# Patient Record
Sex: Female | Born: 1950
Health system: Southern US, Community
[De-identification: ages and names within clinical notes are randomized; demographics above are authoritative.]

## PROBLEM LIST (undated history)

## (undated) DIAGNOSIS — F419 Anxiety disorder, unspecified: Secondary | ICD-10-CM

## (undated) DIAGNOSIS — G7000 Myasthenia gravis without (acute) exacerbation: Secondary | ICD-10-CM

## (undated) DIAGNOSIS — F329 Major depressive disorder, single episode, unspecified: Secondary | ICD-10-CM

## (undated) DIAGNOSIS — F32A Depression, unspecified: Secondary | ICD-10-CM

## (undated) DIAGNOSIS — K219 Gastro-esophageal reflux disease without esophagitis: Secondary | ICD-10-CM

## (undated) DIAGNOSIS — L259 Unspecified contact dermatitis, unspecified cause: Secondary | ICD-10-CM

## (undated) DIAGNOSIS — J449 Chronic obstructive pulmonary disease, unspecified: Secondary | ICD-10-CM

## (undated) DIAGNOSIS — I679 Cerebrovascular disease, unspecified: Secondary | ICD-10-CM

## (undated) DIAGNOSIS — G629 Polyneuropathy, unspecified: Secondary | ICD-10-CM

## (undated) DIAGNOSIS — M810 Age-related osteoporosis without current pathological fracture: Secondary | ICD-10-CM

## (undated) DIAGNOSIS — I639 Cerebral infarction, unspecified: Secondary | ICD-10-CM

## (undated) DIAGNOSIS — G20 Parkinson's disease: Secondary | ICD-10-CM

## (undated) DIAGNOSIS — G8929 Other chronic pain: Secondary | ICD-10-CM

## (undated) DIAGNOSIS — E785 Hyperlipidemia, unspecified: Secondary | ICD-10-CM

## (undated) DIAGNOSIS — H269 Unspecified cataract: Secondary | ICD-10-CM

## (undated) DIAGNOSIS — E559 Vitamin D deficiency, unspecified: Secondary | ICD-10-CM

## (undated) DIAGNOSIS — K5790 Diverticulosis of intestine, part unspecified, without perforation or abscess without bleeding: Secondary | ICD-10-CM

## (undated) HISTORY — DX: Age-related osteoporosis without current pathological fracture: M81.0

## (undated) HISTORY — DX: Other chronic pain: G89.29

## (undated) HISTORY — DX: Vitamin D deficiency, unspecified: E55.9

## (undated) HISTORY — DX: Anxiety disorder, unspecified: F41.9

## (undated) HISTORY — DX: Cerebrovascular disease, unspecified: I67.9

## (undated) HISTORY — DX: Unspecified cataract: H26.9

## (undated) HISTORY — DX: Gastro-esophageal reflux disease without esophagitis: K21.9

## (undated) HISTORY — DX: Hyperlipidemia, unspecified: E78.5

## (undated) HISTORY — DX: Major depressive disorder, single episode, unspecified: F32.9

## (undated) HISTORY — DX: Chronic obstructive pulmonary disease, unspecified: J44.9

## (undated) HISTORY — DX: Polyneuropathy, unspecified: G62.9

## (undated) HISTORY — PX: CERVICAL CONE BIOPSY: SUR198

## (undated) HISTORY — DX: Diverticulosis of intestine, part unspecified, without perforation or abscess without bleeding: K57.90

## (undated) HISTORY — DX: Myasthenia gravis without (acute) exacerbation: G70.00

## (undated) HISTORY — DX: Depression, unspecified: F32.A

---

## 1997-04-10 HISTORY — PX: OTHER SURGICAL HISTORY: SHX169

## 1998-04-10 HISTORY — PX: ABDOMINAL HYSTERECTOMY: SHX81

## 2000-01-16 ENCOUNTER — Ambulatory Visit (HOSPITAL_BASED_OUTPATIENT_CLINIC_OR_DEPARTMENT_OTHER): Admission: RE | Admit: 2000-01-16 | Discharge: 2000-01-16 | Payer: Self-pay | Admitting: Orthopedic Surgery

## 2000-11-26 ENCOUNTER — Ambulatory Visit (HOSPITAL_BASED_OUTPATIENT_CLINIC_OR_DEPARTMENT_OTHER): Admission: RE | Admit: 2000-11-26 | Discharge: 2000-11-26 | Payer: Self-pay | Admitting: Orthopedic Surgery

## 2001-04-10 HISTORY — PX: ROTATOR CUFF REPAIR: SHX139

## 2002-04-10 HISTORY — PX: OTHER SURGICAL HISTORY: SHX169

## 2004-02-07 ENCOUNTER — Other Ambulatory Visit: Payer: Self-pay

## 2004-02-08 ENCOUNTER — Other Ambulatory Visit: Payer: Self-pay

## 2004-02-08 ENCOUNTER — Inpatient Hospital Stay: Payer: Self-pay

## 2004-02-23 ENCOUNTER — Ambulatory Visit: Payer: Self-pay | Admitting: Pain Medicine

## 2004-03-07 ENCOUNTER — Ambulatory Visit: Payer: Self-pay | Admitting: Pain Medicine

## 2004-03-17 ENCOUNTER — Ambulatory Visit: Payer: Self-pay | Admitting: Pain Medicine

## 2005-09-12 ENCOUNTER — Ambulatory Visit: Payer: Self-pay

## 2005-11-17 ENCOUNTER — Ambulatory Visit: Payer: Self-pay | Admitting: Unknown Physician Specialty

## 2005-12-28 ENCOUNTER — Ambulatory Visit: Payer: Self-pay | Admitting: Unknown Physician Specialty

## 2006-08-22 ENCOUNTER — Ambulatory Visit: Payer: Self-pay | Admitting: Specialist

## 2006-09-02 ENCOUNTER — Emergency Department: Payer: Self-pay | Admitting: Emergency Medicine

## 2006-11-21 ENCOUNTER — Ambulatory Visit: Payer: Self-pay | Admitting: Specialist

## 2007-03-02 ENCOUNTER — Ambulatory Visit: Payer: Self-pay | Admitting: Family Medicine

## 2007-05-09 ENCOUNTER — Ambulatory Visit: Payer: Self-pay | Admitting: Unknown Physician Specialty

## 2008-09-30 ENCOUNTER — Ambulatory Visit: Payer: Self-pay | Admitting: Family Medicine

## 2008-10-13 ENCOUNTER — Ambulatory Visit: Payer: Self-pay | Admitting: Family Medicine

## 2009-10-05 ENCOUNTER — Ambulatory Visit: Payer: Self-pay

## 2009-10-05 LAB — HM MAMMOGRAPHY: HM MAMMO: NEGATIVE

## 2010-01-03 ENCOUNTER — Ambulatory Visit: Payer: Self-pay | Admitting: Family Medicine

## 2010-09-25 ENCOUNTER — Emergency Department: Payer: Self-pay | Admitting: Emergency Medicine

## 2010-10-21 LAB — HM COLONOSCOPY

## 2010-10-31 ENCOUNTER — Ambulatory Visit: Payer: Self-pay | Admitting: Unknown Physician Specialty

## 2010-10-31 DIAGNOSIS — K5790 Diverticulosis of intestine, part unspecified, without perforation or abscess without bleeding: Secondary | ICD-10-CM

## 2010-10-31 HISTORY — DX: Diverticulosis of intestine, part unspecified, without perforation or abscess without bleeding: K57.90

## 2011-01-19 ENCOUNTER — Ambulatory Visit: Payer: Self-pay | Admitting: Family Medicine

## 2012-01-29 ENCOUNTER — Ambulatory Visit: Payer: Self-pay | Admitting: Family Medicine

## 2012-07-01 ENCOUNTER — Ambulatory Visit: Payer: Self-pay | Admitting: Family Medicine

## 2012-07-24 ENCOUNTER — Ambulatory Visit: Payer: Self-pay | Admitting: Family Medicine

## 2012-08-06 ENCOUNTER — Encounter: Payer: Self-pay | Admitting: Family Medicine

## 2012-08-08 ENCOUNTER — Encounter: Payer: Self-pay | Admitting: Family Medicine

## 2012-09-08 ENCOUNTER — Encounter: Payer: Self-pay | Admitting: Family Medicine

## 2012-10-08 ENCOUNTER — Encounter: Payer: Self-pay | Admitting: Family Medicine

## 2012-12-27 DIAGNOSIS — J449 Chronic obstructive pulmonary disease, unspecified: Secondary | ICD-10-CM | POA: Insufficient documentation

## 2012-12-27 LAB — LIPID PANEL
Cholesterol: 134 mg/dL (ref 0–200)
HDL: 22 mg/dL — AB (ref 35–70)
LDL Cholesterol: 79 mg/dL
TRIGLYCERIDES: 166 mg/dL — AB (ref 40–160)

## 2013-01-23 ENCOUNTER — Ambulatory Visit: Payer: Self-pay | Admitting: Family Medicine

## 2013-01-23 LAB — HM DEXA SCAN

## 2013-02-08 DIAGNOSIS — G7000 Myasthenia gravis without (acute) exacerbation: Secondary | ICD-10-CM

## 2013-02-08 HISTORY — DX: Myasthenia gravis without (acute) exacerbation: G70.00

## 2013-05-13 ENCOUNTER — Observation Stay: Payer: Self-pay | Admitting: Internal Medicine

## 2013-05-13 LAB — COMPREHENSIVE METABOLIC PANEL
ALK PHOS: 91 U/L
ALT: 22 U/L (ref 12–78)
AST: 28 U/L (ref 15–37)
Albumin: 3.8 g/dL (ref 3.4–5.0)
Anion Gap: 4 — ABNORMAL LOW (ref 7–16)
BILIRUBIN TOTAL: 0.5 mg/dL (ref 0.2–1.0)
BUN: 6 mg/dL — AB (ref 7–18)
CO2: 28 mmol/L (ref 21–32)
CREATININE: 0.79 mg/dL (ref 0.60–1.30)
Calcium, Total: 8.9 mg/dL (ref 8.5–10.1)
Chloride: 106 mmol/L (ref 98–107)
EGFR (Non-African Amer.): >60
Glucose: 92 mg/dL (ref 65–99)
OSMOLALITY: 273 (ref 275–301)
Potassium: 4.5 mmol/L (ref 3.5–5.1)
Sodium: 138 mmol/L (ref 136–145)
TOTAL PROTEIN: 7.1 g/dL (ref 6.4–8.2)

## 2013-05-13 LAB — CBC WITH DIFFERENTIAL/PLATELET
BASOS PCT: 0.9 %
Basophil #: 0 10*3/uL (ref 0.0–0.1)
EOS ABS: 0.4 10*3/uL (ref 0.0–0.7)
EOS PCT: 7.8 %
HCT: 38.8 % (ref 35.0–47.0)
HGB: 13 g/dL (ref 12.0–16.0)
Lymphocyte #: 2.1 10*3/uL (ref 1.0–3.6)
Lymphocyte %: 38.6 %
MCH: 33.3 pg (ref 26.0–34.0)
MCHC: 33.5 g/dL (ref 32.0–36.0)
MCV: 99 fL (ref 80–100)
Monocyte #: 0.5 x10 3/mm (ref 0.2–0.9)
Monocyte %: 8.5 %
NEUTROS ABS: 2.4 10*3/uL (ref 1.4–6.5)
Neutrophil %: 44.2 %
PLATELETS: 238 10*3/uL (ref 150–440)
RBC: 3.92 10*6/uL (ref 3.80–5.20)
RDW: 14.6 % — ABNORMAL HIGH (ref 11.5–14.5)
WBC: 5.3 10*3/uL (ref 3.6–11.0)

## 2013-05-13 LAB — TROPONIN I: Troponin-I: <0.02 ng/mL

## 2013-05-14 LAB — URINALYSIS, COMPLETE
BILIRUBIN, UR: NEGATIVE
BLOOD: NEGATIVE
Bacteria: NONE SEEN
Glucose,UR: NEGATIVE mg/dL (ref 0–75)
KETONE: NEGATIVE
Leukocyte Esterase: NEGATIVE
Nitrite: NEGATIVE
Ph: 7 (ref 4.5–8.0)
Protein: NEGATIVE
Specific Gravity: 1.005 (ref 1.003–1.030)
Squamous Epithelial: 1
WBC UR: <1 /HPF (ref 0–5)

## 2013-05-14 LAB — LIPID PANEL
CHOLESTEROL: 138 mg/dL (ref 0–200)
HDL Cholesterol: 36 mg/dL — ABNORMAL LOW (ref 40–60)
Ldl Cholesterol, Calc: 85 mg/dL (ref 0–100)
Triglycerides: 84 mg/dL (ref 0–200)
VLDL Cholesterol, Calc: 17 mg/dL (ref 5–40)

## 2013-07-31 DIAGNOSIS — F419 Anxiety disorder, unspecified: Secondary | ICD-10-CM | POA: Insufficient documentation

## 2013-07-31 DIAGNOSIS — E559 Vitamin D deficiency, unspecified: Secondary | ICD-10-CM | POA: Insufficient documentation

## 2013-07-31 DIAGNOSIS — M545 Low back pain, unspecified: Secondary | ICD-10-CM | POA: Insufficient documentation

## 2013-11-12 ENCOUNTER — Ambulatory Visit: Payer: Self-pay | Admitting: Family Medicine

## 2014-01-19 ENCOUNTER — Ambulatory Visit: Payer: Self-pay | Admitting: Neurology

## 2014-07-21 DIAGNOSIS — R634 Abnormal weight loss: Secondary | ICD-10-CM | POA: Diagnosis not present

## 2014-07-21 DIAGNOSIS — M810 Age-related osteoporosis without current pathological fracture: Secondary | ICD-10-CM | POA: Diagnosis not present

## 2014-07-21 DIAGNOSIS — E559 Vitamin D deficiency, unspecified: Secondary | ICD-10-CM | POA: Diagnosis not present

## 2014-07-23 ENCOUNTER — Ambulatory Visit
Admit: 2014-07-23 | Disposition: A | Discharge status: Home / Self Care | Payer: Self-pay | Attending: Family Medicine | Admitting: Family Medicine

## 2014-07-23 DIAGNOSIS — M8580 Other specified disorders of bone density and structure, unspecified site: Secondary | ICD-10-CM | POA: Diagnosis not present

## 2014-07-23 DIAGNOSIS — M549 Dorsalgia, unspecified: Secondary | ICD-10-CM | POA: Diagnosis not present

## 2014-07-23 DIAGNOSIS — R296 Repeated falls: Secondary | ICD-10-CM | POA: Diagnosis not present

## 2014-07-23 DIAGNOSIS — E559 Vitamin D deficiency, unspecified: Secondary | ICD-10-CM | POA: Diagnosis not present

## 2014-07-23 DIAGNOSIS — R634 Abnormal weight loss: Secondary | ICD-10-CM | POA: Diagnosis not present

## 2014-07-23 DIAGNOSIS — M40204 Unspecified kyphosis, thoracic region: Secondary | ICD-10-CM | POA: Diagnosis not present

## 2014-07-23 LAB — BASIC METABOLIC PANEL
BUN: 7 mg/dL (ref 4–21)
CREATININE: 0.7 mg/dL (ref 0.5–1.1)
Glucose: 88 mg/dL
Potassium: 4.2 mmol/L (ref 3.4–5.3)
SODIUM: 140 mmol/L (ref 137–147)

## 2014-07-23 LAB — CBC AND DIFFERENTIAL
HEMATOCRIT: 32 % — AB (ref 36–46)
Hemoglobin: 11.1 g/dL — AB (ref 12.0–16.0)
Platelets: 219 10*3/uL (ref 150–399)
WBC: 3.1 10^3/mL

## 2014-07-23 LAB — TSH: TSH: 1.4 u[IU]/mL (ref 0.41–5.90)

## 2014-07-23 LAB — HEPATIC FUNCTION PANEL
ALT: 5 U/L — AB (ref 7–35)
AST: 10 U/L — AB (ref 13–35)

## 2014-08-01 NOTE — Discharge Summary (Signed)
PATIENT NAME:  Alexandria Rodriguez, Alexandria Rodriguez MR#:  [id number] DATE OF BIRTH:  September 23, 1950  DATE OF ADMISSION:  05/13/2013 DATE OF DISCHARGE:  05/14/2013  ADMITTING PHYSICIAN: Aaron Mose. Hower, MD  DISCHARGING PHYSICIAN: Gladstone Lighter, MD   PRIMARY CARE PHYSICIAN: [place] Caryn Section, MD  [place]: None.   DISCHARGE DIAGNOSES: 1.  Right-sided headache with fascial paresthesias, could be migraine versus cervicalgia. 2.  Osteoporosis.  3.  Depression.  4.  Degenerative disk disease with chronic pain.  5.  Peripheral neuropathy, following with Dr. Jennings Books as an outpatient.  6.  Old lacunar infarct noted on MRI brain.    DISCHARGE MEDICATIONS: 1.  Aspirin 81 mg p.o. daily.  2.  Soma 325 mg p.o. b.i.d.  3.  Prilosec 40 mg p.o. daily.  4.  Diclofenac sodium 50 mg p.o. daily.  5.  Triamcinolone 0.05% topical ointment twice a day as needed.  6.  Naproxen 500 mg p.o. b.i.d.  7.  Lyrica 75 mg p.o. b.i.d.  8.  Bupropion 100 mg extended-release tablet twice a day.  9.  Atorvastatin 20 mg p.o. daily.   DISCHARGE DIET: Regular diet.   DISCHARGE ACTIVITY: As tolerated.    FOLLOWUP INSTRUCTIONS:  Follow up with Dr. Jennings Books in 3 to 4 weeks.   LABORATORY, DIAGNOSTIC AND RADIOLOGICAL DATA PRIOR TO DISCHARGE:  1.  Urinalysis negative for any infection.  2.  WBC 5.3, hemoglobin 13.0, hematocrit 38.8, platelet count 238.  3.  Sodium 138, potassium 4.5, chloride 106, bicarb 28, BUN 6, creatinine 0.79, glucose 92 and calcium of 8.9.  4.  ALT 22, AST 28, alk phos 91 total bili 0.5 and albumin of 3.8.  5.  Troponins negative.  6.  CT of the head without contrast showing no acute intracranial abnormality, old lacunar infarct in left basal ganglia.  7.  LDL cholesterol 85, total cholesterol 138, HDL 36, triglycerides 84.  8.  MRI of the brain showing normal exam, no acute infarction or other findings noted.  9.  Ultrasound Dopplers of carotid arteries showing less than 50% stenosis in  carotid arteries. 10.  Echo Doppler showing LV ejection fraction of 50% to 55%, normal LV systolic function noted.   BRIEF HOSPITAL COURSE: Ms. Akamine is a 64 year old Caucasian female with past medical history significant for multiple musculoskeletal pains including back pain, cervicalgia, peripheral neuropathy on pain medication and muscle relaxants, presented to the hospital secondary to headache, right-sided with right-sided paresthesias of to face.  1.  Headache, could be migraine headache. The patient had a similar headache on the left side a few weeks back, now comes with a right-sided headache and only facial symptoms. She had tingling and numbness along the right side of the face but no arm tingling, numbness over the leg or weakness noted. She has had migraine history in the past but denies any photophobia or nausea at this time. Her CT head showed an old infarct so she was at risk for stroke with her risk factors being her age, hypertension and her smoking. She was admitted to rule out TIA and the MRI of the brain was normal.  Her carotid Dopplers did not show any significant stenosis. Echo was normal. The patient is being discharged on low-dose aspirin and statin in light of her old infarct seen on the CT scan. She follows with Dr. Jennings Books for her neuropathies and was advised to follow up with him in the next 3 to 4 weeks.  2.  Cervicalgia  and degenerative disk disease with musculoskeletal pain. All her home medications have been continued without any changes.  3.  Depression. She is on bupropion.   Her course has been otherwise unremarkable in the hospital.   DISCHARGE CONDITION: Stable.   DISCHARGE DISPOSITION: Home.   TIME SPENT ON DISCHARGE: 45 minutes.   ____________________________ Gladstone Lighter, MD rk:cs D: 05/14/2013 14:16:52 ET T: 05/14/2013 15:48:20 ET JOB#: [id number]  cc: Gladstone Lighter, MD, <Dictator> Hemang K. Manuella Ghazi, MD Gladstone Lighter MD ELECTRONICALLY  SIGNED 05/15/2013 14:59

## 2014-08-01 NOTE — H&P (Signed)
PATIENT NAME:  Alexandria Rodriguez, LOCASCIO MR#:  [id number] DATE OF BIRTH:  17-May-1950  DATE OF ADMISSION:  05/13/2013  PRIMARY CARE PHYSICIAN: Dr. Lelon Huh.   REFERRING PHYSICIAN: Dr. Mariea Clonts.   CHIEF COMPLAINT: Facial droop.   HISTORY OF PRESENT ILLNESS: A 64 year old Caucasian female with past medical history of depression as well as tobacco abuse, presenting with headache and facial droop. Describes acute onset around 3 p.m. of right-sided facial droop with associated right-sided weakness and expressive aphasia. She also noted having a headache at that time, right frontal in location, throbbing, nonradiating to the neck. No worsening or relieving factors. No associated vision changes. Upon arrival to the Emergency Department, her symptoms as far as facial droop have resolved as well as her aphasia; however, she is still complaining of mild right-sided weakness, though improving. Of note, she does complain of daily headaches but states this one was somewhat more severe than usual. Currently complaining only of headache.   REVIEW OF SYSTEMS:  CONSTITUTIONAL: Denies fever, chills, fatigue, EYES: Denies blurry vision, double vision, eye pain.  EARS, NOSE, THROAT: Denies tinnitus, ear pain, hearing loss.  RESPIRATORY: Denies cough, wheeze, shortness of breath.  CARDIOVASCULAR: Denies chest pain, palpitations, edema.  GENITOURINARY: Denies nausea, vomiting, diarrhea or abdominal pain.  GENITOURINARY: Denies dysuria or hematuria.  ENDOCRINE: Denies nocturia or thyroid problems.  HEMATOLOGIC AND LYMPHATIC: Denies easy bruising, bleeding.  SKIN: Denies rash or lesions.  MUSCULOSKELETAL: Positive for pain in neck which is chronic. Denies back, shoulder, knee, hip pain or arthritic symptoms.  NEUROLOGIC: Positive for right-sided weakness as described above as well as expressive aphasia, though now resolved. Positive for tremor which is chronic.  PSYCHIATRIC: Denies anxiety or depressive symptoms.    Otherwise, full review of systems performed by me is negative.   PAST MEDICAL HISTORY: Osteoporosis, depression, as well as various orthopedic measures, including back surgery with cervical fusion, surgeries of both shoulders bilaterally.   SOCIAL HISTORY: Every day smoker. Denies any alcohol usage.   FAMILY HISTORY: Positive for CVAs, coronary artery disease and colon cancer.   ALLERGIES: ERYTHROMYCIN, LIDODERM, MORPHINE, SULFA DRUGS AND ULTRAM.    HOME MEDICATIONS: Include diclofenac 50 mg p.o. daily, naproxen 500 mg p.o. b.i.d., Lyrica 75 mg p.o. b.i.d., triamcinolone topical ointment 0.05% to affected areas b.i.d., carisoprodol 350 mg p.o. b.i.d., Prilosec 40 mg p.o. daily, Wellbutrin 100 mg p.o. b.i.d.   PHYSICAL EXAMINATION:  VITAL SIGNS: Temperature 98.2, heart rate 86, respirations 20, blood pressure 165/75, saturating 96% on room air. Weight 59 kg, BMI 23.8.  GENERAL: Well-nourished, well-developed, Caucasian female, currently in no acute distress.  HEAD: Normocephalic, atraumatic.  EYES: Pupils equal, round and reactive to light. Extraocular muscles intact. No scleral icterus.  MOUTH: Moist mucosal membranes. Dentition intact. No abscess noted.  EARS, NOSE, THROAT: Throat is clear without exudates. No external lesions.  NECK: Supple. No thyromegaly. No nodules. No JVD.  PULMONARY: Clear to auscultation bilaterally with no wheezes, rubs, rhonchi. No use of accessory muscles. Good respiratory effort.  CHEST: Nontender to palpation.  CARDIOVASCULAR: S1, S2, regular rate and rhythm. No murmurs, rubs or gallops. No edema. Pedal pulses 2+ bilaterally.  GASTROINTESTINAL: Soft, nontender, nondistended. No masses. Positive bowel sounds. No hepatosplenomegaly.  MUSCULOSKELETAL: No swelling, clubbing, edema. Range of motion full in all extremities.  NEUROLOGIC: Cranial nerves II through XII intact. Sensation intact. Reflexes intact; however, strength is abnormal. Right hand grip 4 out of  5 in comparison to the left as well as proximal right upper extremity  extension 4 out of 5 when compared to the left. Right lower extremity 4 out of 5 in proximal flexion as well as dorsiflexion. Pronator drift within normal limits. The patient does have an intention tremor which is chronic, most prominent on the left side.  SKIN: No ulcerations, lesions, rash, cyanosis. Skin warm, dry. Turgor intact.  PSYCHIATRIC: Mood and affect within normal limits. The patient is awake, alert and oriented x 3. Insight and judgment intact.   LABORATORY DATA: Sodium 138, potassium 4.5, chloride 106, bicarb 28, BUN 6, creatinine 0.79, glucose 92. LFTs within normal limits. Troponin I less than 0.02. WBC 5.3, hemoglobin 13, platelets 238. CT head performed revealing no acute intracranial process. Possible old lacunar infarct in left basal ganglia. EKG performed, revealing normal sinus rhythm, heart rate 82.   ASSESSMENT AND PLAN: A 64 year old female with history of depression, tobacco abuse, presenting with headache and facial droop.  1. Transient ischemic attack: Neurology checks q.4 hours. Already received aspirin. Will provide statin therapy. Check MRI, transthoracic echocardiogram. Admit to telemetry under observation. Check carotid Dopplers as well. Allow permissive hypertension, treating only if systolic blood pressure greater than 710, diastolic greater than 626 or symptomatic.  2. Gastroesophageal reflux disease: Continue with proton pump inhibitor therapy.  3. Depression: Continue bupropion.  4. Venous thromboembolism prophylaxis with sequential compression devices.   The patient is FULL CODE.   TIME SPENT: 45 minutes.    ____________________________ Aaron Mose. Hower, MD dkh:gb D: 05/13/2013 22:49:04 ET T: 05/13/2013 23:23:01 ET JOB#: [id number]  cc: Aaron Mose. Hower, MD, <Dictator> DAVID Woodfin Ganja MD ELECTRONICALLY SIGNED 05/14/2013 3:00

## 2014-08-07 DIAGNOSIS — S22060A Wedge compression fracture of T7-T8 vertebra, initial encounter for closed fracture: Secondary | ICD-10-CM | POA: Diagnosis not present

## 2014-08-15 DIAGNOSIS — M546 Pain in thoracic spine: Secondary | ICD-10-CM | POA: Diagnosis not present

## 2014-08-21 DIAGNOSIS — M546 Pain in thoracic spine: Secondary | ICD-10-CM | POA: Diagnosis not present

## 2014-09-25 ENCOUNTER — Other Ambulatory Visit: Payer: Self-pay | Admitting: Unknown Physician Specialty

## 2014-09-25 DIAGNOSIS — R1310 Dysphagia, unspecified: Secondary | ICD-10-CM

## 2014-09-29 ENCOUNTER — Ambulatory Visit
Admission: RE | Admit: 2014-09-29 | Discharge: 2014-09-29 | Disposition: A | Discharge status: Home / Self Care | Payer: Medicare Other | Source: Ambulatory Visit | Attending: Unknown Physician Specialty | Admitting: Unknown Physician Specialty

## 2014-09-29 DIAGNOSIS — K222 Esophageal obstruction: Secondary | ICD-10-CM | POA: Insufficient documentation

## 2014-09-29 DIAGNOSIS — R1310 Dysphagia, unspecified: Secondary | ICD-10-CM

## 2014-09-29 DIAGNOSIS — K219 Gastro-esophageal reflux disease without esophagitis: Secondary | ICD-10-CM | POA: Diagnosis not present

## 2014-10-02 DIAGNOSIS — R933 Abnormal findings on diagnostic imaging of other parts of digestive tract: Secondary | ICD-10-CM | POA: Insufficient documentation

## 2014-10-02 DIAGNOSIS — R1310 Dysphagia, unspecified: Secondary | ICD-10-CM | POA: Insufficient documentation

## 2014-10-05 ENCOUNTER — Ambulatory Visit: Payer: Medicare Other | Admitting: Anesthesiology

## 2014-10-05 ENCOUNTER — Ambulatory Visit
Admission: RE | Admit: 2014-10-05 | Discharge: 2014-10-05 | Disposition: A | Discharge status: Home / Self Care | Payer: Medicare Other | Source: Ambulatory Visit | Attending: Unknown Physician Specialty | Admitting: Unknown Physician Specialty

## 2014-10-05 ENCOUNTER — Encounter
Admission: RE | Disposition: A | Discharge status: Home / Self Care | Payer: Self-pay | Source: Ambulatory Visit | Attending: Unknown Physician Specialty

## 2014-10-05 DIAGNOSIS — J449 Chronic obstructive pulmonary disease, unspecified: Secondary | ICD-10-CM | POA: Diagnosis not present

## 2014-10-05 DIAGNOSIS — F17210 Nicotine dependence, cigarettes, uncomplicated: Secondary | ICD-10-CM | POA: Insufficient documentation

## 2014-10-05 DIAGNOSIS — Z79899 Other long term (current) drug therapy: Secondary | ICD-10-CM | POA: Insufficient documentation

## 2014-10-05 DIAGNOSIS — D649 Anemia, unspecified: Secondary | ICD-10-CM | POA: Diagnosis not present

## 2014-10-05 DIAGNOSIS — R1310 Dysphagia, unspecified: Secondary | ICD-10-CM | POA: Insufficient documentation

## 2014-10-05 DIAGNOSIS — I10 Essential (primary) hypertension: Secondary | ICD-10-CM | POA: Diagnosis not present

## 2014-10-05 HISTORY — PX: ESOPHAGOGASTRODUODENOSCOPY: SHX5428

## 2014-10-05 SURGERY — EGD (ESOPHAGOGASTRODUODENOSCOPY)
Anesthesia: General

## 2014-10-05 MED ORDER — LIDOCAINE HCL (CARDIAC) 20 MG/ML IV SOLN
INTRAVENOUS | Status: DC | PRN
  Administered 2014-10-05: 100 mg via INTRAVENOUS

## 2014-10-05 MED ORDER — PROPOFOL 10 MG/ML IV BOLUS
INTRAVENOUS | Status: DC | PRN
  Administered 2014-10-05: 30 mg via INTRAVENOUS
  Administered 2014-10-05: 20 mg via INTRAVENOUS
  Administered 2014-10-05: 60 mg via INTRAVENOUS

## 2014-10-05 MED ORDER — SODIUM CHLORIDE 0.9 % IV SOLN
INTRAVENOUS | Status: DC
  Administered 2014-10-05: 1000 mL via INTRAVENOUS

## 2014-10-05 MED ORDER — FENTANYL CITRATE (PF) 100 MCG/2ML IJ SOLN
INTRAMUSCULAR | Status: DC | PRN
  Administered 2014-10-05 (×2): 50 ug via INTRAVENOUS

## 2014-10-05 MED ORDER — BUTAMBEN-TETRACAINE-BENZOCAINE 2-2-14 % EX AERO
INHALATION_SPRAY | CUTANEOUS | Status: DC | PRN
  Administered 2014-10-05: 1 via TOPICAL

## 2014-10-05 MED ORDER — MIDAZOLAM HCL 5 MG/5ML IJ SOLN
INTRAMUSCULAR | Status: DC | PRN
  Administered 2014-10-05: 1 mg via INTRAVENOUS

## 2014-10-05 NOTE — Anesthesia Preprocedure Evaluation (Signed)
Anesthesia Evaluation  Patient identified by MRN, date of birth, ID band Patient awake    Reviewed: Allergy & Precautions, NPO status , Patient's Chart, lab work & pertinent test results  History of Anesthesia Complications Negative for: history of anesthetic complications  Airway Mallampati: II       Dental  (+) Edentulous Upper, Missing   Pulmonary COPDCurrent Smoker,  + rhonchi         Cardiovascular hypertension, Rhythm:Regular     Neuro/Psych CVA negative psych ROS   GI/Hepatic negative GI ROS, Neg liver ROS,   Endo/Other    Renal/GU negative Renal ROS  negative genitourinary   Musculoskeletal negative musculoskeletal ROS (+)   Abdominal Normal abdominal exam  (+)   Peds  Hematology  (+) anemia ,   Anesthesia Other Findings   Reproductive/Obstetrics negative OB ROS                             Anesthesia Physical Anesthesia Plan  ASA: III  Anesthesia Plan: General   Post-op Pain Management:    Induction: Intravenous  Airway Management Planned: Nasal Cannula  Additional Equipment:   Intra-op Plan:   Post-operative Plan:   Informed Consent: I have reviewed the patients History and Physical, chart, labs and discussed the procedure including the risks, benefits and alternatives for the proposed anesthesia with the patient or authorized representative who has indicated his/her understanding and acceptance.     Plan Discussed with: CRNA  Anesthesia Plan Comments:         Anesthesia Quick Evaluation

## 2014-10-05 NOTE — Op Note (Signed)
[facility] Patient Name: Alexandria Rodriguez Procedure Date: 10/05/2014 5:13 PM MRN: [id number] Account #: [id number] Date of Birth: 07-16-1950 Admit Type: Outpatient Age: 64 Room: [facility] ENDO ROOM 1 Gender: Female Note Status: Finalized Procedure:         Upper GI endoscopy Indications:       Dysphagia Providers:         Manya Silvas, MD Referring MD:      Kirstie Peri. Caryn Section, MD (Referring MD) Medicines:         Propofol per Anesthesia Complications:     No immediate complications. Procedure:         Pre-Anesthesia Assessment:                    - After reviewing the risks and benefits, the patient was                     deemed in satisfactory condition to undergo the procedure.                    After obtaining informed consent, the endoscope was passed                     under direct vision. Throughout the procedure, the                     patient's blood pressure, pulse, and oxygen saturations                     were monitored continuously. The Olympus GIF-160 endoscope                     (S#. [id number]) was introduced through the mouth, and                     advanced to the second part of duodenum. The upper GI                     endoscopy was accomplished without difficulty. The patient                     tolerated the procedure well. Findings:      Very litttle if any motillity was seen in the esophagus.      The examined esophagus was normal. There was questionable narrowing of       the distal esophagus but the scope went through without difficultlly but       it is a small scope. Due to abnorml barium swallow t the end of the exam       A guidewire was placed and the scope was withdrawn. Dilation was       performed with a Savary dilator with mild resistance at 15 mm and 16 mm.      The entire examined stomach was normal.      The examined duodenum was normal. Impression:        - Normal esophagus. Dilated.                    -  Normal stomach.                    - Normal examined duodenum.                    - No specimens collected. Recommendation:    -  soft food for 3 days, eat slowly, chew well, take small                     bites Manya Silvas, MD 10/05/2014 6:26:41 PM This report has been signed electronically. Number of Addenda: 0 Note Initiated On: 10/05/2014 5:13 PM      [facility]

## 2014-10-05 NOTE — Transfer of Care (Signed)
Immediate Anesthesia Transfer of Care Note  Patient: Alexandria Rodriguez  Procedure(s) Performed: Procedure(s): ESOPHAGOGASTRODUODENOSCOPY (EGD) (N/A)  Patient Location: PACU  Anesthesia Type:General  Level of Consciousness: sedated  Airway & Oxygen Therapy: Patient Spontanous Breathing and Patient connected to nasal cannula oxygen  Post-op Assessment: Report given to RN and Post -op Vital signs reviewed and stable  Post vital signs: Reviewed and stable  Last Vitals:  Filed Vitals:   10/05/14 1826  BP: 97/55  Pulse:   Temp: 36.3 C  Resp: 10    Complications: No apparent anesthesia complications

## 2014-10-05 NOTE — H&P (Signed)
Primary Care Physician:  Lelon Huh, MD Primary Gastroenterologist:  Dr. Vira Agar  Pre-Procedure History & Physical: HPI:  Alexandria Rodriguez is a 64 y.o. female is here for an endoscopy.   No past medical history on file.  No past surgical history on file.  Prior to Admission medications   Medication Sig Start Date End Date Taking? Authorizing Provider  atorvastatin (LIPITOR) 20 MG tablet Take 20 mg by mouth daily.   Yes Historical Provider, MD  buPROPion (WELLBUTRIN SR) 150 MG 12 hr tablet Take 150 mg by mouth 2 (two) times daily.   Yes Historical Provider, MD  carbidopa-levodopa (SINEMET IR) 25-100 MG per tablet Take 1 tablet by mouth 3 (three) times daily.   Yes Historical Provider, MD  carisoprodol (SOMA) 350 MG tablet Take 350 mg by mouth 4 (four) times daily as needed for muscle spasms.   Yes Historical Provider, MD  omeprazole (PRILOSEC) 40 MG capsule Take 40 mg by mouth daily.   Yes Historical Provider, MD  pregabalin (LYRICA) 75 MG capsule Take 75 mg by mouth 2 (two) times daily.   Yes Historical Provider, MD  primidone (MYSOLINE) 250 MG tablet Take 250 mg by mouth 4 (four) times daily.   Yes Historical Provider, MD    Allergies as of 10/02/2014  . (Not on File)    No family history on file.  History   Social History  . Marital Status: Married    Spouse Name: N/A  . Number of Children: N/A  . Years of Education: N/A   Occupational History  . Not on file.   Social History Main Topics  . Smoking status: Not on file  . Smokeless tobacco: Not on file  . Alcohol Use: Not on file  . Drug Use: Not on file  . Sexual Activity: Not on file   Other Topics Concern  . Not on file   Social History Narrative  . No narrative on file    Review of Systems: See HPI, otherwise negative ROS  Physical Exam: BP 100/60 mmHg  Pulse 68  Temp(Src) 96.8 F (36 C) (Tympanic)  Resp 17  Ht 5\' 2"  (1.575 m)  Wt 45.36 kg (100 lb)  BMI 18.29 kg/m2  SpO2 100% General:   Alert,   pleasant and cooperative in NAD Head:  Normocephalic and atraumatic. Neck:  Supple; no masses or thyromegaly. Lungs:  Clear throughout to auscultation.    Heart:  Regular rate and rhythm. Abdomen:  Soft, nontender and nondistended. Normal bowel sounds, without guarding, and without rebound.   Neurologic:  Alert and  oriented x4;  grossly normal neurologically.  Impression/Plan: Alexandria Rodriguez is here for an endoscopy to be performed for dysphagia  Risks, benefits, limitations, and alternatives regarding  endoscopy have been reviewed with the patient.  Questions have been answered.  All parties agreeable.   Gaylyn Cheers, MD  10/05/2014, 6:21 PM   Primary Care Physician:  Lelon Huh, MD Primary Gastroenterologist:  Dr. Vira Agar  Pre-Procedure History & Physical: HPI:  Alexandria Rodriguez is a 64 y.o. female is here for an endoscopy.   No past medical history on file.  No past surgical history on file.  Prior to Admission medications   Medication Sig Start Date End Date Taking? Authorizing Provider  atorvastatin (LIPITOR) 20 MG tablet Take 20 mg by mouth daily.   Yes Historical Provider, MD  buPROPion (WELLBUTRIN SR) 150 MG 12 hr tablet Take 150 mg by mouth 2 (two) times daily.  Yes Historical Provider, MD  carbidopa-levodopa (SINEMET IR) 25-100 MG per tablet Take 1 tablet by mouth 3 (three) times daily.   Yes Historical Provider, MD  carisoprodol (SOMA) 350 MG tablet Take 350 mg by mouth 4 (four) times daily as needed for muscle spasms.   Yes Historical Provider, MD  omeprazole (PRILOSEC) 40 MG capsule Take 40 mg by mouth daily.   Yes Historical Provider, MD  pregabalin (LYRICA) 75 MG capsule Take 75 mg by mouth 2 (two) times daily.   Yes Historical Provider, MD  primidone (MYSOLINE) 250 MG tablet Take 250 mg by mouth 4 (four) times daily.   Yes Historical Provider, MD    Allergies as of 10/02/2014  . (Not on File)    No family history on file.  History   Social History  .  Marital Status: Married    Spouse Name: N/A  . Number of Children: N/A  . Years of Education: N/A   Occupational History  . Not on file.   Social History Main Topics  . Smoking status: Not on file  . Smokeless tobacco: Not on file  . Alcohol Use: Not on file  . Drug Use: Not on file  . Sexual Activity: Not on file   Other Topics Concern  . Not on file   Social History Narrative  . No narrative on file    Review of Systems: See HPI, otherwise negative ROS  Physical Exam: BP 100/60 mmHg  Pulse 68  Temp(Src) 96.8 F (36 C) (Tympanic)  Resp 17  Ht 5\' 2"  (1.575 m)  Wt 45.36 kg (100 lb)  BMI 18.29 kg/m2  SpO2 100% General:   Alert,  pleasant and cooperative in NAD Head:  Normocephalic and atraumatic. Neck:  Supple; no masses or thyromegaly. Lungs:  Clear throughout to auscultation.    Heart:  Regular rate and rhythm. Abdomen:  Soft, nontender and nondistended. Normal bowel sounds, without guarding, and without rebound.   Neurologic:  Alert and  oriented x4;  grossly normal neurologically.  Impression/Plan: Alexandria Rodriguez is here for an endoscopy to be performed for dysphagia  Risks, benefits, limitations, and alternatives regarding  endoscopy have been reviewed with the patient.  Questions have been answered.  All parties agreeable.   Gaylyn Cheers, MD  10/05/2014, 6:21 PM

## 2014-10-06 NOTE — Anesthesia Postprocedure Evaluation (Signed)
  Anesthesia Post-op Note  Patient: Alexandria Rodriguez  Procedure(s) Performed: Procedure(s): ESOPHAGOGASTRODUODENOSCOPY (EGD) (N/A)  Anesthesia type:General  Patient location: PACU  Post pain: Pain level controlled  Post assessment: Post-op Vital signs reviewed, Patient's Cardiovascular Status Stable, Respiratory Function Stable, Patent Airway and No signs of Nausea or vomiting  Post vital signs: Reviewed and stable  Last Vitals:  Filed Vitals:   10/05/14 1900  BP: 121/70  Pulse: 77  Temp:   Resp: 21    Level of consciousness: awake, alert  and patient cooperative  Complications: No apparent anesthesia complications

## 2014-10-07 ENCOUNTER — Encounter: Payer: Self-pay | Admitting: Unknown Physician Specialty

## 2014-10-19 NOTE — Addendum Note (Signed)
Addendum  created 10/19/14 0925 by Iver Nestle, MD   Modules edited: Anesthesia Attestations

## 2014-10-22 ENCOUNTER — Telehealth: Payer: Self-pay | Admitting: Family Medicine

## 2014-10-22 NOTE — Telephone Encounter (Signed)
Pt went to see her neurologist and he suggested that she take a b12 injection weekly.  Her insurance suggested that she should get it from her primary care doctor.  Can you give her these injections.  Her call back [phone]

## 2014-10-25 NOTE — Telephone Encounter (Signed)
We have no documentation from her neurologist regarding medical necessity  For B12 injections. She will need to get her neurologist send notes stating the medical reason for this.

## 2014-10-26 NOTE — Telephone Encounter (Signed)
Patient notified. Patient stated that notes should be faxed over.

## 2014-11-03 DIAGNOSIS — G629 Polyneuropathy, unspecified: Secondary | ICD-10-CM | POA: Insufficient documentation

## 2014-11-04 ENCOUNTER — Encounter: Payer: Self-pay | Admitting: Family Medicine

## 2014-11-04 ENCOUNTER — Other Ambulatory Visit: Payer: Self-pay | Admitting: Family Medicine

## 2014-11-04 DIAGNOSIS — E538 Deficiency of other specified B group vitamins: Secondary | ICD-10-CM | POA: Insufficient documentation

## 2014-11-04 NOTE — Telephone Encounter (Signed)
Last office visit was on 09/29/14

## 2014-11-12 ENCOUNTER — Encounter: Payer: Self-pay | Admitting: Family Medicine

## 2014-11-18 ENCOUNTER — Telehealth: Payer: Self-pay | Admitting: Family Medicine

## 2014-11-18 NOTE — Telephone Encounter (Signed)
Can start B12 injections 1 gram SQ, anytime whithin the next week, she should have weekly injections for 1 month, then monthly injections.

## 2014-11-18 NOTE — Telephone Encounter (Signed)
Please advise? Notes concerning need for b-12 injections has been scanned into chart.

## 2014-11-18 NOTE — Telephone Encounter (Signed)
Patient notified. Patient will call back to schedule appt for injections.

## 2014-11-18 NOTE — Telephone Encounter (Signed)
Pt is calling questioning about her B12 injections?  And when is she suppose to start.  Alexandria Rodriguez

## 2014-11-20 ENCOUNTER — Ambulatory Visit: Payer: Self-pay | Admitting: Family Medicine

## 2014-11-20 ENCOUNTER — Ambulatory Visit (INDEPENDENT_AMBULATORY_CARE_PROVIDER_SITE_OTHER): Payer: Medicare Other

## 2014-11-20 DIAGNOSIS — E538 Deficiency of other specified B group vitamins: Secondary | ICD-10-CM

## 2014-11-20 MED ORDER — CYANOCOBALAMIN 1000 MCG/ML IJ SOLN
1000.0000 ug | Freq: Once | INTRAMUSCULAR | Status: AC
  Administered 2014-11-20: 1000 ug via INTRAMUSCULAR

## 2014-11-20 NOTE — Progress Notes (Signed)
Patient received Vitamin B12 injection today in the office per Dr. Caryn Section.  She has been given an appointment in 1 week to follow up with her second of 4 weekly injections.  She was instructed to check with Dr. Caryn Section on Monday to make sure this is the schedule he wanted.

## 2014-11-27 ENCOUNTER — Ambulatory Visit (INDEPENDENT_AMBULATORY_CARE_PROVIDER_SITE_OTHER): Payer: Medicare Other

## 2014-11-27 DIAGNOSIS — E538 Deficiency of other specified B group vitamins: Secondary | ICD-10-CM | POA: Diagnosis not present

## 2014-11-27 MED ORDER — CYANOCOBALAMIN 1000 MCG/ML IJ SOLN
1000.0000 ug | Freq: Once | INTRAMUSCULAR | Status: AC
  Administered 2014-11-27: 1000 ug via INTRAMUSCULAR

## 2014-11-27 NOTE — Progress Notes (Signed)
Vitamin B 12 injection today at office. Tolerated well. This is her 2nd weekly B 12 injection of the month and then it will be monthly per MD's order. Will come back in a week.

## 2014-12-04 ENCOUNTER — Ambulatory Visit (INDEPENDENT_AMBULATORY_CARE_PROVIDER_SITE_OTHER): Payer: Medicare Other

## 2014-12-04 DIAGNOSIS — Z86010 Personal history of colonic polyps: Secondary | ICD-10-CM | POA: Insufficient documentation

## 2014-12-04 DIAGNOSIS — E538 Deficiency of other specified B group vitamins: Secondary | ICD-10-CM | POA: Diagnosis not present

## 2014-12-04 DIAGNOSIS — M810 Age-related osteoporosis without current pathological fracture: Secondary | ICD-10-CM | POA: Insufficient documentation

## 2014-12-04 MED ORDER — CYANOCOBALAMIN 1000 MCG/ML IJ SOLN
1000.0000 ug | Freq: Once | INTRAMUSCULAR | Status: AC
  Administered 2014-12-04: 1000 ug via INTRAMUSCULAR

## 2014-12-04 NOTE — Progress Notes (Signed)
Vitamin B 12 injection at office today. 3rd B 12 injection for Q weeks for 4 weeks. Tolerated well. Will come back in 1 week per MD's order.

## 2014-12-09 ENCOUNTER — Other Ambulatory Visit: Payer: Self-pay | Admitting: Family Medicine

## 2014-12-11 ENCOUNTER — Ambulatory Visit (INDEPENDENT_AMBULATORY_CARE_PROVIDER_SITE_OTHER): Payer: Medicare Other

## 2014-12-11 DIAGNOSIS — E538 Deficiency of other specified B group vitamins: Secondary | ICD-10-CM | POA: Diagnosis not present

## 2014-12-11 MED ORDER — CYANOCOBALAMIN 1000 MCG/ML IJ SOLN
1000.0000 ug | Freq: Once | INTRAMUSCULAR | Status: AC
  Administered 2014-12-11: 1000 ug via INTRAMUSCULAR

## 2014-12-11 NOTE — Progress Notes (Signed)
B 12 injection today at office. 4 rd injection for Q week, then Q month. Tolerated well. Will come back in 4 weeks per MD's order.

## 2014-12-15 ENCOUNTER — Encounter: Payer: Self-pay | Admitting: Family Medicine

## 2014-12-22 DIAGNOSIS — I679 Cerebrovascular disease, unspecified: Secondary | ICD-10-CM | POA: Insufficient documentation

## 2014-12-22 DIAGNOSIS — M5412 Radiculopathy, cervical region: Secondary | ICD-10-CM | POA: Insufficient documentation

## 2014-12-22 DIAGNOSIS — Z720 Tobacco use: Secondary | ICD-10-CM | POA: Insufficient documentation

## 2014-12-22 DIAGNOSIS — R251 Tremor, unspecified: Secondary | ICD-10-CM | POA: Insufficient documentation

## 2014-12-22 DIAGNOSIS — R634 Abnormal weight loss: Secondary | ICD-10-CM

## 2014-12-22 DIAGNOSIS — F32A Depression, unspecified: Secondary | ICD-10-CM | POA: Insufficient documentation

## 2014-12-22 DIAGNOSIS — K5790 Diverticulosis of intestine, part unspecified, without perforation or abscess without bleeding: Secondary | ICD-10-CM | POA: Insufficient documentation

## 2014-12-22 DIAGNOSIS — R0600 Dyspnea, unspecified: Secondary | ICD-10-CM | POA: Insufficient documentation

## 2014-12-22 DIAGNOSIS — F329 Major depressive disorder, single episode, unspecified: Secondary | ICD-10-CM | POA: Insufficient documentation

## 2014-12-22 DIAGNOSIS — N6019 Diffuse cystic mastopathy of unspecified breast: Secondary | ICD-10-CM

## 2014-12-22 DIAGNOSIS — K219 Gastro-esophageal reflux disease without esophagitis: Secondary | ICD-10-CM | POA: Insufficient documentation

## 2014-12-22 DIAGNOSIS — M26609 Unspecified temporomandibular joint disorder, unspecified side: Secondary | ICD-10-CM

## 2014-12-22 DIAGNOSIS — J449 Chronic obstructive pulmonary disease, unspecified: Secondary | ICD-10-CM

## 2014-12-22 DIAGNOSIS — F17210 Nicotine dependence, cigarettes, uncomplicated: Secondary | ICD-10-CM | POA: Insufficient documentation

## 2015-01-04 ENCOUNTER — Encounter: Payer: Medicare Other | Admitting: Family Medicine

## 2015-01-08 ENCOUNTER — Ambulatory Visit: Payer: Self-pay

## 2015-01-19 ENCOUNTER — Other Ambulatory Visit: Payer: Self-pay | Admitting: Family Medicine

## 2015-01-31 ENCOUNTER — Other Ambulatory Visit: Payer: Self-pay | Admitting: Family Medicine

## 2015-02-01 ENCOUNTER — Emergency Department: Payer: Medicare Other

## 2015-02-01 ENCOUNTER — Emergency Department
Admission: EM | Admit: 2015-02-01 | Discharge: 2015-02-01 | Disposition: A | Discharge status: Home / Self Care | Payer: Medicare Other | Attending: Emergency Medicine | Admitting: Emergency Medicine

## 2015-02-01 DIAGNOSIS — Z791 Long term (current) use of non-steroidal anti-inflammatories (NSAID): Secondary | ICD-10-CM | POA: Diagnosis not present

## 2015-02-01 DIAGNOSIS — Z79899 Other long term (current) drug therapy: Secondary | ICD-10-CM | POA: Insufficient documentation

## 2015-02-01 DIAGNOSIS — Z720 Tobacco use: Secondary | ICD-10-CM | POA: Diagnosis not present

## 2015-02-01 DIAGNOSIS — K625 Hemorrhage of anus and rectum: Secondary | ICD-10-CM | POA: Diagnosis present

## 2015-02-01 DIAGNOSIS — K529 Noninfective gastroenteritis and colitis, unspecified: Secondary | ICD-10-CM | POA: Insufficient documentation

## 2015-02-01 LAB — COMPREHENSIVE METABOLIC PANEL
ALBUMIN: 3.6 g/dL (ref 3.5–5.0)
ALK PHOS: 88 U/L (ref 38–126)
AST: 10 U/L — ABNORMAL LOW (ref 15–41)
Anion gap: 8 (ref 5–15)
BUN: 9 mg/dL (ref 6–20)
CHLORIDE: 102 mmol/L (ref 101–111)
CO2: 28 mmol/L (ref 22–32)
CREATININE: 0.95 mg/dL (ref 0.44–1.00)
Calcium: 8.5 mg/dL — ABNORMAL LOW (ref 8.9–10.3)
GFR calc Af Amer: >60 mL/min (ref >60)
GFR calc non Af Amer: >60 mL/min (ref >60)
GLUCOSE: 88 mg/dL (ref 65–99)
Potassium: 3.6 mmol/L (ref 3.5–5.1)
SODIUM: 138 mmol/L (ref 135–145)
Total Bilirubin: 0.7 mg/dL (ref 0.3–1.2)
Total Protein: 6.4 g/dL — ABNORMAL LOW (ref 6.5–8.1)

## 2015-02-01 LAB — CBC
HCT: 34.2 % — ABNORMAL LOW (ref 35.0–47.0)
HEMOGLOBIN: 11.8 g/dL — AB (ref 12.0–16.0)
MCH: 36.8 pg — AB (ref 26.0–34.0)
MCHC: 34.6 g/dL (ref 32.0–36.0)
MCV: 106.5 fL — ABNORMAL HIGH (ref 80.0–100.0)
PLATELETS: 171 10*3/uL (ref 150–440)
RBC: 3.22 MIL/uL — ABNORMAL LOW (ref 3.80–5.20)
RDW: 14 % (ref 11.5–14.5)
WBC: 5 10*3/uL (ref 3.6–11.0)

## 2015-02-01 MED ORDER — CIPROFLOXACIN HCL 500 MG PO TABS: 500.0000 mg | ORAL_TABLET | Freq: Two times a day (BID) | ORAL | Status: AC

## 2015-02-01 MED ORDER — ONDANSETRON HCL 4 MG/2ML IJ SOLN
4.0000 mg | Freq: Once | INTRAMUSCULAR | Status: AC
  Administered 2015-02-01: 4 mg via INTRAVENOUS
  Filled 2015-02-01: qty 2

## 2015-02-01 MED ORDER — METRONIDAZOLE 500 MG PO TABS
500.0000 mg | ORAL_TABLET | Freq: Once | ORAL | Status: AC
  Administered 2015-02-01: 500 mg via ORAL
  Filled 2015-02-01: qty 1

## 2015-02-01 MED ORDER — SODIUM CHLORIDE 0.9 % IV BOLUS (SEPSIS)
1000.0000 mL | Freq: Once | INTRAVENOUS | Status: AC
  Administered 2015-02-01: 1000 mL via INTRAVENOUS

## 2015-02-01 MED ORDER — IOHEXOL 240 MG/ML SOLN
25.0000 mL | INTRAMUSCULAR | Status: AC
  Administered 2015-02-01: 25 mL via ORAL

## 2015-02-01 MED ORDER — CIPROFLOXACIN HCL 500 MG PO TABS
500.0000 mg | ORAL_TABLET | Freq: Once | ORAL | Status: AC
  Administered 2015-02-01: 500 mg via ORAL
  Filled 2015-02-01: qty 1

## 2015-02-01 MED ORDER — METRONIDAZOLE 500 MG PO TABS: 500.0000 mg | ORAL_TABLET | Freq: Three times a day (TID) | ORAL | Status: AC

## 2015-02-01 MED ORDER — IOHEXOL 300 MG/ML  SOLN
75.0000 mL | Freq: Once | INTRAMUSCULAR | Status: AC | PRN
  Administered 2015-02-01: 75 mL via INTRAVENOUS

## 2015-02-01 MED ORDER — FENTANYL CITRATE (PF) 100 MCG/2ML IJ SOLN
50.0000 ug | Freq: Once | INTRAMUSCULAR | Status: AC
  Administered 2015-02-01: 50 ug via INTRAVENOUS
  Filled 2015-02-01: qty 2

## 2015-02-01 NOTE — ED Notes (Signed)
Pt ambulatory to and from restroom without incident.

## 2015-02-01 NOTE — ED Notes (Signed)
Assumed pt care. NAD noted. RR Even and nonlabored. Family at bedside. Will continue to monitor.

## 2015-02-01 NOTE — ED Notes (Signed)
Patient with rectal bleeding for 2 days. Last BM was 6:30am.  Patient reports having bloody stools every hour through out night.

## 2015-02-01 NOTE — Discharge Instructions (Signed)
You have been seen in the emergency department for abdominal pain and bloody stool. Workup is most consistent with colitis. Please take your antibiotics as prescribed for the entire course. He may take Tylenol as needed for discomfort. Please follow up if primary care physician in 2-3 days for recheck. Return to the emergency department you have any further abdominal pain, fever, or vomiting unable to keep down your antibiotics.   Colitis Colitis is inflammation of the colon. Colitis may last a short time (acute) or it may last a long time (chronic). CAUSES This condition may be caused by:  Viruses.  Bacteria.  Reactions to medicine.  Certain autoimmune diseases, such as Crohn disease or ulcerative colitis. SYMPTOMS Symptoms of this condition include:  Diarrhea.  Passing bloody or tarry stool.  Pain.  Fever.  Vomiting.  Tiredness (fatigue).  Weight loss.  Bloating.  Sudden increase in abdominal pain.  Having fewer bowel movements than usual. DIAGNOSIS This condition is diagnosed with a stool test or a blood test. You may also have other tests, including X-rays, a CT scan, or a colonoscopy. TREATMENT Treatment may include:  Resting the bowel. This involves not eating or drinking for a period of time.  Fluids that are given through an IV tube.  Medicine for pain and diarrhea.  Antibiotic medicines.  Cortisone medicines.  Surgery. HOME CARE INSTRUCTIONS Eating and Drinking  Follow instructions from your health care provider about eating or drinking restrictions.  Drink enough fluid to keep your urine clear or pale yellow.  Work with a dietitian to determine which foods cause your condition to flare up.  Avoid foods that cause flare-ups.  Eat a well-balanced diet. Medicines  Take over-the-counter and prescription medicines only as told by your health care provider.  If you were prescribed an antibiotic medicine, take it as told by your health care  provider. Do not stop taking the antibiotic even if you start to feel better. General Instructions  Keep all follow-up visits as told by your health care provider. This is important. SEEK MEDICAL CARE IF:  Your symptoms do not go away.  You develop new symptoms. SEEK IMMEDIATE MEDICAL CARE IF:  You have a fever that does not go away with treatment.  You develop chills.  You have extreme weakness, fainting, or dehydration.  You have repeated vomiting.  You develop severe pain in your abdomen.  You pass bloody or tarry stool.   This information is not intended to replace advice given to you by your health care provider. Make sure you discuss any questions you have with your health care provider.   Document Released: 05/04/2004 Document Revised: 12/16/2014 Document Reviewed: 07/20/2014 Elsevier Interactive Patient Education [employer].

## 2015-02-01 NOTE — ED Notes (Signed)
Pt transported to and from CT via stretcher.

## 2015-02-01 NOTE — ED Provider Notes (Signed)
[facility] Emergency Department Provider Note  Time seen: 6:29 PM  I have reviewed the triage vital signs and the nursing notes.   HISTORY  Chief Complaint Rectal Bleeding    HPI Alexandria Rodriguez is a 64 y.o. female with a past medical history of depression, COPD, gastric reflux, diverticulosis, CVA, myasthenia gravis, presents the emergency department left-sided abdominal pain and bloody stool. According to the patient she is having intermittent bloody stools since Saturday. States a history of diverticulitis in the past. Him for the past 2 days she has been experiencing left-sided abdominal discomfort. Denies any black stool. States moderate left-sided abdominal pain, denies any modifying factors. Denies nausea, vomiting. States her stool was loose, last bowel movement 12 hours ago.     Past Medical History  Diagnosis Date  . Depression   . COPD (chronic obstructive pulmonary disease) ([place])   . GERD (gastroesophageal reflux disease)   . Osteoporosis   . Diverticulosis 10/31/10    On Tranverse colon  . Vitamin D deficiency   . Cerebrovascular disease   . Myasthenia gravis ([place]) 02/2013    Diagnosed by doctor Manuella Ghazi  . Neuropathy [facility])     Patient Active Problem List   Diagnosis Date Noted  . TMJ (temporomandibular joint disorder) 12/22/2014  . Diverticulosis 12/22/2014  . Depression 12/22/2014  . Cerebrovascular disease 12/22/2014  . COPD (chronic obstructive pulmonary disease) ([place]) 12/22/2014  . GERD (gastroesophageal reflux disease) 12/22/2014  . Fibrocystic breast disease 12/22/2014  . Dyspnea 12/22/2014  . Tremor 12/22/2014  . Radiculopathy of cervical region 12/22/2014  . Unexplained weight loss 12/22/2014  . Tobacco use 12/22/2014  . Adenomatous polyp 12/04/2014  . OP (osteoporosis) 12/04/2014  . B12 deficiency 11/04/2014  . Acquired polyneuropathy ([place]) 11/03/2014  . Abnormal barium swallow 10/02/2014  . Can't get food down 10/02/2014   . Anxiety 07/31/2013  . Clinical depression 07/31/2013  . LBP (low back pain) 07/31/2013  . Vitamin D deficiency 07/31/2013    Past Surgical History  Procedure Laterality Date  . Esophagogastroduodenoscopy N/A 10/05/2014    Procedure: ESOPHAGOGASTRODUODENOSCOPY (EGD);  Surgeon: Manya Silvas, MD;  Location: [facility] ENDOSCOPY;  Service: Endoscopy;  Laterality: N/A;  . Abdominal hysterectomy  2000    due to excessive bleeeding  . Rotator cuff repair Right 2003    Shoulder  . Repeat cuff surgery  2004    Developed adhesive capsulitis  . Cervical discectomy and fusion  [facility]  . Cervical cone biopsy      Current Outpatient Rx  Name  Route  Sig  Dispense  Refill  . atorvastatin (LIPITOR) 20 MG tablet   Oral   Take 20 mg by mouth daily.         Marland Kitchen buPROPion (WELLBUTRIN SR) 150 MG 12 hr tablet   Oral   Take 150 mg by mouth 2 (two) times daily.         . carbidopa-levodopa (SINEMET IR) 25-100 MG per tablet   Oral   Take 1 tablet by mouth 3 (three) times daily.         . carisoprodol (SOMA) 350 MG tablet   Oral   Take 350 mg by mouth 4 (four) times daily as needed for muscle spasms.         Marland Kitchen FLUoxetine (PROZAC) 10 MG capsule      TAKE 1 CAPSULE BY MOUTH EVERY DAY   30 capsule   6   . ibuprofen (  ADVIL,MOTRIN) 400 MG tablet      TAKE ONE TABLET BY MOUTH TWICE DAILY AS NEEDED FOR BACK PAIN   30 tablet   6   . omeprazole (PRILOSEC) 40 MG capsule      TAKE ONE CAPSULE BY MOUTH EVERY DAY   30 capsule   6   . pregabalin (LYRICA) 75 MG capsule   Oral   Take 75 mg by mouth 2 (two) times daily.         . primidone (MYSOLINE) 250 MG tablet   Oral   Take 250 mg by mouth 4 (four) times daily.         Marland Kitchen triamcinolone ointment (KENALOG) 0.1 %      APPLY TO AFFECTED AREA TWICE A DAY AS NEEDED.   60 g   1     Allergies Lidoderm; Morphine; Morphine and related; Sulfa antibiotics; and Tramadol  Family History  Problem  Relation Age of Onset  . Cancer Sister     Ovarian, stable    Social History Social History  Substance Use Topics  . Smoking status: Current Every Day Smoker  . Smokeless tobacco: Never Used     Comment:  Has been smoking for 30+ years, Cigarettes per day;10.  . Alcohol Use: No    Review of Systems Constitutional: Negative for fever Cardiovascular: Negative for chest pain. Respiratory: Negative for shortness of breath. Gastrointestinal: Left-sided abdominal pain, bloody stool.. Genitourinary: Negative for dysuria. Neurological: Negative for headache 10-point ROS otherwise negative.  ____________________________________________   PHYSICAL EXAM:  VITAL SIGNS: ED Triage Vitals  Enc Vitals Group     BP 02/01/15 1550 103/63 mmHg     Pulse Rate 02/01/15 1550 78     Resp 02/01/15 1550 16     Temp 02/01/15 1550 98.2 F (36.8 C)     Temp Source 02/01/15 1550 Oral     SpO2 02/01/15 1550 100 %     Weight 02/01/15 1550 100 lb (45.36 kg)     Height 02/01/15 1550 5\' 2"  (1.575 m)     Head Cir --      Peak Flow --      Pain Score 02/01/15 1658 4     Pain Loc --      Pain Edu? --      Excl. in [place]? --    Constitutional: Alert and oriented. Well appearing and in no distress. Eyes: Normal exam ENT   Head: Normocephalic and atraumatic.   Mouth/Throat: Mucous membranes are moist. Cardiovascular: Normal rate, regular rhythm. No murmur Respiratory: Normal respiratory effort without tachypnea nor retractions. Breath sounds are clear and equal bilaterally. No wheezes/rales/rhonchi. Gastrointestinal: Soft, moderate left-sided abdominal tenderness to palpation. No rebound or guarding. No distention. Rectal exam shows light brown stool, trace guaiac positive. Musculoskeletal: Nontender with normal range of motion in all extremities.  Neurologic:  Normal speech and language. No gross focal neurologic deficits  Psychiatric: Mood and affect are normal. Speech and behavior are  normal. ____________________________________________    INITIAL IMPRESSION / ASSESSMENT AND PLAN / ED COURSE  Pertinent labs & imaging results that were available during my care of the patient were reviewed by me and considered in my medical decision making (see chart for details).  Patient presents for 3 days of intermittent bloody bowel movements, last bowel movement 12 hours ago. Also left-sided abdominal pain. Mostly in the left lower quadrant. Labs are largely within normal limits, we will obtain a CT scan of the patient's abdomen to rule  out diverticulitis. Rectal exam shows light brown stool, tracely guaiac positive.  CT scan consistent with colitis. No signs of abscess or perforation. We'll place the patient on ciprofloxacin and Flagyl, and have her follow-up with her primary care physician in 2-3 days for recheck. I discussed this plan of care with the patient who is agreeable. I also discussed return precautions for fever, abdominal pain, vomiting unable to keep down her antibiotics or fluids. ____________________________________________   FINAL CLINICAL IMPRESSION(S) / ED DIAGNOSES  Left-sided abdominal pain Rectal bleeding Colitis  Harvest Dark, MD 02/01/15 2123

## 2015-02-01 NOTE — ED Notes (Signed)
Pt to ed sent over from Dr Vira Agar for further eval of rectal bleeding.

## 2015-02-12 DIAGNOSIS — K529 Noninfective gastroenteritis and colitis, unspecified: Secondary | ICD-10-CM | POA: Diagnosis not present

## 2015-03-19 ENCOUNTER — Other Ambulatory Visit: Payer: Self-pay | Admitting: Family Medicine

## 2015-03-22 NOTE — Telephone Encounter (Signed)
Rx called in to pharmacy. 

## 2015-03-22 NOTE — Telephone Encounter (Signed)
Please call in Soma 

## 2015-05-14 DIAGNOSIS — R251 Tremor, unspecified: Secondary | ICD-10-CM | POA: Diagnosis not present

## 2015-05-14 DIAGNOSIS — E538 Deficiency of other specified B group vitamins: Secondary | ICD-10-CM | POA: Diagnosis not present

## 2015-05-14 DIAGNOSIS — E559 Vitamin D deficiency, unspecified: Secondary | ICD-10-CM | POA: Diagnosis not present

## 2015-05-19 ENCOUNTER — Encounter: Payer: Self-pay | Admitting: Family Medicine

## 2015-05-26 ENCOUNTER — Ambulatory Visit: Payer: Medicare Other | Admitting: Family Medicine

## 2015-05-31 ENCOUNTER — Ambulatory Visit: Payer: Medicare Other | Admitting: Family Medicine

## 2015-06-01 ENCOUNTER — Telehealth: Payer: Self-pay | Admitting: Family Medicine

## 2015-06-01 ENCOUNTER — Ambulatory Visit (INDEPENDENT_AMBULATORY_CARE_PROVIDER_SITE_OTHER): Payer: Medicare Other | Admitting: Family Medicine

## 2015-06-01 VITALS — BP 90/58 | HR 64 | Temp 98.1°F | Resp 16 | Ht 62.5 in | Wt 93.0 lb

## 2015-06-01 DIAGNOSIS — E786 Lipoprotein deficiency: Secondary | ICD-10-CM | POA: Insufficient documentation

## 2015-06-01 DIAGNOSIS — G8929 Other chronic pain: Secondary | ICD-10-CM | POA: Insufficient documentation

## 2015-06-01 DIAGNOSIS — G629 Polyneuropathy, unspecified: Secondary | ICD-10-CM | POA: Diagnosis not present

## 2015-06-01 DIAGNOSIS — IMO0002 Reserved for concepts with insufficient information to code with codable children: Secondary | ICD-10-CM | POA: Insufficient documentation

## 2015-06-01 DIAGNOSIS — E559 Vitamin D deficiency, unspecified: Secondary | ICD-10-CM | POA: Diagnosis not present

## 2015-06-01 DIAGNOSIS — E538 Deficiency of other specified B group vitamins: Secondary | ICD-10-CM

## 2015-06-01 DIAGNOSIS — M549 Dorsalgia, unspecified: Secondary | ICD-10-CM

## 2015-06-01 DIAGNOSIS — G7000 Myasthenia gravis without (acute) exacerbation: Secondary | ICD-10-CM | POA: Insufficient documentation

## 2015-06-01 DIAGNOSIS — L209 Atopic dermatitis, unspecified: Secondary | ICD-10-CM | POA: Insufficient documentation

## 2015-06-01 MED ORDER — CLONAZEPAM 0.5 MG PO TABS: 0.5000 mg | ORAL_TABLET | Freq: Every evening | ORAL | Status: DC | PRN

## 2015-06-01 MED ORDER — CYANOCOBALAMIN 1000 MCG/ML IJ SOLN
1000.0000 ug | Freq: Once | INTRAMUSCULAR | Status: AC
  Administered 2015-06-01: 1000 ug via INTRAMUSCULAR

## 2015-06-01 MED ORDER — CYANOCOBALAMIN 1000 MCG/ML IJ SOLN
1000.0000 ug | Freq: Once | INTRAMUSCULAR | Status: AC
  Administered 2015-10-29: 1000 ug via INTRAMUSCULAR

## 2015-06-01 NOTE — Telephone Encounter (Signed)
Please call in clonazepam  

## 2015-06-01 NOTE — Progress Notes (Signed)
   Subjective:    Patient ID: Alexandria Rodriguez, female    DOB: 01/02/1951, 65 y.o.   MRN: [id number]  HPI Fatigue: Patient complains of worsening fatigue. Symptoms began 6 months ago. Symptoms of her fatigue have been change in appetite, numbness in feet. Patient describes the following psychologic symptoms: none.  Symptoms have gradually worsened. Severity has been struggles to carry out day to day responsibilities.. Previous visits for this problem: yes, last seen 6 months ago by me. Patient reports that she just forgot to continue with her B-12 injection and is requesting to restart injection. Patient reports that Dr. Manuella Ghazi is also recommending that pt restart her B-12 injection.  Patient last saw Dr. Manuella Ghazi on 05/14/2015.  Labs ordered by Dr. Manuella Ghazi on 05/14/15 found B12=113 and Vitamin D level of 5.3   Review of Systems  Constitutional: Positive for activity change, appetite change and fatigue.  Neurological: Positive for numbness.      Blood pressure 90/58, pulse 64, temperature 98.1 F (36.7 C), temperature source Oral, resp. rate 16, height 5' 2.5" (1.588 m), weight 93 lb (42.185 kg), SpO2 98 %. Objective:   Physical Exam   General Appearance:    Alert, cooperative, no distress, thin appears very fatigued.   Eyes:    PERRL, conjunctiva/corneas clear, EOM's intact       Lungs:     Clear to auscultation bilaterally, respirations unlabored  Heart:    Regular rate and rhythm  Neurologic:   Awake, alert, oriented x 3. No apparent focal neurological           defect.          Assessment & Plan:   1. B12 deficiency Severe. Start back on weekly b12 shots x 4. Follow up o.v. 1 month. If levels normalize will go back to once monthly injections.  - cyanocobalamin ((VITAMIN B-12)) injection 1,000 mcg; Inject 1 mL (1,000 mcg total) into the muscle once.   2. Vitamin D deficiency Continue vitamin D supplemenntation as recommended by Dr. Manuella Ghazi. Check levels in a month  3. Acquired polyneuropathy  ([place]) Followed by Dr. Manuella Ghazi on Sinemet.

## 2015-06-01 NOTE — Telephone Encounter (Signed)
Rx called in to pharmacy. 

## 2015-06-03 ENCOUNTER — Other Ambulatory Visit: Payer: Self-pay | Admitting: Family Medicine

## 2015-06-09 ENCOUNTER — Ambulatory Visit (INDEPENDENT_AMBULATORY_CARE_PROVIDER_SITE_OTHER): Payer: Medicare Other

## 2015-06-09 DIAGNOSIS — E538 Deficiency of other specified B group vitamins: Secondary | ICD-10-CM | POA: Diagnosis not present

## 2015-06-09 MED ORDER — CYANOCOBALAMIN 1000 MCG/ML IJ SOLN
1000.0000 ug | Freq: Once | INTRAMUSCULAR | Status: AC
  Administered 2015-06-09: 1000 ug via INTRAMUSCULAR

## 2015-06-16 ENCOUNTER — Ambulatory Visit (INDEPENDENT_AMBULATORY_CARE_PROVIDER_SITE_OTHER): Payer: Medicare Other

## 2015-06-16 DIAGNOSIS — E538 Deficiency of other specified B group vitamins: Secondary | ICD-10-CM | POA: Diagnosis not present

## 2015-06-16 MED ORDER — CYANOCOBALAMIN 1000 MCG/ML IJ SOLN
1000.0000 ug | Freq: Once | INTRAMUSCULAR | Status: AC
  Administered 2015-06-16: 1000 ug via INTRAMUSCULAR

## 2015-06-23 ENCOUNTER — Ambulatory Visit: Payer: Medicare Other

## 2015-06-24 ENCOUNTER — Telehealth: Payer: Self-pay | Admitting: Family Medicine

## 2015-06-25 ENCOUNTER — Ambulatory Visit (INDEPENDENT_AMBULATORY_CARE_PROVIDER_SITE_OTHER): Payer: Medicare Other

## 2015-06-25 DIAGNOSIS — E538 Deficiency of other specified B group vitamins: Secondary | ICD-10-CM | POA: Diagnosis not present

## 2015-06-25 MED ORDER — CYANOCOBALAMIN 1000 MCG/ML IJ SOLN
1000.0000 ug | Freq: Once | INTRAMUSCULAR | Status: AC
  Administered 2015-06-25: 1000 ug via INTRAMUSCULAR

## 2015-06-29 ENCOUNTER — Ambulatory Visit (INDEPENDENT_AMBULATORY_CARE_PROVIDER_SITE_OTHER): Payer: Medicare Other | Admitting: Family Medicine

## 2015-06-29 ENCOUNTER — Encounter: Payer: Self-pay | Admitting: Family Medicine

## 2015-06-29 VITALS — BP 100/62 | HR 70 | Temp 98.4°F | Resp 16 | Wt 95.0 lb

## 2015-06-29 DIAGNOSIS — E538 Deficiency of other specified B group vitamins: Secondary | ICD-10-CM

## 2015-06-29 NOTE — Progress Notes (Signed)
Patient: Alexandria Rodriguez Female    DOB: [birth year]   65 y.o.   MRN: [id number] Visit Date: 06/29/2015  Today's Provider: Lelon Huh, MD   Chief Complaint  Patient presents with  . Follow-up    Vitamin B12 Deficiency and Vitamin D Deficiency   Subjective:    HPI  Follow up Vitamin B12 Deficiency:  Last office visit was 1 month ago. She had seen Dr. Manuella Ghazi and found to have B12=113 (n>=300.0) on 05/14/2015. She had stopped taking B12 prior to that blood draw. Changes made include restarting back on weekly B12 shots x 4 weeks and taking daily oral b12 supplement. . She states her appetite has greatly improve and is eating much better, but not gaining significant weight. She states her energy level has been up and down for the past 4 weeks. Some days she feels good and other days she feels tired and worn out. She was also found to have very low vitamin D levels and has not been on weekly vitamin D supplements.        Allergies  Allergen Reactions  . Effexor Xr [Venlafaxine Hcl Er] Other (See Comments)    Reaction:  GI upset   . Fosamax [Alendronate Sodium] Nausea And Vomiting  . Erythromycin Swelling and Rash    Pt states that her tongue swells.    Katrinka Blazing [Lidocaine] Swelling and Rash  . Morphine And Related Swelling, Rash and Other (See Comments)    Pt states that her tongue swells.    . Sulfa Antibiotics Swelling and Rash  . Tramadol Swelling and Rash   Previous Medications   ASPIRIN 81 MG TABLET    Take 1 tablet by mouth daily.   BUPROPION (WELLBUTRIN SR) 150 MG 12 HR TABLET    Take 1 tablet by mouth 2 (two) times daily.   CARBIDOPA-LEVODOPA (SINEMET IR) 25-100 MG PER TABLET    Take 1 tablet by mouth 2 (two) times daily.    CARISOPRODOL (SOMA) 350 MG TABLET    TAKE ONE TABLET BY MOUTH TWICE DAILY AS NEEDED   CLONAZEPAM (KLONOPIN) 0.5 MG TABLET    Take 1 tablet (0.5 mg total) by mouth at bedtime as needed for anxiety.   CYANOCOBALAMIN (VITAMIN B 12 PO)    Take 1,000  capsules by mouth daily.   IBUPROFEN (ADVIL,MOTRIN) 400 MG TABLET    TAKE 1 TABLET BY MOUTH TWICE DAILY AS NEEDED FOR BACK PAIN   OMEPRAZOLE (PRILOSEC) 40 MG CAPSULE    Take 40 mg by mouth daily.   PREGABALIN (LYRICA) 75 MG CAPSULE    Take 75 mg by mouth 2 (two) times daily.   PRIMIDONE (MYSOLINE) 250 MG TABLET    Take 250 mg by mouth 2 (two) times daily.    TRIAMCINOLONE OINTMENT (KENALOG) 0.1 %    APPLY TO AFFECTED AREA TWICE A DAY AS NEEDED.   VITAMIN D, CHOLECALCIFEROL, 1000 UNITS CAPS    Take 1 capsule by mouth daily.   VITAMIN D, ERGOCALCIFEROL, (DRISDOL) 50000 UNITS CAPS CAPSULE    TK 1 C PO ONCE A WEEK FOR 8 WEEKS    Review of Systems  Constitutional: Positive for fatigue. Negative for fever, chills and appetite change.  Respiratory: Negative for chest tightness and shortness of breath.   Cardiovascular: Negative for chest pain and palpitations.  Gastrointestinal: Negative for nausea, vomiting and abdominal pain.  Neurological: Negative for dizziness and weakness.  Psychiatric/Behavioral:       Trouble remembering  things and understanding what someone is saying    Social History  Substance Use Topics  . Smoking status: Current Every Day Smoker -- 1.00 packs/day    Types: Cigarettes  . Smokeless tobacco: Never Used     Comment:  Has been smoking for 30+ years, Cigarettes per day;10.  . Alcohol Use: No   Objective:   BP 100/62 mmHg  Pulse 70  Temp(Src) 98.4 F (36.9 C) (Oral)  Resp 16  Wt 95 lb (43.092 kg)  SpO2 98%  Physical Exam  General appearance: alert, well developed, well nourished, cooperative and in no distress. Very thin Head: Normocephalic, without obvious abnormality, atraumatic Lungs: Respirations even and unlabored Extremities: No gross deformities Skin: Skin color, texture, turgor normal. No rashes seen       Assessment & Plan:     1. B12 deficiency Improved appetite, but still very fatigue since restarting weekly B12 injections for the last four  weeks.  - Vitamin B12 - CBC       Lelon Huh, MD  [facility]

## 2015-06-30 ENCOUNTER — Telehealth: Payer: Self-pay | Admitting: Family Medicine

## 2015-06-30 LAB — CBC
HEMATOCRIT: 32 % — AB (ref 34.0–46.6)
HEMOGLOBIN: 10.8 g/dL — AB (ref 11.1–15.9)
MCH: 35.9 pg — AB (ref 26.6–33.0)
MCHC: 33.8 g/dL (ref 31.5–35.7)
MCV: 106 fL — AB (ref 79–97)
Platelets: 222 10*3/uL (ref 150–379)
RBC: 3.01 x10E6/uL — AB (ref 3.77–5.28)
RDW: 14.7 % (ref 12.3–15.4)
WBC: 3.4 10*3/uL (ref 3.4–10.8)

## 2015-06-30 LAB — VITAMIN B12: Vitamin B-12: 728 pg/mL (ref 211–946)

## 2015-06-30 NOTE — Telephone Encounter (Signed)
Pt called for results from her labs yesterday.  Her call back is [phone]  Thanks teri

## 2015-07-01 LAB — SPECIMEN STATUS REPORT

## 2015-07-01 LAB — FOLATE: Folate: <2 ng/mL — ABNORMAL LOW (ref >3.0)

## 2015-07-01 NOTE — Telephone Encounter (Signed)
Please review. Thanks!  

## 2015-07-02 NOTE — Telephone Encounter (Signed)
See result note.  

## 2015-07-09 ENCOUNTER — Ambulatory Visit (INDEPENDENT_AMBULATORY_CARE_PROVIDER_SITE_OTHER): Payer: Medicare Other

## 2015-07-09 DIAGNOSIS — E538 Deficiency of other specified B group vitamins: Secondary | ICD-10-CM | POA: Diagnosis not present

## 2015-07-09 MED ORDER — CYANOCOBALAMIN 1000 MCG/ML IJ SOLN
1000.0000 ug | Freq: Once | INTRAMUSCULAR | Status: AC
  Administered 2015-07-09: 1000 ug via INTRAMUSCULAR

## 2015-07-14 ENCOUNTER — Other Ambulatory Visit: Payer: Self-pay | Admitting: Family Medicine

## 2015-07-14 NOTE — Telephone Encounter (Signed)
Please call in [place]

## 2015-07-14 NOTE — Telephone Encounter (Signed)
Rx called in to pharmacy. 

## 2015-07-19 ENCOUNTER — Other Ambulatory Visit: Payer: Self-pay | Admitting: Family Medicine

## 2015-07-19 MED ORDER — VITAMIN D (ERGOCALCIFEROL) 1.25 MG (50000 UNIT) PO CAPS: 50000.0000 [IU] | ORAL_CAPSULE | ORAL | Status: DC

## 2015-07-19 NOTE — Telephone Encounter (Signed)
Pt contacted office for refill request on the following medications:  Vitamin D, Ergocalciferol, (DRISDOL) 50000 units CAPS capsule.  [facility].  [phone]

## 2015-07-20 ENCOUNTER — Other Ambulatory Visit: Payer: Self-pay | Admitting: Family Medicine

## 2015-07-20 MED ORDER — BUPROPION HCL ER (SR) 150 MG PO TB12: 150.0000 mg | ORAL_TABLET | Freq: Two times a day (BID) | ORAL | Status: DC

## 2015-07-20 NOTE — Telephone Encounter (Signed)
Pt contacted office for refill request on the following medications:  buPROPion (WELLBUTRIN SR) 150 MG 12 hr tablet.  [facility].  [phone]

## 2015-07-23 ENCOUNTER — Ambulatory Visit (INDEPENDENT_AMBULATORY_CARE_PROVIDER_SITE_OTHER): Payer: Medicare Other

## 2015-07-23 DIAGNOSIS — E538 Deficiency of other specified B group vitamins: Secondary | ICD-10-CM

## 2015-07-23 MED ORDER — CYANOCOBALAMIN 1000 MCG/ML IJ SOLN
1000.0000 ug | Freq: Once | INTRAMUSCULAR | Status: AC
  Administered 2015-07-23: 1000 ug via INTRAMUSCULAR

## 2015-07-27 ENCOUNTER — Other Ambulatory Visit: Payer: Self-pay | Admitting: Family Medicine

## 2015-07-27 NOTE — Telephone Encounter (Signed)
Please call in clonazepam  

## 2015-07-27 NOTE — Telephone Encounter (Signed)
Rx called in to pharmacy. 

## 2015-07-29 ENCOUNTER — Other Ambulatory Visit: Payer: Self-pay | Admitting: Family Medicine

## 2015-08-06 ENCOUNTER — Ambulatory Visit (INDEPENDENT_AMBULATORY_CARE_PROVIDER_SITE_OTHER): Payer: Medicare Other

## 2015-08-06 DIAGNOSIS — E538 Deficiency of other specified B group vitamins: Secondary | ICD-10-CM | POA: Diagnosis not present

## 2015-08-06 MED ORDER — CYANOCOBALAMIN 1000 MCG/ML IJ SOLN
1000.0000 ug | Freq: Once | INTRAMUSCULAR | Status: AC
  Administered 2015-08-06: 1000 ug via INTRAMUSCULAR

## 2015-08-20 ENCOUNTER — Ambulatory Visit (INDEPENDENT_AMBULATORY_CARE_PROVIDER_SITE_OTHER): Payer: Medicare Other

## 2015-08-20 DIAGNOSIS — E538 Deficiency of other specified B group vitamins: Secondary | ICD-10-CM | POA: Diagnosis not present

## 2015-08-20 MED ORDER — CYANOCOBALAMIN 1000 MCG/ML IJ SOLN
1000.0000 ug | Freq: Once | INTRAMUSCULAR | Status: AC
  Administered 2015-08-20: 1000 ug via INTRAMUSCULAR

## 2015-08-23 ENCOUNTER — Other Ambulatory Visit: Payer: Self-pay | Admitting: Family Medicine

## 2015-08-23 NOTE — Telephone Encounter (Signed)
Please call in SOMA

## 2015-08-23 NOTE — Telephone Encounter (Signed)
Called in [place] into the pharmacy.

## 2015-08-27 ENCOUNTER — Other Ambulatory Visit: Payer: Self-pay | Admitting: Family Medicine

## 2015-09-03 ENCOUNTER — Ambulatory Visit (INDEPENDENT_AMBULATORY_CARE_PROVIDER_SITE_OTHER): Payer: Medicare Other

## 2015-09-03 DIAGNOSIS — E538 Deficiency of other specified B group vitamins: Secondary | ICD-10-CM | POA: Diagnosis not present

## 2015-09-03 MED ORDER — CYANOCOBALAMIN 1000 MCG/ML IJ SOLN
1000.0000 ug | Freq: Once | INTRAMUSCULAR | Status: AC
  Administered 2015-09-03: 1000 ug via INTRAMUSCULAR

## 2015-09-10 ENCOUNTER — Telehealth: Payer: Self-pay | Admitting: Family Medicine

## 2015-09-10 DIAGNOSIS — Z86010 Personal history of colonic polyps: Secondary | ICD-10-CM

## 2015-09-10 NOTE — Telephone Encounter (Signed)
Pt called to get a referral to Dr. Keith Rake. Pt stated she received a letter from their office advising pt it is time for her colonoscopy. Please advise. Thanks TNP

## 2015-09-10 NOTE — Telephone Encounter (Signed)
Please refer for colonoscopy follow up polyps

## 2015-09-17 ENCOUNTER — Ambulatory Visit (INDEPENDENT_AMBULATORY_CARE_PROVIDER_SITE_OTHER): Payer: Medicare Other

## 2015-09-17 DIAGNOSIS — E538 Deficiency of other specified B group vitamins: Secondary | ICD-10-CM

## 2015-09-17 MED ORDER — CYANOCOBALAMIN 1000 MCG/ML IJ SOLN
1000.0000 ug | Freq: Once | INTRAMUSCULAR | Status: AC
  Administered 2015-09-17: 1000 ug via INTRAMUSCULAR

## 2015-10-15 ENCOUNTER — Ambulatory Visit (INDEPENDENT_AMBULATORY_CARE_PROVIDER_SITE_OTHER): Payer: Medicare Other

## 2015-10-15 DIAGNOSIS — E538 Deficiency of other specified B group vitamins: Secondary | ICD-10-CM

## 2015-10-15 MED ORDER — CYANOCOBALAMIN 1000 MCG/ML IJ SOLN
1000.0000 ug | Freq: Once | INTRAMUSCULAR | Status: AC
  Administered 2015-10-15: 1000 ug via INTRAMUSCULAR

## 2015-10-25 ENCOUNTER — Other Ambulatory Visit: Payer: Self-pay | Admitting: Family Medicine

## 2015-10-29 ENCOUNTER — Ambulatory Visit (INDEPENDENT_AMBULATORY_CARE_PROVIDER_SITE_OTHER): Payer: Medicare Other | Admitting: Emergency Medicine

## 2015-10-29 DIAGNOSIS — E538 Deficiency of other specified B group vitamins: Secondary | ICD-10-CM | POA: Diagnosis not present

## 2015-11-03 DIAGNOSIS — G43109 Migraine with aura, not intractable, without status migrainosus: Secondary | ICD-10-CM | POA: Diagnosis not present

## 2015-11-03 DIAGNOSIS — G20 Parkinson's disease: Secondary | ICD-10-CM | POA: Diagnosis not present

## 2015-11-03 DIAGNOSIS — R251 Tremor, unspecified: Secondary | ICD-10-CM | POA: Diagnosis not present

## 2015-11-19 ENCOUNTER — Ambulatory Visit: Payer: Self-pay | Admitting: Family Medicine

## 2015-11-24 ENCOUNTER — Other Ambulatory Visit: Payer: Self-pay | Admitting: Family Medicine

## 2015-11-25 ENCOUNTER — Ambulatory Visit: Payer: Medicare Other | Admitting: Family Medicine

## 2015-11-25 ENCOUNTER — Other Ambulatory Visit: Payer: Self-pay | Admitting: Family Medicine

## 2015-11-25 NOTE — Telephone Encounter (Signed)
Rx called in to pharmacy. 

## 2015-12-02 ENCOUNTER — Ambulatory Visit (INDEPENDENT_AMBULATORY_CARE_PROVIDER_SITE_OTHER): Payer: Medicare Other

## 2015-12-02 DIAGNOSIS — E538 Deficiency of other specified B group vitamins: Secondary | ICD-10-CM

## 2015-12-02 MED ORDER — CYANOCOBALAMIN 1000 MCG/ML IJ SOLN
1000.0000 ug | Freq: Once | INTRAMUSCULAR | Status: AC
  Administered 2015-12-02: 1000 ug via INTRAMUSCULAR

## 2015-12-03 ENCOUNTER — Encounter: Payer: Self-pay | Admitting: *Deleted

## 2015-12-06 ENCOUNTER — Encounter
Admission: RE | Disposition: A | Discharge status: Home / Self Care | Payer: Self-pay | Source: Ambulatory Visit | Attending: Unknown Physician Specialty

## 2015-12-06 ENCOUNTER — Ambulatory Visit: Payer: Medicare Other | Admitting: Anesthesiology

## 2015-12-06 ENCOUNTER — Encounter: Payer: Self-pay | Admitting: Anesthesiology

## 2015-12-06 ENCOUNTER — Ambulatory Visit
Admission: RE | Admit: 2015-12-06 | Discharge: 2015-12-06 | Disposition: A | Discharge status: Home / Self Care | Payer: Medicare Other | Source: Ambulatory Visit | Attending: Unknown Physician Specialty | Admitting: Unknown Physician Specialty

## 2015-12-06 DIAGNOSIS — J449 Chronic obstructive pulmonary disease, unspecified: Secondary | ICD-10-CM | POA: Diagnosis not present

## 2015-12-06 DIAGNOSIS — G629 Polyneuropathy, unspecified: Secondary | ICD-10-CM | POA: Insufficient documentation

## 2015-12-06 DIAGNOSIS — K5790 Diverticulosis of intestine, part unspecified, without perforation or abscess without bleeding: Secondary | ICD-10-CM | POA: Diagnosis not present

## 2015-12-06 DIAGNOSIS — K219 Gastro-esophageal reflux disease without esophagitis: Secondary | ICD-10-CM | POA: Insufficient documentation

## 2015-12-06 DIAGNOSIS — Z86010 Personal history of colonic polyps: Secondary | ICD-10-CM | POA: Diagnosis not present

## 2015-12-06 DIAGNOSIS — Z800 Family history of malignant neoplasm of digestive organs: Secondary | ICD-10-CM | POA: Diagnosis not present

## 2015-12-06 DIAGNOSIS — K5730 Diverticulosis of large intestine without perforation or abscess without bleeding: Secondary | ICD-10-CM | POA: Diagnosis not present

## 2015-12-06 DIAGNOSIS — Z1211 Encounter for screening for malignant neoplasm of colon: Secondary | ICD-10-CM | POA: Diagnosis not present

## 2015-12-06 DIAGNOSIS — M810 Age-related osteoporosis without current pathological fracture: Secondary | ICD-10-CM | POA: Diagnosis not present

## 2015-12-06 DIAGNOSIS — Z8371 Family history of colonic polyps: Secondary | ICD-10-CM | POA: Diagnosis not present

## 2015-12-06 DIAGNOSIS — K648 Other hemorrhoids: Secondary | ICD-10-CM | POA: Diagnosis not present

## 2015-12-06 DIAGNOSIS — K640 First degree hemorrhoids: Secondary | ICD-10-CM | POA: Diagnosis not present

## 2015-12-06 DIAGNOSIS — K649 Unspecified hemorrhoids: Secondary | ICD-10-CM | POA: Diagnosis not present

## 2015-12-06 HISTORY — PX: COLONOSCOPY WITH PROPOFOL: SHX5780

## 2015-12-06 SURGERY — COLONOSCOPY WITH PROPOFOL
Anesthesia: General

## 2015-12-06 MED ORDER — FENTANYL CITRATE (PF) 100 MCG/2ML IJ SOLN
INTRAMUSCULAR | Status: DC | PRN
  Administered 2015-12-06: 50 ug via INTRAVENOUS

## 2015-12-06 MED ORDER — PROPOFOL 500 MG/50ML IV EMUL
INTRAVENOUS | Status: DC | PRN
Start: 2015-12-06 — End: 2015-12-06
  Administered 2015-12-06: 70 ug/kg/min via INTRAVENOUS

## 2015-12-06 MED ORDER — PROPOFOL 10 MG/ML IV BOLUS
INTRAVENOUS | Status: DC | PRN
  Administered 2015-12-06 (×3): 30 mg via INTRAVENOUS

## 2015-12-06 MED ORDER — SODIUM CHLORIDE 0.9 % IV SOLN: INTRAVENOUS | Status: DC

## 2015-12-06 MED ORDER — SODIUM CHLORIDE 0.9 % IV SOLN
INTRAVENOUS | Status: DC
Start: 2015-12-06 — End: 2015-12-06
  Administered 2015-12-06: 14:00:00 via INTRAVENOUS
  Administered 2015-12-06: 1000 mL via INTRAVENOUS
  Administered 2015-12-06: 13:00:00 via INTRAVENOUS

## 2015-12-06 NOTE — Anesthesia Preprocedure Evaluation (Signed)
Anesthesia Evaluation  Patient identified by MRN, date of birth, ID band Patient awake    Reviewed: Allergy & Precautions, H&P , NPO status , Patient's Chart, lab work & pertinent test results, reviewed documented beta blocker date and time   History of Anesthesia Complications Negative for: history of anesthetic complications  Airway Mallampati: II  TM Distance: >3 FB Neck ROM: full    Dental no notable dental hx. (+) Partial Upper, Missing   Pulmonary neg shortness of breath, neg sleep apnea, COPD, neg recent URI, Current Smoker,    Pulmonary exam normal breath sounds clear to auscultation       Cardiovascular Exercise Tolerance: Good negative cardio ROS Normal cardiovascular exam Rhythm:regular Rate:Normal     Neuro/Psych neg Seizures PSYCHIATRIC DISORDERS (Depression) TIA Neuromuscular disease (Myasthenia Gravis and Parkinson's)    GI/Hepatic Neg liver ROS, GERD  ,(+)   Esophageal Varices    ,   Endo/Other  negative endocrine ROS  Renal/GU negative Renal ROS  negative genitourinary   Musculoskeletal   Abdominal   Peds  Hematology negative hematology ROS (+)   Anesthesia Other Findings Past Medical History: No date: Cerebrovascular disease No date: COPD (chronic obstructive pulmonary disease) (* No date: Depression 10/31/10: Diverticulosis     Comment: On Tranverse colon No date: GERD (gastroesophageal reflux disease) 02/2013: Myasthenia gravis ([place])     Comment: Diagnosed by doctor Manuella Ghazi No date: Neuropathy ([place]) No date: Osteoporosis No date: Vitamin D deficiency   Reproductive/Obstetrics negative OB ROS                             Anesthesia Physical Anesthesia Plan  ASA: II  Anesthesia Plan: General   Post-op Pain Management:    Induction:   Airway Management Planned:   Additional Equipment:   Intra-op Plan:   Post-operative Plan:   Informed Consent: I have  reviewed the patients History and Physical, chart, labs and discussed the procedure including the risks, benefits and alternatives for the proposed anesthesia with the patient or authorized representative who has indicated his/her understanding and acceptance.   Dental Advisory Given  Plan Discussed with: Anesthesiologist, CRNA and Surgeon  Anesthesia Plan Comments:         Anesthesia Quick Evaluation

## 2015-12-06 NOTE — Op Note (Signed)
[facility] Patient Name: Alexandria Rodriguez Procedure Date: 12/06/2015 1:02 PM MRN: [id number] Account #: [id number] Date of Birth: 1950-07-30 Admit Type: Outpatient Age: 65 Room: [facility] ENDO ROOM 1 Gender: Female Note Status: Finalized Procedure:            Colonoscopy Indications:          Screening for colorectal malignant neoplasm Providers:            Manya Silvas, MD Referring MD:         Kirstie Peri. Caryn Section, MD (Referring MD) Medicines:            Propofol per Anesthesia Complications:        No immediate complications. Procedure:            Pre-Anesthesia Assessment:                       - After reviewing the risks and benefits, the patient                        was deemed in satisfactory condition to undergo the                        procedure.                       After obtaining informed consent, the colonoscope was                        passed under direct vision. Throughout the procedure,                        the patient's blood pressure, pulse, and oxygen                        saturations were monitored continuously. The                        Colonoscope was introduced through the anus and                        advanced to the the cecum, identified by appendiceal                        orifice and ileocecal valve. The colonoscopy was                        technically difficult and complex due to restricted                        mobility of the colon and a tortuous colon. The patient                        tolerated the procedure well. The quality of the bowel                        preparation was excellent. Findings:      A few small-mouthed diverticula were found in the sigmoid colon.      Internal hemorrhoids were found during endoscopy. The hemorrhoids were       small and Grade I (internal hemorrhoids that do not prolapse).  The exam was otherwise without abnormality. Impression:           - Diverticulosis in the  sigmoid colon.                       - Internal hemorrhoids.                       - The examination was otherwise normal.                       - No specimens collected. Recommendation:       - Repeat colonoscopy in 5 years for surveillance. Manya Silvas, MD 12/06/2015 1:35:21 PM This report has been signed electronically. Number of Addenda: 0 Note Initiated On: 12/06/2015 1:02 PM Scope Withdrawal Time: 0 hours 6 minutes 27 seconds  Total Procedure Duration: 0 hours 20 minutes 13 seconds       [facility]

## 2015-12-06 NOTE — Anesthesia Postprocedure Evaluation (Signed)
Anesthesia Post Note  Patient: Alexandria Rodriguez  Procedure(s) Performed: Procedure(s) (LRB): COLONOSCOPY WITH PROPOFOL (N/A)  Patient location during evaluation: PACU Anesthesia Type: General Level of consciousness: awake and alert Pain management: pain level controlled Vital Signs Assessment: post-procedure vital signs reviewed and stable Respiratory status: spontaneous breathing, nonlabored ventilation, respiratory function stable and patient connected to nasal cannula oxygen Cardiovascular status: blood pressure returned to baseline and stable Postop Assessment: no signs of nausea or vomiting Anesthetic complications: no    Last Vitals:  Vitals:   12/06/15 1357 12/06/15 1407  BP: 135/68 123/67  Pulse: 70 69  Resp: 13 10  Temp:      Last Pain:  Vitals:   12/06/15 1337  TempSrc: Tympanic  PainSc: Byram Apolo [place]

## 2015-12-06 NOTE — Transfer of Care (Signed)
Immediate Anesthesia Transfer of Care Note  Patient: Alexandria Rodriguez  Procedure(s) Performed: Procedure(s): COLONOSCOPY WITH PROPOFOL (N/A)  Patient Location: PACU  Anesthesia Type:General  Level of Consciousness: awake, alert  and oriented  Airway & Oxygen Therapy: Patient Spontanous Breathing and Patient connected to nasal cannula oxygen  Post-op Assessment: VSS Post vital signs: Reviewed and stable  Last Vitals:  Vitals:   12/06/15 1212 12/06/15 1337  BP: (!) 110/58   Pulse: 69 75  Resp: 16 13  Temp: 36.8 C 36.2 C    Last Pain:  Vitals:   12/06/15 1337  TempSrc: Tympanic  PainSc:          Complications: No apparent anesthesia complications

## 2015-12-06 NOTE — H&P (Signed)
Primary Care Physician:  Lelon Huh, MD Primary Gastroenterologist:  Dr. Vira Agar  Pre-Procedure History & Physical: HPI:  Alexandria Rodriguez is a 65 y.o. female is here for an colonoscopy.   Past Medical History:  Diagnosis Date  . Cerebrovascular disease   . COPD (chronic obstructive pulmonary disease) ([place])   . Depression   . Diverticulosis 10/31/10   On Tranverse colon  . GERD (gastroesophageal reflux disease)   . Myasthenia gravis ([place]) 02/2013   Diagnosed by doctor Manuella Ghazi  . Neuropathy ([place])   . Osteoporosis   . Vitamin D deficiency     Past Surgical History:  Procedure Laterality Date  . ABDOMINAL HYSTERECTOMY  2000   due to excessive bleeeding  . CERVICAL CONE BIOPSY    . Cervical discectomy and fusion  [facility]  . ESOPHAGOGASTRODUODENOSCOPY N/A 10/05/2014   Procedure: ESOPHAGOGASTRODUODENOSCOPY (EGD);  Surgeon: Manya Silvas, MD;  Location: [facility] ENDOSCOPY;  Service: Endoscopy;  Laterality: N/A;  . Repeat cuff surgery  2004   Developed adhesive capsulitis  . ROTATOR CUFF REPAIR Right 2003   Shoulder    Prior to Admission medications   Medication Sig Start Date End Date Taking? Authorizing Provider  buPROPion (WELLBUTRIN SR) 150 MG 12 hr tablet Take 1 tablet (150 mg total) by mouth 2 (two) times daily. 07/20/15  Yes Birdie Sons, MD  carbidopa-levodopa (SINEMET IR) 25-100 MG per tablet Take 1 tablet by mouth 2 (two) times daily.    Yes Historical Provider, MD  carisoprodol (SOMA) 350 MG tablet TAKE 1 TABLET BY MOUTH TWICE DAILY AS NEEDED. 08/23/15  Yes Birdie Sons, MD  clonazePAM (KLONOPIN) 0.5 MG tablet TAKE 1 TABLET BY MOUTH AT BEDTIME AS NEEDED FOR ANXIETY. 11/25/15  Yes Birdie Sons, MD  Cyanocobalamin (VITAMIN B 12 PO) Take 1,000 capsules by mouth daily.   Yes Historical Provider, MD  ibuprofen (ADVIL,MOTRIN) 400 MG tablet TAKE 1 TABLET BY MOUTH TWICE DAILY AS NEEDED FOR BACK PAIN 11/24/15  Yes Birdie Sons, MD  LYRICA 75  MG capsule TAKE ONE CAPSULE BY MOUTH TWICE DAILY 07/14/15  Yes Birdie Sons, MD  omeprazole (PRILOSEC) 40 MG capsule TAKE ONE CAPSULE BY MOUTH EVERY DAY 08/27/15  Yes Birdie Sons, MD  primidone (MYSOLINE) 250 MG tablet Take 250 mg by mouth 2 (two) times daily.    Yes Historical Provider, MD  triamcinolone ointment (KENALOG) 0.1 % APPLY TO AFFECTED AREA TWICE A DAY AS NEEDED. 03/26/15  Yes Historical Provider, MD  Vitamin D, Cholecalciferol, 1000 units CAPS Take 1 capsule by mouth daily.   Yes Historical Provider, MD  Vitamin D, Ergocalciferol, (DRISDOL) 50000 units CAPS capsule Take 1 capsule (50,000 Units total) by mouth every 7 (seven) days. 07/19/15  Yes Birdie Sons, MD  aspirin 81 MG tablet Take 1 tablet by mouth daily.    Historical Provider, MD    Allergies as of 10/25/2015 - Review Complete 06/29/2015  Allergen Reaction Noted  . Effexor xr [venlafaxine hcl er] Other (See Comments) 02/01/2015  . Fosamax [alendronate sodium] Nausea And Vomiting 02/01/2015  . Erythromycin Swelling and Rash 02/01/2015  . Lidoderm [lidocaine] Swelling and Rash 10/05/2014  . Morphine and related Swelling, Rash, and Other (See Comments) 10/05/2014  . Sulfa antibiotics Swelling and Rash 12/04/2014  . Tramadol Swelling and Rash 12/04/2014    Family History  Problem Relation Age of Onset  . Cancer Sister     Ovarian, stable  Social History   Social History  . Marital status: Married    Spouse name: N/A  . Number of children: N/A  . Years of education: N/A   Occupational History  . Not on file.   Social History Main Topics  . Smoking status: Current Every Day Smoker    Packs/day: 1.00    Types: Cigarettes  . Smokeless tobacco: Never Used     Comment:  Has been smoking for 30+ years, Cigarettes per day;10.  . Alcohol use No  . Drug use: No  . Sexual activity: Not on file   Other Topics Concern  . Not on file   Social History Narrative  . No narrative on file    Review of  Systems: See HPI, otherwise negative ROS  Physical Exam: BP (!) 110/58   Pulse 69   Temp 98.2 F (36.8 C) (Tympanic)   Resp 16   Ht 5' 2.5" (1.588 m)   Wt 45.3 kg (99 lb 12.8 oz)   SpO2 99%   BMI 17.96 kg/m  General:   Alert,  pleasant and cooperative in NAD Head:  Normocephalic and atraumatic. Neck:  Supple; no masses or thyromegaly. Lungs:  Clear throughout to auscultation.    Heart:  Regular rate and rhythm. Abdomen:  Soft, nontender and nondistended. Normal bowel sounds, without guarding, and without rebound.   Neurologic:  Alert and  oriented x4;  grossly normal neurologically.  Impression/Plan: Alexandria Rodriguez is here for an colonoscopy to be performed for [facility] colon polyps  Risks, benefits, limitations, and alternatives regarding  colonoscopy have been reviewed with the patient.  Questions have been answered.  All parties agreeable.   Gaylyn Cheers, MD  12/06/2015, 12:43 PM

## 2015-12-27 ENCOUNTER — Other Ambulatory Visit: Payer: Self-pay | Admitting: Family Medicine

## 2015-12-27 NOTE — Telephone Encounter (Signed)
Please call in Soma 

## 2015-12-28 ENCOUNTER — Encounter: Payer: Self-pay | Admitting: Family Medicine

## 2015-12-28 ENCOUNTER — Ambulatory Visit (INDEPENDENT_AMBULATORY_CARE_PROVIDER_SITE_OTHER): Payer: Medicare Other | Admitting: Family Medicine

## 2015-12-28 ENCOUNTER — Ambulatory Visit
Admission: RE | Admit: 2015-12-28 | Discharge: 2015-12-28 | Disposition: A | Discharge status: Home / Self Care | Payer: Medicare Other | Source: Ambulatory Visit | Attending: Family Medicine | Admitting: Family Medicine

## 2015-12-28 VITALS — BP 104/58 | HR 72 | Temp 99.0°F | Resp 20 | Wt 100.0 lb

## 2015-12-28 DIAGNOSIS — M7989 Other specified soft tissue disorders: Secondary | ICD-10-CM | POA: Diagnosis not present

## 2015-12-28 DIAGNOSIS — E538 Deficiency of other specified B group vitamins: Secondary | ICD-10-CM

## 2015-12-28 DIAGNOSIS — M79641 Pain in right hand: Secondary | ICD-10-CM | POA: Diagnosis not present

## 2015-12-28 DIAGNOSIS — S66911A Strain of unspecified muscle, fascia and tendon at wrist and hand level, right hand, initial encounter: Secondary | ICD-10-CM | POA: Diagnosis not present

## 2015-12-28 DIAGNOSIS — Z23 Encounter for immunization: Secondary | ICD-10-CM | POA: Diagnosis not present

## 2015-12-28 DIAGNOSIS — X58XXXA Exposure to other specified factors, initial encounter: Secondary | ICD-10-CM | POA: Insufficient documentation

## 2015-12-28 MED ORDER — CYANOCOBALAMIN 1000 MCG/ML IJ SOLN
1000.0000 ug | Freq: Once | INTRAMUSCULAR | Status: AC
  Administered 2015-12-28: 1000 ug via INTRAMUSCULAR

## 2015-12-28 MED ORDER — MELOXICAM 15 MG PO TABS: 15.0000 mg | ORAL_TABLET | Freq: Every day | ORAL | 1 refills | Status: DC

## 2015-12-28 NOTE — Progress Notes (Signed)
Patient: Alexandria Rodriguez Female    DOB: [birth year]   65 y.o.   MRN: [id number] Visit Date: 12/28/2015  Today's Provider: Lelon Huh, MD   Chief Complaint  Patient presents with  . Fall    also right hand pain   Subjective:    HPI Patient reports that she had a fall about 1 month ago and injured her right hand. She reports that she is still having pain from the fall. Patient reports that she has not been evaluated since the fall. She reports that she has joint pain and swelling throughout her right hand. Patient denies any other falls since the one she had 1 month ago.     Allergies  Allergen Reactions  . Effexor Xr [Venlafaxine Hcl Er] Other (See Comments)    Reaction:  GI upset   . Fosamax [Alendronate Sodium] Nausea And Vomiting  . Erythromycin Swelling and Rash    Pt states that her tongue swells.    Katrinka Blazing [Lidocaine] Swelling and Rash  . Morphine And Related Swelling, Rash and Other (See Comments)    Pt states that her tongue swells.    . Sulfa Antibiotics Swelling and Rash  . Tramadol Swelling and Rash     Current Outpatient Prescriptions:  .  aspirin 81 MG tablet, Take 1 tablet by mouth daily., Disp: , Rfl:  .  buPROPion (WELLBUTRIN SR) 150 MG 12 hr tablet, Take 1 tablet (150 mg total) by mouth 2 (two) times daily., Disp: 60 tablet, Rfl: 11 .  carbidopa-levodopa (SINEMET IR) 25-100 MG per tablet, Take 1 tablet by mouth 2 (two) times daily. , Disp: , Rfl:  .  clonazePAM (KLONOPIN) 0.5 MG tablet, TAKE 1 TABLET BY MOUTH AT BEDTIME AS NEEDED FOR ANXIETY., Disp: 30 tablet, Rfl: 5 .  Cyanocobalamin (VITAMIN B 12 PO), Take 1,000 capsules by mouth daily., Disp: , Rfl:  .  ibuprofen (ADVIL,MOTRIN) 400 MG tablet, TAKE 1 TABLET BY MOUTH TWICE DAILY AS NEEDED FOR BACK PAIN, Disp: 30 tablet, Rfl: 5 .  LYRICA 75 MG capsule, TAKE ONE CAPSULE BY MOUTH TWICE DAILY, Disp: 60 capsule, Rfl: 5 .  omeprazole (PRILOSEC) 40 MG capsule, TAKE ONE CAPSULE BY MOUTH EVERY DAY, Disp: 30  capsule, Rfl: 5 .  primidone (MYSOLINE) 250 MG tablet, Take 250 mg by mouth 2 (two) times daily. , Disp: , Rfl:  .  triamcinolone ointment (KENALOG) 0.1 %, APPLY TO AFFECTED AREA TWICE A DAY AS NEEDED., Disp: , Rfl: 0 .  Vitamin D, Cholecalciferol, 1000 units CAPS, Take 1 capsule by mouth daily., Disp: , Rfl:  .  Vitamin D, Ergocalciferol, (DRISDOL) 50000 units CAPS capsule, Take 1 capsule (50,000 Units total) by mouth every 7 (seven) days., Disp: 12 capsule, Rfl: 4 .  carisoprodol (SOMA) 350 MG tablet, TAKE 1 TABLET BY MOUTH TWICE DAILY AS NEEDED, Disp: 60 tablet, Rfl: 5  Review of Systems  Respiratory: Negative.   Cardiovascular: Negative.   Musculoskeletal: Positive for arthralgias, gait problem, joint swelling and myalgias.       Patient walks with a cane.  Skin: Negative.   Neurological: Positive for weakness.    Social History  Substance Use Topics  . Smoking status: Current Every Day Smoker    Packs/day: 1.00    Types: Cigarettes  . Smokeless tobacco: Never Used     Comment:  Has been smoking for 30+ years, Cigarettes per day;10.  . Alcohol use No   Objective:   BP Marland Kitchen)  104/58 (BP Location: Left Arm, Patient Position: Sitting, Cuff Size: Small)   Pulse 72   Temp 99 F (37.2 C)   Resp 20   Wt 100 lb (45.4 kg)   BMI 18.00 kg/m   Physical Exam  General appearance: alert, well developed, well nourished, cooperative and in no distress Head: Normocephalic, without obvious abnormality, atraumatic Respiratory: Respirations even and unlabored, normal respiratory rate Extremities: Mild swelling and tenderness across dorsal aspect of wrist and hand. No erythema. Pain with fist making. Unable to fully flex digits.      Assessment & Plan:     1. B12 deficiency Due for b12 injection. She had been getting b12 injections every 2 week and inquires whether Q month injections are sufficient. She will stop by lab 2 weeks from today to check B12 - Vitamin B12 - cyanocobalamin  ((VITAMIN B-12)) injection 1,000 mcg; Inject 1 mL (1,000 mcg total) into the muscle once.  2. Strain of right hand, initial encounter Recommend apply ice 2-3 times a day. Discussed OT but she states she can't afford co-pay. She would like Xrays done which were ordered today. Change from ibuprofen to meloxicam temporarily.  - DG Hand Complete Right; Future  3. . Need for influenza vaccination  - Flu vaccine HIGH DOSE PF       Lelon Huh, MD  [facility]

## 2015-12-28 NOTE — Patient Instructions (Signed)
Go to the lab exactly 2 weeks from today ( 01/11/2016) to have Vitamin B12 levels checked.

## 2015-12-29 ENCOUNTER — Ambulatory Visit: Payer: Medicare Other

## 2016-01-11 DIAGNOSIS — E538 Deficiency of other specified B group vitamins: Secondary | ICD-10-CM | POA: Diagnosis not present

## 2016-01-12 LAB — VITAMIN B12: VITAMIN B 12: 626 pg/mL (ref 211–946)

## 2016-01-20 ENCOUNTER — Encounter: Payer: Self-pay | Admitting: Unknown Physician Specialty

## 2016-01-25 ENCOUNTER — Telehealth: Payer: Self-pay | Admitting: Family Medicine

## 2016-01-25 NOTE — Telephone Encounter (Signed)
Done

## 2016-01-25 NOTE — Telephone Encounter (Signed)
Pt called saying she is changing pharmacies due to insurance.  [facility] needs a list of her medication allergies.  They want it faxed to [phone]  Pt's call back is [phone]  Thank sTeri

## 2016-01-26 ENCOUNTER — Other Ambulatory Visit: Payer: Self-pay | Admitting: *Deleted

## 2016-01-26 MED ORDER — IBUPROFEN 400 MG PO TABS: ORAL_TABLET | ORAL | 1 refills | Status: DC

## 2016-01-26 MED ORDER — BUPROPION HCL ER (SR) 150 MG PO TB12: 150.0000 mg | ORAL_TABLET | Freq: Two times a day (BID) | ORAL | 3 refills | Status: DC

## 2016-01-26 MED ORDER — OMEPRAZOLE 40 MG PO CPDR: 40.0000 mg | DELAYED_RELEASE_CAPSULE | Freq: Every day | ORAL | 4 refills | Status: DC

## 2016-01-26 MED ORDER — CLONAZEPAM 0.5 MG PO TABS: ORAL_TABLET | ORAL | 1 refills | Status: DC

## 2016-01-26 MED ORDER — CARISOPRODOL 350 MG PO TABS: 350.0000 mg | ORAL_TABLET | Freq: Two times a day (BID) | ORAL | 1 refills | Status: DC | PRN

## 2016-01-26 NOTE — Telephone Encounter (Signed)
Requesting 90 day supply sent to [facility].

## 2016-02-02 ENCOUNTER — Ambulatory Visit (INDEPENDENT_AMBULATORY_CARE_PROVIDER_SITE_OTHER): Payer: Medicare Other

## 2016-02-02 DIAGNOSIS — E538 Deficiency of other specified B group vitamins: Secondary | ICD-10-CM

## 2016-02-02 MED ORDER — CYANOCOBALAMIN 1000 MCG/ML IJ SOLN
1000.0000 ug | Freq: Once | INTRAMUSCULAR | Status: AC
  Administered 2016-02-02: 1000 ug via INTRAMUSCULAR

## 2016-02-11 ENCOUNTER — Telehealth: Payer: Self-pay | Admitting: Family Medicine

## 2016-02-11 DIAGNOSIS — M79641 Pain in right hand: Secondary | ICD-10-CM

## 2016-02-11 NOTE — Telephone Encounter (Signed)
Please review. KW 

## 2016-02-11 NOTE — Telephone Encounter (Signed)
Please refer orthopedics for hand pain. She prefers to see MD at [facility] clinic.

## 2016-02-11 NOTE — Telephone Encounter (Signed)
Pt was in a couple months ago for a sprain in her right hand.  She seen Dr. Caryn Section and had xrays, no fracture. She said it is still swollen and has a lot of pain.Marland Kitchen   She would like to see an orthopedic doctor at [facility].  Pt wants to see the doctor and not a PA.  She has [employer].  [place] told her that her primary will have to schedulle her appt.      Pt's call back is [phone]   Thanks Con Memos

## 2016-02-21 DIAGNOSIS — M25641 Stiffness of right hand, not elsewhere classified: Secondary | ICD-10-CM | POA: Diagnosis not present

## 2016-03-01 ENCOUNTER — Ambulatory Visit (INDEPENDENT_AMBULATORY_CARE_PROVIDER_SITE_OTHER): Payer: Medicare Other

## 2016-03-01 DIAGNOSIS — E538 Deficiency of other specified B group vitamins: Secondary | ICD-10-CM | POA: Diagnosis not present

## 2016-03-01 MED ORDER — CYANOCOBALAMIN 1000 MCG/ML IJ SOLN
1000.0000 ug | Freq: Once | INTRAMUSCULAR | Status: AC
  Administered 2016-03-01: 1000 ug via INTRAMUSCULAR

## 2016-03-15 ENCOUNTER — Other Ambulatory Visit: Payer: Self-pay | Admitting: Family Medicine

## 2016-03-29 ENCOUNTER — Ambulatory Visit: Payer: Self-pay

## 2016-04-10 ENCOUNTER — Encounter: Payer: Self-pay | Admitting: Emergency Medicine

## 2016-04-10 ENCOUNTER — Emergency Department: Payer: Medicare Other

## 2016-04-10 ENCOUNTER — Observation Stay
Admission: EM | Admit: 2016-04-10 | Discharge: 2016-04-12 | Disposition: A | Discharge status: Skilled Nursing Facility | Payer: Medicare Other | Attending: Internal Medicine | Admitting: Internal Medicine

## 2016-04-10 DIAGNOSIS — R531 Weakness: Secondary | ICD-10-CM | POA: Diagnosis not present

## 2016-04-10 DIAGNOSIS — S42294A Other nondisplaced fracture of upper end of right humerus, initial encounter for closed fracture: Secondary | ICD-10-CM

## 2016-04-10 DIAGNOSIS — F419 Anxiety disorder, unspecified: Secondary | ICD-10-CM | POA: Insufficient documentation

## 2016-04-10 DIAGNOSIS — W010XXA Fall on same level from slipping, tripping and stumbling without subsequent striking against object, initial encounter: Secondary | ICD-10-CM | POA: Diagnosis not present

## 2016-04-10 DIAGNOSIS — J449 Chronic obstructive pulmonary disease, unspecified: Secondary | ICD-10-CM | POA: Insufficient documentation

## 2016-04-10 DIAGNOSIS — M549 Dorsalgia, unspecified: Secondary | ICD-10-CM | POA: Insufficient documentation

## 2016-04-10 DIAGNOSIS — Z79899 Other long term (current) drug therapy: Secondary | ICD-10-CM | POA: Insufficient documentation

## 2016-04-10 DIAGNOSIS — G250 Essential tremor: Secondary | ICD-10-CM | POA: Insufficient documentation

## 2016-04-10 DIAGNOSIS — M25551 Pain in right hip: Secondary | ICD-10-CM | POA: Insufficient documentation

## 2016-04-10 DIAGNOSIS — M1990 Unspecified osteoarthritis, unspecified site: Secondary | ICD-10-CM | POA: Diagnosis not present

## 2016-04-10 DIAGNOSIS — I679 Cerebrovascular disease, unspecified: Secondary | ICD-10-CM | POA: Insufficient documentation

## 2016-04-10 DIAGNOSIS — G20 Parkinson's disease: Secondary | ICD-10-CM | POA: Diagnosis not present

## 2016-04-10 DIAGNOSIS — S42309A Unspecified fracture of shaft of humerus, unspecified arm, initial encounter for closed fracture: Secondary | ICD-10-CM | POA: Diagnosis present

## 2016-04-10 DIAGNOSIS — G629 Polyneuropathy, unspecified: Secondary | ICD-10-CM | POA: Diagnosis not present

## 2016-04-10 DIAGNOSIS — F17210 Nicotine dependence, cigarettes, uncomplicated: Secondary | ICD-10-CM | POA: Insufficient documentation

## 2016-04-10 DIAGNOSIS — M79621 Pain in right upper arm: Secondary | ICD-10-CM

## 2016-04-10 DIAGNOSIS — Z8673 Personal history of transient ischemic attack (TIA), and cerebral infarction without residual deficits: Secondary | ICD-10-CM | POA: Insufficient documentation

## 2016-04-10 DIAGNOSIS — R262 Difficulty in walking, not elsewhere classified: Secondary | ICD-10-CM

## 2016-04-10 DIAGNOSIS — K219 Gastro-esophageal reflux disease without esophagitis: Secondary | ICD-10-CM | POA: Diagnosis not present

## 2016-04-10 DIAGNOSIS — F329 Major depressive disorder, single episode, unspecified: Secondary | ICD-10-CM | POA: Diagnosis not present

## 2016-04-10 DIAGNOSIS — Z885 Allergy status to narcotic agent status: Secondary | ICD-10-CM | POA: Insufficient documentation

## 2016-04-10 DIAGNOSIS — Z791 Long term (current) use of non-steroidal anti-inflammatories (NSAID): Secondary | ICD-10-CM | POA: Diagnosis not present

## 2016-04-10 DIAGNOSIS — G8929 Other chronic pain: Secondary | ICD-10-CM | POA: Insufficient documentation

## 2016-04-10 DIAGNOSIS — M25511 Pain in right shoulder: Secondary | ICD-10-CM | POA: Diagnosis not present

## 2016-04-10 DIAGNOSIS — M6281 Muscle weakness (generalized): Secondary | ICD-10-CM

## 2016-04-10 DIAGNOSIS — Z7982 Long term (current) use of aspirin: Secondary | ICD-10-CM | POA: Insufficient documentation

## 2016-04-10 DIAGNOSIS — S79911A Unspecified injury of right hip, initial encounter: Secondary | ICD-10-CM | POA: Diagnosis not present

## 2016-04-10 DIAGNOSIS — W19XXXA Unspecified fall, initial encounter: Secondary | ICD-10-CM | POA: Diagnosis not present

## 2016-04-10 DIAGNOSIS — S42201A Unspecified fracture of upper end of right humerus, initial encounter for closed fracture: Principal | ICD-10-CM | POA: Insufficient documentation

## 2016-04-10 HISTORY — DX: Unspecified fracture of shaft of humerus, unspecified arm, initial encounter for closed fracture: S42.309A

## 2016-04-10 HISTORY — DX: Parkinson's disease: G20

## 2016-04-10 HISTORY — DX: Unspecified contact dermatitis, unspecified cause: L25.9

## 2016-04-10 HISTORY — DX: Cerebral infarction, unspecified: I63.9

## 2016-04-10 LAB — CBC
HEMATOCRIT: 30.5 % — AB (ref 35.0–47.0)
Hemoglobin: 10.5 g/dL — ABNORMAL LOW (ref 12.0–16.0)
MCH: 36.9 pg — ABNORMAL HIGH (ref 26.0–34.0)
MCHC: 34.5 g/dL (ref 32.0–36.0)
MCV: 106.8 fL — ABNORMAL HIGH (ref 80.0–100.0)
PLATELETS: 178 10*3/uL (ref 150–440)
RBC: 2.86 MIL/uL — AB (ref 3.80–5.20)
RDW: 15.4 % — AB (ref 11.5–14.5)
WBC: 6.7 10*3/uL (ref 3.6–11.0)

## 2016-04-10 LAB — BASIC METABOLIC PANEL
ANION GAP: 5 (ref 5–15)
BUN: 13 mg/dL (ref 6–20)
CALCIUM: 8.2 mg/dL — AB (ref 8.9–10.3)
CO2: 29 mmol/L (ref 22–32)
Chloride: 105 mmol/L (ref 101–111)
Creatinine, Ser: 0.77 mg/dL (ref 0.44–1.00)
GLUCOSE: 103 mg/dL — AB (ref 65–99)
POTASSIUM: 4.1 mmol/L (ref 3.5–5.1)
Sodium: 139 mmol/L (ref 135–145)

## 2016-04-10 LAB — MRSA PCR SCREENING: MRSA BY PCR: NEGATIVE

## 2016-04-10 MED ORDER — ACETAMINOPHEN 650 MG RE SUPP: 650.0000 mg | Freq: Four times a day (QID) | RECTAL | Status: DC | PRN

## 2016-04-10 MED ORDER — PRIMIDONE 250 MG PO TABS
250.0000 mg | ORAL_TABLET | Freq: Two times a day (BID) | ORAL | Status: DC
  Administered 2016-04-10 – 2016-04-12 (×5): 250 mg via ORAL
  Filled 2016-04-10 (×5): qty 1

## 2016-04-10 MED ORDER — HYDROCODONE-ACETAMINOPHEN 5-325 MG PO TABS
1.0000 | ORAL_TABLET | Freq: Once | ORAL | Status: AC
  Administered 2016-04-10: 1 via ORAL
  Filled 2016-04-10: qty 1

## 2016-04-10 MED ORDER — ONDANSETRON HCL 4 MG PO TABS: 4.0000 mg | ORAL_TABLET | Freq: Four times a day (QID) | ORAL | Status: DC | PRN

## 2016-04-10 MED ORDER — ONDANSETRON HCL 4 MG/2ML IJ SOLN: 4.0000 mg | Freq: Four times a day (QID) | INTRAMUSCULAR | Status: DC | PRN

## 2016-04-10 MED ORDER — FENTANYL CITRATE (PF) 100 MCG/2ML IJ SOLN
50.0000 ug | Freq: Once | INTRAMUSCULAR | Status: AC
  Administered 2016-04-10: 50 ug via INTRAVENOUS
  Filled 2016-04-10: qty 2

## 2016-04-10 MED ORDER — ACETAMINOPHEN 325 MG PO TABS: 650.0000 mg | ORAL_TABLET | Freq: Four times a day (QID) | ORAL | Status: DC | PRN

## 2016-04-10 MED ORDER — ENOXAPARIN SODIUM 40 MG/0.4ML ~~LOC~~ SOLN
40.0000 mg | SUBCUTANEOUS | Status: DC
  Administered 2016-04-10 – 2016-04-11 (×2): 40 mg via SUBCUTANEOUS
  Filled 2016-04-10 (×2): qty 0.4

## 2016-04-10 MED ORDER — GABAPENTIN 300 MG PO CAPS
300.0000 mg | ORAL_CAPSULE | Freq: Two times a day (BID) | ORAL | Status: DC
  Administered 2016-04-10 – 2016-04-12 (×5): 300 mg via ORAL
  Filled 2016-04-10 (×5): qty 1

## 2016-04-10 MED ORDER — HYDROMORPHONE HCL 1 MG/ML IJ SOLN
1.0000 mg | INTRAMUSCULAR | Status: DC | PRN
  Administered 2016-04-10 (×3): 1 mg via INTRAVENOUS
  Administered 2016-04-11: 2 mg via INTRAVENOUS
  Administered 2016-04-11 (×3): 1 mg via INTRAVENOUS
  Administered 2016-04-12: 2 mg via INTRAVENOUS
  Filled 2016-04-10 (×3): qty 1
  Filled 2016-04-10 (×2): qty 2
  Filled 2016-04-10 (×3): qty 1

## 2016-04-10 MED ORDER — PANTOPRAZOLE SODIUM 40 MG PO TBEC
40.0000 mg | DELAYED_RELEASE_TABLET | Freq: Every day | ORAL | Status: DC
  Administered 2016-04-10 – 2016-04-12 (×3): 40 mg via ORAL
  Filled 2016-04-10 (×3): qty 1

## 2016-04-10 MED ORDER — HYDROCODONE-ACETAMINOPHEN 5-325 MG PO TABS
1.0000 | ORAL_TABLET | ORAL | Status: DC | PRN
  Administered 2016-04-10 (×2): 2 via ORAL
  Administered 2016-04-12 (×2): 1 via ORAL
  Filled 2016-04-10 (×2): qty 2
  Filled 2016-04-10 (×2): qty 1

## 2016-04-10 MED ORDER — CARBIDOPA-LEVODOPA 25-100 MG PO TABS
1.0000 | ORAL_TABLET | Freq: Two times a day (BID) | ORAL | Status: DC
  Administered 2016-04-10 – 2016-04-12 (×5): 1 via ORAL
  Filled 2016-04-10 (×5): qty 1

## 2016-04-10 MED ORDER — FENTANYL CITRATE (PF) 100 MCG/2ML IJ SOLN
INTRAMUSCULAR | Status: AC
  Filled 2016-04-10: qty 2

## 2016-04-10 MED ORDER — KETOROLAC TROMETHAMINE 15 MG/ML IJ SOLN
15.0000 mg | Freq: Four times a day (QID) | INTRAMUSCULAR | Status: DC | PRN
  Administered 2016-04-10: 15 mg via INTRAVENOUS
  Filled 2016-04-10: qty 1

## 2016-04-10 MED ORDER — CLONAZEPAM 0.5 MG PO TABS
0.5000 mg | ORAL_TABLET | Freq: Every day | ORAL | Status: DC
  Administered 2016-04-10: 0.5 mg via ORAL
  Filled 2016-04-10: qty 1

## 2016-04-10 MED ORDER — CARISOPRODOL 350 MG PO TABS
350.0000 mg | ORAL_TABLET | Freq: Two times a day (BID) | ORAL | Status: DC
  Administered 2016-04-10 – 2016-04-12 (×4): 350 mg via ORAL
  Filled 2016-04-10 (×4): qty 1

## 2016-04-10 MED ORDER — FENTANYL CITRATE (PF) 100 MCG/2ML IJ SOLN
75.0000 ug | Freq: Once | INTRAMUSCULAR | Status: AC
  Administered 2016-04-10: 75 ug via INTRAVENOUS

## 2016-04-10 MED ORDER — BUPROPION HCL ER (SR) 150 MG PO TB12
150.0000 mg | ORAL_TABLET | Freq: Two times a day (BID) | ORAL | Status: DC
  Administered 2016-04-10 – 2016-04-12 (×5): 150 mg via ORAL
  Filled 2016-04-10 (×6): qty 1

## 2016-04-10 NOTE — ED Triage Notes (Signed)
EMS pt to rm 23 from home. Pt reports she went outside with her walker to smoke and the walker fell to the side. Pt reports she fell on her right side. Now having pain in her right shoulder and right hip. Pt denies other injury. Pt has hx of parkinson's disease

## 2016-04-10 NOTE — ED Provider Notes (Signed)
[facility] Emergency Department Provider Note   ____________________________________________   First MD Initiated Contact with Patient 04/10/16 [phone]     (approximate)  I have reviewed the triage vital signs and the nursing notes.   HISTORY  Chief Complaint Fall    HPI Alexandria Rodriguez is a 66 y.o. female who comes into the hospital today with a fall. The patient went outside to smoke and her walker slipped from her grasp and she fell onto her right side. The patient fell on the concrete. She is having pain to her right shoulder and her right hip. The patient rates her pain a 10 out of 10 in intensity currently. The patient is unsteady at baseline due to a history of Parkinson's disease. The patient denies hitting her head when she fell. She reports that she also fell this morning and hit her right shoulder but it was inside on the carpet. The patient denies any loss of consciousness, chest pain, shortness of breath, abdominal pain. The patient is here today for evaluation.   Past Medical History:  Diagnosis Date  . Cerebrovascular disease   . COPD (chronic obstructive pulmonary disease) ([place])   . Depression   . Diverticulosis 10/31/10   On Tranverse colon  . GERD (gastroesophageal reflux disease)   . Myasthenia gravis ([place]) 02/2013   Diagnosed by doctor Manuella Ghazi  . Neuropathy ([place])   . Osteoporosis   . Vitamin D deficiency     Patient Active Problem List   Diagnosis Date Noted  . AD (atopic dermatitis) 06/01/2015  . Back pain, chronic 06/01/2015  . Compression fracture 06/01/2015  . 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 TMJ (temporomandibular joint disorder) 12/22/2014  . Diverticulosis 12/22/2014  . Depression 12/22/2014  . Cerebrovascular disease 12/22/2014  . GERD (gastroesophageal reflux disease) 12/22/2014  . Fibrocystic breast disease 12/22/2014  . Dyspnea 12/22/2014  . Tremor 12/22/2014  .  Radiculopathy of cervical region 12/22/2014  . Tobacco use 12/22/2014  . Abnormal loss of weight 12/22/2014  . Bloodgood disease 12/22/2014  . Diverticulitis 12/22/2014  . History of adenomatous polyp of colon 12/04/2014  . OP (osteoporosis) 12/04/2014  . B12 deficiency 11/04/2014  . Acquired polyneuropathy ([place]) 11/03/2014  . Abnormal barium swallow 10/02/2014  . Dysphagia 10/02/2014  . Anxiety 07/31/2013  . LBP (low back pain) 07/31/2013  . Vitamin D deficiency 07/31/2013  . COPD (chronic obstructive pulmonary disease) ([place]) 12/27/2012    Past Surgical History:  Procedure Laterality Date  . ABDOMINAL HYSTERECTOMY  2000   due to excessive bleeeding  . CERVICAL CONE BIOPSY    . Cervical discectomy and fusion  [facility]  . COLONOSCOPY WITH PROPOFOL N/A 12/06/2015   Procedure: COLONOSCOPY WITH PROPOFOL;  Surgeon: Manya Silvas, MD;  Location: [facility] ENDOSCOPY;  Service: Endoscopy;  Laterality: N/A;  . ESOPHAGOGASTRODUODENOSCOPY N/A 10/05/2014   Procedure: ESOPHAGOGASTRODUODENOSCOPY (EGD);  Surgeon: Manya Silvas, MD;  Location: [facility] ENDOSCOPY;  Service: Endoscopy;  Laterality: N/A;  . Repeat cuff surgery  2004   Developed adhesive capsulitis  . ROTATOR CUFF REPAIR Right 2003   Shoulder    Prior to Admission medications   Medication Sig Start Date End Date Taking? Authorizing Provider  aspirin 81 MG tablet Take 1 tablet by mouth daily.    Historical Provider, MD  buPROPion (WELLBUTRIN SR) 150 MG 12 hr tablet Take 1 tablet (150 mg total) by mouth 2 (two) times daily. 01/26/16   Elenore Rota  Courtney Heys, MD  carbidopa-levodopa (SINEMET IR) 25-100 MG per tablet Take 1 tablet by mouth 2 (two) times daily.     Historical Provider, MD  carisoprodol (SOMA) 350 MG tablet Take 1 tablet (350 mg total) by mouth 2 (two) times daily as needed. 01/26/16   Birdie Sons, MD  clonazePAM (KLONOPIN) 0.5 MG tablet TAKE 1 TABLET BY MOUTH AT BEDTIME AS NEEDED FOR ANXIETY.  01/26/16   Birdie Sons, MD  Cyanocobalamin (VITAMIN B 12 PO) Take 1,000 capsules by mouth daily.    Historical Provider, MD  ibuprofen (ADVIL,MOTRIN) 400 MG tablet TAKE 1 TABLET BY MOUTH  TWICE DAILY AS NEEDED FOR  BACK PAIN 03/15/16   Birdie Sons, MD  LYRICA 75 MG capsule TAKE ONE CAPSULE BY MOUTH TWICE DAILY 07/14/15   Birdie Sons, MD  meloxicam (MOBIC) 15 MG tablet Take 1 tablet (15 mg total) by mouth daily. Take with food 12/28/15   Birdie Sons, MD  omeprazole (PRILOSEC) 40 MG capsule Take 1 capsule (40 mg total) by mouth daily. 01/26/16   Birdie Sons, MD  primidone (MYSOLINE) 250 MG tablet Take 250 mg by mouth 2 (two) times daily.     Historical Provider, MD  triamcinolone ointment (KENALOG) 0.1 % APPLY TO AFFECTED AREA TWICE A DAY AS NEEDED. 03/26/15   Historical Provider, MD  Vitamin D, Cholecalciferol, 1000 units CAPS Take 1 capsule by mouth daily.    Historical Provider, MD  Vitamin D, Ergocalciferol, (DRISDOL) 50000 units CAPS capsule Take 1 capsule (50,000 Units total) by mouth every 7 (seven) days. 07/19/15   Birdie Sons, MD    Allergies Effexor xr [venlafaxine hcl er]; Fosamax [alendronate sodium]; Percocet [oxycodone-acetaminophen]; Erythromycin; Lidoderm [lidocaine]; Morphine and related; Sulfa antibiotics; and Tramadol  Family History  Problem Relation Age of Onset  . Cancer Sister     Ovarian, stable    Social History Social History  Substance Use Topics  . Smoking status: Current Every Day Smoker    Packs/day: 1.00    Types: Cigarettes  . Smokeless tobacco: Never Used     Comment:  Has been smoking for 30+ years, Cigarettes per day;10.  . Alcohol use No    Review of Systems Constitutional: No fever/chills Eyes: No visual changes. ENT: No sore throat. Cardiovascular: Denies chest pain. Respiratory: Denies shortness of breath. Gastrointestinal: No abdominal pain.  No nausea, no vomiting.  No diarrhea.  No constipation. Genitourinary: Negative  for dysuria. Musculoskeletal: Right shoulder pain, right hip pain Skin: Negative for rash. Neurological: Negative for headaches, focal weakness or numbness.  10-point ROS otherwise negative.  ____________________________________________   PHYSICAL EXAM:  VITAL SIGNS: ED Triage Vitals  Enc Vitals Group     BP 04/10/16 0414 (!) 151/76     Pulse Rate 04/10/16 0414 64     Resp 04/10/16 0414 (!) 22     Temp 04/10/16 0414 97.3 F (36.3 C)     Temp Source 04/10/16 0414 Oral     SpO2 04/10/16 0414 100 %     Weight 04/10/16 0414 103 lb (46.7 kg)     Height 04/10/16 0414 5\' 2"  (1.575 m)     Head Circumference --      Peak Flow --      Pain Score 04/10/16 0415 10     Pain Loc --      Pain Edu? --      Excl. in [place]? --    Constitutional: Alert and oriented. Well appearing  and in Moderate distress. Eyes: Conjunctivae are normal. PERRL. EOMI. Head: Atraumatic. Nose: No congestion/rhinnorhea. Mouth/Throat: Mucous membranes are moist.  Oropharynx non-erythematous. Neck: No cervical spine tenderness to palpation Cardiovascular: Normal rate, regular rhythm. Grossly normal heart sounds.  Good peripheral circulation. Respiratory: Normal respiratory effort.  No retractions. Lungs CTAB. Gastrointestinal: Soft and nontender. No distention. Positive bowel sounds Musculoskeletal: Right shoulder pain to palpation, right hip pain to palpation   Neurologic:  Normal speech and language.  Skin:  Skin is warm, dry and intact.  Psychiatric: Mood and affect are normal. Speech and behavior are normal.  ____________________________________________   LABS (all labs ordered are listed, but only abnormal results are displayed)  Labs Reviewed  CBC - Abnormal; Notable for the following:       Result Value   RBC 2.86 (*)    Hemoglobin 10.5 (*)    HCT 30.5 (*)    MCV 106.8 (*)    MCH 36.9 (*)    RDW 15.4 (*)    All other components within normal limits  BASIC METABOLIC PANEL - Abnormal; Notable for  the following:    Glucose, Bld 103 (*)    Calcium 8.2 (*)    All other components within normal limits   ____________________________________________  EKG  none ____________________________________________  RADIOLOGY  Right hip x-ray Right shoulder x-ray Right humerus x-ray ____________________________________________   PROCEDURES  Procedure(s) performed: None  Procedures  Critical Care performed: No  ____________________________________________   INITIAL IMPRESSION / ASSESSMENT AND PLAN / ED COURSE  Pertinent labs & imaging results that were available during my care of the patient were reviewed by me and considered in my medical decision making (see chart for details).  This is a 66 year old female with a history of Parkinson's disease who comes into the hospital today with some right shoulder pain. The patient did fall outside. It was a mechanical fall as she lost her walker and she went down. I did send the patient for an x-ray of her shoulder which showed a transverse fracture of the proximal humerus. I will give the patient a dose of fentanyl for her pain and check some blood work. The patient will be reassessed. Should she need 3 doses of pain medicine I will admit her to the hospitalist service.  Clinical Course as of Apr 11 803  Mon Apr 10, 2016  [id number] Proximal right humeral fracture. DG Humerus Right [AW]  [id number] Mildly impacted transverse fracture of the proximal humerus. DG Shoulder Right [AW]  0538 Negative. DG Hip Unilat  With Pelvis 2-3 Views Right [AW]    Clinical Course User Index [AW] Loney Hering, MD   After the one dose of fentanyl the patient was still hurting so I did give her a second dose of fentanyl and I will also give her dose of Norco. I will place the patient in a shoulder immobilizer and I will reassess the patient.  The patient is having continued uncontrolled pain. I will give her a dose of fentanyl 75 g and I will admit the patient  to the hospitalist service for pain control.  ____________________________________________   FINAL CLINICAL IMPRESSION(S) / ED DIAGNOSES  Final diagnoses:  Other closed nondisplaced fracture of proximal end of right humerus, initial encounter      NEW MEDICATIONS STARTED DURING THIS VISIT:  New Prescriptions   No medications on file     Note:  This document was prepared using Dragon voice recognition software and may include unintentional dictation errors.  Loney Hering, MD 04/10/16 [phone]

## 2016-04-10 NOTE — Progress Notes (Signed)
On admission patient alert and oriented X4. Patient's husband at bedside. Patient complaining of pain in right arm 9 out of 10 especially when moving. Patient has some scabbing from contact dermitis on posterior lower legs. Patient says that she scratches it. Patient is current smoker, denied need for nicotine patch. Patient has tremors because of Parkinson's disease and says that she is unable to make a fist with her right hand because of her Parkinson's disease. Patient is NPO and waiting of Ortho consult.   Deri Fuelling, RN

## 2016-04-10 NOTE — Progress Notes (Signed)
PT Cancellation Note  Patient Details Name: Alexandria Rodriguez MRN: [id number] DOB: [birth year]   Cancelled Treatment:    Reason Eval/Treat Not Completed: Medical issues which prohibited therapy. Patient admitted with transverse proximal humeral fx. Patient has pending orthopedic consult. PT will hold on evaluation until after orthopedic consult is completed for [facility] clarification and POC updates.   Kerman Passey, PT, DPT    04/10/2016, 11:45 AM

## 2016-04-10 NOTE — H&P (Signed)
[facility] NAME: Alexandria Rodriguez    MR#:  [id number]  DATE OF BIRTH:  10-09-1950  DATE OF ADMISSION:  04/10/2016  PRIMARY CARE PHYSICIAN: Lelon Huh, MD   REQUESTING/REFERRING PHYSICIAN: Dr. Charlesetta Ivory  CHIEF COMPLAINT:   Chief Complaint  Patient presents with  . Fall    HISTORY OF PRESENT ILLNESS:  Alexandria Rodriguez  is a 66 y.o. female with a known history of Neuropathy, Parkinson's disease, arthritis, vitamin D deficiency, COPD presents to the hospital secondary to fall and right humerus fracture. Patient says that she has been unsteady due to her Parkinson's disease. She has been using a walker at home. She has had several falls related to the same. Today she was in her normal state of health and was going outside to smoke and suddenly she fell onto her right side and her walker rolled over her. Denies any syncopal episode. No chest pain prior to the fall or other symptoms. She has been complaining of right arm pain since the fall and x-ray showed proximal right humeral fracture. She is currently in a sling. Admitted for pain control and orthopedic consultation. Denies any fevers or chills. No nausea or vomiting. Has chronic neuropathy.  PAST MEDICAL HISTORY:   Past Medical History:  Diagnosis Date  . Cerebrovascular disease   . Contact dermatitis   . COPD (chronic obstructive pulmonary disease) ([place])   . Depression   . Diverticulosis 10/31/10   On Tranverse colon  . GERD (gastroesophageal reflux disease)   . Myasthenia gravis ([place]) 02/2013   Diagnosed by doctor Manuella Ghazi  . Neuropathy ([place])   . Osteoporosis   . Parkinson's disease (tremor, stiffness, slow motion, unstable posture) ([place])   . Stroke ([place])   . Vitamin D deficiency     PAST SURGICAL HISTORY:   Past Surgical History:  Procedure Laterality Date  . ABDOMINAL HYSTERECTOMY  2000   due to excessive bleeeding  . CERVICAL CONE BIOPSY    . Cervical discectomy and  fusion  [facility]  . COLONOSCOPY WITH PROPOFOL N/A 12/06/2015   Procedure: COLONOSCOPY WITH PROPOFOL;  Surgeon: Manya Silvas, MD;  Location: [facility] ENDOSCOPY;  Service: Endoscopy;  Laterality: N/A;  . ESOPHAGOGASTRODUODENOSCOPY N/A 10/05/2014   Procedure: ESOPHAGOGASTRODUODENOSCOPY (EGD);  Surgeon: Manya Silvas, MD;  Location: [facility] ENDOSCOPY;  Service: Endoscopy;  Laterality: N/A;  . Repeat cuff surgery  2004   Developed adhesive capsulitis  . ROTATOR CUFF REPAIR Right 2003   Shoulder    SOCIAL HISTORY:   Social History  Substance Use Topics  . Smoking status: Current Every Day Smoker    Packs/day: 1.00    Types: Cigarettes  . Smokeless tobacco: Never Used     Comment:  Has been smoking for 30+ years, Cigarettes per day;10.  . Alcohol use No    FAMILY HISTORY:   Family History  Problem Relation Age of Onset  . Colon cancer Mother   . Stroke Father   . Cancer Sister     Ovarian, stable    DRUG ALLERGIES:   Allergies  Allergen Reactions  . Effexor Xr [Venlafaxine Hcl Er] Other (See Comments)    Reaction:  GI upset   . Fosamax [Alendronate Sodium] Nausea And Vomiting  . Percocet [Oxycodone-Acetaminophen] Swelling  . Erythromycin Swelling and Rash    Pt states that her tongue swells.    Katrinka Blazing [Lidocaine] Swelling and Rash  . Morphine And  Related Swelling, Rash and Other (See Comments)    Pt states that her tongue swells.    . Sulfa Antibiotics Swelling and Rash  . Tramadol Swelling and Rash    REVIEW OF SYSTEMS:   Review of Systems  Constitutional: Negative for chills, fever and weight loss.  HENT: Negative for ear discharge, ear pain, hearing loss and nosebleeds.   Eyes: Negative for blurred vision and double vision.  Respiratory: Negative for cough, hemoptysis, shortness of breath and wheezing.   Cardiovascular: Negative for chest pain, palpitations, orthopnea and leg swelling.  Gastrointestinal: Negative for abdominal  pain, constipation, diarrhea, melena, nausea and vomiting.  Genitourinary: Negative for dysuria, frequency, hematuria and urgency.  Musculoskeletal: Positive for back pain, falls and myalgias. Negative for neck pain.  Skin: Negative for rash.  Neurological: Positive for dizziness and tremors. Negative for tingling, sensory change, speech change, focal weakness and headaches.  Endo/Heme/Allergies: Does not bruise/bleed easily.  Psychiatric/Behavioral: Negative for depression.    MEDICATIONS AT HOME:   Prior to Admission medications   Medication Sig Start Date End Date Taking? Authorizing Provider  buPROPion (WELLBUTRIN SR) 150 MG 12 hr tablet Take 1 tablet (150 mg total) by mouth 2 (two) times daily. 01/26/16  Yes Birdie Sons, MD  carbidopa-levodopa (SINEMET IR) 25-100 MG per tablet Take 1 tablet by mouth 2 (two) times daily.    Yes Historical Provider, MD  carisoprodol (SOMA) 350 MG tablet Take 1 tablet (350 mg total) by mouth 2 (two) times daily as needed. 01/26/16  Yes Birdie Sons, MD  clonazePAM (KLONOPIN) 0.5 MG tablet TAKE 1 TABLET BY MOUTH AT BEDTIME AS NEEDED FOR ANXIETY. 01/26/16  Yes Birdie Sons, MD  ibuprofen (ADVIL,MOTRIN) 400 MG tablet TAKE 1 TABLET BY MOUTH  TWICE DAILY AS NEEDED FOR  BACK PAIN 03/15/16  Yes Birdie Sons, MD  omeprazole (PRILOSEC) 40 MG capsule Take 1 capsule (40 mg total) by mouth daily. 01/26/16  Yes Birdie Sons, MD  primidone (MYSOLINE) 250 MG tablet Take 250 mg by mouth 2 (two) times daily.    Yes Historical Provider, MD  triamcinolone ointment (KENALOG) 0.1 % APPLY TO AFFECTED AREA TWICE A DAY AS NEEDED. 03/26/15  Yes Historical Provider, MD  LYRICA 75 MG capsule TAKE ONE CAPSULE BY MOUTH TWICE DAILY Patient not taking: Reported on 04/10/2016 07/14/15   Birdie Sons, MD  Vitamin D, Ergocalciferol, (DRISDOL) 50000 units CAPS capsule Take 1 capsule (50,000 Units total) by mouth every 7 (seven) days. Patient not taking: Reported on 04/10/2016  07/19/15   Birdie Sons, MD      VITAL SIGNS:  Blood pressure (!) 121/46, pulse 76, temperature 97.3 F (36.3 C), temperature source Oral, resp. rate 18, height 5\' 2"  (1.575 m), weight 46.7 kg (103 lb), SpO2 100 %.  PHYSICAL EXAMINATION:   Physical Exam  GENERAL:  66 y.o.-year-old thin built patient lying in the bed with no acute distress.  EYES: Pupils equal, round, reactive to light and accommodation. No scleral icterus. Extraocular muscles intact.  HEENT: Head atraumatic, normocephalic. Oropharynx and nasopharynx clear.  NECK:  Supple, no jugular venous distention. No thyroid enlargement, no tenderness.  LUNGS: Normal breath sounds bilaterally, no wheezing, rales,rhonchi or crepitation. No use of accessory muscles of respiration.  CARDIOVASCULAR: S1, S2 normal. No murmurs, rubs, or gallops.  ABDOMEN: Soft, nontender, nondistended. Bowel sounds present. No organomegaly or mass.  EXTREMITIES: right arm in a sling, tremors of hands and some head nod noted. No pedal edema,  cyanosis, or clubbing.  NEUROLOGIC: Cranial nerves II through XII are intact. Muscle strength 5/5 in all extremities except right arm due to pain. Sensation intact. Gait not checked.  PSYCHIATRIC: The patient is alert and oriented x 3.  SKIN: No obvious rash, lesion, or ulcer.   LABORATORY PANEL:   CBC  Recent Labs Lab 04/10/16 0523  WBC 6.7  HGB 10.5*  HCT 30.5*  PLT 178   ------------------------------------------------------------------------------------------------------------------  Chemistries   Recent Labs Lab 04/10/16 0523  NA 139  K 4.1  CL 105  CO2 29  GLUCOSE 103*  BUN 13  CREATININE 0.77  CALCIUM 8.2*   ------------------------------------------------------------------------------------------------------------------  Cardiac Enzymes No results for input(s): TROPONINI in the last 168  hours. ------------------------------------------------------------------------------------------------------------------  RADIOLOGY:  Dg Shoulder Right  Result Date: 04/10/2016 CLINICAL DATA:  Ground level fall tonight EXAM: RIGHT SHOULDER - 2+ VIEW COMPARISON:  None. FINDINGS: There is an acute fracture of the proximal right humerus with mild impaction and slight angulation. No dislocation. No radiographic findings to suggest a pathologic basis for the fracture. IMPRESSION: Mildly impacted transverse fracture of the proximal humerus. Electronically Signed   By: Andreas Newport M.D.   On: 04/10/2016 04:56   Dg Humerus Right  Result Date: 04/10/2016 CLINICAL DATA:  Ground level fall tonight EXAM: RIGHT HUMERUS - 2+ VIEW COMPARISON:  None. FINDINGS: There is a transverse fracture across the surgical neck of humerus with mild impaction. No other acute bony abnormality is evident. Remainder of the humerus is intact. IMPRESSION: Proximal right humeral fracture. Electronically Signed   By: Andreas Newport M.D.   On: 04/10/2016 04:56   Dg Hip Unilat  With Pelvis 2-3 Views Right  Result Date: 04/10/2016 CLINICAL DATA:  Ground level fall tonight. EXAM: DG HIP (WITH OR WITHOUT PELVIS) 2-3V RIGHT COMPARISON:  None. FINDINGS: There is no evidence of hip fracture or dislocation. There is no evidence of arthropathy or other focal bone abnormality. IMPRESSION: Negative. Electronically Signed   By: Andreas Newport M.D.   On: 04/10/2016 04:55    EKG:   Orders placed or performed in visit on 05/13/13  . EKG 12-Lead    IMPRESSION AND PLAN:   Alexandria Rodriguez  is a 66 y.o. female with a known history of Neuropathy, Parkinson's disease, arthritis, vitamin D deficiency, COPD presents to the hospital secondary to fall and right humerus fracture.  #1 fall and right humeral fracture-currently in a sling. Admitted under observation for pain control. -Orthopedic consultation, pain medications. -Physical therapy  consult after orthopedic evaluation. -Uses a walker at baseline.  #2 Parkinson's disease-follows with neurology as outpatient. Continue Sinemet. Also on primidone for essential tremors  #3 depression and anxiety-continue outpatient medications. Patient on Wellbutrin, Klonopin  #4 DVT prophylaxis-on Lovenox    All the records are reviewed and case discussed with ED provider. Management plans discussed with the patient, family and they are in agreement.  CODE STATUS: Full Code  TOTAL TIME TAKING CARE OF THIS PATIENT: 50 minutes.    Gladstone Lighter M.D on 04/10/2016 at 2:46 PM  Between 7am to 6pm - Pager - [phone]  After 6pm go to [email or web address] - password [email or web address] [facility]  Office  [phone]  CC: Primary care physician; Lelon Huh, MD

## 2016-04-10 NOTE — Consult Note (Signed)
ORTHOPAEDIC CONSULTATION  PATIENT NAME: Alexandria Rodriguez DOB: 1950/10/11  MRN: [id number]  REQUESTING PHYSICIAN: Gladstone Lighter, MD  Chief Complaint: Right shoulder pain  HPI: Alexandria Rodriguez is a 66 y.o. female seen in consultation at the request of Gladstone Lighter, MD for a right proximal humerus fracture.  She was outside smoking at 0330 this morning when she tripped and fell.  She has Parkinson's disease and has been falling a lot recently.  She recently started using a walker because of the recurrent falls.   Past Medical History:  Diagnosis Date  . Cerebrovascular disease   . Contact dermatitis   . COPD (chronic obstructive pulmonary disease) ([place])   . Depression   . Diverticulosis 10/31/10   On Tranverse colon  . GERD (gastroesophageal reflux disease)   . Myasthenia gravis ([place]) 02/2013   Diagnosed by doctor Manuella Ghazi  . Neuropathy ([place])   . Osteoporosis   . Parkinson's disease (tremor, stiffness, slow motion, unstable posture) ([place])   . Stroke ([place])   . Vitamin D deficiency    Past Surgical History:  Procedure Laterality Date  . ABDOMINAL HYSTERECTOMY  2000   due to excessive bleeeding  . CERVICAL CONE BIOPSY    . Cervical discectomy and fusion  [facility]  . COLONOSCOPY WITH PROPOFOL N/A 12/06/2015   Procedure: COLONOSCOPY WITH PROPOFOL;  Surgeon: Manya Silvas, MD;  Location[facility] ENDOSCOPY;  Service: Endoscopy;  Laterality: N/A;  . ESOPHAGOGASTRODUODENOSCOPY N/A 10/05/2014   Procedure: ESOPHAGOGASTRODUODENOSCOPY (EGD);  Surgeon: Manya Silvas, MD;  Location: [facility] ENDOSCOPY;  Service: Endoscopy;  Laterality: N/A;  . Repeat cuff surgery  2004   Developed adhesive capsulitis  . ROTATOR CUFF REPAIR Right 2003   Shoulder   Social History   Social History  . Marital status: Married    Spouse name: N/A  . Number of children: N/A  . Years of education: N/A   Social History Main Topics  . Smoking status: Current Every Day Smoker   Packs/day: 1.00    Types: Cigarettes  . Smokeless tobacco: Never Used     Comment:  Has been smoking for 30+ years, Cigarettes per day;10.  . Alcohol use No  . Drug use: No  . Sexual activity: Not Asked   Other Topics Concern  . None   Social History Narrative   Lives at home with family. Ambulates with a walker due to unsteadiness and falls.   Family History  Problem Relation Age of Onset  . Colon cancer Mother   . Stroke Father   . Cancer Sister     Ovarian, stable   Allergies  Allergen Reactions  . Effexor Xr [Venlafaxine Hcl Er] Other (See Comments)    Reaction:  GI upset   . Fosamax [Alendronate Sodium] Nausea And Vomiting  . Percocet [Oxycodone-Acetaminophen] Swelling  . Erythromycin Swelling and Rash    Pt states that her tongue swells.    Katrinka Blazing [Lidocaine] Swelling and Rash  . Morphine And Related Swelling, Rash and Other (See Comments)    Pt states that her tongue swells.    . Sulfa Antibiotics Swelling and Rash  . Tramadol Swelling and Rash   Prior to Admission medications   Medication Sig Start Date End Date Taking? Authorizing Provider  buPROPion (WELLBUTRIN SR) 150 MG 12 hr tablet Take 1 tablet (150 mg total) by mouth 2 (two) times daily. 01/26/16  Yes Birdie Sons, MD  carbidopa-levodopa (SINEMET IR) 25-100 MG per tablet Take  1 tablet by mouth 2 (two) times daily.    Yes Historical Provider, MD  carisoprodol (SOMA) 350 MG tablet Take 1 tablet (350 mg total) by mouth 2 (two) times daily as needed. 01/26/16  Yes Birdie Sons, MD  clonazePAM (KLONOPIN) 0.5 MG tablet TAKE 1 TABLET BY MOUTH AT BEDTIME AS NEEDED FOR ANXIETY. 01/26/16  Yes Birdie Sons, MD  gabapentin (NEURONTIN) 300 MG capsule Take 300 mg by mouth 2 (two) times daily.   Yes Historical Provider, MD  ibuprofen (ADVIL,MOTRIN) 400 MG tablet TAKE 1 TABLET BY MOUTH  TWICE DAILY AS NEEDED FOR  BACK PAIN 03/15/16  Yes Birdie Sons, MD  omeprazole (PRILOSEC) 40 MG capsule Take 1 capsule (40  mg total) by mouth daily. 01/26/16  Yes Birdie Sons, MD  primidone (MYSOLINE) 250 MG tablet Take 250 mg by mouth 2 (two) times daily.    Yes Historical Provider, MD  triamcinolone ointment (KENALOG) 0.1 % APPLY TO AFFECTED AREA TWICE A DAY AS NEEDED. 03/26/15  Yes Historical Provider, MD  LYRICA 75 MG capsule TAKE ONE CAPSULE BY MOUTH TWICE DAILY Patient not taking: Reported on 04/10/2016 07/14/15   Birdie Sons, MD  Vitamin D, Ergocalciferol, (DRISDOL) 50000 units CAPS capsule Take 1 capsule (50,000 Units total) by mouth every 7 (seven) days. Patient not taking: Reported on 04/10/2016 07/19/15   Birdie Sons, MD   Dg Shoulder Right  Result Date: 04/10/2016 CLINICAL DATA:  Ground level fall tonight EXAM: RIGHT SHOULDER - 2+ VIEW COMPARISON:  None. FINDINGS: There is an acute fracture of the proximal right humerus with mild impaction and slight angulation. No dislocation. No radiographic findings to suggest a pathologic basis for the fracture. IMPRESSION: Mildly impacted transverse fracture of the proximal humerus. Electronically Signed   By: Andreas Newport M.D.   On: 04/10/2016 04:56   Dg Humerus Right  Result Date: 04/10/2016 CLINICAL DATA:  Ground level fall tonight EXAM: RIGHT HUMERUS - 2+ VIEW COMPARISON:  None. FINDINGS: There is a transverse fracture across the surgical neck of humerus with mild impaction. No other acute bony abnormality is evident. Remainder of the humerus is intact. IMPRESSION: Proximal right humeral fracture. Electronically Signed   By: Andreas Newport M.D.   On: 04/10/2016 04:56   Dg Hip Unilat  With Pelvis 2-3 Views Right  Result Date: 04/10/2016 CLINICAL DATA:  Ground level fall tonight. EXAM: DG HIP (WITH OR WITHOUT PELVIS) 2-3V RIGHT COMPARISON:  None. FINDINGS: There is no evidence of hip fracture or dislocation. There is no evidence of arthropathy or other focal bone abnormality. IMPRESSION: Negative. Electronically Signed   By: Andreas Newport M.D.   On:  04/10/2016 04:55    Positive ROS: All other systems have been reviewed and were otherwise negative with the exception of those mentioned in the HPI and as above.  Physical Exam: General: Alert and alert in no acute distress. Skin: No lesions in the area of chief complaint Neurologic: Awake, alert, and oriented. Sensory function is grossly intact. Motor strength is felt to be 5 over 5 bilaterally. No clonus or tremor. Good motor coordination. MUSCULOSKELETAL: No deformity to R arm.  Palpable radial pulse.  Intact EPL, FPL, IO function.  Intact sensation in R hand.  Intact sensation over deltoid.  Assessment: 66 year old female with COPD and Parkinson's disease with right 2 part proximal humerus fracture  Plan: - NWB R arm - She may keep the sling or come out of the sling, depending on  what she finds comfortable - Follow up in 2 weeks for radiographs.  I mentioned to her that I was on call for Dr. Donney Rankins, however she has previously seen Dr. Rudene Christians.  Zikeria Keough K. Page Spiro, MD

## 2016-04-10 NOTE — ED Notes (Signed)
Pt reports pain increasing again, Dr Dahlia Client informed.

## 2016-04-10 NOTE — ED Notes (Signed)
Removed necklace, bracelet and ear rings from patient. Placed in specimen cup and given to husband.

## 2016-04-11 DIAGNOSIS — S42309A Unspecified fracture of shaft of humerus, unspecified arm, initial encounter for closed fracture: Secondary | ICD-10-CM | POA: Diagnosis not present

## 2016-04-11 DIAGNOSIS — S42294A Other nondisplaced fracture of upper end of right humerus, initial encounter for closed fracture: Secondary | ICD-10-CM | POA: Diagnosis not present

## 2016-04-11 DIAGNOSIS — M25551 Pain in right hip: Secondary | ICD-10-CM | POA: Diagnosis not present

## 2016-04-11 DIAGNOSIS — G20 Parkinson's disease: Secondary | ICD-10-CM | POA: Diagnosis not present

## 2016-04-11 DIAGNOSIS — M1990 Unspecified osteoarthritis, unspecified site: Secondary | ICD-10-CM | POA: Diagnosis not present

## 2016-04-11 DIAGNOSIS — S7001XA Contusion of right hip, initial encounter: Secondary | ICD-10-CM | POA: Diagnosis not present

## 2016-04-11 LAB — BASIC METABOLIC PANEL
ANION GAP: 5 (ref 5–15)
BUN: 14 mg/dL (ref 6–20)
CO2: 26 mmol/L (ref 22–32)
Calcium: 8 mg/dL — ABNORMAL LOW (ref 8.9–10.3)
Chloride: 104 mmol/L (ref 101–111)
Creatinine, Ser: 0.94 mg/dL (ref 0.44–1.00)
GFR calc Af Amer: >60 mL/min (ref >60)
GLUCOSE: 107 mg/dL — AB (ref 65–99)
POTASSIUM: 4.3 mmol/L (ref 3.5–5.1)
SODIUM: 135 mmol/L (ref 135–145)

## 2016-04-11 LAB — CBC
HEMATOCRIT: 25.7 % — AB (ref 35.0–47.0)
HEMOGLOBIN: 9 g/dL — AB (ref 12.0–16.0)
MCH: 37.5 pg — ABNORMAL HIGH (ref 26.0–34.0)
MCHC: 35 g/dL (ref 32.0–36.0)
MCV: 107.2 fL — ABNORMAL HIGH (ref 80.0–100.0)
Platelets: 152 10*3/uL (ref 150–440)
RBC: 2.4 MIL/uL — AB (ref 3.80–5.20)
RDW: 15 % — ABNORMAL HIGH (ref 11.5–14.5)
WBC: 3.8 10*3/uL (ref 3.6–11.0)

## 2016-04-11 MED ORDER — CLONAZEPAM 0.5 MG PO TABS: 0.5000 mg | ORAL_TABLET | Freq: Every evening | ORAL | Status: DC | PRN

## 2016-04-11 NOTE — Care Management (Signed)
CM assessment for discharge planning. Met with patient at bedside. She lives at home with her spouse. Uses a walker. Increase in falls recently due to weakness and hx of Parkinson's disease. Fell at home while going out to smoke. PT pending. PCP is Dr. Caryn Section. Following.

## 2016-04-11 NOTE — NC FL2 (Signed)
[place] LEVEL OF CARE SCREENING TOOL     IDENTIFICATION  Patient Name: Alexandria Rodriguez Birthdate: 26-Aug-1950 Sex: female Admission Date (Current Location): 04/10/2016  [facility] and [place] Number:  Engineering geologist and Address:  [facility], [address] [facility], [place] [address]      Provider Number: [id number]  Attending Physician Name and Address:  Nicholes Mango, MD  Relative Name and Phone Number:       Current Level of Care: Hospital Recommended Level of Care: [place] Prior Approval Number:    Date Approved/Denied:   PASRR Number:    Discharge Plan: SNF    Current Diagnoses: Patient Active Problem List   Diagnosis Date Noted  . Humerus fracture 04/10/2016  . AD (atopic dermatitis) 06/01/2015  . Back pain, chronic 06/01/2015  . Compression fracture 06/01/2015  . 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 TMJ (temporomandibular joint disorder) 12/22/2014  . Diverticulosis 12/22/2014  . Depression 12/22/2014  . Cerebrovascular disease 12/22/2014  . GERD (gastroesophageal reflux disease) 12/22/2014  . Fibrocystic breast disease 12/22/2014  . Dyspnea 12/22/2014  . Tremor 12/22/2014  . Radiculopathy of cervical region 12/22/2014  . Tobacco use 12/22/2014  . Abnormal loss of weight 12/22/2014  . Bloodgood disease 12/22/2014  . Diverticulitis 12/22/2014  . History of adenomatous polyp of colon 12/04/2014  . OP (osteoporosis) 12/04/2014  . B12 deficiency 11/04/2014  . Acquired polyneuropathy ([place]) 11/03/2014  . Abnormal barium swallow 10/02/2014  . Dysphagia 10/02/2014  . Anxiety 07/31/2013  . LBP (low back pain) 07/31/2013  . Vitamin D deficiency 07/31/2013  . COPD (chronic obstructive pulmonary disease) ([place]) 12/27/2012    Orientation RESPIRATION BLADDER Height & Weight     Self, Time, Situation, Place  Normal Continent Weight: 103 lb (46.7 kg) Height:  5'  2" (157.5 cm)  BEHAVIORAL SYMPTOMS/MOOD NEUROLOGICAL BOWEL NUTRITION STATUS   (none)  (none) Continent Diet (Regular Diet )  AMBULATORY STATUS COMMUNICATION OF NEEDS Skin   Extensive Assist Verbally Normal                       Personal Care Assistance Level of Assistance  Bathing, Feeding, Dressing Bathing Assistance: Limited assistance Feeding assistance: Independent Dressing Assistance: Limited assistance     Functional Limitations Info  Sight, Hearing, Speech Sight Info: Adequate Hearing Info: Adequate Speech Info: Adequate    SPECIAL CARE FACTORS FREQUENCY  PT (By licensed PT), OT (By licensed OT)     PT Frequency:  (5) OT Frequency:  (5)            Contractures      Additional Factors Info  Code Status, Allergies Code Status Info:  (Full Code. ) Allergies Info:  (Effexor Xr Venlafaxine Hcl Er, Fosamax Alendronate Sodium, Percocet Oxycodone-acetaminophen, Erythromycin, Lidoderm Lidocaine, Morphine And Related, Sulfa Antibiotics, Tramadol)           Current Medications (04/11/2016):  This is the current hospital active medication list Current Facility-Administered Medications  Medication Dose Route Frequency Provider Last Rate Last Dose  . acetaminophen (TYLENOL) tablet 650 mg  650 mg Oral Q6H PRN Gladstone Lighter, MD       Or  . acetaminophen (TYLENOL) suppository 650 mg  650 mg Rectal Q6H PRN Gladstone Lighter, MD      . buPROPion [facility] SR) 12 hr tablet 150 mg  150 mg Oral BID Gladstone Lighter, MD   150 mg at 04/11/16 0918  .  carbidopa-levodopa (SINEMET IR) 25-100 MG per tablet immediate release 1 tablet  1 tablet Oral BID Gladstone Lighter, MD   1 tablet at 04/11/16 0918  . carisoprodol (SOMA) tablet 350 mg  350 mg Oral BID Gladstone Lighter, MD   350 mg at 04/11/16 0918  . clonazePAM (KLONOPIN) tablet 0.5 mg  0.5 mg Oral QHS Gladstone Lighter, MD   0.5 mg at 04/10/16 2106  . enoxaparin (LOVENOX) injection 40 mg  40 mg Subcutaneous Q24H Gladstone Lighter, MD   40 mg at 04/10/16 1800  . gabapentin (NEURONTIN) capsule 300 mg  300 mg Oral BID Gladstone Lighter, MD   300 mg at 04/11/16 0918  . HYDROcodone-acetaminophen (NORCO/VICODIN) 5-325 MG per tablet 1-2 tablet  1-2 tablet Oral Q4H PRN Gladstone Lighter, MD   2 tablet at 04/10/16 2215  . HYDROmorphone (DILAUDID) injection 1-2 mg  1-2 mg Intravenous Q4H PRN Gladstone Lighter, MD   2 mg at 04/11/16 0918  . ondansetron (ZOFRAN) tablet 4 mg  4 mg Oral Q6H PRN Gladstone Lighter, MD       Or  . ondansetron (ZOFRAN) injection 4 mg  4 mg Intravenous Q6H PRN Gladstone Lighter, MD      . pantoprazole (PROTONIX) EC tablet 40 mg  40 mg Oral Daily Gladstone Lighter, MD   40 mg at 04/11/16 0918  . primidone (MYSOLINE) tablet 250 mg  250 mg Oral BID Gladstone Lighter, MD   250 mg at 04/11/16 [id number]     Discharge Medications: Please see discharge summary for a list of discharge medications.  Relevant Imaging Results:  Relevant Lab Results:   Additional Information  (SSN: [id number])  Sample, Veronia Beets, LCSW

## 2016-04-11 NOTE — Care Management Obs Status (Signed)
[place] NOTIFICATION   Patient Details  Name: Alexandria Rodriguez MRN: [id number] Date of Birth: 1950-10-18   Medicare Observation Status Notification Given:  Yes    Jolly Mango, RN 04/11/2016, 11:35 AM

## 2016-04-11 NOTE — Progress Notes (Signed)
[facility] NAME: Alexandria Rodriguez    MR#:  [id number]  DATE OF BIRTH:  30-Nov-1950  SUBJECTIVE:  CHIEF COMPLAINT:  Patient is complaining of right pain during my examination and in tears  REVIEW OF SYSTEMS:  CONSTITUTIONAL: No fever, fatigue or weakness.  EYES: No blurred or double vision.  EARS, NOSE, AND THROAT: No tinnitus or ear pain.  RESPIRATORY: No cough, shortness of breath, wheezing or hemoptysis.  CARDIOVASCULAR: No chest pain, orthopnea, edema.  GASTROINTESTINAL: No nausea, vomiting, diarrhea or abdominal pain.  GENITOURINARY: No dysuria, hematuria.  ENDOCRINE: No polyuria, nocturia,  HEMATOLOGY: No anemia, easy bruising or bleeding SKIN: No rash or lesion. MUSCULOSKELETAL: Reporting right shoulder pain  NEUROLOGIC: No tingling, numbness, weakness.  PSYCHIATRY: No anxiety or depression.   DRUG ALLERGIES:   Allergies  Allergen Reactions  . Effexor Xr [Venlafaxine Hcl Er] Other (See Comments)    Reaction:  GI upset   . Fosamax [Alendronate Sodium] Nausea And Vomiting  . Percocet [Oxycodone-Acetaminophen] Swelling  . Erythromycin Swelling and Rash    Pt states that her tongue swells.    Katrinka Blazing [Lidocaine] Swelling and Rash  . Morphine And Related Swelling, Rash and Other (See Comments)    Pt states that her tongue swells.    . Sulfa Antibiotics Swelling and Rash  . Tramadol Swelling and Rash    VITALS:  Blood pressure (!) 113/59, pulse 86, temperature 98.7 F (37.1 C), temperature source Oral, resp. rate 18, height 5\' 2"  (1.575 m), weight 46.7 kg (103 lb), SpO2 99 %.  PHYSICAL EXAMINATION:  GENERAL:  66 y.o.-year-old patient lying in the bed with no acute distress.  EYES: Pupils equal, round, reactive to light and accommodation. No scleral icterus. Extraocular muscles intact.  HEENT: Head atraumatic, normocephalic. Oropharynx and nasopharynx clear.  NECK:  Supple, no jugular venous distention. No  thyroid enlargement, no tenderness.  LUNGS: Normal breath sounds bilaterally, no wheezing, rales,rhonchi or crepitation. No use of accessory muscles of respiration.  CARDIOVASCULAR: S1, S2 normal. No murmurs, rubs, or gallops.  ABDOMEN: Soft, nontender, nondistended. Bowel sounds present. No organomegaly or mass.  EXTREMITIES: Right upper extremity in sling, tender and adducted No pedal edema, cyanosis, or clubbing.  NEUROLOGIC: Cranial nerves II through XII are intact. Muscle strength 5/5 in all extremities. Sensation intact. Gait not checked.  PSYCHIATRIC: The patient is alert and oriented x 3.  SKIN: No obvious rash, lesion, or ulcer.    LABORATORY PANEL:   CBC  Recent Labs Lab 04/11/16 0437  WBC 3.8  HGB 9.0*  HCT 25.7*  PLT 152   ------------------------------------------------------------------------------------------------------------------  Chemistries   Recent Labs Lab 04/11/16 0437  NA 135  K 4.3  CL 104  CO2 26  GLUCOSE 107*  BUN 14  CREATININE 0.94  CALCIUM 8.0*   ------------------------------------------------------------------------------------------------------------------  Cardiac Enzymes No results for input(s): TROPONINI in the last 168 hours. ------------------------------------------------------------------------------------------------------------------  RADIOLOGY:  Dg Shoulder Right  Result Date: 04/10/2016 CLINICAL DATA:  Ground level fall tonight EXAM: RIGHT SHOULDER - 2+ VIEW COMPARISON:  None. FINDINGS: There is an acute fracture of the proximal right humerus with mild impaction and slight angulation. No dislocation. No radiographic findings to suggest a pathologic basis for the fracture. IMPRESSION: Mildly impacted transverse fracture of the proximal humerus. Electronically Signed   By: Andreas Newport M.D.   On: 04/10/2016 04:56   Dg Humerus Right  Result Date: 04/10/2016 CLINICAL DATA:  Ground level fall tonight EXAM: RIGHT  HUMERUS -  2+ VIEW COMPARISON:  None. FINDINGS: There is a transverse fracture across the surgical neck of humerus with mild impaction. No other acute bony abnormality is evident. Remainder of the humerus is intact. IMPRESSION: Proximal right humeral fracture. Electronically Signed   By: Andreas Newport M.D.   On: 04/10/2016 04:56   Dg Hip Unilat  With Pelvis 2-3 Views Right  Result Date: 04/10/2016 CLINICAL DATA:  Ground level fall tonight. EXAM: DG HIP (WITH OR WITHOUT PELVIS) 2-3V RIGHT COMPARISON:  None. FINDINGS: There is no evidence of hip fracture or dislocation. There is no evidence of arthropathy or other focal bone abnormality. IMPRESSION: Negative. Electronically Signed   By: Andreas Newport M.D.   On: 04/10/2016 04:55    EKG:   Orders placed or performed in visit on 05/13/13  . EKG 12-Lead    ASSESSMENT AND PLAN:   Alexandria Rodriguez  is a 66 y.o. female with a known history of Neuropathy, Parkinson's disease, arthritis, vitamin D deficiency, COPD presents to the hospital secondary to fall and right humerus fracture.  #1 Mechanical fall and right humeral fracture-minimally displaced  currently in a sling. Nonweightbearing on the right shoulder -Pain management as needed -Orthopedics is not considering surgery -Physical therapy consult  -Uses a walker at baseline. -Right hip pain with no obvious fracture pain management as needed and PT consult -op ortho f/u in a week  #2 Parkinson's disease-follows with   neurology as outpatient. Continue Sinemet. Also on primidone for essential tremors  #3 depression and anxiety-continue outpatient medications. Patient on Wellbutrin, Klonopin  #4 DVT prophylaxis-on Lovenox      All the records are reviewed and case discussed with Care Management/Social Workerr. Management plans discussed with the patient, family and they are in agreement.  CODE STATUS: fc   TOTAL TIME TAKING CARE OF THIS PATIENT: 36  minutes.   POSSIBLE D/C IN 1-2 DAYS,  DEPENDING ON CLINICAL CONDITION.  Note: This dictation was prepared with Dragon dictation along with smaller phrase technology. Any transcriptional errors that result from this process are unintentional.   Nicholes Mango M.D on 04/11/2016 at 3:58 PM  Between 7am to 6pm - Pager - [phone] After 6pm go to [email or web address] - password [email or web address] [facility]  Office  [phone]  CC: Primary care physician; Lelon Huh, MD

## 2016-04-11 NOTE — Clinical Social Work Note (Signed)
Clinical Social Work Assessment  Alexandria Rodriguez Details  Name: Alexandria Rodriguez MRN: [id number] Date of Birth: [birth year]  Date of referral:  04/11/16               Reason for consult:  Facility Placement                Permission sought to share information with:  Chartered certified accountant granted to share information::  Yes, Verbal Permission Granted  Name::      [place]::   [place]   Relationship::     Contact Information:     Housing/Transportation Living arrangements for the past 2 months:  [place] of Information:  Alexandria Rodriguez, Alexandria Rodriguez Alexandria Rodriguez Interpreter Needed:  None Criminal Activity/Legal Involvement Pertinent to Current Situation/Hospitalization:  No - Comment as needed Significant Relationships:  Alexandria Rodriguez Lives with:  Spouse Do you feel safe going back to the place where you live?  Yes Need for family participation in Alexandria Rodriguez care:  Yes (Comment)  Care giving concerns:  Alexandria Rodriguez lives in [place] with her husband Runner, broadcasting/film/video.    Facilities manager / plan:  Holiday representative ([facility]) received SNF consult. PT is recommending SNF. CSW met with Alexandria Rodriguez and her husband [name] was at bedside. Alexandria Rodriguez was alert and oriented and sitting up in the bed. CSW introduced self and explained role of CSW department. Alexandria Rodriguez reported that she lives in [place] with her husband. CSW explained that PT is recommending SNF. Alexandria Rodriguez and husband are agreeable to SNF search and prefer [facility]. CSW explained SNF process. FL2 complete and faxed out. CSW will continue to follow and assist as needed.    Employment status:  Retired Nurse, adult PT Recommendations:  [place] / Referral to community resources:  [place]  Alexandria Rodriguez/Family's Response to care:  Alexandria Rodriguez and husband are agreeable to [facility] in [place].   Alexandria Rodriguez/Family's Understanding of and Emotional Response  to Diagnosis, Current Treatment, and Prognosis:  Alexandria Rodriguez and husband were pleasant and thanked CSW for assistance.   Emotional Assessment Appearance:  Appears stated age Attitude/Demeanor/Rapport:    Affect (typically observed):  Accepting, Adaptable, Pleasant Orientation:  Oriented to Self, Oriented to Place, Oriented to  Time, Oriented to Situation Alcohol / Substance use:  Not Applicable Psych involvement (Current and /or in the community):  No (Comment)  Discharge Needs  Concerns to be addressed:  Discharge Planning Concerns Readmission within the last 30 days:  No Current discharge risk:  Dependent with Mobility Barriers to Discharge:  Continued Medical Work up   [employer], Veronia Beets, LCSW 04/11/2016, 4:13 PM

## 2016-04-11 NOTE — Clinical Social Work Placement (Signed)
   CLINICAL SOCIAL WORK PLACEMENT  NOTE  Date:  04/11/2016  Patient Details  Name: Alexandria Rodriguez MRN: [id number] Date of Birth: 1950/09/28  Clinical Social Work is seeking post-discharge placement for this patient at the [place] level of care (*CSW will initial, date and re-position this form in  chart as items are completed):  Yes   Patient/family provided with [facility] Work Department's list of facilities offering this level of care within the geographic area requested by the patient (or if unable, by the patient's family).  Yes   Patient/family informed of their freedom to choose among providers that offer the needed level of care, that participate in Medicare, Medicaid or managed care program needed by the patient, have an available bed and are willing to accept the patient.  Yes   Patient/family informed of [facility]'s ownership interest in [facility] and [facility], as well as of the fact that they are under no obligation to receive care at these facilities.  PASRR submitted to EDS on 04/11/16     PASRR number received on 04/11/16     Existing PASRR number confirmed on       FL2 transmitted to all facilities in geographic area requested by pt/family on 04/11/16     FL2 transmitted to all facilities within larger geographic area on       Patient informed that his/her managed care company has contracts with or will negotiate with certain facilities, including the following:            Patient/family informed of bed offers received.  Patient chooses bed at       Physician recommends and patient chooses bed at      Patient to be transferred to   on  .  Patient to be transferred to facility by       Patient family notified on   of transfer.  Name of family member notified:        PHYSICIAN       Additional Comment:    _______________________________________________ Cashe Gatt, Veronia Beets, LCSW 04/11/2016, 4:12 PM

## 2016-04-11 NOTE — Evaluation (Signed)
Physical Therapy Evaluation Patient Details Name: Alexandria Rodriguez MRN: [id number] DOB: Jun 17, 1950 Today's Date: 04/11/2016   History of Present Illness  Pt admitted for R proximal transverse humeral fracture from fall. Per ortho consult, may be in sling or out, pedning comfort, NWB on R UE. Pt with history of neuropathy, Parkingsons diseases, arthritis, and COPD.   Clinical Impression  Pt is a pleasant 66 year old female who was admitted for fall resulting in R humeral fracture. Pt performs bed mobility with min assist, transfers with min/mod assist, and ambulation with with min assist using HW. Pt demonstrates deficits with pain/strength/mobility/endurance. Pt very sleepy/lethargic during evaluation, needed consistent cues to stay awake and attention to task. Pt confused, thinking she was at home and her husband was in the kitchen, able to be re-oriented with cues.  Pt reports she has had multiple falls at home. Pt is not at baseline level at this time, not safe to dc home. Would benefit from skilled PT to address above deficits and promote optimal return to PLOF; recommend transition to STR upon discharge from acute hospitalization.       Follow Up Recommendations SNF    Equipment Recommendations   [facility])    Recommendations for Other Services       Precautions / Restrictions Precautions Precautions: Fall;Shoulder Required Braces or Orthoses: Sling Restrictions Weight Bearing Restrictions: Yes RUE Weight Bearing: Non weight bearing      Mobility  Bed Mobility Overal bed mobility: Needs Assistance Bed Mobility: Supine to Sit     Supine to sit: Min assist     General bed mobility comments: assist for walking B LE off bed and trunk support during transfer. Once seated at EOB, pt able to sit with cga progressing to supervision. Needs cues for sequencing of movement  Transfers Overall transfer level: Needs assistance Equipment used: Hemi-walker Transfers: Sit to/from Stand Sit to  Stand: Min assist;Mod assist         General transfer comment: 2 attempts for upright standing as she has LOB during standing attempt. Once upright, pt cued for using HW for stability. Needs constant Hands on assist as she is unable to maintain balance with single extremity.   Ambulation/Gait Ambulation/Gait assistance: Min assist Ambulation Distance (Feet): 3 Feet Assistive device: Hemi-walker Gait Pattern/deviations: Step-to pattern     General Gait Details: Pt ambulated using HW and min assist to recliner with stagging steps and difficulty coordinating HW to the side as she prefers to place infront of person. Very unsteady, would recommend chair follow for longer ambulation.  Stairs            Wheelchair Mobility    Modified Rankin (Stroke Patients Only)       Balance Overall balance assessment: History of Falls;Needs assistance Sitting-balance support: Feet supported Sitting balance-Leahy Scale: Good     Standing balance support: Single extremity supported Standing balance-Leahy Scale: Poor                               Pertinent Vitals/Pain Pain Assessment: 0-10 Pain Score: 2  Pain Location: R shoulder Pain Descriptors / Indicators: Discomfort;Dull Pain Intervention(s): Limited activity within patient's tolerance;Premedicated before session    Home Living Family/patient expects to be discharged to:: Private residence Living Arrangements: Spouse/significant other Available Help at Discharge: Family;Available 24 hours/day Type of Home: House Home Access: Level entry     Home Layout: One level Home Equipment: Walker - 2 wheels  Prior Function Level of Independence: Independent with assistive device(s)         Comments: ambulates using RW, reports history of falls at home.     Hand Dominance        Extremity/Trunk Assessment   Upper Extremity Assessment Upper Extremity Assessment: Generalized weakness (L UE grossly 4/5; R UE  not tested, in sling)    Lower Extremity Assessment Lower Extremity Assessment: Generalized weakness (B LE grossly 4-/5)       Communication   Communication: No difficulties  Cognition Arousal/Alertness: Lethargic Behavior During Therapy: WFL for tasks assessed/performed Overall Cognitive Status: Impaired/Different from baseline                      General Comments      Exercises Other Exercises Other Exercises: seated ther-ex performed including B LE heel slides, SLRs, ankle pumps, and hip abd/add. All ther-ex performed x 10 reps with cga and cues to keeps eyes open secondary to fatigue.    Assessment/Plan    PT Assessment Patient needs continued PT services  PT Problem List Decreased strength;Decreased activity tolerance;Decreased balance;Decreased mobility;Decreased safety awareness;Decreased knowledge of precautions;Pain;Decreased knowledge of use of DME          PT Treatment Interventions DME instruction;Gait training;Therapeutic exercise    PT Goals (Current goals can be found in the Care Plan section)  Acute Rehab PT Goals Patient Stated Goal: to get stronger PT Goal Formulation: With patient Time For Goal Achievement: 04/25/16 Potential to Achieve Goals: Good    Frequency 7X/week   Barriers to discharge        Co-evaluation               End of Session Equipment Utilized During Treatment: Gait belt (shoulder sling) Activity Tolerance: Patient limited by fatigue Patient left: in chair;with chair alarm set;with nursing/sitter in room Nurse Communication: Mobility status    Functional Assessment Tool Used: clinical judgement Functional Limitation: Mobility: Walking and moving around Mobility: Walking and Moving Around Current Status [id number]): At least 60 percent but less than 80 percent impaired, limited or restricted Mobility: Walking and Moving Around Goal Status [phone]): At least 40 percent but less than 60 percent impaired, limited or  restricted    Time: 1008-1026 PT Time Calculation (min) (ACUTE ONLY): 18 min   Charges:   PT Evaluation $PT Eval Moderate Complexity: 1 Procedure PT Treatments $Therapeutic Exercise: 8-22 mins   PT G Codes:   PT G-Codes **NOT FOR INPATIENT CLASS** Functional Assessment Tool Used: clinical judgement Functional Limitation: Mobility: Walking and moving around Mobility: Walking and Moving Around Current Status [id number]): At least 60 percent but less than 80 percent impaired, limited or restricted Mobility: Walking and Moving Around Goal Status [phone]): At least 40 percent but less than 60 percent impaired, limited or restricted    Darica Goren 04/11/2016, 1:31 PM  Greggory Stallion, PT, DPT [phone]

## 2016-04-11 NOTE — Consult Note (Addendum)
ORTHOPAEDIC CONSULTATION  REQUESTING PHYSICIAN: Nicholes Mango, MD  Chief Complaint: Right proximal humerus fracture s/p fall  HPI: Alexandria Rodriguez is a 66 y.o. female who complains of  pain in the right shoulder after a fall at home. Patient has had a previous stroke as well as Parkinson's disease and the patient and her husband explaining that she is unsteady on her feet. She frequently will fall into the walls at home and occasionally fall down. Patient has safety concerns about being discharged home.  Past Medical History:  Diagnosis Date  . Cerebrovascular disease   . Contact dermatitis   . COPD (chronic obstructive pulmonary disease) ([place])   . Depression   . Diverticulosis 10/31/10   On Tranverse colon  . GERD (gastroesophageal reflux disease)   . Myasthenia gravis ([place]) 02/2013   Diagnosed by doctor Manuella Ghazi  . Neuropathy ([place])   . Osteoporosis   . Parkinson's disease (tremor, stiffness, slow motion, unstable posture) ([place])   . Stroke ([place])   . Vitamin D deficiency    Past Surgical History:  Procedure Laterality Date  . ABDOMINAL HYSTERECTOMY  2000   due to excessive bleeeding  . CERVICAL CONE BIOPSY    . Cervical discectomy and fusion  [facility]  . COLONOSCOPY WITH PROPOFOL N/A 12/06/2015   Procedure: COLONOSCOPY WITH PROPOFOL;  Surgeon: Manya Silvas, MD;  Location: [facility] ENDOSCOPY;  Service: Endoscopy;  Laterality: N/A;  . ESOPHAGOGASTRODUODENOSCOPY N/A 10/05/2014   Procedure: ESOPHAGOGASTRODUODENOSCOPY (EGD);  Surgeon: Manya Silvas, MD;  Location: [facility] ENDOSCOPY;  Service: Endoscopy;  Laterality: N/A;  . Repeat cuff surgery  2004   Developed adhesive capsulitis  . ROTATOR CUFF REPAIR Right 2003   Shoulder   Social History   Social History  . Marital status: Married    Spouse name: N/A  . Number of children: N/A  . Years of education: N/A   Social History Main Topics  . Smoking status: Current Every Day Smoker    Packs/day: 1.00     Types: Cigarettes  . Smokeless tobacco: Never Used     Comment:  Has been smoking for 30+ years, Cigarettes per day;10.  . Alcohol use No  . Drug use: No  . Sexual activity: Not Asked   Other Topics Concern  . None   Social History Narrative   Lives at home with family. Ambulates with a walker due to unsteadiness and falls.   Family History  Problem Relation Age of Onset  . Colon cancer Mother   . Stroke Father   . Cancer Sister     Ovarian, stable   Allergies  Allergen Reactions  . Effexor Xr [Venlafaxine Hcl Er] Other (See Comments)    Reaction:  GI upset   . Fosamax [Alendronate Sodium] Nausea And Vomiting  . Percocet [Oxycodone-Acetaminophen] Swelling  . Erythromycin Swelling and Rash    Pt states that her tongue swells.    Katrinka Blazing [Lidocaine] Swelling and Rash  . Morphine And Related Swelling, Rash and Other (See Comments)    Pt states that her tongue swells.    . Sulfa Antibiotics Swelling and Rash  . Tramadol Swelling and Rash   Prior to Admission medications   Medication Sig Start Date End Date Taking? Authorizing Provider  buPROPion (WELLBUTRIN SR) 150 MG 12 hr tablet Take 1 tablet (150 mg total) by mouth 2 (two) times daily. 01/26/16  Yes Birdie Sons, MD  carbidopa-levodopa (SINEMET IR) 25-100 MG per tablet Take 1  tablet by mouth 2 (two) times daily.    Yes Historical Provider, MD  carisoprodol (SOMA) 350 MG tablet Take 1 tablet (350 mg total) by mouth 2 (two) times daily as needed. 01/26/16  Yes Birdie Sons, MD  clonazePAM (KLONOPIN) 0.5 MG tablet TAKE 1 TABLET BY MOUTH AT BEDTIME AS NEEDED FOR ANXIETY. 01/26/16  Yes Birdie Sons, MD  gabapentin (NEURONTIN) 300 MG capsule Take 300 mg by mouth 2 (two) times daily.   Yes Historical Provider, MD  ibuprofen (ADVIL,MOTRIN) 400 MG tablet TAKE 1 TABLET BY MOUTH  TWICE DAILY AS NEEDED FOR  BACK PAIN 03/15/16  Yes Birdie Sons, MD  omeprazole (PRILOSEC) 40 MG capsule Take 1 capsule (40 mg total) by  mouth daily. 01/26/16  Yes Birdie Sons, MD  primidone (MYSOLINE) 250 MG tablet Take 250 mg by mouth 2 (two) times daily.    Yes Historical Provider, MD  triamcinolone ointment (KENALOG) 0.1 % APPLY TO AFFECTED AREA TWICE A DAY AS NEEDED. 03/26/15  Yes Historical Provider, MD  LYRICA 75 MG capsule TAKE ONE CAPSULE BY MOUTH TWICE DAILY Patient not taking: Reported on 04/10/2016 07/14/15   Birdie Sons, MD  Vitamin D, Ergocalciferol, (DRISDOL) 50000 units CAPS capsule Take 1 capsule (50,000 Units total) by mouth every 7 (seven) days. Patient not taking: Reported on 04/10/2016 07/19/15   Birdie Sons, MD   Dg Shoulder Right  Result Date: 04/10/2016 CLINICAL DATA:  Ground level fall tonight EXAM: RIGHT SHOULDER - 2+ VIEW COMPARISON:  None. FINDINGS: There is an acute fracture of the proximal right humerus with mild impaction and slight angulation. No dislocation. No radiographic findings to suggest a pathologic basis for the fracture. IMPRESSION: Mildly impacted transverse fracture of the proximal humerus. Electronically Signed   By: Andreas Newport M.D.   On: 04/10/2016 04:56   Dg Humerus Right  Result Date: 04/10/2016 CLINICAL DATA:  Ground level fall tonight EXAM: RIGHT HUMERUS - 2+ VIEW COMPARISON:  None. FINDINGS: There is a transverse fracture across the surgical neck of humerus with mild impaction. No other acute bony abnormality is evident. Remainder of the humerus is intact. IMPRESSION: Proximal right humeral fracture. Electronically Signed   By: Andreas Newport M.D.   On: 04/10/2016 04:56   Dg Hip Unilat  With Pelvis 2-3 Views Right  Result Date: 04/10/2016 CLINICAL DATA:  Ground level fall tonight. EXAM: DG HIP (WITH OR WITHOUT PELVIS) 2-3V RIGHT COMPARISON:  None. FINDINGS: There is no evidence of hip fracture or dislocation. There is no evidence of arthropathy or other focal bone abnormality. IMPRESSION: Negative. Electronically Signed   By: Andreas Newport M.D.   On: 04/10/2016 04:55     Positive ROS: All other systems have been reviewed and were otherwise negative with the exception of those mentioned in the HPI and as above.  Physical Exam: General: Drowsy but arousable, no acute distress  MUSCULOSKELETAL: Right upper extremity: Patient has intact skin overlying the right shoulder. There is early ecchymosis and swelling. Patient's right upper extremity is in an immobilizer. She has a palpable radial pulse. Her fingers are well-perfused. She can gently flex and extend all 5 digits of the right hand.   Right lower extremity: Patient has intact skin overlying the right hip. There is no shortening or other deformity of the right lower extremity. Distally she is neurovascular intact.  Assessment: #1 right proximal humerus fracture, minimally displaced   #2 right hip pain status post fall  Plan: I reviewed the  patient's x-rays from the ER. Patient's proximal humerus fracture is minimally displaced and at this time does not require surgical intervention. Continue use of the shoulder immobilizer. Patient will be nonweightbearing on the right upper extremity which will limit her use of a traditional walker. Patient has issues with gait stability his high risk for falling. I would like the patient to be reevaluated by physical therapy to determine whether she has safe for discharge home. It is possible she may require skilled nursing facility upon discharge.  In regards to her right hip there is no obvious fracture. There is no evidence of a femoral neck or intertrochanteric hip fracture. It is possible she has a nondisplaced fracture of the tip of the greater trochanter versus contusion to the right hip. She may weight-bear as tolerated on the right lower extremity. Patient may follow up in my office in 7-10 days for reevaluation after discharge.     Thornton Park, MD    04/11/2016 12:36 PM

## 2016-04-12 ENCOUNTER — Encounter
Admission: RE | Admit: 2016-04-12 | Discharge: 2016-04-12 | Disposition: A | Discharge status: Home / Self Care | Payer: Medicare Other | Source: Ambulatory Visit | Attending: Internal Medicine | Admitting: Internal Medicine

## 2016-04-12 DIAGNOSIS — M5412 Radiculopathy, cervical region: Secondary | ICD-10-CM | POA: Diagnosis not present

## 2016-04-12 DIAGNOSIS — X58XXXD Exposure to other specified factors, subsequent encounter: Secondary | ICD-10-CM | POA: Insufficient documentation

## 2016-04-12 DIAGNOSIS — R262 Difficulty in walking, not elsewhere classified: Secondary | ICD-10-CM | POA: Diagnosis not present

## 2016-04-12 DIAGNOSIS — M80021D Age-related osteoporosis with current pathological fracture, right humerus, subsequent encounter for fracture with routine healing: Secondary | ICD-10-CM | POA: Insufficient documentation

## 2016-04-12 DIAGNOSIS — G20 Parkinson's disease: Secondary | ICD-10-CM | POA: Diagnosis not present

## 2016-04-12 DIAGNOSIS — M6281 Muscle weakness (generalized): Secondary | ICD-10-CM | POA: Diagnosis not present

## 2016-04-12 DIAGNOSIS — Z79899 Other long term (current) drug therapy: Secondary | ICD-10-CM | POA: Diagnosis not present

## 2016-04-12 DIAGNOSIS — G8929 Other chronic pain: Secondary | ICD-10-CM | POA: Diagnosis not present

## 2016-04-12 DIAGNOSIS — G629 Polyneuropathy, unspecified: Secondary | ICD-10-CM | POA: Diagnosis not present

## 2016-04-12 DIAGNOSIS — Z7982 Long term (current) use of aspirin: Secondary | ICD-10-CM | POA: Diagnosis not present

## 2016-04-12 DIAGNOSIS — M549 Dorsalgia, unspecified: Secondary | ICD-10-CM | POA: Diagnosis not present

## 2016-04-12 DIAGNOSIS — M25551 Pain in right hip: Secondary | ICD-10-CM | POA: Diagnosis not present

## 2016-04-12 DIAGNOSIS — Z720 Tobacco use: Secondary | ICD-10-CM | POA: Diagnosis not present

## 2016-04-12 DIAGNOSIS — S42309A Unspecified fracture of shaft of humerus, unspecified arm, initial encounter for closed fracture: Secondary | ICD-10-CM | POA: Diagnosis not present

## 2016-04-12 DIAGNOSIS — K219 Gastro-esophageal reflux disease without esophagitis: Secondary | ICD-10-CM | POA: Diagnosis not present

## 2016-04-12 DIAGNOSIS — G7000 Myasthenia gravis without (acute) exacerbation: Secondary | ICD-10-CM | POA: Diagnosis not present

## 2016-04-12 DIAGNOSIS — S42201A Unspecified fracture of upper end of right humerus, initial encounter for closed fracture: Secondary | ICD-10-CM | POA: Diagnosis not present

## 2016-04-12 DIAGNOSIS — L209 Atopic dermatitis, unspecified: Secondary | ICD-10-CM | POA: Diagnosis not present

## 2016-04-12 DIAGNOSIS — M1990 Unspecified osteoarthritis, unspecified site: Secondary | ICD-10-CM | POA: Diagnosis not present

## 2016-04-12 DIAGNOSIS — R531 Weakness: Secondary | ICD-10-CM | POA: Diagnosis not present

## 2016-04-12 DIAGNOSIS — G8911 Acute pain due to trauma: Secondary | ICD-10-CM | POA: Diagnosis not present

## 2016-04-12 DIAGNOSIS — Z885 Allergy status to narcotic agent status: Secondary | ICD-10-CM | POA: Diagnosis not present

## 2016-04-12 DIAGNOSIS — M810 Age-related osteoporosis without current pathological fracture: Secondary | ICD-10-CM | POA: Diagnosis not present

## 2016-04-12 DIAGNOSIS — R296 Repeated falls: Secondary | ICD-10-CM | POA: Diagnosis not present

## 2016-04-12 DIAGNOSIS — G250 Essential tremor: Secondary | ICD-10-CM | POA: Diagnosis not present

## 2016-04-12 DIAGNOSIS — J449 Chronic obstructive pulmonary disease, unspecified: Secondary | ICD-10-CM | POA: Diagnosis not present

## 2016-04-12 DIAGNOSIS — I679 Cerebrovascular disease, unspecified: Secondary | ICD-10-CM | POA: Diagnosis not present

## 2016-04-12 DIAGNOSIS — Z8673 Personal history of transient ischemic attack (TIA), and cerebral infarction without residual deficits: Secondary | ICD-10-CM | POA: Diagnosis not present

## 2016-04-12 DIAGNOSIS — Z791 Long term (current) use of non-steroidal anti-inflammatories (NSAID): Secondary | ICD-10-CM | POA: Diagnosis not present

## 2016-04-12 MED ORDER — CLONAZEPAM 0.5 MG PO TABS: ORAL_TABLET | ORAL | 0 refills | Status: DC

## 2016-04-12 MED ORDER — BISACODYL 5 MG PO TBEC: 5.0000 mg | DELAYED_RELEASE_TABLET | Freq: Every day | ORAL | Status: DC | PRN

## 2016-04-12 MED ORDER — MAGNESIUM HYDROXIDE 400 MG/5ML PO SUSP
30.0000 mL | Freq: Two times a day (BID) | ORAL | Status: DC
  Administered 2016-04-12: 30 mL via ORAL
  Filled 2016-04-12: qty 30

## 2016-04-12 MED ORDER — ACETAMINOPHEN 325 MG PO TABS: 650.0000 mg | ORAL_TABLET | Freq: Four times a day (QID) | ORAL | Status: DC | PRN

## 2016-04-12 MED ORDER — HYDROCODONE-ACETAMINOPHEN 5-325 MG PO TABS: 1.0000 | ORAL_TABLET | ORAL | 0 refills | Status: DC | PRN

## 2016-04-12 NOTE — Progress Notes (Signed)
Spoke with Lisabeth Devoid, [facility] rep at [phone], to notify of non-emergent EMS transport.  Auth notification reference given as [id number].   Service date range good for 90 days starting from 04/12/16.   Geographical gap exception requested to determine if services can be considered at an in-network level.

## 2016-04-12 NOTE — Discharge Summary (Signed)
Alexandria Rodriguez: Alexandria Rodriguez    MR#:  [id number]  DATE OF BIRTH:  14-Nov-1950  DATE OF ADMISSION:  04/10/2016 ADMITTING PHYSICIAN: Gladstone Lighter, MD  DATE OF DISCHARGE: 04/12/16 PRIMARY CARE PHYSICIAN: Lelon Huh, MD    ADMISSION DIAGNOSIS:  Other closed nondisplaced fracture of proximal end of right humerus, initial encounter [S42.294A]  DISCHARGE DIAGNOSIS:  Active Problems:   Humerus fracture Chronic Parkinson's disease  SECONDARY DIAGNOSIS:   Past Medical History:  Diagnosis Date  . Cerebrovascular disease   . Contact dermatitis   . COPD (chronic obstructive pulmonary disease) ([place])   . Depression   . Diverticulosis 10/31/10   On Tranverse colon  . GERD (gastroesophageal reflux disease)   . Myasthenia gravis ([place]) 02/2013   Diagnosed by doctor Manuella Ghazi  . Neuropathy ([place])   . Osteoporosis   . Parkinson's disease (tremor, stiffness, slow motion, unstable posture) ([place])   . Stroke ([place])   . Vitamin D deficiency     HOSPITAL COURSE:  Brief history and physical :Fotini Bartol  is a 66 y.o. female with a known history of Neuropathy, Parkinson's disease, arthritis, vitamin D deficiency, COPD presents to the hospital secondary to fall and right humerus fracture. Patient says that she has been unsteady due to her Parkinson's disease. She has been using a walker at home. She has had several falls related to the same. Today she was in her normal state of health and was going outside to smoke and suddenly she fell onto her right side and her walker rolled over her. Denies any syncopal episode. No chest pain prior to the fall or other symptoms. She has been complaining of right arm pain since the fall and x-ray showed proximal right humeral fracture. She is currently in a sling. Admitted for pain control and orthopedic consultation. Denies any fevers or chills. No nausea or vomiting. Has chronic neuropathy. Review history and  physical for details  Hospital course #1 Mechanical fall and right humeral fracture-minimally displaced  currently in a sling. Nonweightbearing on the right shoulder -Pain management as needed -Orthopedics is not considering surgery -Physical therapy consult -recommended skilled nursing facility -Uses a walker at baseline. -Right hip pain with no obvious fracture pain management as needed and PT  -op ortho f/u in a week  #2 Parkinson's disease-follows withneurology as outpatient. Continue Sinemet. Also on primidone for essential tremors  #3 depression and anxiety-continue outpatient medications. Patient on Wellbutrin, Klonopin  #4 DVT prophylaxis-Lovenox subcutaneous given during the hospital course  #5. Tobacco abuse disorder counseled patient to quit smoking for 3-5 min  Patient is agreeable. Pt  will work on it after discharge   DISCHARGE CONDITIONS:   Stable   CONSULTS OBTAINED:  Treatment Team:  Eugenie Filler, MD   PROCEDURES  None   DRUG ALLERGIES:   Allergies  Allergen Reactions  . Effexor Xr [Venlafaxine Hcl Er] Other (See Comments)    Reaction:  GI upset   . Fosamax [Alendronate Sodium] Nausea And Vomiting  . Percocet [Oxycodone-Acetaminophen] Swelling  . Erythromycin Swelling and Rash    Pt states that her tongue swells.    Katrinka Blazing [Lidocaine] Swelling and Rash  . Morphine And Related Swelling, Rash and Other (See Comments)    Pt states that her tongue swells.    . Sulfa Antibiotics Swelling and Rash  . Tramadol Swelling and Rash    DISCHARGE MEDICATIONS:   Current Discharge Medication List  START taking these medications   Details  acetaminophen (TYLENOL) 325 MG tablet Take 2 tablets (650 mg total) by mouth every 6 (six) hours as needed for mild pain (or Fever >/= 101).    HYDROcodone-acetaminophen (NORCO/VICODIN) 5-325 MG tablet Take 1-2 tablets by mouth every 4 (four) hours as needed for moderate pain. Qty: 30 tablet, Refills: 0       CONTINUE these medications which have CHANGED   Details  clonazePAM (KLONOPIN) 0.5 MG tablet TAKE 1 TABLET BY MOUTH AT BEDTIME AS NEEDED FOR ANXIETY. Qty: 15 tablet, Refills: 0      CONTINUE these medications which have NOT CHANGED   Details  buPROPion (WELLBUTRIN SR) 150 MG 12 hr tablet Take 1 tablet (150 mg total) by mouth 2 (two) times daily. Qty: 180 tablet, Refills: 3    carbidopa-levodopa (SINEMET IR) 25-100 MG per tablet Take 1 tablet by mouth 2 (two) times daily.     carisoprodol (SOMA) 350 MG tablet Take 1 tablet (350 mg total) by mouth 2 (two) times daily as needed. Qty: 180 tablet, Refills: 1    gabapentin (NEURONTIN) 300 MG capsule Take 300 mg by mouth 2 (two) times daily.    ibuprofen (ADVIL,MOTRIN) 400 MG tablet TAKE 1 TABLET BY MOUTH  TWICE DAILY AS NEEDED FOR  BACK PAIN Qty: 180 tablet, Refills: 1    omeprazole (PRILOSEC) 40 MG capsule Take 1 capsule (40 mg total) by mouth daily. Qty: 90 capsule, Refills: 4    primidone (MYSOLINE) 250 MG tablet Take 250 mg by mouth 2 (two) times daily.     triamcinolone ointment (KENALOG) 0.1 % APPLY TO AFFECTED AREA TWICE A DAY AS NEEDED. Refills: 0    LYRICA 75 MG capsule TAKE ONE CAPSULE BY MOUTH TWICE DAILY Qty: 60 capsule, Refills: 5      STOP taking these medications     Vitamin D, Ergocalciferol, (DRISDOL) 50000 units CAPS capsule          DISCHARGE INSTRUCTIONS:   Follow-up with primary care physician at the facility in 2-3 days Follow-up with orthopedics in 2 weeks Continue physical therapy Continued right shoulder sling and nonweightbearing on the right shoulder  DIET:  Regular diet  DISCHARGE CONDITION:  Stable  ACTIVITY:  Activity as tolerated per PT  OXYGEN:  Home Oxygen: No.   Oxygen Delivery: room air  DISCHARGE LOCATION:  nursing home   If you experience worsening of your admission symptoms, develop shortness of breath, life threatening emergency, suicidal or homicidal thoughts you  must seek medical attention immediately by calling 911 or calling your MD immediately  if symptoms less severe.  You Must read complete instructions/literature along with all the possible adverse reactions/side effects for all the Medicines you take and that have been prescribed to you. Take any new Medicines after you have completely understood and accpet all the possible adverse reactions/side effects.   Please note  You were cared for by a hospitalist during your hospital stay. If you have any questions about your discharge medications or the care you received while you were in the hospital after you are discharged, you can call the unit and asked to speak with the hospitalist on call if the hospitalist that took care of you is not available. Once you are discharged, your primary care physician will handle any further medical issues. Please note that NO REFILLS for any discharge medications will be authorized once you are discharged, as it is imperative that you return to your primary care physician (  or establish a relationship with a primary care physician if you do not have one) for your aftercare needs so that they can reassess your need for medications and monitor your lab values.     Today  Chief Complaint  Patient presents with  . Fall   Patient's right shoulder pain is much better today, where insulin. Husband at bedside. Comfortable to go nursing home for rehabilitation  ROS:  CONSTITUTIONAL: Denies fevers, chills. Denies any fatigue, weakness.  EYES: Denies blurry vision, double vision, eye pain. EARS, NOSE, THROAT: Denies tinnitus, ear pain, hearing loss. RESPIRATORY: Denies cough, wheeze, shortness of breath.  CARDIOVASCULAR: Denies chest pain, palpitations, edema.  GASTROINTESTINAL: Denies nausea, vomiting, diarrhea, abdominal pain. Denies bright red blood per rectum. GENITOURINARY: Denies dysuria, hematuria. ENDOCRINE: Denies nocturia or thyroid problems. HEMATOLOGIC AND  LYMPHATIC: Denies easy bruising or bleeding. SKIN: Denies rash or lesion. MUSCULOSKELETAL: Right shoulder pain is better  NEUROLOGIC: Denies paralysis, paresthesias.  PSYCHIATRIC: Denies anxiety or depressive symptoms.   VITAL SIGNS:  Blood pressure (!) 94/43, pulse 77, temperature 98.4 F (36.9 C), temperature source Oral, resp. rate 18, height 5\' 2"  (1.575 m), weight 46.7 kg (103 lb), SpO2 92 %.  I/O:    Intake/Output Summary (Last 24 hours) at 04/12/16 1342 Last data filed at 04/12/16 0928  Gross per 24 hour  Intake              480 ml  Output                1 ml  Net              479 ml    PHYSICAL EXAMINATION:  GENERAL:  66 y.o.-year-old patient lying in the bed with no acute distress.  EYES: Pupils equal, round, reactive to light and accommodation. No scleral icterus. Extraocular muscles intact.  HEENT: Head atraumatic, normocephalic. Oropharynx and nasopharynx clear.  NECK:  Supple, no jugular venous distention. No thyroid enlargement, no tenderness.  LUNGS: Normal breath sounds bilaterally, no wheezing, rales,rhonchi or crepitation. No use of accessory muscles of respiration.  CARDIOVASCULAR: S1, S2 normal. No murmurs, rubs, or gallops.  ABDOMEN: Soft, non-tender, non-distended. Bowel sounds present. No organomegaly or mass.  EXTREMITIES: Right shoulder is tender, in a sling No pedal edema, cyanosis, or clubbing.  NEUROLOGIC: Cranial nerves II through XII are intact. Muscle strength 5/5 in all extremities. Sensation intact. Gait not checked.  PSYCHIATRIC: The patient is alert and oriented x 3.  SKIN: No obvious rash, lesion, or ulcer.   DATA REVIEW:   CBC  Recent Labs Lab 04/11/16 0437  WBC 3.8  HGB 9.0*  HCT 25.7*  PLT 152    Chemistries   Recent Labs Lab 04/11/16 0437  NA 135  K 4.3  CL 104  CO2 26  GLUCOSE 107*  BUN 14  CREATININE 0.94  CALCIUM 8.0*    Cardiac Enzymes No results for input(s): TROPONINI in the last 168 hours.  Microbiology  Results  Results for orders placed or performed during the hospital encounter of 04/10/16  MRSA PCR Screening     Status: None   Collection Time: 04/10/16 10:31 AM  Result Value Ref Range Status   MRSA by PCR NEGATIVE NEGATIVE Final    Comment:        The GeneXpert MRSA Assay (FDA approved for NASAL specimens only), is one component of a comprehensive MRSA colonization surveillance program. It is not intended to diagnose MRSA infection nor to guide or monitor treatment for  MRSA infections.     RADIOLOGY:  Dg Shoulder Right  Result Date: 04/10/2016 CLINICAL DATA:  Ground level fall tonight EXAM: RIGHT SHOULDER - 2+ VIEW COMPARISON:  None. FINDINGS: There is an acute fracture of the proximal right humerus with mild impaction and slight angulation. No dislocation. No radiographic findings to suggest a pathologic basis for the fracture. IMPRESSION: Mildly impacted transverse fracture of the proximal humerus. Electronically Signed   By: Andreas Newport M.D.   On: 04/10/2016 04:56   Dg Humerus Right  Result Date: 04/10/2016 CLINICAL DATA:  Ground level fall tonight EXAM: RIGHT HUMERUS - 2+ VIEW COMPARISON:  None. FINDINGS: There is a transverse fracture across the surgical neck of humerus with mild impaction. No other acute bony abnormality is evident. Remainder of the humerus is intact. IMPRESSION: Proximal right humeral fracture. Electronically Signed   By: Andreas Newport M.D.   On: 04/10/2016 04:56   Dg Hip Unilat  With Pelvis 2-3 Views Right  Result Date: 04/10/2016 CLINICAL DATA:  Ground level fall tonight. EXAM: DG HIP (WITH OR WITHOUT PELVIS) 2-3V RIGHT COMPARISON:  None. FINDINGS: There is no evidence of hip fracture or dislocation. There is no evidence of arthropathy or other focal bone abnormality. IMPRESSION: Negative. Electronically Signed   By: Andreas Newport M.D.   On: 04/10/2016 04:55    EKG:   Orders placed or performed in visit on 05/13/13  . EKG 12-Lead       Management plans discussed with the patient, family and they are in agreement.  CODE STATUS:     Code Status Orders        Start     Ordered   04/10/16 0957  Full code  Continuous     04/10/16 0956    Code Status History    Date Active Date Inactive Code Status Order ID Comments User Context   This patient has a current code status but no historical code status.      TOTAL TIME TAKING CARE OF THIS PATIENT: 45  minutes.   Note: This dictation was prepared with Dragon dictation along with smaller phrase technology. Any transcriptional errors that result from this process are unintentional.   @[facility] @  on 04/12/2016 at 1:42 PM  Between 7am to 6pm - Pager - [phone]  After 6pm go to [email or web address] - password [email or web address] [facility]  Office  [phone]  CC: Primary care physician; Lelon Huh, MD

## 2016-04-12 NOTE — Clinical Social Work Placement (Signed)
   CLINICAL SOCIAL WORK PLACEMENT  NOTE  Date:  04/12/2016  Patient Details  Name: Alexandria Rodriguez MRN: [id number] Date of Birth: 01-Aug-1950  Clinical Social Work is seeking post-discharge placement for this patient at the [place] level of care (*CSW will initial, date and re-position this form in  chart as items are completed):  Yes   Patient/family provided with [place] Work Department's list of facilities offering this level of care within the geographic area requested by the patient (or if unable, by the patient's family).  Yes   Patient/family informed of their freedom to choose among providers that offer the needed level of care, that participate in Medicare, Medicaid or managed care program needed by the patient, have an available bed and are willing to accept the patient.  Yes   Patient/family informed of [facility]'s ownership interest in [facility] and [facility], as well as of the fact that they are under no obligation to receive care at these facilities.  PASRR submitted to EDS on 04/11/16     PASRR number received on 04/11/16     Existing PASRR number confirmed on       FL2 transmitted to all facilities in geographic area requested by pt/family on 04/11/16     FL2 transmitted to all facilities within larger geographic area on       Patient informed that his/her managed care company has contracts with or will negotiate with certain facilities, including the following:        Yes   Patient/family informed of bed offers received.  Patient chooses bed at  [facility] )     Physician recommends and patient chooses bed at      Patient to be transferred to  [facility] ) on 04/12/16.  Patient to be transferred to facility by  (Patient's husband [name] will transport. )     Patient family notified on 04/12/16 of transfer.  Name of family member notified:   (Patient's husband [name] is at bedside and aware of D/C  today. )     PHYSICIAN       Additional Comment:    _______________________________________________ Liz Pinho, Veronia Beets, LCSW 04/12/2016, 2:00 PM

## 2016-04-12 NOTE — Progress Notes (Signed)
Physical Therapy Treatment Patient Details Name: Alexandria Rodriguez MRN: [id number] DOB: [birth year] Today's Date: 04/12/2016    History of Present Illness Pt admitted for R proximal transverse humeral fracture from fall. Per ortho consult, may be in sling or out, pedning comfort, NWB on R UE. Pt with history of neuropathy, Parkingsons diseases, arthritis, and COPD.     PT Comments    Pt is making good progress towards goals, with improved cognition this date. Pt agreeable to there-ex and ambulation in room. Still needs assist for use of HW with unsteadiness noted. Very unsafe during ambulation and needs cues for safe technique during there-ex. Pt in more pain during mobility than at rest.  Follow Up Recommendations  SNF     Equipment Recommendations       Recommendations for Other Services       Precautions / Restrictions Precautions Precautions: Fall;Shoulder Restrictions Weight Bearing Restrictions: Yes RUE Weight Bearing: Non weight bearing    Mobility  Bed Mobility Overal bed mobility: Needs Assistance Bed Mobility: Supine to Sit     Supine to sit: Min assist     General bed mobility comments: assist for walking B LE off bed and trunk support during transfer. Once seated at EOB, pt able to sit with cga progressing to supervision. Needs cues for sequencing of movement  Transfers Overall transfer level: Needs assistance Equipment used: Hemi-walker Transfers: Sit to/from Stand Sit to Stand: Min assist         General transfer comment: sit<>stand performed with HW with cues to push from seated position. Once standing at EOB, pt with unsteadiness with slight LOB in post direction. Hands on assist to regain balance.  Ambulation/Gait Ambulation/Gait assistance: Min assist Ambulation Distance (Feet): 30 Feet Assistive device: Hemi-walker Gait Pattern/deviations: Step-to pattern     General Gait Details: Unsteadiness with ambulation using HW and min assist. Slight B  knees buckling during ambulation. Needs assist for maintaining safe use of HW.   Stairs            Wheelchair Mobility    Modified Rankin (Stroke Patients Only)       Balance                                    Cognition Arousal/Alertness: Awake/alert Behavior During Therapy: WFL for tasks assessed/performed Overall Cognitive Status: Within Functional Limits for tasks assessed                      Exercises Other Exercises Other Exercises: supine ther-ex performed on B LE including SLRs, ankle pumps, hip abd/add, and SAQ. All ther-ex performed x 12 reps. Further ther-ex performed on R UE including grip squeezes and wrist circles. All ther-ex performed x 10 reps with cga.    General Comments        Pertinent Vitals/Pain Pain Assessment: Faces Faces Pain Scale: Hurts a little bit Pain Location: R shoulder Pain Descriptors / Indicators: Discomfort;Dull Pain Intervention(s): Limited activity within patient's tolerance    Home Living                      Prior Function            PT Goals (current goals can now be found in the care plan section) Acute Rehab PT Goals Patient Stated Goal: to get stronger PT Goal Formulation: With patient Time For Goal Achievement: 04/25/16 Potential  to Achieve Goals: Good Progress towards PT goals: Progressing toward goals    Frequency    7X/week      PT Plan Current plan remains appropriate    Co-evaluation             End of Session Equipment Utilized During Treatment: Gait belt Activity Tolerance: Patient limited by fatigue Patient left: in chair;with chair alarm set;with nursing/sitter in room     Time: 1131-1156 PT Time Calculation (min) (ACUTE ONLY): 25 min  Charges:  $Gait Training: 8-22 mins $Therapeutic Exercise: 8-22 mins                    G Codes:      Rosine Solecki Apr 17, 2016, 3:33 PM Greggory Stallion, PT, DPT [phone]

## 2016-04-12 NOTE — Progress Notes (Signed)
Report called to [place] at [facility]. Patient will be going with spouse.

## 2016-04-12 NOTE — Progress Notes (Signed)
Clinical Education officer, museum (CSW) presented bed offers to patient and she chose [facility]. Patient is medically stable for D/C to [facility] today. Per [facility] admissions coordinator at [facility] patient will go to room 201-B. RN will call report at [phone]. Patient's husband [name] will transport. CSW sent D/C orders to [facility] via [place]. Patient is aware of above. Please reconsult if future social work needs arise. CSW signing off.   [employer], LCSW [phone]

## 2016-04-12 NOTE — Discharge Instructions (Signed)
Follow-up with primary care physician at the facility in 2-3 days Follow-up with orthopedics in 2 weeks Continue physical therapy Continued right shoulder sling

## 2016-04-13 DIAGNOSIS — G20 Parkinson's disease: Secondary | ICD-10-CM | POA: Diagnosis not present

## 2016-04-13 DIAGNOSIS — M810 Age-related osteoporosis without current pathological fracture: Secondary | ICD-10-CM | POA: Diagnosis not present

## 2016-04-13 DIAGNOSIS — J449 Chronic obstructive pulmonary disease, unspecified: Secondary | ICD-10-CM | POA: Diagnosis not present

## 2016-04-17 ENCOUNTER — Non-Acute Institutional Stay (SKILLED_NURSING_FACILITY): Payer: Medicare Other | Admitting: Gerontology

## 2016-04-17 DIAGNOSIS — M5412 Radiculopathy, cervical region: Secondary | ICD-10-CM

## 2016-04-17 DIAGNOSIS — M80021D Age-related osteoporosis with current pathological fracture, right humerus, subsequent encounter for fracture with routine healing: Secondary | ICD-10-CM

## 2016-04-17 DIAGNOSIS — G8911 Acute pain due to trauma: Secondary | ICD-10-CM | POA: Diagnosis not present

## 2016-04-17 NOTE — Progress Notes (Signed)
Location:      Place of Service:  SNF (31) Provider:  Toni Arthurs, NP-C  Lelon Huh, MD  Patient Care Team: Birdie Sons, MD as PCP - General (Family Medicine) Vladimir Crofts, MD (Neurology)  Extended Emergency Contact Information Primary Emergency Contact: Dowland,Earnest W Address: [id number], [place] [address] [place] Phone: [phone] Relation: Spouse  Code Status:  full Goals of care: Advanced Directive information Advanced Directives 04/10/2016  Does Patient Have a Medical Advance Directive? No  Would patient like information on creating a medical advance directive? No - Patient declined     Chief Complaint  Patient presents with  . Acute Visit    HPI:  Pt is a 66 y.o. female seen today for an acute visit for pain control issues related to right humerus fracture. This is complicated by Chronic radiculopathy of the cervical region. Pt has chronic back pain and spasms as a result of this, worsening the acute pain from the fracture. Pt reports the spasms are worse after use with therapy.Pt has frequent falls at home and reports she typically has worse pain after a "bad fall". She reports her pain as a 10/10 at times. Tramadol only takes the edge off but doesn't get rid of the pain. Pt does not want to take the Hydrocodone. She has not been able to take her usual Soma d/t pharmacy has not delivered it yet. At home, she typically takes 1 whole Soma at HS and occasionally 1/2 tablet during the day d/t drowsiness. Pt also uses Capcasian patches to her back as well at times. Pt became tearful when taking about her chronic pain and the falls at home. She falls at home on average once every week or two. Says her husband helps to pick her up after a fall. Pt denies ever having loss of consciousness after a fall. Will continue to follow for pain control. Pt denies n/v/d/f/c/cp/sob/ha/abd pain/dizziness. VSS. No other complaints.     Results  for orders placed or performed during the hospital encounter of 04/10/16  MRSA PCR Screening  Result Value Ref Range   MRSA by PCR NEGATIVE NEGATIVE  CBC  Result Value Ref Range   WBC 6.7 3.6 - 11.0 K/uL   RBC 2.86 (L) 3.80 - 5.20 MIL/uL   Hemoglobin 10.5 (L) 12.0 - 16.0 g/dL   HCT 30.5 (L) 35.0 - 47.0 %   MCV 106.8 (H) 80.0 - 100.0 fL   MCH 36.9 (H) 26.0 - 34.0 pg   MCHC 34.5 32.0 - 36.0 g/dL   RDW 15.4 (H) 11.5 - 14.5 %   Platelets 178 150 - 440 K/uL  Basic metabolic panel  Result Value Ref Range   Sodium 139 135 - 145 mmol/L   Potassium 4.1 3.5 - 5.1 mmol/L   Chloride 105 101 - 111 mmol/L   CO2 29 22 - 32 mmol/L   Glucose, Bld 103 (H) 65 - 99 mg/dL   BUN 13 6 - 20 mg/dL   Creatinine, Ser 0.77 0.44 - 1.00 mg/dL   Calcium 8.2 (L) 8.9 - 10.3 mg/dL   GFR calc non Af Amer >60 >60 mL/min   GFR calc Af Amer >60 >60 mL/min   Anion gap 5 5 - 15  Basic metabolic panel  Result Value Ref Range   Sodium 135 135 - 145 mmol/L   Potassium 4.3 3.5 - 5.1 mmol/L   Chloride 104 101 -  111 mmol/L   CO2 26 22 - 32 mmol/L   Glucose, Bld 107 (H) 65 - 99 mg/dL   BUN 14 6 - 20 mg/dL   Creatinine, Ser 0.94 0.44 - 1.00 mg/dL   Calcium 8.0 (L) 8.9 - 10.3 mg/dL   GFR calc non Af Amer >60 >60 mL/min   GFR calc Af Amer >60 >60 mL/min   Anion gap 5 5 - 15  CBC  Result Value Ref Range   WBC 3.8 3.6 - 11.0 K/uL   RBC 2.40 (L) 3.80 - 5.20 MIL/uL   Hemoglobin 9.0 (L) 12.0 - 16.0 g/dL   HCT 25.7 (L) 35.0 - 47.0 %   MCV 107.2 (H) 80.0 - 100.0 fL   MCH 37.5 (H) 26.0 - 34.0 pg   MCHC 35.0 32.0 - 36.0 g/dL   RDW 15.0 (H) 11.5 - 14.5 %   Platelets 152 150 - 440 K/uL      Past Medical History:  Diagnosis Date  . Cerebrovascular disease   . Contact dermatitis   . COPD (chronic obstructive pulmonary disease) ([place])   . Depression   . Diverticulosis 10/31/10   On Tranverse colon  . GERD (gastroesophageal reflux disease)   . Myasthenia gravis ([place]) 02/2013   Diagnosed by doctor Manuella Ghazi  . Neuropathy  ([place])   . Osteoporosis   . Parkinson's disease (tremor, stiffness, slow motion, unstable posture) ([place])   . Stroke ([place])   . Vitamin D deficiency    Past Surgical History:  Procedure Laterality Date  . ABDOMINAL HYSTERECTOMY  2000   due to excessive bleeeding  . CERVICAL CONE BIOPSY    . Cervical discectomy and fusion  [facility]  . COLONOSCOPY WITH PROPOFOL N/A 12/06/2015   Procedure: COLONOSCOPY WITH PROPOFOL;  Surgeon: Manya Silvas, MD;  Location: [facility] ENDOSCOPY;  Service: Endoscopy;  Laterality: N/A;  . ESOPHAGOGASTRODUODENOSCOPY N/A 10/05/2014   Procedure: ESOPHAGOGASTRODUODENOSCOPY (EGD);  Surgeon: Manya Silvas, MD;  Location: [facility] ENDOSCOPY;  Service: Endoscopy;  Laterality: N/A;  . Repeat cuff surgery  2004   Developed adhesive capsulitis  . ROTATOR CUFF REPAIR Right 2003   Shoulder    Allergies  Allergen Reactions  . Effexor Xr [Venlafaxine Hcl Er] Other (See Comments)    Reaction:  GI upset   . Fosamax [Alendronate Sodium] Nausea And Vomiting  . Percocet [Oxycodone-Acetaminophen] Swelling  . Erythromycin Swelling and Rash    Pt states that her tongue swells.    Katrinka Blazing [Lidocaine] Swelling and Rash  . Morphine And Related Swelling, Rash and Other (See Comments)    Pt states that her tongue swells.    . Sulfa Antibiotics Swelling and Rash  . Tramadol Swelling and Rash    Allergies as of 04/17/2016      Reactions   Effexor Xr [venlafaxine Hcl Er] Other (See Comments)   Reaction:  GI upset    Fosamax [alendronate Sodium] Nausea And Vomiting   Percocet [oxycodone-acetaminophen] Swelling   Erythromycin Swelling, Rash   Pt states that her tongue swells.     Lidoderm [lidocaine] Swelling, Rash   Morphine And Related Swelling, Rash, Other (See Comments)   Pt states that her tongue swells.     Sulfa Antibiotics Swelling, Rash   Tramadol Swelling, Rash      Medication List       Accurate as of 04/17/16  8:47 PM. Always use your  most recent med list.  acetaminophen 325 MG tablet Commonly known as:  TYLENOL Take 2 tablets (650 mg total) by mouth every 6 (six) hours as needed for mild pain (or Fever >/= 101).   buPROPion 150 MG 12 hr tablet Commonly known as:  WELLBUTRIN SR Take 1 tablet (150 mg total) by mouth 2 (two) times daily.   carbidopa-levodopa 25-100 MG tablet Commonly known as:  SINEMET IR Take 1 tablet by mouth 2 (two) times daily.   carisoprodol 350 MG tablet Commonly known as:  SOMA Take 1 tablet (350 mg total) by mouth 2 (two) times daily as needed.   clonazePAM 0.5 MG tablet Commonly known as:  KLONOPIN TAKE 1 TABLET BY MOUTH AT BEDTIME AS NEEDED FOR ANXIETY.   gabapentin 300 MG capsule Commonly known as:  NEURONTIN Take 300 mg by mouth 2 (two) times daily.   HYDROcodone-acetaminophen 5-325 MG tablet Commonly known as:  NORCO/VICODIN Take 1-2 tablets by mouth every 4 (four) hours as needed for moderate pain.   ibuprofen 400 MG tablet Commonly known as:  ADVIL,MOTRIN TAKE 1 TABLET BY MOUTH  TWICE DAILY AS NEEDED FOR  BACK PAIN   LYRICA 75 MG capsule Generic drug:  pregabalin TAKE ONE CAPSULE BY MOUTH TWICE DAILY   omeprazole 40 MG capsule Commonly known as:  PRILOSEC Take 1 capsule (40 mg total) by mouth daily.   primidone 250 MG tablet Commonly known as:  MYSOLINE Take 250 mg by mouth 2 (two) times daily.   triamcinolone ointment 0.1 % Commonly known as:  KENALOG APPLY TO AFFECTED AREA TWICE A DAY AS NEEDED.       Review of Systems  Constitutional: Negative for activity change, appetite change, chills, diaphoresis and fever.  HENT: Negative for congestion, sneezing, sore throat, trouble swallowing and voice change.   Eyes: Negative for pain, redness and visual disturbance.  Respiratory: Negative for apnea, cough, choking, chest tightness, shortness of breath and wheezing.   Cardiovascular: Negative for chest pain, palpitations and leg swelling.    Gastrointestinal: Negative for abdominal distention, abdominal pain, constipation, diarrhea and nausea.  Genitourinary: Negative for difficulty urinating, dysuria, frequency and urgency.  Musculoskeletal: Positive for arthralgias (typical arthritis), back pain, joint swelling, neck pain and neck stiffness. Negative for gait problem and myalgias.  Skin: Negative for color change, pallor, rash and wound.  Neurological: Negative for dizziness, tremors, syncope, speech difficulty, weakness, numbness and headaches.  Psychiatric/Behavioral: Negative for agitation and behavioral problems.  All other systems reviewed and are negative.   Immunization History  Administered Date(s) Administered  . Influenza, High Dose Seasonal PF 12/28/2015  . Pneumococcal Polysaccharide-23 02/16/2011   Pertinent  Health Maintenance Due  Topic Date Due  . PAP SMEAR  11/10/1971  . MAMMOGRAM  10/06/2011  . PNA vac Low Risk Adult (1 of 2 - PCV13) 11/10/2015  . COLONOSCOPY  12/05/2025  . INFLUENZA VACCINE  Completed  . DEXA SCAN  Completed   No flowsheet data found. Functional Status Survey:    Vitals:   04/17/16 0430  BP: (!) 85/45  Pulse: 73  Resp: 20  Temp: 98 F (36.7 C)  SpO2: 98%  Weight: 102 lb 12.8 oz (46.6 kg)   Body mass index is 18.8 kg/m. Physical Exam  Constitutional: She is oriented to person, place, and time. Vital signs are normal. She appears well-developed and well-nourished. She is active and cooperative. She does not appear ill. No distress.  HENT:  Head: Normocephalic and atraumatic.  Mouth/Throat: Uvula is midline, oropharynx is clear and moist  and mucous membranes are normal. Mucous membranes are not pale, not dry and not cyanotic.  Eyes: Conjunctivae, EOM and lids are normal. Pupils are equal, round, and reactive to light.  Neck: Trachea normal, normal range of motion and full passive range of motion without pain. Neck supple. No JVD present. No tracheal deviation, no edema and  no erythema present. No thyromegaly present.  Cardiovascular: Normal rate, regular rhythm, normal heart sounds and intact distal pulses.  Exam reveals no gallop, no distant heart sounds, no friction rub and no decreased pulses.   No murmur heard. Pulses:      Radial pulses are 2+ on the right side, and 2+ on the left side.       Dorsalis pedis pulses are 1+ on the right side, and 1+ on the left side.  Pulmonary/Chest: Effort normal and breath sounds normal. No accessory muscle usage. No respiratory distress. She has no wheezes. She has no rales. She exhibits no tenderness.  Abdominal: Normal appearance and bowel sounds are normal. She exhibits no distension and no ascites. There is no tenderness.  Musculoskeletal:       Cervical back: She exhibits decreased range of motion, pain and spasm.       Right upper arm: She exhibits tenderness and edema.  Expected osteoarthritis, stiffness; B-calves soft, supple. Negative Homan's sign. Pulses equal, strong x 4  Neurological: She is alert and oriented to person, place, and time. She has normal strength.  Skin: Skin is warm, dry and intact. No rash noted. She is not diaphoretic. No cyanosis or erythema. No pallor. Nails show no clubbing.  Psychiatric: She has a normal mood and affect. Her speech is normal and behavior is normal. Judgment and thought content normal. Cognition and memory are normal.  Nursing note and vitals reviewed.   Labs reviewed:  Recent Labs  04/10/16 0523 04/11/16 0437  NA 139 135  K 4.1 4.3  CL 105 104  CO2 29 26  GLUCOSE 103* 107*  BUN 13 14  CREATININE 0.77 0.94  CALCIUM 8.2* 8.0*   No results for input(s): AST, ALT, ALKPHOS, BILITOT, PROT, ALBUMIN in the last 8760 hours.  Recent Labs  06/29/15 1142 04/10/16 0523 04/11/16 0437  WBC 3.4 6.7 3.8  HGB  --  10.5* 9.0*  HCT 32.0* 30.5* 25.7*  MCV 106* 106.8* 107.2*  PLT 222 178 152   Lab Results  Component Value Date   TSH 1.40 07/23/2014   No results found  for: HGBA1C Lab Results  Component Value Date   CHOL 138 05/14/2013   HDL 36 (L) 05/14/2013   LDLCALC 85 05/14/2013   TRIG 84 05/14/2013    Significant Diagnostic Results in last 30 days:  Dg Shoulder Right  Result Date: 04/10/2016 CLINICAL DATA:  Ground level fall tonight EXAM: RIGHT SHOULDER - 2+ VIEW COMPARISON:  None. FINDINGS: There is an acute fracture of the proximal right humerus with mild impaction and slight angulation. No dislocation. No radiographic findings to suggest a pathologic basis for the fracture. IMPRESSION: Mildly impacted transverse fracture of the proximal humerus. Electronically Signed   By: Andreas Newport M.D.   On: 04/10/2016 04:56   Dg Humerus Right  Result Date: 04/10/2016 CLINICAL DATA:  Ground level fall tonight EXAM: RIGHT HUMERUS - 2+ VIEW COMPARISON:  None. FINDINGS: There is a transverse fracture across the surgical neck of humerus with mild impaction. No other acute bony abnormality is evident. Remainder of the humerus is intact. IMPRESSION: Proximal right humeral fracture.  Electronically Signed   By: Andreas Newport M.D.   On: 04/10/2016 04:56   Dg Hip Unilat  With Pelvis 2-3 Views Right  Result Date: 04/10/2016 CLINICAL DATA:  Ground level fall tonight. EXAM: DG HIP (WITH OR WITHOUT PELVIS) 2-3V RIGHT COMPARISON:  None. FINDINGS: There is no evidence of hip fracture or dislocation. There is no evidence of arthropathy or other focal bone abnormality. IMPRESSION: Negative. Electronically Signed   By: Andreas Newport M.D.   On: 04/10/2016 04:55    Assessment/Plan 1. Osteoporosis with pathological fracture of upper arm, right, with routine healing, subsequent encounter  Right arm in sling  Hand, wrist and for-arm exercises as directed  Fall precautions  Safety precautions   2. Radiculopathy of cervical region  Capsacian 0.025% cram- thin film to neck and back TID for pain  Baclofen 5 mg po TID- spasms  3. Pain, acute due to  trauma  Continue Tramadol 50 mg 1-2 tablets po Q 4 hours prn pain  Continue complementary therapies including:  PT/OT  Restorative Nursing  Ice pack to site QID and prn  Diversional activities  Repositioning Q2 hours and prn  Scheduled Tylenol 650 mg po QID  Family/ staff Communication:   Total Time:  Documentation:  Face to Face:  Family/Phone:   Labs/tests ordered:    Medication list reviewed and assessed for continued appropriateness.  Vikki Ports, NP-C Geriatrics [facility] [phone], [place] [address] Cell Phone (Mon-Fri 8am-5pm):  [phone] On Call:  [phone] & follow prompts after 5pm & weekends Office Phone:  [phone] Office Fax:  [phone]

## 2016-04-18 DIAGNOSIS — M80021D Age-related osteoporosis with current pathological fracture, right humerus, subsequent encounter for fracture with routine healing: Secondary | ICD-10-CM | POA: Diagnosis not present

## 2016-04-18 LAB — CBC WITH DIFFERENTIAL/PLATELET
BASOS ABS: 0 10*3/uL (ref 0–0.1)
BASOS PCT: 1 %
Eosinophils Absolute: 0.3 10*3/uL (ref 0–0.7)
Eosinophils Relative: 8 %
HCT: 29 % — ABNORMAL LOW (ref 35.0–47.0)
Hemoglobin: 9.9 g/dL — ABNORMAL LOW (ref 12.0–16.0)
Lymphocytes Relative: 28 %
Lymphs Abs: 1.1 10*3/uL (ref 1.0–3.6)
MCH: 37 pg — ABNORMAL HIGH (ref 26.0–34.0)
MCHC: 34 g/dL (ref 32.0–36.0)
MCV: 108.9 fL — ABNORMAL HIGH (ref 80.0–100.0)
MONO ABS: 0.4 10*3/uL (ref 0.2–0.9)
MONOS PCT: 10 %
Neutro Abs: 2 10*3/uL (ref 1.4–6.5)
Neutrophils Relative %: 53 %
PLATELETS: 201 10*3/uL (ref 150–440)
RBC: 2.66 MIL/uL — ABNORMAL LOW (ref 3.80–5.20)
RDW: 14.9 % — ABNORMAL HIGH (ref 11.5–14.5)
WBC: 3.8 10*3/uL (ref 3.6–11.0)

## 2016-04-18 LAB — BASIC METABOLIC PANEL
ANION GAP: 6 (ref 5–15)
BUN: 18 mg/dL (ref 6–20)
CALCIUM: 8.6 mg/dL — AB (ref 8.9–10.3)
CO2: 27 mmol/L (ref 22–32)
CREATININE: 0.83 mg/dL (ref 0.44–1.00)
Chloride: 104 mmol/L (ref 101–111)
Glucose, Bld: 88 mg/dL (ref 65–99)
Potassium: 4.4 mmol/L (ref 3.5–5.1)
SODIUM: 137 mmol/L (ref 135–145)

## 2016-05-02 DIAGNOSIS — S42201D Unspecified fracture of upper end of right humerus, subsequent encounter for fracture with routine healing: Secondary | ICD-10-CM | POA: Diagnosis not present

## 2016-05-05 ENCOUNTER — Ambulatory Visit: Payer: Medicare Other | Admitting: Physical Therapy

## 2016-05-08 ENCOUNTER — Ambulatory Visit: Payer: Medicare Other | Attending: Internal Medicine | Admitting: Occupational Therapy

## 2016-05-08 ENCOUNTER — Encounter: Payer: Medicare Other | Admitting: Physical Therapy

## 2016-05-08 DIAGNOSIS — M25641 Stiffness of right hand, not elsewhere classified: Secondary | ICD-10-CM | POA: Insufficient documentation

## 2016-05-08 DIAGNOSIS — M25631 Stiffness of right wrist, not elsewhere classified: Secondary | ICD-10-CM

## 2016-05-08 DIAGNOSIS — M6281 Muscle weakness (generalized): Secondary | ICD-10-CM

## 2016-05-08 DIAGNOSIS — M79641 Pain in right hand: Secondary | ICD-10-CM | POA: Insufficient documentation

## 2016-05-08 NOTE — Patient Instructions (Signed)
Contrast  digits extention - gentle PROM and tapping  MC flexion  Intrinsic fist  Full fist to 5 cm and 3 cm foamblock  AROM 10 reps each   Opposition to all digits  isotoner glove for night time on R hand  HEP 2-3 x day

## 2016-05-08 NOTE — Therapy (Signed)
[facility] [address], [place], [address] Phone: [phone]   Fax:  [phone]  Occupational Therapy Evaluation  Patient Details  Name: Alexandria Rodriguez MRN: [id number] Date of Birth: [birth year] Referring Provider: Dr Mack Guise  Encounter Date: 05/08/2016      OT End of Session - 05/08/16 1242    Visit Number 1   Number of Visits 8   Date for OT Re-Evaluation 06/05/16   OT Start Time 1125   OT Stop Time 1224   OT Time Calculation (min) 59 min   Activity Tolerance Patient tolerated treatment well   Behavior During Therapy [facility] for tasks assessed/performed      Past Medical History:  Diagnosis Date  . Cerebrovascular disease   . Contact dermatitis   . COPD (chronic obstructive pulmonary disease) ([place])   . Depression   . Diverticulosis 10/31/10   On Tranverse colon  . GERD (gastroesophageal reflux disease)   . Myasthenia gravis ([place]) 02/2013   Diagnosed by doctor Manuella Ghazi  . Neuropathy ([place])   . Osteoporosis   . Parkinson's disease (tremor, stiffness, slow motion, unstable posture) ([place])   . Stroke ([place])   . Vitamin D deficiency     Past Surgical History:  Procedure Laterality Date  . ABDOMINAL HYSTERECTOMY  2000   due to excessive bleeeding  . CERVICAL CONE BIOPSY    . Cervical discectomy and fusion  [facility]  . COLONOSCOPY WITH PROPOFOL N/A 12/06/2015   Procedure: COLONOSCOPY WITH PROPOFOL;  Surgeon: Manya Silvas, MD;  Location: [facility] ENDOSCOPY;  Service: Endoscopy;  Laterality: N/A;  . ESOPHAGOGASTRODUODENOSCOPY N/A 10/05/2014   Procedure: ESOPHAGOGASTRODUODENOSCOPY (EGD);  Surgeon: Manya Silvas, MD;  Location: [facility] ENDOSCOPY;  Service: Endoscopy;  Laterality: N/A;  . Repeat cuff surgery  2004   Developed adhesive capsulitis  . ROTATOR CUFF REPAIR Right 2003   Shoulder    There were no vitals filed for this visit.      Subjective Assessment - 05/08/16  1237    Subjective  I had pain , swelling and stiffness since Aug last year after falling - xrays negative for fracture - I fell again on 1st Jan  - with distal humerus fracture - and was at rehab for 2 wks - MD refer me for hand pain and stiffness  that do not want to resolve -    Patient Stated Goals To get the use of my R hand back - get the pain and stiffness better -  to squeez or hold objects, do zips and buttons, cut food, pull tab or open bottles    Currently in Pain? No/denies           [facility] OT Assessment - 05/08/16 0001      Assessment   Diagnosis R hand stiffness and pain    Referring Provider Dr Mack Guise   Onset Date 11/09/15   Prior Therapy --  at [facility] rehab for 2wks earlier this month     Precautions   Precaution Comments R UE in sling - not therapy yet     Balance Screen   Has the patient fallen in the past 6 months Yes   How many times? 2   Has the patient had a decrease in activity level because of a fear of falling?  Yes   Is the patient reluctant to leave their home because of a fear of falling?  No  Home  Environment   Lives With Spouse     Prior Function   Vocation On disability   Leisure Do some light meals, andl light house work , play games and read on IPAD- L hand dominant      Edema   Edema digits increase .5 cm at PIP compare to L      AROM   Right Wrist Extension 60 Degrees   Right Wrist Flexion 60 Degrees   Left Wrist Extension 70 Degrees   Left Wrist Flexion 90 Degrees     Strength   Right Hand Grip (lbs) NT   Left Hand Grip (lbs) 25   Left Hand Lateral Pinch 9 lbs   Left Hand 3 Point Pinch 6 lbs     Right Hand AROM   R Thumb Opposition to Index --  to base of 5th    R Index  MCP 0-90 70 Degrees  -25   R Index PIP 0-100 71 Degrees  -30   R Long  MCP 0-90 70 Degrees  -30   R Long PIP 0-100 75 Degrees  -25   R Ring  MCP 0-90 66 Degrees  -25   R Ring PIP 0-100 80 Degrees  -30   R Little  MCP 0-90 66 Degrees  -25   R  Little PIP 0-100 80 Degrees  -10      Paraffin bath done for R hand - to decrease stiffness and pain  Reviewed HEP with pt - see pt instruction   ROM and pain decrease afterwards                     OT Education - 05/08/16 1241    Education provided Yes   Education Details Findings of eval and HEP   Person(s) Educated Patient   Methods Explanation;Demonstration;Tactile cues;Handout;Verbal cues   Comprehension Verbal cues required;Returned demonstration;Verbalized understanding          OT Short Term Goals - 05/08/16 1251      OT SHORT TERM GOAL #1   Title Pain  in R hand on PRWHE improve with at least 20 points    Baseline at eval PRWHE for pain 30/50   Time 3   Period Weeks   Status New     OT SHORT TERM GOAL #2   Title AROM for digits flexion improve for pt to be 1 cm out of palm to hold brush, turn doorkonb   Baseline Flexion at [facility] 66-70 and PIP's 71 to 80   Time 3   Period Weeks   Status New     OT SHORT TERM GOAL #3   Title Digits extention improve with 10 degrees to be able to donn glove with ease    Baseline Digits extention PIP's -30 to -10 and MC's -25 to -30    Time 3   Period Weeks   Status New           OT Long Term Goals - 05/08/16 1254      OT LONG TERM GOAL #1   Title R hand digits improve for pt to touch palm to do buttons/zips, cut food   Baseline see flowsheet    Time 4   Period Weeks   Status New     OT LONG TERM GOAL #2   Title R Wrist AROM improve to WNL to use hand with more ease in bathing/dressing and laundry    Baseline Wrist flexion and ext 60  Time 3   Period Weeks   Status New     OT LONG TERM GOAL #3   Title Function score on PRWHE improve with 10 points    Baseline Function at eval 20/50    Time 4   Period Weeks   Status New               Plan - 05/08/16 1243    Clinical Impression Statement Pt present at eval post distal humerus fx 4 wks ago - was in rehab - she had fall in Aug that  resulted in stiff and painfull R hand - no fracture - she did not pursue therapy because of the holidays- she do have diagnosis of Parkinsons - she  refer last week by ortho for R hand stiffness and pain -pt do report some sorenss and stiffness in L hand lately too - pt show increase pain and edema in R hand , severe stiffness  - limiiting her functional use of R hand  - Pt is L hand dominant    Rehab Potential Good   Clinical Impairments Affecting Rehab Potential stiffness since Aug 2017   OT Frequency 2x / week   OT Duration 4 weeks   OT Treatment/Interventions Self-care/ADL training;Fluidtherapy;Splinting;Patient/family education;Therapeutic exercises;Contrast Bath;Passive range of motion;Parrafin;Manual Therapy   Plan Assess progress with HEP   OT Home Exercise Plan see pt instruction   Consulted and Agree with Plan of Care Patient      Patient will benefit from skilled therapeutic intervention in order to improve the following deficits and impairments:  Decreased coordination, Decreased range of motion, Impaired flexibility, Increased edema, Pain, Impaired UE functional use, Decreased strength, Decreased knowledge of precautions  Visit Diagnosis: Stiffness of right hand, not elsewhere classified - Plan: Ot plan of care cert/re-cert  Stiffness of right wrist, not elsewhere classified - Plan: Ot plan of care cert/re-cert  Pain in right hand - Plan: Ot plan of care cert/re-cert  Muscle weakness (generalized) - Plan: Ot plan of care cert/re-cert      G-Codes - [id number]    Functional Assessment Tool Used ROM, pain, PRWHE , clinical judgement    Functional Limitation Self care   Self Care Current Status [id number]) At least 40 percent but less than 60 percent impaired, limited or restricted   Self Care Goal Status [id number]) At least 1 percent but less than 20 percent impaired, limited or restricted      Problem List Patient Active Problem List   Diagnosis Date Noted  . Humerus  fracture 04/10/2016  . AD (atopic dermatitis) 06/01/2015  . Back pain, chronic 06/01/2015  . Compression fracture 06/01/2015  . 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 Erb-Goldflam disease ([place]) 06/01/2015  . TMJ (temporomandibular joint disorder) 12/22/2014  . Diverticulosis 12/22/2014  . Depression 12/22/2014  . Cerebrovascular disease 12/22/2014  . GERD (gastroesophageal reflux disease) 12/22/2014  . Fibrocystic breast disease 12/22/2014  . Dyspnea 12/22/2014  . Tremor 12/22/2014  . Radiculopathy of cervical region 12/22/2014  . Tobacco use 12/22/2014  . Abnormal loss of weight 12/22/2014  . Bloodgood disease 12/22/2014  . Diverticulitis 12/22/2014  . History of adenomatous polyp of colon 12/04/2014  . OP (osteoporosis) 12/04/2014  . B12 deficiency 11/04/2014  . Acquired polyneuropathy ([place]) 11/03/2014  . Abnormal barium swallow 10/02/2014  . Dysphagia 10/02/2014  . Anxiety 07/31/2013  . LBP (low back pain) 07/31/2013  . Vitamin D deficiency 07/31/2013  . COPD (chronic obstructive pulmonary disease) ([place]) 12/27/2012    Rosalyn Gess OTR/L,CLT  05/08/2016, 1:01 PM  [facility] [address], [place], [address] Phone: [phone]   Fax:  [phone]  Name: Alexandria Rodriguez MRN: [id number] Date of Birth: 1950/06/12

## 2016-05-11 ENCOUNTER — Encounter: Payer: Medicare Other | Admitting: Physical Therapy

## 2016-05-12 ENCOUNTER — Ambulatory Visit: Payer: Medicare Other | Admitting: Occupational Therapy

## 2016-05-16 ENCOUNTER — Encounter: Payer: Medicare Other | Admitting: Physical Therapy

## 2016-05-18 ENCOUNTER — Encounter: Payer: Medicare Other | Admitting: Physical Therapy

## 2016-05-23 ENCOUNTER — Encounter: Payer: Medicare Other | Admitting: Physical Therapy

## 2016-05-23 DIAGNOSIS — S42201A Unspecified fracture of upper end of right humerus, initial encounter for closed fracture: Secondary | ICD-10-CM | POA: Diagnosis not present

## 2016-05-23 DIAGNOSIS — M25521 Pain in right elbow: Secondary | ICD-10-CM | POA: Diagnosis not present

## 2016-05-23 DIAGNOSIS — M25561 Pain in right knee: Secondary | ICD-10-CM | POA: Diagnosis not present

## 2016-05-25 ENCOUNTER — Encounter: Payer: Medicare Other | Admitting: Physical Therapy

## 2016-05-29 ENCOUNTER — Encounter: Payer: Medicare Other | Admitting: Physical Therapy

## 2016-06-01 ENCOUNTER — Encounter: Payer: Medicare Other | Admitting: Physical Therapy

## 2016-06-06 ENCOUNTER — Encounter: Payer: Medicare Other | Admitting: Physical Therapy

## 2016-06-08 ENCOUNTER — Encounter: Payer: Medicare Other | Admitting: Physical Therapy

## 2016-06-13 ENCOUNTER — Encounter: Payer: Medicare Other | Admitting: Physical Therapy

## 2016-06-15 ENCOUNTER — Encounter: Payer: Medicare Other | Admitting: Physical Therapy

## 2016-06-20 ENCOUNTER — Encounter: Payer: Medicare Other | Admitting: Physical Therapy

## 2016-06-22 ENCOUNTER — Encounter: Payer: Medicare Other | Admitting: Physical Therapy

## 2016-06-26 ENCOUNTER — Encounter: Payer: Medicare Other | Admitting: Physical Therapy

## 2016-06-29 ENCOUNTER — Encounter: Payer: Medicare Other | Admitting: Physical Therapy

## 2016-07-04 ENCOUNTER — Encounter: Payer: Medicare Other | Admitting: Physical Therapy

## 2016-07-05 ENCOUNTER — Ambulatory Visit: Payer: Medicare Other

## 2016-07-05 ENCOUNTER — Ambulatory Visit (INDEPENDENT_AMBULATORY_CARE_PROVIDER_SITE_OTHER): Payer: Medicare Other

## 2016-07-05 ENCOUNTER — Encounter: Payer: Self-pay | Admitting: Podiatry

## 2016-07-05 ENCOUNTER — Ambulatory Visit (INDEPENDENT_AMBULATORY_CARE_PROVIDER_SITE_OTHER): Payer: Medicare Other | Admitting: Podiatry

## 2016-07-05 VITALS — BP 117/74 | HR 77 | Resp 16

## 2016-07-05 DIAGNOSIS — G629 Polyneuropathy, unspecified: Secondary | ICD-10-CM | POA: Diagnosis not present

## 2016-07-05 DIAGNOSIS — M779 Enthesopathy, unspecified: Secondary | ICD-10-CM | POA: Diagnosis not present

## 2016-07-05 DIAGNOSIS — D369 Benign neoplasm, unspecified site: Secondary | ICD-10-CM | POA: Insufficient documentation

## 2016-07-05 NOTE — Progress Notes (Signed)
   Subjective:    Patient ID: Alexandria Rodriguez, female    DOB: October 30, 1950, 66 y.o.   MRN: [id number]  HPI: She presents today with chief complaint of painful arch and plantar forefoot pain pill and she she states that she feels like she has a pulling sensation in the arch and feels that she has rocks underneath her second metatarsal phalangeal joints bilateral. She states she can't wear regular shoes and socks she finds they're very uncomfortable. The neurologist as stated that she has neuropathy and has placed her on gabapentin and has an has increased with any changes in symptoms.    Review of Systems  Constitutional: Positive for appetite change, fatigue and unexpected weight change.  Eyes: Positive for visual disturbance.  Musculoskeletal: Positive for arthralgias, back pain, gait problem and myalgias.  Skin: Positive for rash.  Neurological: Positive for dizziness, weakness, light-headedness and numbness.  Hematological: Bruises/bleeds easily.  Psychiatric/Behavioral: Positive for confusion.  All other systems reviewed and are negative.      Objective:   Physical Exam: Vital signs are stable she is alert and oriented 3 pulses are palpable. Neurologic sensorium is decreased per Semmes-Weinstein monofilament. Muscle strength is normal bilateral. Hammertoe deformities are noted bilateral orthopedic evaluation otherwise all joints to the ankle for range of motion or crepitation. She has pain on end range of motion of the second metatarsophalangeal joints bilaterally. She has pain on palpation first and second interdigital spaces bilaterally. Radiographs taken today do not demonstrate any type of major osseous abnormalities or fractures no major malalignments other than hammertoe deformities.          Assessment & Plan:  Capsulitis second metatarsophalangeal joint with neuropathy bilateral.  Plan: Discussed etiology pathology conservative versus surgical therapies at this point inject the  bilateral foot today from the second metatarsal phalangeal joint with Kenalog and local anesthetic. Did not provide her with any oral medication however I did suggest that possibly next and we will start with a topical analgesic.

## 2016-07-06 ENCOUNTER — Encounter: Payer: Medicare Other | Admitting: Physical Therapy

## 2016-07-27 ENCOUNTER — Other Ambulatory Visit: Payer: Self-pay | Admitting: Family Medicine

## 2016-07-27 DIAGNOSIS — Z1231 Encounter for screening mammogram for malignant neoplasm of breast: Secondary | ICD-10-CM

## 2016-08-07 ENCOUNTER — Ambulatory Visit: Payer: Medicare Other | Admitting: Podiatry

## 2016-08-15 ENCOUNTER — Ambulatory Visit
Admission: RE | Admit: 2016-08-15 | Discharge: 2016-08-15 | Disposition: A | Discharge status: Home / Self Care | Payer: Medicare Other | Source: Ambulatory Visit | Attending: Family Medicine | Admitting: Family Medicine

## 2016-08-15 DIAGNOSIS — Z1231 Encounter for screening mammogram for malignant neoplasm of breast: Secondary | ICD-10-CM | POA: Diagnosis not present

## 2016-08-16 ENCOUNTER — Ambulatory Visit (INDEPENDENT_AMBULATORY_CARE_PROVIDER_SITE_OTHER): Payer: Medicare Other | Admitting: Podiatry

## 2016-08-16 ENCOUNTER — Encounter: Payer: Self-pay | Admitting: Podiatry

## 2016-08-16 DIAGNOSIS — G629 Polyneuropathy, unspecified: Secondary | ICD-10-CM

## 2016-08-16 DIAGNOSIS — M779 Enthesopathy, unspecified: Secondary | ICD-10-CM | POA: Diagnosis not present

## 2016-08-16 MED ORDER — GABAPENTIN 600 MG PO TABS: 600.0000 mg | ORAL_TABLET | Freq: Two times a day (BID) | ORAL | 1 refills | Status: DC

## 2016-08-16 NOTE — Progress Notes (Signed)
She presents today for follow-up of her capsulitis of the second metatarsophalangeal joint bilaterally and her neuropathy bilaterally. States that her feet are exactly the same they haven't changed at all. Shots did not help at all. She relates the only relief she gets is when she lays flat on the bed if she sits up or her feet are hanging down her feet and legs hurt. This is most likely associated with her back.  Objective: No change in physical exam.  Assessment: Probable radiculopathy associated with spinal stenosis back surgery and cervical stenosis.  Plan: At this point I feel that the only way to treat her to help alleviate any of her symptoms at this point we will not interfere with her Parkinson's medication would be to increase her gabapentin. I'm going increase her gabapentin from 300 mg twice a day to 600 mg twice a day. We will see if this mild increase will alleviate any of her symptoms at all. If this fails to make any difference whatsoever that I would recommend spinal cord stimulator if it is not contraindicated by Parkinson's disease.

## 2016-08-17 ENCOUNTER — Telehealth: Payer: Self-pay | Admitting: Podiatry

## 2016-08-17 MED ORDER — GABAPENTIN 300 MG PO CAPS
300.0000 mg | ORAL_CAPSULE | Freq: Two times a day (BID) | ORAL | 0 refills | Status: DC
Start: 2016-08-17 — End: 2016-10-10

## 2016-08-17 MED ORDER — GABAPENTIN 300 MG PO CAPS: 300.0000 mg | ORAL_CAPSULE | Freq: Three times a day (TID) | ORAL | 0 refills | Status: DC

## 2016-08-17 NOTE — Telephone Encounter (Signed)
Patient called said she saw Dr. Milinda Pointer yesterday and he was going to increase her gabapentin. She got her RX last night and he only called 600 mlg 2 times a day and that is what she was already on so he didn't increase the dosage. She wants the nurse to call her. She doesn't want to have to pay for another rx.

## 2016-08-17 NOTE — Addendum Note (Signed)
Addended by: Harriett Sine D on: 08/17/2016 09:56 AM   Modules accepted: Orders

## 2016-08-17 NOTE — Telephone Encounter (Addendum)
Pt states she has been taking 600mg  Gabapentin 2 times daily, and Dr. Milinda Pointer was going to increase to 900mg  Gabapentin 2 times daily, but only ordered 600mg  2 times daily. I spoke with Dr. Milinda Pointer and he states pt had told him she was taking 300mg  Gabapentin 2 times daily, so he had increase to 600mg , but pt can increase to 900mg  2 times daily. I informed pt of Dr. Stephenie Acres statement and she said she may have stated wrong, but would like to increase to 900mg  2 times day. Pt states has #15 Gabapentin 300mg  left. I told her I would order #165 Gabapentin 300mg  to take 2 times a day, and she could complete the #15 Gabapentin she has with the 600mg  Gabapentin 2 times a day and pick up the refill of the #165. I told pt at her next refill of the Gabapentin 300mg  I would order #180. Pt states she would like the Gabapentin 300mg  #165 sent to [facility].

## 2016-08-21 ENCOUNTER — Telehealth: Payer: Self-pay | Admitting: Podiatry

## 2016-08-21 NOTE — Telephone Encounter (Signed)
[facility] called stating on 08/17/16 they received 2 different rx's for this patient, same rx, but two different directions for usage.  They would like a call back. [phone]- reference # [id number].

## 2016-08-21 NOTE — Telephone Encounter (Signed)
[facility] pharmacist states has 2 rx for Gabapentin 300mg  which is correct. I told her Gabapentin 300mg  #165 one capsule bid.

## 2016-10-10 ENCOUNTER — Other Ambulatory Visit: Payer: Self-pay | Admitting: *Deleted

## 2016-10-10 MED ORDER — GABAPENTIN 300 MG PO CAPS: 300.0000 mg | ORAL_CAPSULE | Freq: Two times a day (BID) | ORAL | 0 refills | Status: DC

## 2016-10-10 NOTE — Progress Notes (Signed)
Authorized 1 refill advised appt needed for additional refils

## 2016-10-16 ENCOUNTER — Other Ambulatory Visit: Payer: Self-pay | Admitting: Family Medicine

## 2016-10-17 NOTE — Telephone Encounter (Signed)
Done. Prescription called into pharmacy.  

## 2016-10-17 NOTE — Telephone Encounter (Signed)
Please call in [place]

## 2016-10-25 ENCOUNTER — Ambulatory Visit (INDEPENDENT_AMBULATORY_CARE_PROVIDER_SITE_OTHER): Payer: Medicare Other | Admitting: Podiatry

## 2016-10-25 ENCOUNTER — Encounter: Payer: Self-pay | Admitting: Podiatry

## 2016-10-25 DIAGNOSIS — G629 Polyneuropathy, unspecified: Secondary | ICD-10-CM | POA: Diagnosis not present

## 2016-10-25 NOTE — Progress Notes (Signed)
She presents today for follow-up of the radiculopathy she states that this really is not any better at all since my feet are just killing me she states that the gabapentin increase or we doubled gabapentin 600 mg 3 times a day was not helpful at all. She states that after standing on her feet for more than 5 minutes she's ready to sit down and start crying. She has a history of Parkinson's disease and is not sure of the Parkinson's is suspecting this worse or not.  Objective: Vital signs are stable she is alert and oriented 3 no change from last evaluation.  Assessment: Idiopathic neuropathy neuropathy possibly associated with Parkinson's disease.  Plan: I will refer her to Dr. Maryjean Ka for consideration of pain management possible implanted spinal stimulator.

## 2016-10-26 NOTE — Addendum Note (Signed)
Addended by: Graceann Congress D on: 10/26/2016 10:27 AM   Modules accepted: Orders

## 2016-10-29 ENCOUNTER — Other Ambulatory Visit: Payer: Self-pay | Admitting: Family Medicine

## 2016-10-30 ENCOUNTER — Telehealth: Payer: Self-pay | Admitting: Podiatry

## 2016-10-30 DIAGNOSIS — E538 Deficiency of other specified B group vitamins: Secondary | ICD-10-CM | POA: Diagnosis not present

## 2016-10-30 DIAGNOSIS — G629 Polyneuropathy, unspecified: Secondary | ICD-10-CM | POA: Diagnosis not present

## 2016-10-30 DIAGNOSIS — R413 Other amnesia: Secondary | ICD-10-CM | POA: Diagnosis not present

## 2016-10-30 DIAGNOSIS — G20 Parkinson's disease: Secondary | ICD-10-CM | POA: Diagnosis not present

## 2016-10-30 DIAGNOSIS — R251 Tremor, unspecified: Secondary | ICD-10-CM | POA: Diagnosis not present

## 2016-10-30 NOTE — Telephone Encounter (Signed)
Rx called in to pharmacy. 

## 2016-10-30 NOTE — Telephone Encounter (Signed)
Please call in Soma 

## 2016-10-30 NOTE — Telephone Encounter (Signed)
Patient needs conformation on Neuro referral. Please call patient at [phone]

## 2016-10-31 NOTE — Telephone Encounter (Signed)
Patient is scheduled with Dr. Maryjean Ka on 12/20/16 at 2pm.

## 2016-12-20 DIAGNOSIS — G20 Parkinson's disease: Secondary | ICD-10-CM | POA: Diagnosis not present

## 2016-12-20 DIAGNOSIS — G629 Polyneuropathy, unspecified: Secondary | ICD-10-CM | POA: Diagnosis not present

## 2016-12-21 DIAGNOSIS — G629 Polyneuropathy, unspecified: Secondary | ICD-10-CM | POA: Diagnosis not present

## 2016-12-21 DIAGNOSIS — E538 Deficiency of other specified B group vitamins: Secondary | ICD-10-CM | POA: Diagnosis not present

## 2016-12-21 DIAGNOSIS — G250 Essential tremor: Secondary | ICD-10-CM | POA: Diagnosis not present

## 2016-12-21 DIAGNOSIS — G43109 Migraine with aura, not intractable, without status migrainosus: Secondary | ICD-10-CM | POA: Diagnosis not present

## 2016-12-21 DIAGNOSIS — G20 Parkinson's disease: Secondary | ICD-10-CM | POA: Diagnosis not present

## 2017-01-01 ENCOUNTER — Other Ambulatory Visit: Payer: Self-pay | Admitting: Family Medicine

## 2017-01-02 ENCOUNTER — Ambulatory Visit: Payer: Medicare Other

## 2017-01-03 ENCOUNTER — Other Ambulatory Visit: Payer: Self-pay | Admitting: *Deleted

## 2017-01-03 ENCOUNTER — Other Ambulatory Visit: Payer: Self-pay

## 2017-01-03 MED ORDER — GABAPENTIN 300 MG PO CAPS: 300.0000 mg | ORAL_CAPSULE | Freq: Two times a day (BID) | ORAL | 0 refills | Status: DC

## 2017-01-03 NOTE — Telephone Encounter (Signed)
Refilled Gabapentin 300mg  1 BID to [facility] pharmacy

## 2017-01-10 ENCOUNTER — Ambulatory Visit (INDEPENDENT_AMBULATORY_CARE_PROVIDER_SITE_OTHER): Payer: Medicare Other

## 2017-01-10 VITALS — BP 142/78 | HR 82 | Temp 99.0°F | Resp 16 | Ht 62.0 in | Wt 120.2 lb

## 2017-01-10 DIAGNOSIS — Z1159 Encounter for screening for other viral diseases: Secondary | ICD-10-CM | POA: Diagnosis not present

## 2017-01-10 DIAGNOSIS — Z0000 Encounter for general adult medical examination without abnormal findings: Secondary | ICD-10-CM

## 2017-01-10 DIAGNOSIS — Z23 Encounter for immunization: Secondary | ICD-10-CM | POA: Diagnosis not present

## 2017-01-10 NOTE — Patient Instructions (Addendum)
Alexandria Rodriguez , Thank you for taking time to come for your Medicare Wellness Visit. I appreciate your ongoing commitment to your health goals. Please review the following plan we discussed and let me know if I can assist you in the future.   Screening recommendations/referrals: Colonoscopy: completed Mammogram: completed Bone Density: completed Recommended yearly ophthalmology/optometry visit for glaucoma screening and checkup Recommended yearly dental visit for hygiene and checkup  Vaccinations: Influenza vaccine: given today Pneumococcal vaccine: completed series today Tdap vaccine: declined. Please check with insurance regarding coverage. Shingles vaccine: declined. Please check with insurance regarding coverage.    Advanced directives: Advance directive discussed with you today. I have provided a copy for you to complete at home and have notarized. Once this is complete please bring a copy in to our office so we can scan it into your chart.  Conditions/risks identified: Recommend eliminating smoking habits; Fall risk prevention discussed  Next appointment: Follow up annual wellness exam in one year  Pneumococcal Conjugate Vaccine (PCV13) What You Need to Know 1. Why get vaccinated? Vaccination can protect both children and adults from pneumococcal disease. Pneumococcal disease is caused by bacteria that can spread from person to person through close contact. It can cause ear infections, and it can also lead to more serious infections of the:  Lungs (pneumonia),  Blood (bacteremia), and  Covering of the brain and spinal cord (meningitis).  Pneumococcal pneumonia is most common among adults. Pneumococcal meningitis can cause deafness and brain damage, and it kills about 1 child in 10 who get it. Anyone can get pneumococcal disease, but children under 13 years of age and adults 39 years and older, people with certain medical conditions, and cigarette smokers are at the highest  risk. Before there was a vaccine, the [place] saw:  more than 700 cases of meningitis,  about 13,000 blood infections,  about 5 million ear infections, and  about 200 deaths  in children under 5 each year from pneumococcal disease. Since vaccine became available, severe pneumococcal disease in these children has fallen by 88%. About 18,000 older adults die of pneumococcal disease each year in the [place]. Treatment of pneumococcal infections with penicillin and other drugs is not as effective as it used to be, because some strains of the disease have become resistant to these drugs. This makes prevention of the disease, through vaccination, even more important. 2. PCV13 vaccine Pneumococcal conjugate vaccine (called PCV13) protects against 13 types of pneumococcal bacteria. PCV13 is routinely given to children at 2, 4, 6, and 56-37 months of age. It is also recommended for children and adults 95 to 32 years of age with certain health conditions, and for all adults 65 years of age and older. Your doctor can give you details. 3. Some people should not get this vaccine Anyone who has ever had a life-threatening allergic reaction to a dose of this vaccine, to an earlier pneumococcal vaccine called PCV7, or to any vaccine containing diphtheria toxoid (for example, DTaP), should not get PCV13. Anyone with a severe allergy to any component of PCV13 should not get the vaccine. Tell your doctor if the person being vaccinated has any severe allergies. If the person scheduled for vaccination is not feeling well, your healthcare provider might decide to reschedule the shot on another day. 4. Risks of a vaccine reaction With any medicine, including vaccines, there is a chance of reactions. These are usually mild and go away on their own, but serious reactions are also possible. Problems  reported following PCV13 varied by age and dose in the series. The most common problems reported among  children were:  About half became drowsy after the shot, had a temporary loss of appetite, or had redness or tenderness where the shot was given.  About 1 out of 3 had swelling where the shot was given.  About 1 out of 3 had a mild fever, and about 1 in 20 had a fever over 102.58F.  Up to about 8 out of 10 became fussy or irritable.  Adults have reported pain, redness, and swelling where the shot was given; also mild fever, fatigue, headache, chills, or muscle pain. Young children who get PCV13 along with inactivated flu vaccine at the same time may be at increased risk for seizures caused by fever. Ask your doctor for more information. Problems that could happen after any vaccine:  People sometimes faint after a medical procedure, including vaccination. Sitting or lying down for about 15 minutes can help prevent fainting, and injuries caused by a fall. Tell your doctor if you feel dizzy, or have vision changes or ringing in the ears.  Some older children and adults get severe pain in the shoulder and have difficulty moving the arm where a shot was given. This happens very rarely.  Any medication can cause a severe allergic reaction. Such reactions from a vaccine are very rare, estimated at about 1 in a million doses, and would happen within a few minutes to a few hours after the vaccination. As with any medicine, there is a very small chance of a vaccine causing a serious injury or death. The safety of vaccines is always being monitored. For more information, visit: http://www.aguilar.org/ 5. What if there is a serious reaction? What should I look for? Look for anything that concerns you, such as signs of a severe allergic reaction, very high fever, or unusual behavior. Signs of a severe allergic reaction can include hives, swelling of the face and throat, difficulty breathing, a fast heartbeat, dizziness, and weakness-usually within a few minutes to a few hours after the  vaccination. What should I do?  If you think it is a severe allergic reaction or other emergency that can't wait, call 9-1-1 or get the person to the nearest hospital. Otherwise, call your doctor.  Reactions should be reported to the Vaccine Adverse Event Reporting System (VAERS). Your doctor should file this report, or you can do it yourself through the VAERS web site at www.vaers.SamedayNews.es, or by calling 806-480-9042. ? VAERS does not give medical advice. 6. The National Vaccine Injury Compensation Program The [employer] Vaccine Injury Compensation Program (VICP) is a federal program that was created to compensate people who may have been injured by certain vaccines. Persons who believe they may have been injured by a vaccine can learn about the program and about filing a claim by calling 682-222-8281 or visiting the [place] website at GoldCloset.com.ee. There is a time limit to file a claim for compensation. 7. How can I learn more?  Ask your healthcare provider. He or she can give you the vaccine package insert or suggest other sources of information.  Call your local or state health department.  Contact the Centers for Disease Control and Prevention (CDC): ? Call (431)003-0073 (1-800-CDC-INFO) or ? Visit CDC's website at http://hunter.com/ Vaccine Information Statement, PCV13 Vaccine (02/12/2014) This information is not intended to replace advice given to you by your health care provider. Make sure you discuss any questions you have with your health care provider.  Document Released: 01/22/2006 Document Revised: 12/16/2015 Document Reviewed: 12/16/2015 Elsevier Interactive Patient Education  2017 [place]. Influenza (Flu) Vaccine (Inactivated or Recombinant): What You Need to Know 1. Why get vaccinated? Influenza ("flu") is a contagious disease that spreads around the [place] every year, usually between October and May. Flu is caused by influenza viruses, and  is spread mainly by coughing, sneezing, and close contact. Anyone can get flu. Flu strikes suddenly and can last several days. Symptoms vary by age, but can include:  fever/chills  sore throat  muscle aches  fatigue  cough  headache  runny or stuffy nose  Flu can also lead to pneumonia and blood infections, and cause diarrhea and seizures in children. If you have a medical condition, such as heart or lung disease, flu can make it worse. Flu is more dangerous for some people. Infants and young children, people 26 years of age and older, pregnant women, and people with certain health conditions or a weakened immune system are at greatest risk. Each year thousands of people in the [place] die from flu, and many more are hospitalized. Flu vaccine can:  keep you from getting flu,  make flu less severe if you do get it, and  keep you from spreading flu to your family and other people. 2. Inactivated and recombinant flu vaccines A dose of flu vaccine is recommended every flu season. Children 6 months through 96 years of age may need two doses during the same flu season. Everyone else needs only one dose each flu season. Some inactivated flu vaccines contain a very small amount of a mercury-based preservative called thimerosal. Studies have not shown thimerosal in vaccines to be harmful, but flu vaccines that do not contain thimerosal are available. There is no live flu virus in flu shots. They cannot cause the flu. There are many flu viruses, and they are always changing. Each year a new flu vaccine is made to protect against three or four viruses that are likely to cause disease in the upcoming flu season. But even when the vaccine doesn't exactly match these viruses, it may still provide some protection. Flu vaccine cannot prevent:  flu that is caused by a virus not covered by the vaccine, or  illnesses that look like flu but are not.  It takes about 2 weeks for protection to  develop after vaccination, and protection lasts through the flu season. 3. Some people should not get this vaccine Tell the person who is giving you the vaccine:  If you have any severe, life-threatening allergies. If you ever had a life-threatening allergic reaction after a dose of flu vaccine, or have a severe allergy to any part of this vaccine, you may be advised not to get vaccinated. Most, but not all, types of flu vaccine contain a small amount of egg protein.  If you ever had Guillain-Barr Syndrome (also called GBS). Some people with a history of GBS should not get this vaccine. This should be discussed with your doctor.  If you are not feeling well. It is usually okay to get flu vaccine when you have a mild illness, but you might be asked to come back when you feel better.  4. Risks of a vaccine reaction With any medicine, including vaccines, there is a chance of reactions. These are usually mild and go away on their own, but serious reactions are also possible. Most people who get a flu shot do not have any problems with it. Minor problems following  a flu shot include:  soreness, redness, or swelling where the shot was given  hoarseness  sore, red or itchy eyes  cough  fever  aches  headache  itching  fatigue  If these problems occur, they usually begin soon after the shot and last 1 or 2 days. More serious problems following a flu shot can include the following:  There may be a small increased risk of Guillain-Barre Syndrome (GBS) after inactivated flu vaccine. This risk has been estimated at 1 or 2 additional cases per million people vaccinated. This is much lower than the risk of severe complications from flu, which can be prevented by flu vaccine.  Young children who get the flu shot along with pneumococcal vaccine (PCV13) and/or DTaP vaccine at the same time might be slightly more likely to have a seizure caused by fever. Ask your doctor for more information.  Tell your doctor if a child who is getting flu vaccine has ever had a seizure.  Problems that could happen after any injected vaccine:  People sometimes faint after a medical procedure, including vaccination. Sitting or lying down for about 15 minutes can help prevent fainting, and injuries caused by a fall. Tell your doctor if you feel dizzy, or have vision changes or ringing in the ears.  Some people get severe pain in the shoulder and have difficulty moving the arm where a shot was given. This happens very rarely.  Any medication can cause a severe allergic reaction. Such reactions from a vaccine are very rare, estimated at about 1 in a million doses, and would happen within a few minutes to a few hours after the vaccination. As with any medicine, there is a very remote chance of a vaccine causing a serious injury or death. The safety of vaccines is always being monitored. For more information, visit: http://www.aguilar.org/ 5. What if there is a serious reaction? What should I look for? Look for anything that concerns you, such as signs of a severe allergic reaction, very high fever, or unusual behavior. Signs of a severe allergic reaction can include hives, swelling of the face and throat, difficulty breathing, a fast heartbeat, dizziness, and weakness. These would start a few minutes to a few hours after the vaccination. What should I do?  If you think it is a severe allergic reaction or other emergency that can't wait, call 9-1-1 and get the person to the nearest hospital. Otherwise, call your doctor.  Reactions should be reported to the Vaccine Adverse Event Reporting System (VAERS). Your doctor should file this report, or you can do it yourself through the VAERS web site at www.vaers.SamedayNews.es, or by calling 620-078-9060. ? VAERS does not give medical advice. 6. The National Vaccine Injury Compensation Program The [employer] Vaccine Injury Compensation Program (VICP) is a federal  program that was created to compensate people who may have been injured by certain vaccines. Persons who believe they may have been injured by a vaccine can learn about the program and about filing a claim by calling (507) 792-8881 or visiting the [employer] website at GoldCloset.com.ee. There is a time limit to file a claim for compensation. 7. How can I learn more?  Ask your healthcare provider. He or she can give you the vaccine package insert or suggest other sources of information.  Call your local or state health department.  Contact the Centers for Disease Control and Prevention (CDC): ? Call (580)079-0090 (1-800-CDC-INFO) or ? Visit CDC's website at https://gibson.com/ Vaccine Information Statement, Inactivated Influenza Vaccine (11/14/2013)  This information is not intended to replace advice given to you by your health care provider. Make sure you discuss any questions you have with your health care provider. Document Released: 01/19/2006 Document Revised: 12/16/2015 Document Reviewed: 12/16/2015 Elsevier Interactive Patient Education  2017 [place] 65 Years and Older, Female Preventive care refers to lifestyle choices and visits with your health care provider that can promote health and wellness. What does preventive care include?  A yearly physical exam. This is also called an annual well check.  Dental exams once or twice a year.  Routine eye exams. Ask your health care provider how often you should have your eyes checked.  Personal lifestyle choices, including:  Daily care of your teeth and gums.  Regular physical activity.  Eating a healthy diet.  Avoiding tobacco and drug use.  Limiting alcohol use.  Practicing safe sex.  Taking low-dose aspirin every day.  Taking vitamin and mineral supplements as recommended by your health care provider. What happens during an annual well check? The services and screenings done by your  health care provider during your annual well check will depend on your age, overall health, lifestyle risk factors, and family history of disease. Counseling  Your health care provider may ask you questions about your:  Alcohol use.  Tobacco use.  Drug use.  Emotional well-being.  Home and relationship well-being.  Sexual activity.  Eating habits.  History of falls.  Memory and ability to understand (cognition).  Work and work Statistician.  Reproductive health. Screening  You may have the following tests or measurements:  Height, weight, and BMI.  Blood pressure.  Lipid and cholesterol levels. These may be checked every 5 years, or more frequently if you are over 58 years old.  Skin check.  Lung cancer screening. You may have this screening every year starting at age 68 if you have a 30-pack-year history of smoking and currently smoke or have quit within the past 15 years.  Fecal occult blood test (FOBT) of the stool. You may have this test every year starting at age 21.  Flexible sigmoidoscopy or colonoscopy. You may have a sigmoidoscopy every 5 years or a colonoscopy every 10 years starting at age 39.  Hepatitis C blood test.  Hepatitis B blood test.  Sexually transmitted disease (STD) testing.  Diabetes screening. This is done by checking your blood sugar (glucose) after you have not eaten for a while (fasting). You may have this done every 1-3 years.  Bone density scan. This is done to screen for osteoporosis. You may have this done starting at age 58.  Mammogram. This may be done every 1-2 years. Talk to your health care provider about how often you should have regular mammograms. Talk with your health care provider about your test results, treatment options, and if necessary, the need for more tests. Vaccines  Your health care provider may recommend certain vaccines, such as:  Influenza vaccine. This is recommended every year.  Tetanus, diphtheria, and  acellular pertussis (Tdap, Td) vaccine. You may need a Td booster every 10 years.  Zoster vaccine. You may need this after age 31.  Pneumococcal 13-valent conjugate (PCV13) vaccine. One dose is recommended after age 74.  Pneumococcal polysaccharide (PPSV23) vaccine. One dose is recommended after age 66. Talk to your health care provider about which screenings and vaccines you need and how often you need them. This information is not intended to replace advice given to you by your health  care provider. Make sure you discuss any questions you have with your health care provider. Document Released: 04/23/2015 Document Revised: 12/15/2015 Document Reviewed: 01/26/2015 Elsevier Interactive Patient Education  2017 [place] Prevention in the Home Falls can cause injuries. They can happen to people of all ages. There are many things you can do to make your home safe and to help prevent falls. What can I do on the outside of my home?  Regularly fix the edges of walkways and driveways and fix any cracks.  Remove anything that might make you trip as you walk through a door, such as a raised step or threshold.  Trim any bushes or trees on the path to your home.  Use bright outdoor lighting.  Clear any walking paths of anything that might make someone trip, such as rocks or tools.  Regularly check to see if handrails are loose or broken. Make sure that both sides of any steps have handrails.  Any raised decks and porches should have guardrails on the edges.  Have any leaves, snow, or ice cleared regularly.  Use sand or salt on walking paths during winter.  Clean up any spills in your garage right away. This includes oil or grease spills. What can I do in the bathroom?  Use night lights.  Install grab bars by the toilet and in the tub and shower. Do not use towel bars as grab bars.  Use non-skid mats or decals in the tub or shower.  If you need to sit down in the shower, use  a plastic, non-slip stool.  Keep the floor dry. Clean up any water that spills on the floor as soon as it happens.  Remove soap buildup in the tub or shower regularly.  Attach bath mats securely with double-sided non-slip rug tape.  Do not have throw rugs and other things on the floor that can make you trip. What can I do in the bedroom?  Use night lights.  Make sure that you have a light by your bed that is easy to reach.  Do not use any sheets or blankets that are too big for your bed. They should not hang down onto the floor.  Have a firm chair that has side arms. You can use this for support while you get dressed.  Do not have throw rugs and other things on the floor that can make you trip. What can I do in the kitchen?  Clean up any spills right away.  Avoid walking on wet floors.  Keep items that you use a lot in easy-to-reach places.  If you need to reach something above you, use a strong step stool that has a grab bar.  Keep electrical cords out of the way.  Do not use floor polish or wax that makes floors slippery. If you must use wax, use non-skid floor wax.  Do not have throw rugs and other things on the floor that can make you trip. What can I do with my stairs?  Do not leave any items on the stairs.  Make sure that there are handrails on both sides of the stairs and use them. Fix handrails that are broken or loose. Make sure that handrails are as long as the stairways.  Check any carpeting to make sure that it is firmly attached to the stairs. Fix any carpet that is loose or worn.  Avoid having throw rugs at the top or bottom of the stairs. If you do have throw  rugs, attach them to the floor with carpet tape.  Make sure that you have a light switch at the top of the stairs and the bottom of the stairs. If you do not have them, ask someone to add them for you. What else can I do to help prevent falls?  Wear shoes that:  Do not have high heels.  Have  rubber bottoms.  Are comfortable and fit you well.  Are closed at the toe. Do not wear sandals.  If you use a stepladder:  Make sure that it is fully opened. Do not climb a closed stepladder.  Make sure that both sides of the stepladder are locked into place.  Ask someone to hold it for you, if possible.  Clearly mark and make sure that you can see:  Any grab bars or handrails.  First and last steps.  Where the edge of each step is.  Use tools that help you move around (mobility aids) if they are needed. These include:  Canes.  Walkers.  Scooters.  Crutches.  Turn on the lights when you go into a dark area. Replace any light bulbs as soon as they burn out.  Set up your furniture so you have a clear path. Avoid moving your furniture around.  If any of your floors are uneven, fix them.  If there are any pets around you, be aware of where they are.  Review your medicines with your doctor. Some medicines can make you feel dizzy. This can increase your chance of falling. Ask your doctor what other things that you can do to help prevent falls. This information is not intended to replace advice given to you by your health care provider. Make sure you discuss any questions you have with your health care provider. Document Released: 01/21/2009 Document Revised: 09/02/2015 Document Reviewed: 05/01/2014 Elsevier Interactive Patient Education  2017 [employer].

## 2017-01-10 NOTE — Progress Notes (Signed)
Subjective:   Alexandria Rodriguez is a 66 y.o. female who presents for an Initial Medicare Annual Wellness Visit.  Review of Systems    N/A  Cardiac Risk Factors include: advanced age (>98men, >63 women);sedentary lifestyle;smoking/ tobacco exposure     Objective:    Today's Vitals   01/10/17 1335 01/10/17 1342  BP: (!) 142/78   Pulse: 82   Resp: 16   Temp: 99 F (37.2 C)   Weight: 120 lb 3.2 oz (54.5 kg)   Height: 5\' 2"  (1.575 m)   PainSc:  8    Body mass index is 21.98 kg/m.   Current Medications (verified) Outpatient Encounter Prescriptions as of 01/10/2017  Medication Sig  . buPROPion (WELLBUTRIN SR) 150 MG 12 hr tablet Take 1 tablet (150 mg total) by mouth 2 (two) times daily.  . carbidopa-levodopa (SINEMET IR) 25-100 MG per tablet Take 1 tablet by mouth 2 (two) times daily.   . carisoprodol (SOMA) 350 MG tablet TAKE 1 TABLET BY MOUTH TWICE DAILY AS NEEDED  . gabapentin (NEURONTIN) 300 MG capsule Take 1 capsule (300 mg total) by mouth 2 (two) times daily.  Marland Kitchen gabapentin (NEURONTIN) 600 MG tablet Take 1 tablet (600 mg total) by mouth 2 (two) times daily.  Marland Kitchen LYRICA 75 MG capsule TAKE ONE CAPSULE BY MOUTH TWICE DAILY  . omeprazole (PRILOSEC) 40 MG capsule Take 1 capsule (40 mg total) by mouth daily.  . primidone (MYSOLINE) 250 MG tablet Take 250 mg by mouth 2 (two) times daily.   Marland Kitchen acetaminophen (TYLENOL) 325 MG tablet Take 2 tablets (650 mg total) by mouth every 6 (six) hours as needed for mild pain (or Fever >/= 101). (Patient not taking: Reported on 01/10/2017)  . IBU 400 MG tablet TAKE 1 TABLET BY MOUTH  TWICE DAILY AS NEEDED FOR  BACK PAIN (Patient not taking: Reported on 01/10/2017)  . [DISCONTINUED] clonazePAM (KLONOPIN) 0.5 MG tablet TAKE 1 TABLET BY MOUTH AT BEDTIME AS NEEDED FOR ANXIETY.  . [DISCONTINUED] meloxicam (MOBIC) 7.5 MG tablet TK 1 T PO BID WITH MEALS   No facility-administered encounter medications on file as of 01/10/2017.     Allergies  (verified) Effexor xr [venlafaxine hcl er]; Fosamax [alendronate sodium]; Percocet [oxycodone-acetaminophen]; Erythromycin; Lidoderm [lidocaine]; Morphine and related; Sulfa antibiotics; Sulfasalazine; and Tramadol   History: Past Medical History:  Diagnosis Date  . Cerebrovascular disease   . Contact dermatitis   . COPD (chronic obstructive pulmonary disease) ([place])   . Depression   . Diverticulosis 10/31/10   On Tranverse colon  . GERD (gastroesophageal reflux disease)   . Myasthenia gravis ([place]) 02/2013   Diagnosed by doctor Manuella Ghazi  . Neuropathy   . Osteoporosis   . Parkinson's disease (tremor, stiffness, slow motion, unstable posture) ([place])   . Stroke ([place])   . Vitamin D deficiency    Past Surgical History:  Procedure Laterality Date  . ABDOMINAL HYSTERECTOMY  2000   due to excessive bleeeding  . CERVICAL CONE BIOPSY    . Cervical discectomy and fusion  [facility]  . COLONOSCOPY WITH PROPOFOL N/A 12/06/2015   Procedure: COLONOSCOPY WITH PROPOFOL;  Surgeon: Manya Silvas, MD;  Location: [facility] ENDOSCOPY;  Service: Endoscopy;  Laterality: N/A;  . ESOPHAGOGASTRODUODENOSCOPY N/A 10/05/2014   Procedure: ESOPHAGOGASTRODUODENOSCOPY (EGD);  Surgeon: Manya Silvas, MD;  Location: [facility] ENDOSCOPY;  Service: Endoscopy;  Laterality: N/A;  . Repeat cuff surgery  2004   Developed adhesive capsulitis  . ROTATOR CUFF REPAIR  Right 2003   Shoulder   Family History  Problem Relation Age of Onset  . Colon cancer Mother   . Stroke Father   . Cancer Sister        Ovarian, stable  . Breast cancer Maternal Grandmother    Social History   Occupational History  . Not on file.   Social History Main Topics  . Smoking status: Current Every Day Smoker    Packs/day: 1.00    Types: Cigarettes  . Smokeless tobacco: Never Used     Comment:  Has been smoking for 30+ years, Cigarettes per day;10.  . Alcohol use No  . Drug use: No  . Sexual activity: Not on file     Tobacco Counseling Ready to quit: No Counseling given: Yes   Activities of Daily Living In your present state of health, do you have any difficulty performing the following activities: 01/10/2017 04/10/2016  Hearing? N N  Vision? N N  Difficulty concentrating or making decisions? Y N  Comment looses interest which leads to inability to concentrate -  Walking or climbing stairs? Y N  Comment requires cane and rail or assistance from husband to climb stairs.  -  Dressing or bathing? N N  Doing errands, shopping? Y N  Comment husband transports to appts or to run errands -  Conservation officer, nature and eating ? N -  Using the Toilet? N -  In the past six months, have you accidently leaked urine? Y -  Do you have problems with loss of bowel control? Y -  Managing your Medications? N -  Managing your Finances? N -  Housekeeping or managing your Housekeeping? N -  Some recent data might be hidden    Immunizations and Health Maintenance Immunization History  Administered Date(s) Administered  . Influenza Split 02/16/2011  . Influenza, High Dose Seasonal PF 12/28/2015, 01/10/2017  . Influenza,inj,Quad PF,6+ Mos 12/27/2012, 12/31/2013  . Pneumococcal Conjugate-13 01/10/2017  . Pneumococcal Polysaccharide-23 02/16/2011   There are no preventive care reminders to display for this patient.  Patient Care Team: Birdie Sons, MD as PCP - General (Family Medicine) Vladimir Crofts, MD (Neurology) Clydell Hakim, MD as Consulting Physician (Anesthesiology)  Indicate any recent Medical Services you may have received from other than [facility] providers in the past year (date may be approximate).     Assessment:   This is a routine wellness examination for Alexandria Rodriguez.   Hearing/Vision screen Vision Screening Comments: Has not had an eye exam in 6+ years. Recommended to schedule an appt as soon as she is able. Also recommended to call [facility] to establish care  Dietary issues and exercise  activities discussed: Current Exercise Habits: The patient does not participate in regular exercise at present, Exercise limited by: neurologic condition(s);orthopedic condition(s) (neuropathy in feet and essential tremors)  Goals    . Quit smoking / using tobacco          Recommend eliminating smoking habits.      Depression Screen PHQ 2/9 Scores 01/10/2017  PHQ - 2 Score 0    Fall Risk Fall Risk  01/10/2017  Falls in the past year? Yes  Number falls in past yr: 2 or more  Injury with Fall? Yes  Risk for fall due to : Impaired balance/gait;Impaired vision;Medication side effect  Risk for fall due to: Comment neuropathy in feet and essential tremors; wears eyeglasses  Follow up Education provided;Falls prevention discussed    Cognitive Function:  6CIT Screen 01/10/2017  What Year? 0 points  What month? 0 points  What time? 0 points  Count back from 20 0 points  Months in reverse 0 points  Repeat phrase 2 points  Total Score 2    Screening Tests Health Maintenance  Topic Date Due  . TETANUS/TDAP  01/09/2018 (Originally 11/09/1969)  . PNA vac Low Risk Adult (2 of 2 - PPSV23) 01/10/2018  . MAMMOGRAM  08/16/2018  . COLONOSCOPY  12/05/2025  . INFLUENZA VACCINE  Completed  . DEXA SCAN  Completed  . Hepatitis C Screening  Completed      Plan:   I have personally reviewed and addressed the Medicare Annual Wellness questionnaire and have noted the following in the patient's chart:  A. Medical and social history B. Use of alcohol, tobacco or illicit drugs  C. Current medications and supplements D. Functional ability and status E.  Nutritional status F.  Physical activity G. Advance directives H. List of other physicians I.  Hospitalizations, surgeries, and ER visits in previous 12 months J.  [place] such as hearing and vision if needed, cognitive and depression L. Referrals and appointments - none  In addition, I have reviewed and discussed with  patient certain preventive protocols, quality metrics, and best practice recommendations. A written personalized care plan for preventive services as well as general preventive health recommendations were provided to patient.  See attached scanned questionnaire for additional information.   Signed,  Aleatha Borer, LPN Nurse Health Advisor   MD Recommendations:

## 2017-01-12 ENCOUNTER — Ambulatory Visit
Admission: RE | Admit: 2017-01-12 | Discharge: 2017-01-12 | Disposition: A | Discharge status: Home / Self Care | Payer: Medicare Other | Source: Ambulatory Visit | Attending: Family Medicine | Admitting: Family Medicine

## 2017-01-12 ENCOUNTER — Ambulatory Visit (INDEPENDENT_AMBULATORY_CARE_PROVIDER_SITE_OTHER): Payer: Medicare Other | Admitting: Family Medicine

## 2017-01-12 ENCOUNTER — Encounter: Payer: Self-pay | Admitting: Family Medicine

## 2017-01-12 VITALS — BP 106/54 | HR 80 | Temp 98.4°F | Resp 16 | Wt 120.4 lb

## 2017-01-12 DIAGNOSIS — M7989 Other specified soft tissue disorders: Secondary | ICD-10-CM | POA: Diagnosis not present

## 2017-01-12 DIAGNOSIS — M79642 Pain in left hand: Secondary | ICD-10-CM

## 2017-01-12 DIAGNOSIS — M79641 Pain in right hand: Secondary | ICD-10-CM

## 2017-01-12 MED ORDER — NAPROXEN 500 MG PO TABS: 500.0000 mg | ORAL_TABLET | Freq: Two times a day (BID) | ORAL | 0 refills | Status: DC

## 2017-01-12 NOTE — Progress Notes (Signed)
Patient ID: Alexandria Rodriguez, female   DOB: September 19, 1950, 66 y.o.   MRN: [id number]        Patient: Alexandria Rodriguez Female    DOB: [birth year]   66 y.o.   MRN: [id number] Visit Date: 01/12/2017  Today's Provider: Lelon Huh, MD   Chief Complaint  Patient presents with  . Joint Pain   Subjective:    HPI   Patient presents today with complaints of joint pain in her hands for the past 6 months. Patient reports that she has had pain in her right thumb only and through out her entire left hand. Patient reports difficulty gripping and closing of hand.   Allergies  Allergen Reactions  . Effexor Xr [Venlafaxine Hcl Er] Other (See Comments)    Reaction:  GI upset   . Fosamax [Alendronate Sodium] Nausea And Vomiting  . Percocet [Oxycodone-Acetaminophen] Swelling  . Erythromycin Swelling and Rash    Pt states that her tongue swells.    Katrinka Blazing [Lidocaine] Swelling and Rash  . Morphine And Related Swelling, Rash and Other (See Comments)    Pt states that her tongue swells.   Pt states that her tongue swells.    . Sulfa Antibiotics Swelling and Rash    TONGUE SWELLING  . Sulfasalazine Rash and Swelling  . Tramadol Swelling and Rash     Current Outpatient Prescriptions:  .  acetaminophen (TYLENOL) 325 MG tablet, Take 2 tablets (650 mg total) by mouth every 6 (six) hours as needed for mild pain (or Fever >/= 101)., Disp: , Rfl:  .  buPROPion (WELLBUTRIN SR) 150 MG 12 hr tablet, Take 1 tablet (150 mg total) by mouth 2 (two) times daily., Disp: 180 tablet, Rfl: 3 .  carbidopa-levodopa (SINEMET IR) 25-100 MG per tablet, Take 1 tablet by mouth 2 (two) times daily. , Disp: , Rfl:  .  carisoprodol (SOMA) 350 MG tablet, TAKE 1 TABLET BY MOUTH TWICE DAILY AS NEEDED, Disp: 60 tablet, Rfl: 3 .  gabapentin (NEURONTIN) 300 MG capsule, Take 1 capsule (300 mg total) by mouth 2 (two) times daily., Disp: 165 capsule, Rfl: 0 .  gabapentin (NEURONTIN) 600 MG tablet, Take 1 tablet (600 mg total) by mouth 2  (two) times daily., Disp: 180 tablet, Rfl: 1 .  IBU 400 MG tablet, TAKE 1 TABLET BY MOUTH  TWICE DAILY AS NEEDED FOR  BACK PAIN, Disp: 180 tablet, Rfl: 4 .  LYRICA 75 MG capsule, TAKE ONE CAPSULE BY MOUTH TWICE DAILY, Disp: 60 capsule, Rfl: 3 .  omeprazole (PRILOSEC) 40 MG capsule, Take 1 capsule (40 mg total) by mouth daily., Disp: 90 capsule, Rfl: 4 .  primidone (MYSOLINE) 250 MG tablet, Take 250 mg by mouth 2 (two) times daily. , Disp: , Rfl:   Review of Systems  Constitutional: Negative.   HENT: Negative.   Eyes: Negative.   Respiratory: Negative.   Cardiovascular: Negative.   Gastrointestinal: Negative.   Endocrine: Negative.   Genitourinary: Negative.   Musculoskeletal: Positive for arthralgias, back pain, joint swelling, myalgias, neck pain and neck stiffness.  Skin: Negative.   Allergic/Immunologic: Negative.   Neurological: Negative.   Hematological: Does not bruise/bleed easily.  Psychiatric/Behavioral: Negative.     Social History  Substance Use Topics  . Smoking status: Current Every Day Smoker    Packs/day: 1.00    Types: Cigarettes  . Smokeless tobacco: Never Used     Comment:  Has been smoking for 30+ years, Cigarettes per day;10.  . Alcohol use No  Objective:   BP (!) 106/54   Pulse 80   Temp 98.4 F (36.9 C) (Oral)   Resp 16   Wt 120 lb 6.4 oz (54.6 kg)   SpO2 98%   BMI 22.02 kg/m  Vitals:   01/12/17 1533  BP: (!) 106/54  Pulse: 80  Resp: 16  Temp: 98.4 F (36.9 C)  TempSrc: Oral  SpO2: 98%  Weight: 120 lb 6.4 oz (54.6 kg)     Physical Exam  General appearance: alert, well developed, well nourished, cooperative and in no distress Head: Normocephalic, without obvious abnormality, atraumatic Respiratory: Respirations even and unlabored, normal respiratory rate Extremities: tender and swollen around both first MCPS and both second MCPs and PIPs. No erythema. Not hot to touch.      Assessment & Plan:     1. Bilateral hand pain Likely OA.  Consider topical NSAIDs. Check Xrays.  - DG Hand Complete Left; Future - DG Hand Complete Right; Future - naproxen (NAPROSYN) 500 MG tablet; Take 1 tablet (500 mg total) by mouth 2 (two) times daily with a meal.  Dispense: 30 tablet; Refill: 0       Lelon Huh, MD  [facility]

## 2017-01-15 ENCOUNTER — Telehealth: Payer: Self-pay | Admitting: Family Medicine

## 2017-01-15 ENCOUNTER — Telehealth: Payer: Self-pay | Admitting: *Deleted

## 2017-01-15 ENCOUNTER — Encounter: Payer: Self-pay | Admitting: Family Medicine

## 2017-01-15 DIAGNOSIS — E538 Deficiency of other specified B group vitamins: Secondary | ICD-10-CM

## 2017-01-15 MED ORDER — DICLOFENAC SODIUM 1 % TD GEL: TRANSDERMAL | 0 refills | Status: DC

## 2017-01-15 NOTE — Telephone Encounter (Signed)
Patient was notified of results. Rx was sent to pharmacy. 

## 2017-01-15 NOTE — Telephone Encounter (Signed)
Please advise 

## 2017-01-15 NOTE — Telephone Encounter (Signed)
-----   Message from Birdie Sons, MD sent at 01/15/2017  1:52 PM EDT ----- xrays of hands are normal. Pain and swelling are due to arthritis. Recommend she try diclofenac topical 1% gel, apply to affected area QID, 100 grams.

## 2017-01-15 NOTE — Telephone Encounter (Signed)
Pt is calling back regarding her xray results of her hand.  I did advise her it appears there will be an order put in for the B12 check but would wait until the nurse called to confirm.

## 2017-01-15 NOTE — Telephone Encounter (Signed)
Lab ordered.

## 2017-01-15 NOTE — Telephone Encounter (Signed)
Pt is wanting to have her B12 levels checked.States [facility] nurse came to her house and did a check and gave her a lab slip for Hep C check.  Pt states she would like to have all the labs done at one time if possible.  She would like to get a call back when lab order is ready.    States she is always waiting on a call back regarding her xray on her hand.

## 2017-01-15 NOTE — Telephone Encounter (Signed)
OK to order vitamin 12 for vitamin b12 deficiency

## 2017-01-17 NOTE — Addendum Note (Signed)
Addended by: Birdie Sons on: 01/17/2017 08:59 AM   Modules accepted: Level of Service

## 2017-02-01 DIAGNOSIS — G629 Polyneuropathy, unspecified: Secondary | ICD-10-CM | POA: Diagnosis not present

## 2017-02-01 DIAGNOSIS — G20 Parkinson's disease: Secondary | ICD-10-CM | POA: Diagnosis not present

## 2017-02-08 ENCOUNTER — Other Ambulatory Visit: Payer: Self-pay | Admitting: Family Medicine

## 2017-02-26 ENCOUNTER — Other Ambulatory Visit: Payer: Self-pay | Admitting: Family Medicine

## 2017-02-26 NOTE — Telephone Encounter (Signed)
[facility] faxed refill request for following medications: carisoprodol (SOMA) 350 MG tablet    Please advise,Thanks Powell

## 2017-02-27 ENCOUNTER — Telehealth: Payer: Self-pay | Admitting: *Deleted

## 2017-02-27 MED ORDER — CARISOPRODOL 350 MG PO TABS: 350.0000 mg | ORAL_TABLET | Freq: Two times a day (BID) | ORAL | 3 refills | Status: DC | PRN

## 2017-02-27 NOTE — Telephone Encounter (Addendum)
Refill request for gabapentin faxed to [facility] office for evaluation by Dr. Milinda Pointer.

## 2017-03-03 ENCOUNTER — Other Ambulatory Visit: Payer: Self-pay | Admitting: Family Medicine

## 2017-03-06 ENCOUNTER — Telehealth: Payer: Self-pay | Admitting: Podiatry

## 2017-03-06 NOTE — Telephone Encounter (Signed)
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 Calling to follow up on fax sent on 21 November in regards to reference# [id number]. We have an Rx request for gabapentin 600 mg. Please call us back at [phone] or you may fax the response to [phone]. Please use the reference# [id number] and please call us back within 24 hours. Thank you.

## 2017-03-15 MED ORDER — GABAPENTIN 600 MG PO TABS: 600.0000 mg | ORAL_TABLET | Freq: Two times a day (BID) | ORAL | 0 refills | Status: DC

## 2017-03-15 NOTE — Addendum Note (Signed)
Addended by: Graceann Congress D on: 03/15/2017 11:58 AM   Modules accepted: Orders

## 2017-03-15 NOTE — Telephone Encounter (Signed)
Pharmacy request 90 day supply refill on Gabapentin 600mg .  Per Dr Milinda Pointer, ok to refill.  Pharmacist notified via phone for refilled

## 2017-04-04 ENCOUNTER — Other Ambulatory Visit: Payer: Self-pay | Admitting: Podiatry

## 2017-04-09 NOTE — Telephone Encounter (Signed)
Dr. Milinda Pointer discontinued Gabapentin 300mg  bid 08/16/2016 and increased to 600mg  bid.

## 2017-05-15 ENCOUNTER — Encounter: Payer: Self-pay | Admitting: Family Medicine

## 2017-05-15 ENCOUNTER — Ambulatory Visit (INDEPENDENT_AMBULATORY_CARE_PROVIDER_SITE_OTHER): Payer: Medicare Other | Admitting: Family Medicine

## 2017-05-15 VITALS — BP 90/60 | HR 78 | Temp 98.9°F | Resp 18 | Wt 126.0 lb

## 2017-05-15 DIAGNOSIS — R05 Cough: Secondary | ICD-10-CM | POA: Diagnosis not present

## 2017-05-15 DIAGNOSIS — R059 Cough, unspecified: Secondary | ICD-10-CM

## 2017-05-15 DIAGNOSIS — J449 Chronic obstructive pulmonary disease, unspecified: Secondary | ICD-10-CM | POA: Diagnosis not present

## 2017-05-15 MED ORDER — PREDNISONE 20 MG PO TABS: 20.0000 mg | ORAL_TABLET | Freq: Two times a day (BID) | ORAL | 0 refills | Status: AC

## 2017-05-15 MED ORDER — DOXYCYCLINE HYCLATE 100 MG PO TABS: 100.0000 mg | ORAL_TABLET | Freq: Two times a day (BID) | ORAL | 0 refills | Status: AC

## 2017-05-15 NOTE — Progress Notes (Addendum)
Patient: Alexandria Rodriguez Female    DOB: 06/21/1950   67 y.o.   MRN: [id number] Visit Date: 05/15/2017  Today's Provider: Lelon Huh, MD   Chief Complaint  Patient presents with  . Cough   Subjective:    Cough  This is a new problem. Episode onset: 1 week ago. The problem has been gradually worsening. The cough is non-productive. Associated symptoms include headaches and shortness of breath. Pertinent negatives include no chest pain, chills, ear congestion, ear pain, fever, hemoptysis, myalgias, nasal congestion, postnasal drip, rhinorrhea, sore throat, sweats or wheezing. She has tried nothing for the symptoms.  States that cough is triggered by constant tickle in her throat. This morning patient had an episode where she had trouble breathing. She reports it felt like she "couldn't get air into her chest so that she could breath."  Had a second episodes within a few hours.      Allergies  Allergen Reactions  . Effexor Xr [Venlafaxine Hcl Er] Other (See Comments)    Reaction:  GI upset   . Fosamax [Alendronate Sodium] Nausea And Vomiting  . Percocet [Oxycodone-Acetaminophen] Swelling  . Erythromycin Swelling and Rash    Pt states that her tongue swells.    Katrinka Blazing [Lidocaine] Swelling and Rash  . Morphine And Related Swelling, Rash and Other (See Comments)    Pt states that her tongue swells.   Pt states that her tongue swells.    . Sulfa Antibiotics Swelling and Rash    TONGUE SWELLING  . Sulfasalazine Rash and Swelling  . Tramadol Swelling and Rash     Current Outpatient Medications:  .  acetaminophen (TYLENOL) 325 MG tablet, Take 2 tablets (650 mg total) by mouth every 6 (six) hours as needed for mild pain (or Fever >/= 101)., Disp: , Rfl:  .  buPROPion (WELLBUTRIN SR) 150 MG 12 hr tablet, TAKE 1 TABLET BY MOUTH TWO  TIMES DAILY, Disp: 180 tablet, Rfl: 3 .  carbidopa-levodopa (SINEMET IR) 25-100 MG per tablet, Take 1 tablet by mouth 2 (two) times daily. , Disp: ,  Rfl:  .  carisoprodol (SOMA) 350 MG tablet, Take 1 tablet (350 mg total) 2 (two) times daily as needed by mouth., Disp: 60 tablet, Rfl: 3 .  diclofenac sodium (VOLTAREN) 1 % GEL, Apply to affected area 4 times daily, Disp: 100 g, Rfl: 0 .  gabapentin (NEURONTIN) 300 MG capsule, Take 1 capsule (300 mg total) by mouth 2 (two) times daily., Disp: 165 capsule, Rfl: 0 .  gabapentin (NEURONTIN) 600 MG tablet, TAKE 1 TABLET BY MOUTH  TWICE A DAY, Disp: 180 tablet, Rfl: 0 .  IBU 400 MG tablet, TAKE 1 TABLET BY MOUTH  TWICE DAILY AS NEEDED FOR  BACK PAIN, Disp: 180 tablet, Rfl: 4 .  LYRICA 75 MG capsule, TAKE ONE CAPSULE BY MOUTH TWICE DAILY, Disp: 60 capsule, Rfl: 3 .  naproxen (NAPROSYN) 500 MG tablet, Take 1 tablet (500 mg total) by mouth 2 (two) times daily with a meal., Disp: 30 tablet, Rfl: 0 .  omeprazole (PRILOSEC) 40 MG capsule, TAKE 1 CAPSULE BY MOUTH  DAILY, Disp: 90 capsule, Rfl: 4 .  primidone (MYSOLINE) 250 MG tablet, Take 250 mg by mouth 2 (two) times daily. , Disp: , Rfl:   Review of Systems  Constitutional: Negative for chills and fever.  HENT: Negative for ear pain, postnasal drip, rhinorrhea and sore throat.   Respiratory: Positive for cough and shortness of breath. Negative  for hemoptysis and wheezing.        Dyspnea  Cardiovascular: Negative for chest pain.  Musculoskeletal: Negative for myalgias.  Neurological: Positive for light-headedness and headaches.    Social History   Tobacco Use  . Smoking status: Current Every Day Smoker    Packs/day: 1.00    Types: Cigarettes  . Smokeless tobacco: Never Used  . Tobacco comment:  Has been smoking for 30+ years, Cigarettes per day;10.  Substance Use Topics  . Alcohol use: No    Alcohol/week: 0.0 oz   Objective:   BP 90/60 (BP Location: Right Arm, Patient Position: Sitting, Cuff Size: Normal)   Pulse 78   Temp 98.9 F (37.2 C) (Oral)   Resp 18   Wt 126 lb (57.2 kg)   SpO2 97% Comment: room air  BMI 23.05 kg/m  Vitals:    05/15/17 1507  Weight: 126 lb (57.2 kg)     Physical Exam  General Appearance:    Alert, cooperative, no distress  HENT:   bilateral TM normal without fluid or infection, neck without nodes, throat normal without erythema or exudate, post nasal drip noted and nasal mucosa congested  Eyes:    PERRL, conjunctiva/corneas clear, EOM's intact       Lungs:     Occasional expiratory wheeze, no rales,, respirations unlabored, diminished lung sounds  Heart:    Regular rate and rhythm  Neurologic:   Awake, alert, oriented x 3. No apparent focal neurological           defect.           Assessment & Plan:     1. Cough  - doxycycline (VIBRA-TABS) 100 MG tablet; Take 1 tablet (100 mg total) by mouth 2 (two) times daily for 7 days.  Dispense: 14 tablet; Refill: 0  2. Chronic obstructive pulmonary disease, unspecified COPD type ([place])  - predniSONE (DELTASONE) 20 MG tablet; Take 1 tablet (20 mg total) by mouth 2 (two) times daily with a meal for 7 days.  Dispense: 14 tablet; Refill: 0  Call if symptoms change or if not rapidly improving.        The entirety of the information documented in the History of Present Illness, Review of Systems and Physical Exam were personally obtained by me. Portions of this information were initially documented by Meyer Cory, CMA and reviewed by me for thoroughness and accuracy.    Lelon Huh, MD  [facility]

## 2017-06-18 ENCOUNTER — Other Ambulatory Visit: Payer: Self-pay | Admitting: Podiatry

## 2017-06-20 DIAGNOSIS — G250 Essential tremor: Secondary | ICD-10-CM | POA: Diagnosis not present

## 2017-06-20 DIAGNOSIS — G629 Polyneuropathy, unspecified: Secondary | ICD-10-CM | POA: Diagnosis not present

## 2017-06-20 DIAGNOSIS — G20 Parkinson's disease: Secondary | ICD-10-CM | POA: Diagnosis not present

## 2017-06-25 DIAGNOSIS — G250 Essential tremor: Secondary | ICD-10-CM | POA: Insufficient documentation

## 2017-07-03 ENCOUNTER — Ambulatory Visit: Payer: Medicare Other | Admitting: Physical Therapy

## 2017-07-10 DIAGNOSIS — G629 Polyneuropathy, unspecified: Secondary | ICD-10-CM | POA: Diagnosis not present

## 2017-07-10 DIAGNOSIS — M5416 Radiculopathy, lumbar region: Secondary | ICD-10-CM | POA: Diagnosis not present

## 2017-07-10 DIAGNOSIS — M79671 Pain in right foot: Secondary | ICD-10-CM | POA: Diagnosis not present

## 2017-07-10 DIAGNOSIS — M79672 Pain in left foot: Secondary | ICD-10-CM | POA: Diagnosis not present

## 2017-07-17 ENCOUNTER — Other Ambulatory Visit: Payer: Self-pay | Admitting: Family Medicine

## 2017-07-19 ENCOUNTER — Other Ambulatory Visit: Payer: Self-pay | Admitting: Podiatry

## 2017-08-07 ENCOUNTER — Ambulatory Visit: Payer: Medicare Other | Admitting: Neurology

## 2017-08-07 ENCOUNTER — Encounter: Payer: Self-pay | Admitting: Neurology

## 2017-08-07 VITALS — BP 105/63 | HR 75 | Ht 62.0 in | Wt 124.0 lb

## 2017-08-07 DIAGNOSIS — R2689 Other abnormalities of gait and mobility: Secondary | ICD-10-CM | POA: Diagnosis not present

## 2017-08-07 DIAGNOSIS — G250 Essential tremor: Secondary | ICD-10-CM

## 2017-08-07 NOTE — Patient Instructions (Signed)
I do not see any signs or symptoms of parkinson's like disease or what we call parkinsonism. I would recommend that Dr. Manuella Ghazi taper you off of the Parkinson's medication, Sinemet.  You do have a tremor affecting both hands and head. For this, I would not recommend any new medication for fear of side effects or medication interaction, as you are already on high dose Mysoline. You may be a candidate for DBS. Please discuss this again with Dr. Manuella Ghazi, for evaluation for deep brain stimulation (DBS) for treatment of advanced essential tremor; there are several places you can go for this.   I do want to suggest a few things today:  Please limit your caffeine intake to one serving per day. Remember to drink plenty of fluid at least 6 glasses (8 oz each), eat healthy meals and do not skip any meals. Try to eat protein with a every meal and eat a healthy snack such as fruit or nuts in between meals. Try to keep a regular sleep-wake schedule and try to exercise daily, particularly in the form of walking, 20-30 minutes a day, if you can.   For gait safety, I would recommend that you start using a walker rather than a cane.   I would like for you to make a follow with Dr. Manuella Ghazi to discuss the above issues.

## 2017-08-07 NOTE — Progress Notes (Signed)
Subjective:    Patient ID: Alexandria Rodriguez is a 67 y.o. female.  HPI      Star Age, MD, PhD [facility] [address], [address] [place],  [address]  Dear Dr. Manuella Ghazi,   I saw your patient, Alexandria Rodriguez, upon your kind request in my neurologic clinic today for second opinion on her tremor disorder. The patient is accompanied by her husband today. As you know, Alexandria Rodriguez is a 67 year old left-handed woman with an underlying medical history of COPD, prior stroke, vitamin D deficiency, osteoporosis, neuropathy, reflux disease, diverticulosis, and depression, who reports a long-standing history of bilateral upper extremity tremors and head tremors for at least 9 years, could be longer ago. She noticed the tremor first in the L hand, shortly thereafter in the head area. She does not have a family history of tremor. Mother lived to be in the late 68s, died from cancer, father lived to be into the late 62s. She has an older brother and a younger sister, neither one with tremor. One son, age 24, no tremor.  Of note, she is on multiple medications including Sinemet 1 pill twice daily, Mysoline 250 mg twice daily, gabapentin, and Lyrica.  She had a brain MRI with and without contrast on 01/19/2014 and I reviewed the results: IMPRESSION:  Normal examination. No evidence of previous stroke. No imaging  finding of Parkinson's disease at this time.     She has been on Sinemet for about 2 years. She has been on the Mysoline for about a year. She feels that medication has helped some.  She does not drink alcohol, drinks coffee about 16 oz per day. Some sweet tea, about 6 oz per day. She admits that she does not typically drink water without much throughout the day. You have previously discussed with her the referral for DBS evaluation. As I understand, she was given a DVD to watch.   Her Past Medical History Is Significant For: Past Medical History:  Diagnosis Date  .  Cerebrovascular disease   . Contact dermatitis   . COPD (chronic obstructive pulmonary disease) ([place])   . Depression   . Diverticulosis 10/31/10   On Tranverse colon  . GERD (gastroesophageal reflux disease)   . Myasthenia gravis ([place]) 02/2013   Diagnosed by doctor Manuella Ghazi  . Neuropathy   . Osteoporosis   . Parkinson's disease (tremor, stiffness, slow motion, unstable posture) ([place])   . Stroke ([place])   . Vitamin D deficiency     Her Past Surgical History Is Significant For: Past Surgical History:  Procedure Laterality Date  . ABDOMINAL HYSTERECTOMY  2000   due to excessive bleeeding  . CERVICAL CONE BIOPSY    . Cervical discectomy and fusion  [facility]  . COLONOSCOPY WITH PROPOFOL N/A 12/06/2015   Procedure: COLONOSCOPY WITH PROPOFOL;  Surgeon: Manya Silvas, MD;  Location: [facility] ENDOSCOPY;  Service: Endoscopy;  Laterality: N/A;  . ESOPHAGOGASTRODUODENOSCOPY N/A 10/05/2014   Procedure: ESOPHAGOGASTRODUODENOSCOPY (EGD);  Surgeon: Manya Silvas, MD;  Location: [facility] ENDOSCOPY;  Service: Endoscopy;  Laterality: N/A;  . Repeat cuff surgery  2004   Developed adhesive capsulitis  . ROTATOR CUFF REPAIR Right 2003   Shoulder    Her Family History Is Significant For: Family History  Problem Relation Age of Onset  . Colon cancer Mother   . Stroke Father   . Cancer Sister        Ovarian, stable  .  Breast cancer Maternal Grandmother     Her Social History Is Significant For: Social History   Socioeconomic History  . Marital status: Married    Spouse name: Not on file  . Number of children: Not on file  . Years of education: Not on file  . Highest education level: Not on file  Occupational History  . Not on file  Social Needs  . Financial resource strain: Not on file  . Food insecurity:    Worry: Not on file    Inability: Not on file  . Transportation needs:    Medical: Not on file    Non-medical: Not on file  Tobacco Use  . Smoking status:  Current Every Day Smoker    Packs/day: 1.00    Types: Cigarettes  . Smokeless tobacco: Never Used  . Tobacco comment:  Has been smoking for 30+ years, Cigarettes per day;10.  Substance and Sexual Activity  . Alcohol use: No    Alcohol/week: 0.0 oz  . Drug use: No  . Sexual activity: Not on file  Lifestyle  . Physical activity:    Days per week: Not on file    Minutes per session: Not on file  . Stress: Not on file  Relationships  . Social connections:    Talks on phone: Not on file    Gets together: Not on file    Attends religious service: Not on file    Active member of club or organization: Not on file    Attends meetings of clubs or organizations: Not on file    Relationship status: Not on file  Other Topics Concern  . Not on file  Social History Narrative   Lives at home with family. Ambulates with a walker due to unsteadiness and falls.    Her Allergies Are:  Allergies  Allergen Reactions  . Effexor Xr [Venlafaxine Hcl Er] Other (See Comments)    Reaction:  GI upset   . Fosamax [Alendronate Sodium] Nausea And Vomiting  . Percocet [Oxycodone-Acetaminophen] Swelling  . Erythromycin Swelling and Rash    Pt states that her tongue swells.    Katrinka Blazing [Lidocaine] Swelling and Rash  . Morphine And Related Swelling, Rash and Other (See Comments)    Pt states that her tongue swells.   Pt states that her tongue swells.    . Sulfa Antibiotics Swelling and Rash    TONGUE SWELLING  . Sulfasalazine Rash and Swelling  . Tramadol Swelling and Rash  :   Her Current Medications Are:  Outpatient Encounter Medications as of 08/07/2017  Medication Sig  . acetaminophen (TYLENOL) 325 MG tablet Take 2 tablets (650 mg total) by mouth every 6 (six) hours as needed for mild pain (or Fever >/= 101).  Marland Kitchen buPROPion (WELLBUTRIN SR) 150 MG 12 hr tablet TAKE 1 TABLET BY MOUTH TWO  TIMES DAILY  . carbidopa-levodopa (SINEMET IR) 25-100 MG per tablet Take 1 tablet by mouth 2 (two) times daily.    . carisoprodol (SOMA) 350 MG tablet TAKE 1 TABLET BY MOUTH  TWICE A DAY AS NEEDED  . diclofenac sodium (VOLTAREN) 1 % GEL Apply to affected area 4 times daily  . gabapentin (NEURONTIN) 300 MG capsule Take 1 capsule (300 mg total) by mouth 2 (two) times daily.  Marland Kitchen gabapentin (NEURONTIN) 300 MG capsule TAKE ONE CAPSULE BY MOUTH TWICE DAILY  . gabapentin (NEURONTIN) 600 MG tablet TAKE 1 TABLET BY MOUTH  TWICE A DAY  . IBU 400 MG tablet TAKE 1  TABLET BY MOUTH  TWICE DAILY AS NEEDED FOR  BACK PAIN  . LYRICA 75 MG capsule TAKE ONE CAPSULE BY MOUTH TWICE DAILY  . naproxen (NAPROSYN) 500 MG tablet Take 1 tablet (500 mg total) by mouth 2 (two) times daily with a meal.  . omeprazole (PRILOSEC) 40 MG capsule TAKE 1 CAPSULE BY MOUTH  DAILY  . primidone (MYSOLINE) 250 MG tablet Take 250 mg by mouth 2 (two) times daily.    No facility-administered encounter medications on file as of 08/07/2017.   : Review of Systems:  Out of a complete 14 point review of systems, all are reviewed and negative with the exception of these symptoms as listed below:  Review of Systems  Neurological:       Pt presents today to discuss her tremors. Pt is left handed. Pt notices her tremors in her head, neck, and left hand. Pt notices her tremors when she is tired or upset. Pt notices a slight improvement in her tremors while taking sinemet and primidone.    Objective:  Neurological Exam  Physical Exam Physical Examination:   Vitals:   08/07/17 1001  BP: 105/63  Pulse: 75    General Examination: The patient is a very pleasant 67 y.o. female in no acute distress. She appears well-developed and well-nourished and well groomed.   HEENT: Normocephalic, atraumatic, pupils are equal, round and reactive to light and accommodation. Corrective eye glasses in place. Extraocular tracking is good without limitation to gaze excursion or nystagmus noted. Normal smooth pursuit is noted. Hearing is grossly intact. Face is symmetric  with normal facial animation. Oropharynx exam reveals: moderate mouth dryness, adequate dental hygiene. She has a slight voice tremor. She has no hypophonia. No dysarthria is noted. Neck is supple with full range of passive and active mobility. She has a mild to moderate neck and lower jaw tremor.  Chest: Clear to auscultation without wheezing, rhonchi or crackles noted.  Heart: S1+S2+0, regular and normal without murmurs, rubs or gallops noted.   Abdomen: Soft, non-tender and non-distended with normal bowel sounds appreciated on auscultation.  Extremities: There is no pitting edema in the distal lower extremities bilaterally. Pedal pulses are intact.  Skin: Warm and dry without trophic changes noted.  Musculoskeletal: exam reveals no obvious joint deformities, tenderness or joint swelling or erythema, with the exception of mild arthritic changes noticed in both hands, particularly in the thumb joints..   Neurologically:  Mental status: The patient is awake, alert and oriented in all 4 spheres. Her immediate and remote memory, attention, language skills and fund of knowledge are appropriate. There is no evidence of aphasia, agnosia, apraxia or anomia. Thought process is linear. Mood is normal and affect is normal, with the exception of mild tearfulness later in the visit.  Cranial nerves II - XII are as described above under HEENT exam. In addition: shoulder shrug is normal with equal shoulder height noted. Motor exam: thin bulk, global strength fairly normal throughout, tone is normal, no cogwheel rigidity noted. She has no drift, no resting tremor. Romberg is not tested for safety reasons. On Archimedes spiral drawing she has a mild to moderate tremor with both hands, slightly worse on the left, handwriting not particularly tremulous, not micrographic and legible. She has a mild to moderate bilateral upper extremity postural and action tremor, no intention tremor, tremor slightly more noticeable  in the left hand with posture. Normal bulk, strength and tone is noted. There is no drift. Reflexes are 1+ throughout. Babinski:  Toes are flexor bilaterally. Fine motor skills and coordination: she has mild slowness noted with finger taps and hand movements, no decrement in amplitude, foot taps are fairly normal. She has no resting tremor in the hands or legs/feet. Cerebellar testing: No dysmetria or intention tremor on finger to nose testing. There is no truncal or gait ataxia.  Sensory exam: intact to light touch in the upper and lower extremities.  Gait, station and balance: She stands with difficulty and stance is wide-based. She walks cautiously and slowly, no shuffling or festination noted, no freezing. She walks with a erratic steps, slightly wide-based, is able to walk some without her cane. Tandem walk is not possible for her.  Assessment and Plan:   In summary, CHELBI HERBER is a very pleasant 67 y.o.-year old female with an underlying complex medical history of COPD, prior stroke, vitamin D deficiency, osteoporosis, neuropathy, reflux disease, diverticulosis, and depression, who presents for consultation of her tremor for second opinion. She has been on a higher dose of Mysoline for at least a year. Tremor dates back to at least 9 years ago or so. She has on examination a bilateral upper extremity postural and action tremor, also a fairly prominent head/neck tremor and lower jaw tremor, slight voice tremor. Findings are most likely in keeping with essential tremor, despite absence of family history thereof. I had a long discussion with the patient and her husband. Unfortunately, there is not a whole lot I can add at this point. I would recommend that she discuss with you a referral for DBS consultation. As you know, there are several places that offer DBSs treatment for advanced tremor. I would also recommend that you consider tapering her off of Sinemet. She has a nonspecific/multifactorial gait  disorder, her gait is not classic for parkinsonism. She is advised that her gait disorder is likely due to a combination of her history of neuropathy, taking multiple potentially sedating medications that could affect balance adversely, normal aging, suboptimal hydration with water. We talked about tremor triggers, she is advised to limit her caffeine intake to one serving per day and increase her water intake. For gait safety she is advised to start using her walker which she has at the house. She is advised to follow-up with you to discuss these findings and my recommendations. I answered all their questions today and the patient and her husband were in agreement. Patient is advised to follow up with you.  Thank you very much for allowing me to weigh in.   Sincerely,   Star Age, MD, PhD

## 2017-09-05 ENCOUNTER — Other Ambulatory Visit: Payer: Self-pay | Admitting: Podiatry

## 2017-09-06 NOTE — Telephone Encounter (Signed)
yes

## 2017-09-06 NOTE — Telephone Encounter (Signed)
Per Dr. Milinda Pointer, ok to refill   Script has been sent to pharmacy

## 2017-11-14 ENCOUNTER — Other Ambulatory Visit: Payer: Self-pay | Admitting: Podiatry

## 2017-12-19 ENCOUNTER — Other Ambulatory Visit: Payer: Self-pay | Admitting: Family Medicine

## 2017-12-19 MED ORDER — CARISOPRODOL 350 MG PO TABS: 350.0000 mg | ORAL_TABLET | Freq: Two times a day (BID) | ORAL | 5 refills | Status: DC | PRN

## 2017-12-19 NOTE — Telephone Encounter (Signed)
Pt calling to request a refill be sent to [place]  By [facility] to get the following  Medication. Pt is out of the medication. Thanks CC  carisoprodol (SOMA) 350 MG tablet

## 2018-01-11 ENCOUNTER — Other Ambulatory Visit: Payer: Self-pay | Admitting: Podiatry

## 2018-01-16 NOTE — Telephone Encounter (Signed)
No please refill asap

## 2018-01-17 ENCOUNTER — Ambulatory Visit: Payer: Medicare Other

## 2018-01-18 ENCOUNTER — Other Ambulatory Visit: Payer: Self-pay | Admitting: Podiatry

## 2018-01-18 ENCOUNTER — Other Ambulatory Visit: Payer: Self-pay | Admitting: Family Medicine

## 2018-01-28 ENCOUNTER — Ambulatory Visit: Payer: Medicare Other

## 2018-01-31 ENCOUNTER — Ambulatory Visit: Payer: Medicare Other

## 2018-02-11 ENCOUNTER — Ambulatory Visit (INDEPENDENT_AMBULATORY_CARE_PROVIDER_SITE_OTHER): Payer: Medicare Other

## 2018-02-11 VITALS — BP 112/78 | HR 79 | Temp 98.6°F | Resp 16 | Ht 62.0 in | Wt 120.8 lb

## 2018-02-11 DIAGNOSIS — Z598 Other problems related to housing and economic circumstances: Secondary | ICD-10-CM | POA: Diagnosis not present

## 2018-02-11 DIAGNOSIS — Z23 Encounter for immunization: Secondary | ICD-10-CM

## 2018-02-11 DIAGNOSIS — Z780 Asymptomatic menopausal state: Secondary | ICD-10-CM | POA: Diagnosis not present

## 2018-02-11 DIAGNOSIS — Z1239 Encounter for other screening for malignant neoplasm of breast: Secondary | ICD-10-CM

## 2018-02-11 DIAGNOSIS — Z599 Problem related to housing and economic circumstances, unspecified: Secondary | ICD-10-CM

## 2018-02-11 DIAGNOSIS — Z0000 Encounter for general adult medical examination without abnormal findings: Secondary | ICD-10-CM

## 2018-02-11 NOTE — Patient Instructions (Signed)
[name] , Thank you for taking time to come for your Medicare Wellness Visit. I appreciate your ongoing commitment to your health goals. Please review the following plan we discussed and let me know if I can assist you in the future.   Screening recommendations/referrals: Colonoscopy: Up to date Mammogram: due May 2020 Please call [phone] to schedule your mammogram and bone density Bone Density: ordered Recommended yearly ophthalmology/optometry visit for glaucoma screening and checkup - call [facility] for appt Recommended yearly dental visit for hygiene and checkup  Vaccinations: Influenza vaccine: done today Pneumococcal vaccine: done today Tdap vaccine: due Shingles vaccine: Shingrix discussed. Please contact your pharmacy for additional information.    Advanced directives: Advance directive discussed with you today. I have provided a copy for you to complete at home and have notarized. Once this is complete please bring a copy in to our office so we can scan it into your chart.  Conditions/risks identified: recommend drinking 6-8 glasses of water per day   Next appointment: Please follow up in one year for your Medicare Annual Wellness visit.      Preventive Care 67 Years and Older, Female Preventive care refers to lifestyle choices and visits with your health care provider that can promote health and wellness. What does preventive care include?  A yearly physical exam. This is also called an annual well check.  Dental exams once or twice a year.  Routine eye exams. Ask your health care provider how often you should have your eyes checked.  Personal lifestyle choices, including:  Daily care of your teeth and gums.  Regular physical activity.  Eating a healthy diet.  Avoiding tobacco and drug use.  Limiting alcohol use.  Practicing safe sex.  Taking low-dose aspirin every day.  Taking vitamin and mineral supplements as recommended by your  health care provider. What happens during an annual well check? The services and screenings done by your health care provider during your annual well check will depend on your age, overall health, lifestyle risk factors, and family history of disease. Counseling  Your health care provider may ask you questions about your:  Alcohol use.  Tobacco use.  Drug use.  Emotional well-being.  Home and relationship well-being.  Sexual activity.  Eating habits.  History of falls.  Memory and ability to understand (cognition).  Work and work Statistician.  Reproductive health. Screening  You may have the following tests or measurements:  Height, weight, and BMI.  Blood pressure.  Lipid and cholesterol levels. These may be checked every 5 years, or more frequently if you are over 67 years old.  Skin check.  Lung cancer screening. You may have this screening every year starting at age 75 if you have a 30-pack-year history of smoking and currently smoke or have quit within the past 15 years.  Fecal occult blood test (FOBT) of the stool. You may have this test every year starting at age 67.  Flexible sigmoidoscopy or colonoscopy. You may have a sigmoidoscopy every 5 years or a colonoscopy every 10 years starting at age 38.  Hepatitis C blood test.  Hepatitis B blood test.  Sexually transmitted disease (STD) testing.  Diabetes screening. This is done by checking your blood sugar (glucose) after you have not eaten for a while (fasting). You may have this done every 1-3 years.  Bone density scan. This is done to screen for osteoporosis. You may have this done starting at age 23.  Mammogram. This may be done  every 1-2 years. Talk to your health care provider about how often you should have regular mammograms. Talk with your health care provider about your test results, treatment options, and if necessary, the need for more tests. Vaccines  Your health care provider may recommend  certain vaccines, such as:  Influenza vaccine. This is recommended every year.  Tetanus, diphtheria, and acellular pertussis (Tdap, Td) vaccine. You may need a Td booster every 10 years.  Zoster vaccine. You may need this after age 57.  Pneumococcal 13-valent conjugate (PCV13) vaccine. One dose is recommended after age 36.  Pneumococcal polysaccharide (PPSV23) vaccine. One dose is recommended after age 35. Talk to your health care provider about which screenings and vaccines you need and how often you need them. This information is not intended to replace advice given to you by your health care provider. Make sure you discuss any questions you have with your health care provider. Document Released: 04/23/2015 Document Revised: 12/15/2015 Document Reviewed: 01/26/2015 Elsevier Interactive Patient Education  2017 [place] Prevention in the Home Falls can cause injuries. They can happen to people of all ages. There are many things you can do to make your home safe and to help prevent falls. What can I do on the outside of my home?  Regularly fix the edges of walkways and driveways and fix any cracks.  Remove anything that might make you trip as you walk through a door, such as a raised step or threshold.  Trim any bushes or trees on the path to your home.  Use bright outdoor lighting.  Clear any walking paths of anything that might make someone trip, such as rocks or tools.  Regularly check to see if handrails are loose or broken. Make sure that both sides of any steps have handrails.  Any raised decks and porches should have guardrails on the edges.  Have any leaves, snow, or ice cleared regularly.  Use sand or salt on walking paths during winter.  Clean up any spills in your garage right away. This includes oil or grease spills. What can I do in the bathroom?  Use night lights.  Install grab bars by the toilet and in the tub and shower. Do not use towel bars as  grab bars.  Use non-skid mats or decals in the tub or shower.  If you need to sit down in the shower, use a plastic, non-slip stool.  Keep the floor dry. Clean up any water that spills on the floor as soon as it happens.  Remove soap buildup in the tub or shower regularly.  Attach bath mats securely with double-sided non-slip rug tape.  Do not have throw rugs and other things on the floor that can make you trip. What can I do in the bedroom?  Use night lights.  Make sure that you have a light by your bed that is easy to reach.  Do not use any sheets or blankets that are too big for your bed. They should not hang down onto the floor.  Have a firm chair that has side arms. You can use this for support while you get dressed.  Do not have throw rugs and other things on the floor that can make you trip. What can I do in the kitchen?  Clean up any spills right away.  Avoid walking on wet floors.  Keep items that you use a lot in easy-to-reach places.  If you need to reach something above you, use a  strong step stool that has a grab bar.  Keep electrical cords out of the way.  Do not use floor polish or wax that makes floors slippery. If you must use wax, use non-skid floor wax.  Do not have throw rugs and other things on the floor that can make you trip. What can I do with my stairs?  Do not leave any items on the stairs.  Make sure that there are handrails on both sides of the stairs and use them. Fix handrails that are broken or loose. Make sure that handrails are as long as the stairways.  Check any carpeting to make sure that it is firmly attached to the stairs. Fix any carpet that is loose or worn.  Avoid having throw rugs at the top or bottom of the stairs. If you do have throw rugs, attach them to the floor with carpet tape.  Make sure that you have a light switch at the top of the stairs and the bottom of the stairs. If you do not have them, ask someone to add them  for you. What else can I do to help prevent falls?  Wear shoes that:  Do not have high heels.  Have rubber bottoms.  Are comfortable and fit you well.  Are closed at the toe. Do not wear sandals.  If you use a stepladder:  Make sure that it is fully opened. Do not climb a closed stepladder.  Make sure that both sides of the stepladder are locked into place.  Ask someone to hold it for you, if possible.  Clearly mark and make sure that you can see:  Any grab bars or handrails.  First and last steps.  Where the edge of each step is.  Use tools that help you move around (mobility aids) if they are needed. These include:  Canes.  Walkers.  Scooters.  Crutches.  Turn on the lights when you go into a dark area. Replace any light bulbs as soon as they burn out.  Set up your furniture so you have a clear path. Avoid moving your furniture around.  If any of your floors are uneven, fix them.  If there are any pets around you, be aware of where they are.  Review your medicines with your doctor. Some medicines can make you feel dizzy. This can increase your chance of falling. Ask your doctor what other things that you can do to help prevent falls. This information is not intended to replace advice given to you by your health care provider. Make sure you discuss any questions you have with your health care provider. Document Released: 01/21/2009 Document Revised: 09/02/2015 Document Reviewed: 05/01/2014 Elsevier Interactive Patient Education  2017 [employer].Pneumococcal Polysaccharide Vaccine: What You Need to Know 1. Why get vaccinated? Vaccination can protect older adults (and some children and younger adults) from pneumococcal disease. Pneumococcal disease is caused by bacteria that can spread from person to person through close contact. It can cause ear infections, and it can also lead to more serious infections of the:  Lungs (pneumonia),  Blood (bacteremia),  and  Covering of the brain and spinal cord (meningitis). Meningitis can cause deafness and brain damage, and it can be fatal.  Anyone can get pneumococcal disease, but children under 36 years of age, people with certain medical conditions, adults over 57 years of age, and cigarette smokers are at the highest risk. About 18,000 older adults die each year from pneumococcal disease in the [place]. Treatment  of pneumococcal infections with penicillin and other drugs used to be more effective. But some strains of the disease have become resistant to these drugs. This makes prevention of the disease, through vaccination, even more important. 2. Pneumococcal polysaccharide vaccine (PPSV23) Pneumococcal polysaccharide vaccine (PPSV23) protects against 23 types of pneumococcal bacteria. It will not prevent all pneumococcal disease. PPSV23 is recommended for:  All adults 15 years of age and older,  Anyone 2 through 68 years of age with certain long-term health problems,  Anyone 2 through 67 years of age with a weakened immune system,  Adults 70 through 67 years of age who smoke cigarettes or have asthma.  Most people need only one dose of PPSV. A second dose is recommended for certain high-risk groups. People 82 and older should get a dose even if they have gotten one or more doses of the vaccine before they turned 65. Your healthcare provider can give you more information about these recommendations. Most healthy adults develop protection within 2 to 3 weeks of getting the shot. 3. Some people should not get this vaccine  Anyone who has had a life-threatening allergic reaction to PPSV should not get another dose.  Anyone who has a severe allergy to any component of PPSV should not receive it. Tell your provider if you have any severe allergies.  Anyone who is moderately or severely ill when the shot is scheduled may be asked to wait until they recover before getting the vaccine. Someone with  a mild illness can usually be vaccinated.  Children less than 76 years of age should not receive this vaccine.  There is no evidence that PPSV is harmful to either a pregnant woman or to her fetus. However, as a precaution, women who need the vaccine should be vaccinated before becoming pregnant, if possible. 4. Risks of a vaccine reaction With any medicine, including vaccines, there is a chance of side effects. These are usually mild and go away on their own, but serious reactions are also possible. About half of people who get PPSV have mild side effects, such as redness or pain where the shot is given, which go away within about two days. Less than 1 out of 100 people develop a fever, muscle aches, or more severe local reactions. Problems that could happen after any vaccine:  People sometimes faint after a medical procedure, including vaccination. Sitting or lying down for about 15 minutes can help prevent fainting, and injuries caused by a fall. Tell your doctor if you feel dizzy, or have vision changes or ringing in the ears.  Some people get severe pain in the shoulder and have difficulty moving the arm where a shot was given. This happens very rarely.  Any medication can cause a severe allergic reaction. Such reactions from a vaccine are very rare, estimated at about 1 in a million doses, and would happen within a few minutes to a few hours after the vaccination. As with any medicine, there is a very remote chance of a vaccine causing a serious injury or death. The safety of vaccines is always being monitored. For more information, visit: http://www.aguilar.org/ 5. What if there is a serious reaction? What should I look for? Look for anything that concerns you, such as signs of a severe allergic reaction, very high fever, or unusual behavior. Signs of a severe allergic reaction can include hives, swelling of the face and throat, difficulty breathing, a fast heartbeat, dizziness, and  weakness. These would usually start a few minutes  to a few hours after the vaccination. What should I do? If you think it is a severe allergic reaction or other emergency that can't wait, call 9-1-1 or get to the nearest hospital. Otherwise, call your doctor. Afterward, the reaction should be reported to the Vaccine Adverse Event Reporting System (VAERS). Your doctor might file this report, or you can do it yourself through the VAERS web site at www.vaers.SamedayNews.es, or by calling 870-145-4317. VAERS does not give medical advice. 6. How can I learn more?  Ask your doctor. He or she can give you the vaccine package insert or suggest other sources of information.  Call your local or state health department.  Contact the Centers for Disease Control and Prevention (CDC): ? Call 250-073-7487 (1-800-CDC-INFO) or ? Visit CDC's website at http://hunter.com/ CDC Pneumococcal Polysaccharide Vaccine VIS (08/01/13) This information is not intended to replace advice given to you by your health care provider. Make sure you discuss any questions you have with your health care provider. Document Released: 01/22/2006 Document Revised: 12/16/2015 Document Reviewed: 12/16/2015 Elsevier Interactive Patient Education  2017 [place]. Influenza (Flu) Vaccine (Inactivated or Recombinant): What You Need to Know 1. Why get vaccinated? Influenza ("flu") is a contagious disease that spreads around the [place] every year, usually between October and May. Flu is caused by influenza viruses, and is spread mainly by coughing, sneezing, and close contact. Anyone can get flu. Flu strikes suddenly and can last several days. Symptoms vary by age, but can include:  fever/chills  sore throat  muscle aches  fatigue  cough  headache  runny or stuffy nose  Flu can also lead to pneumonia and blood infections, and cause diarrhea and seizures in children. If you have a medical condition, such as heart or lung  disease, flu can make it worse. Flu is more dangerous for some people. Infants and young children, people 54 years of age and older, pregnant women, and people with certain health conditions or a weakened immune system are at greatest risk. Each year thousands of people in the [place] die from flu, and many more are hospitalized. Flu vaccine can:  keep you from getting flu,  make flu less severe if you do get it, and  keep you from spreading flu to your family and other people. 2. Inactivated and recombinant flu vaccines A dose of flu vaccine is recommended every flu season. Children 6 months through 40 years of age may need two doses during the same flu season. Everyone else needs only one dose each flu season. Some inactivated flu vaccines contain a very small amount of a mercury-based preservative called thimerosal. Studies have not shown thimerosal in vaccines to be harmful, but flu vaccines that do not contain thimerosal are available. There is no live flu virus in flu shots. They cannot cause the flu. There are many flu viruses, and they are always changing. Each year a new flu vaccine is made to protect against three or four viruses that are likely to cause disease in the upcoming flu season. But even when the vaccine doesn't exactly match these viruses, it may still provide some protection. Flu vaccine cannot prevent:  flu that is caused by a virus not covered by the vaccine, or  illnesses that look like flu but are not.  It takes about 2 weeks for protection to develop after vaccination, and protection lasts through the flu season. 3. Some people should not get this vaccine Tell the person who is giving you the  vaccine:  If you have any severe, life-threatening allergies. If you ever had a life-threatening allergic reaction after a dose of flu vaccine, or have a severe allergy to any part of this vaccine, you may be advised not to get vaccinated. Most, but not all, types of flu  vaccine contain a small amount of egg protein.  If you ever had Guillain-Barr Syndrome (also called GBS). Some people with a history of GBS should not get this vaccine. This should be discussed with your doctor.  If you are not feeling well. It is usually okay to get flu vaccine when you have a mild illness, but you might be asked to come back when you feel better.  4. Risks of a vaccine reaction With any medicine, including vaccines, there is a chance of reactions. These are usually mild and go away on their own, but serious reactions are also possible. Most people who get a flu shot do not have any problems with it. Minor problems following a flu shot include:  soreness, redness, or swelling where the shot was given  hoarseness  sore, red or itchy eyes  cough  fever  aches  headache  itching  fatigue  If these problems occur, they usually begin soon after the shot and last 1 or 2 days. More serious problems following a flu shot can include the following:  There may be a small increased risk of Guillain-Barre Syndrome (GBS) after inactivated flu vaccine. This risk has been estimated at 1 or 2 additional cases per million people vaccinated. This is much lower than the risk of severe complications from flu, which can be prevented by flu vaccine.  Young children who get the flu shot along with pneumococcal vaccine (PCV13) and/or DTaP vaccine at the same time might be slightly more likely to have a seizure caused by fever. Ask your doctor for more information. Tell your doctor if a child who is getting flu vaccine has ever had a seizure.  Problems that could happen after any injected vaccine:  People sometimes faint after a medical procedure, including vaccination. Sitting or lying down for about 15 minutes can help prevent fainting, and injuries caused by a fall. Tell your doctor if you feel dizzy, or have vision changes or ringing in the ears.  Some people get severe pain in  the shoulder and have difficulty moving the arm where a shot was given. This happens very rarely.  Any medication can cause a severe allergic reaction. Such reactions from a vaccine are very rare, estimated at about 1 in a million doses, and would happen within a few minutes to a few hours after the vaccination. As with any medicine, there is a very remote chance of a vaccine causing a serious injury or death. The safety of vaccines is always being monitored. For more information, visit: http://www.aguilar.org/ 5. What if there is a serious reaction? What should I look for? Look for anything that concerns you, such as signs of a severe allergic reaction, very high fever, or unusual behavior. Signs of a severe allergic reaction can include hives, swelling of the face and throat, difficulty breathing, a fast heartbeat, dizziness, and weakness. These would start a few minutes to a few hours after the vaccination. What should I do?  If you think it is a severe allergic reaction or other emergency that can't wait, call 9-1-1 and get the person to the nearest hospital. Otherwise, call your doctor.  Reactions should be reported to the Vaccine Adverse Event  Reporting System (VAERS). Your doctor should file this report, or you can do it yourself through the VAERS web site at www.vaers.SamedayNews.es, or by calling [phone]. ? VAERS does not give medical advice. 6. The National Vaccine Injury Compensation Program The [employer] Vaccine Injury Compensation Program (VICP) is a federal program that was created to compensate people who may have been injured by certain vaccines. Persons who believe they may have been injured by a vaccine can learn about the program and about filing a claim by calling 781-429-8380 or visiting the [place] website at GoldCloset.com.ee. There is a time limit to file a claim for compensation. 7. How can I learn more?  Ask your healthcare provider. He or she can give  you the vaccine package insert or suggest other sources of information.  Call your local or state health department.  Contact the Centers for Disease Control and Prevention (CDC): ? Call (731)883-6126 (1-800-CDC-INFO) or ? Visit CDC's website at https://gibson.com/ Vaccine Information Statement, Inactivated Influenza Vaccine (11/14/2013) This information is not intended to replace advice given to you by your health care provider. Make sure you discuss any questions you have with your health care provider. Document Released: 01/19/2006 Document Revised: 12/16/2015 Document Reviewed: 12/16/2015 Elsevier Interactive Patient Education  2017 [employer].

## 2018-02-11 NOTE — Progress Notes (Signed)
Subjective:   Alexandria Rodriguez is a 67 y.o. female who presents for Medicare Annual (Subsequent) preventive examination.  Review of Systems:   Cardiac Risk Factors include: advanced age (>50men, >16 women);smoking/ tobacco exposure;sedentary lifestyle     Objective:     Vitals: BP 112/78 (BP Location: Right Arm, Patient Position: Sitting, Cuff Size: Normal)   Pulse 79   Temp 98.6 F (37 C) (Oral)   Resp 16   Ht 5\' 2"  (1.575 m)   Wt 120 lb 12.8 oz (54.8 kg)   SpO2 97%   BMI 22.09 kg/m   Body mass index is 22.09 kg/m.  Advanced Directives 02/11/2018 01/10/2017 05/08/2016 04/10/2016 04/10/2016 12/06/2015 02/01/2015  Does Patient Have a Medical Advance Directive? No No No No No No No  Would patient like information on creating a medical advance directive? Yes (MAU/Ambulatory/Procedural Areas - Information given) Yes (MAU/Ambulatory/Procedural Areas - Information given) Yes (MAU/Ambulatory/Procedural Areas - Information given) No - Patient declined - - No - patient declined information    Tobacco Social History   Tobacco Use  Smoking Status Current Every Day Smoker  . Packs/day: 1.00  . Types: Cigarettes  Smokeless Tobacco Never Used  Tobacco Comment    Has been smoking for 30+ years, Cigarettes per day;10.     Ready to quit: No Counseling given: Not Answered Comment:  Has been smoking for 30+ years, Cigarettes per day;10.   Clinical Intake:  Pre-visit preparation completed: Yes  Pain : 0-10 Pain Score: 8  Pain Type: Neuropathic pain, Chronic pain Pain Location: Back(neuropathy both feet) Pain Orientation: Right, Left Pain Descriptors / Indicators: Aching, Burning Pain Onset: More than a month ago Pain Frequency: Constant Pain Relieving Factors: neuropathy medications  Pain Relieving Factors: neuropathy medications  Nutritional Status: BMI of 19-24  Normal Diabetes: No  How often do you need to have someone help you when you read instructions, pamphlets, or other  written materials from your doctor or pharmacy?: 1 - Never What is the last grade level you completed in school?: some college  Interpreter Needed?: No  Comments: accompanied today by husband Jaquelyn Bitter Information entered by :: Clemetine Marker LPN  Past Medical History:  Diagnosis Date  . Anxiety   . Cataract   . Cerebrovascular disease   . Chronic pain    back pain  . Contact dermatitis   . COPD (chronic obstructive pulmonary disease) ([place])   . Depression   . Diverticulosis 10/31/10   On Tranverse colon  . GERD (gastroesophageal reflux disease)   . Myasthenia gravis ([place]) 02/2013   Diagnosed by doctor Manuella Ghazi  . Neuropathy   . Osteoporosis   . Parkinson's disease (tremor, stiffness, slow motion, unstable posture) ([place])   . Stroke ([place])   . Vitamin D deficiency    Past Surgical History:  Procedure Laterality Date  . ABDOMINAL HYSTERECTOMY  2000   due to excessive bleeeding  . CERVICAL CONE BIOPSY    . Cervical discectomy and fusion  [facility]  . COLONOSCOPY WITH PROPOFOL N/A 12/06/2015   Procedure: COLONOSCOPY WITH PROPOFOL;  Surgeon: Manya Silvas, MD;  Location: [facility] ENDOSCOPY;  Service: Endoscopy;  Laterality: N/A;  . ESOPHAGOGASTRODUODENOSCOPY N/A 10/05/2014   Procedure: ESOPHAGOGASTRODUODENOSCOPY (EGD);  Surgeon: Manya Silvas, MD;  Location: [facility] ENDOSCOPY;  Service: Endoscopy;  Laterality: N/A;  . Repeat cuff surgery  2004   Developed adhesive capsulitis  . ROTATOR CUFF REPAIR Right 2003   Shoulder   Family History  Problem Relation Age of Onset  . Colon cancer Mother   . Stroke Father   . Cancer Sister        Ovarian, stable  . Breast cancer Maternal Grandmother    Social History   Socioeconomic History  . Marital status: Married    Spouse name: Jaquelyn Bitter  . Number of children: 3  . Years of education: Not on file  . Highest education level: Some college, no degree  Occupational History  . Occupation: disabled  Social Needs  .  Financial resource strain: Somewhat hard  . Food insecurity:    Worry: Often true    Inability: Never true  . Transportation needs:    Medical: No    Non-medical: No  Tobacco Use  . Smoking status: Current Every Day Smoker    Packs/day: 1.00    Types: Cigarettes  . Smokeless tobacco: Never Used  . Tobacco comment:  Has been smoking for 30+ years, Cigarettes per day;10.  Substance and Sexual Activity  . Alcohol use: No    Alcohol/week: 0.0 standard drinks  . Drug use: No  . Sexual activity: Not on file  Lifestyle  . Physical activity:    Days per week: 0 days    Minutes per session: 0 min  . Stress: Patient refused  Relationships  . Social connections:    Talks on phone: More than three times a week    Gets together: More than three times a week    Attends religious service: Never    Active member of club or organization: No    Attends meetings of clubs or organizations: Never    Relationship status: Married  Other Topics Concern  . Not on file  Social History Narrative   Ambulates with walker rollator or using assitance holding on to walls or solid surfaces.     Outpatient Encounter Medications as of 02/11/2018  Medication Sig  . acetaminophen (TYLENOL) 325 MG tablet Take 2 tablets (650 mg total) by mouth every 6 (six) hours as needed for mild pain (or Fever >/= 101).  Marland Kitchen buPROPion (WELLBUTRIN SR) 150 MG 12 hr tablet TAKE 1 TABLET BY MOUTH TWO  TIMES DAILY  . busPIRone (BUSPAR) 5 MG tablet TAKE 1 TABLET BY MOUTH TWO  TIMES DAILY  . carbidopa-levodopa (SINEMET IR) 25-100 MG per tablet Take 1 tablet by mouth 2 (two) times daily.   . carisoprodol (SOMA) 350 MG tablet Take 1 tablet (350 mg total) by mouth 2 (two) times daily as needed.  . diclofenac sodium (VOLTAREN) 1 % GEL Apply to affected area 4 times daily  . gabapentin (NEURONTIN) 300 MG capsule TAKE ONE CAPSULE BY MOUTH TWICE DAILY  . gabapentin (NEURONTIN) 600 MG tablet TAKE 1 TABLET BY MOUTH  TWICE A DAY  . IBU 400  MG tablet TAKE 1 TABLET BY MOUTH  TWICE DAILY AS NEEDED FOR  BACK PAIN  . naproxen (NAPROSYN) 500 MG tablet Take 1 tablet (500 mg total) by mouth 2 (two) times daily with a meal.  . omeprazole (PRILOSEC) 40 MG capsule TAKE 1 CAPSULE BY MOUTH  DAILY  . primidone (MYSOLINE) 250 MG tablet Take 250 mg by mouth 2 (two) times daily.   Marland Kitchen gabapentin (NEURONTIN) 300 MG capsule Take 1 capsule (300 mg total) by mouth 2 (two) times daily.  Marland Kitchen LYRICA 75 MG capsule TAKE ONE CAPSULE BY MOUTH TWICE DAILY (Patient not taking: Reported on 02/11/2018)   No facility-administered encounter medications on file as of 02/11/2018.  Activities of Daily Living In your present state of health, do you have any difficulty performing the following activities: 02/11/2018  Hearing? N  Comment declines hearing aids  Vision? Y  Comment wears eye glasses and has been referred to [facility] for cataracts  Difficulty concentrating or making decisions? Y  Comment pt loses track of days of the week, difficulty with short term memory  Walking or climbing stairs? Y  Dressing or bathing? Y  Comment uses assistive devices to aid with bathing; pain causes difficulty  Doing errands, shopping? Y  Comment husband assists with driving and errands  Preparing Food and eating ? Y  Comment difficulty standing long enough to cook more than basic meals  Using the Toilet? N  In the past six months, have you accidently leaked urine? Y  Comment wears pads for protection due to frequent bladder control issues  Do you have problems with loss of bowel control? N  Managing your Medications? N  Managing your Finances? N  Housekeeping or managing your Housekeeping? N  Some recent data might be hidden    Patient Care Team: Birdie Sons, MD as PCP - General (Family Medicine) Vladimir Crofts, MD (Neurology) Clydell Hakim, MD as Consulting Physician (Anesthesiology)    Assessment:   This is a routine wellness examination for  Alexandria Rodriguez.  Exercise Activities and Dietary recommendations Current Exercise Habits: The patient does not participate in regular exercise at present, Exercise limited by: orthopedic condition(s);neurologic condition(s)(chronic pain and neuropathy)  Goals    . Quit smoking / using tobacco     Recommend eliminating smoking habits.       Fall Risk Fall Risk  02/11/2018 01/10/2017  Falls in the past year? 0 Yes  Comment near miss falls due impaired gait -  Number falls in past yr: 0 2 or more  Injury with Fall? - Yes  Risk for fall due to : Impaired mobility Impaired balance/gait;Impaired vision;Medication side effect  Risk for fall due to: Comment - neuropathy in feet and essential tremors; wears eyeglasses  Follow up Falls prevention discussed;Falls evaluation completed Education provided;Falls prevention discussed   FALL RISK PREVENTION PERTAINING TO THE HOME:  Any stairs in or around the home WITH handrails? Yes  Home free of loose throw rugs in walkways, pet beds, electrical cords, etc? Yes  Adequate lighting in your home to reduce risk of falls? Yes   ASSISTIVE DEVICES UTILIZED TO PREVENT FALLS:  Life alert? No  Use of a cane, walker or w/c? Yes  Grab bars in the bathroom? Yes  Shower chair or bench in shower? Yes  Elevated toilet seat or a handicapped toilet? No   DME ORDERS:  DME order needed?  No   TIMED UP AND GO:  Was the test performed? No . Pt remained in walker rollator  GAIT:  Appearance of gait:  Gait slow, unsteady  with the use of an assistive device.  Education: Fall risk prevention has been discussed.  Intervention(s) required? No   Depression Screen PHQ 2/9 Scores 02/11/2018 01/10/2017 01/10/2017  PHQ - 2 Score 2 1 0  PHQ- 9 Score 9 14 -     Cognitive Function     6CIT Screen 02/11/2018 01/10/2017  What Year? 0 points 0 points  What month? 0 points 0 points  What time? 0 points 0 points  Count back from 20 0 points 0 points  Months in reverse 0  points 0 points  Repeat phrase 4 points 2 points  Total Score 4 2    Immunization History  Administered Date(s) Administered  . Influenza Split 02/16/2011  . Influenza, High Dose Seasonal PF 12/28/2015, 01/10/2017, 02/11/2018  . Influenza,inj,Quad PF,6+ Mos 12/27/2012, 12/31/2013  . Pneumococcal Conjugate-13 01/10/2017  . Pneumococcal Polysaccharide-23 02/16/2011, 02/11/2018    Qualifies for Shingles Vaccine? Yes . Due for Shingrix. Education has been provided regarding the importance of this vaccine. Pt has been advised to call insurance company to determine out of pocket expense. Advised may also receive vaccine at local pharmacy or Health Dept. Verbalized acceptance and understanding.  Tdap: Although this vaccine is not a covered service during a Wellness Exam, does the patient still wish to receive this vaccine today?  No .  Education has been provided regarding the importance of this vaccine. Advised may receive this vaccine at local pharmacy or Health Dept. Aware to provide a copy of the vaccination record if obtained from local pharmacy or Health Dept. Verbalized acceptance and understanding.  Flu Vaccine: Due for Flu vaccine. Does the patient want to receive this vaccine today?  Yes .   Pneumococcal Vaccine: Due for Pneumococcal vaccine. Does the patient want to receive this vaccine today?  Yes .  Screening Tests Health Maintenance  Topic Date Due  . DEXA SCAN  01/24/2015  . INFLUENZA VACCINE  11/08/2017  . PNA vac Low Risk Adult (2 of 2 - PPSV23) 01/10/2018  . TETANUS/TDAP  02/12/2019 (Originally 11/09/1969)  . MAMMOGRAM  08/16/2018  . COLONOSCOPY  12/05/2020  . Hepatitis C Screening  Completed    Cancer Screenings:  Colorectal Screening: Completed 12/06/15. Repeat every 5 years;   Mammogram: Completed 08/15/16.  Ordered today for following year.Marland Kitchen Pt provided with contact info and advised to call to schedule appt.  Bone Density: Completed 01/23/13.  Repeat every 2 years.  Ordered today. Pt provided with contact info and advised to call to schedule appt.   Lung Cancer Screening: (Low Dose CT Chest recommended if Age 87-80 years, 30 pack-year currently smoking OR have quit w/in 15years.) does qualify.   Lung Cancer Screening Referral: An Epic message has been sent to Burgess Estelle, RN (Oncology Nurse Navigator) regarding the possible need for this exam. Raquel Sarna will review the patient's chart to determine if the patient truly qualifies for the exam. If the patient qualifies, Raquel Sarna will order the Low Dose CT of the chest to facilitate the scheduling of this exam.  Additional Screening:  Hepatitis C Screening: does qualify; Completed 01/10/17.  Vision Screening: Recommended annual ophthalmology exams for early detection of glaucoma and other disorders of the eye. Is the patient up to date with their annual eye exam?  No  Who is the provider or what is the name of the office in which the pt attends annual eye exams? [facility] - pt planning to schedule appt for cataracts   Dental Screening: Recommended annual dental exams for proper oral hygiene  Community Resource Referral:  CRR required this visit?  Yes   Pt c/o financial strain due to fixed income. She is unable to afford Lyrica for neuropathy pain while in the donut hole for medication expense. She would also like additional information on local food banks. Time limited during visit to review all available local options.      Plan:    I have personally reviewed and addressed the Medicare Annual Wellness questionnaire and have noted the following in the patient's chart:  A. Medical and social history B. Use of alcohol, tobacco or illicit  drugs  C. Current medications and supplements D. Functional ability and status E.  Nutritional status F.  Physical activity G. Advance directives H. List of other physicians I.  Hospitalizations, surgeries, and ER visits in previous 12 months J.   [place] such as hearing and vision if needed, cognitive and depression L. Referrals and appointments   In addition, I have reviewed and discussed with patient certain preventive protocols, quality metrics, and best practice recommendations. A written personalized care plan for preventive services as well as general preventive health recommendations were provided to patient.   Signed,  Clemetine Marker, LPN Nurse Health Advisor   Nurse Notes: advised patient to schedule appt for physical with Dr. Caryn Section to discuss several topics reviewed in today's wellness exam. Patient was tearful during most of visit due to c/o pain, financial difficulty and frustration with not being as independent as she used to be. PHQ 9 score of 12. Will send community resource referral to care guide for follow up. Message sent to oncology for lung cancer screening.

## 2018-02-13 ENCOUNTER — Telehealth: Payer: Self-pay | Admitting: *Deleted

## 2018-02-13 ENCOUNTER — Encounter: Payer: Self-pay | Admitting: *Deleted

## 2018-02-13 DIAGNOSIS — Z122 Encounter for screening for malignant neoplasm of respiratory organs: Secondary | ICD-10-CM

## 2018-02-13 NOTE — Telephone Encounter (Signed)
Received a referral for initial lung cancer screening scan.  Contacted the patient and obtained their smoking history, current smoker 1 ppd with 42pkyr history   as well as answering questions related to screening process.  Patient denies signs of lung cancer such as weight loss or hemoptysis at this time.  Patient denies comorbidity that would prevent curative treatment if lung cancer were found.  Patient is scheduled for the Shared Decision Making Visit and CT scan on 03-12-18@1400 

## 2018-02-27 ENCOUNTER — Telehealth: Payer: Self-pay | Admitting: Family Medicine

## 2018-02-27 MED ORDER — TRIAMCINOLONE ACETONIDE 0.1 % EX OINT: TOPICAL_OINTMENT | CUTANEOUS | 0 refills | Status: DC

## 2018-02-27 NOTE — Telephone Encounter (Signed)
Pt's skin is starting to get bad and flair up again. Pt is almost out of her ointment: Triamcinolone acetonite USP 0.1%   Please fill at:  [facility] [place], [place] [phone] (Phone) [phone] (Fax)   Thanks, [employer]

## 2018-02-27 NOTE — Telephone Encounter (Signed)
The original order was written on 12/09/2014-02/01/2015 and was discontinue afterwards. Patient last ov was on 05/15/2017. Please advise.

## 2018-03-11 ENCOUNTER — Telehealth: Payer: Self-pay | Admitting: Family Medicine

## 2018-03-11 NOTE — Telephone Encounter (Signed)
I left a message asking the patient to call me at [phone] about community resource referral (food assistance).  VDM (DD)

## 2018-03-12 ENCOUNTER — Inpatient Hospital Stay: Payer: Medicare Other | Attending: Oncology | Admitting: Oncology

## 2018-03-12 ENCOUNTER — Ambulatory Visit
Admission: RE | Admit: 2018-03-12 | Discharge: 2018-03-12 | Disposition: A | Discharge status: Home / Self Care | Payer: Medicare Other | Source: Ambulatory Visit | Attending: Oncology | Admitting: Oncology

## 2018-03-12 DIAGNOSIS — Z87891 Personal history of nicotine dependence: Secondary | ICD-10-CM | POA: Diagnosis not present

## 2018-03-12 DIAGNOSIS — Z122 Encounter for screening for malignant neoplasm of respiratory organs: Secondary | ICD-10-CM | POA: Diagnosis not present

## 2018-03-12 NOTE — Progress Notes (Signed)
In accordance with CMS guidelines, patient has met eligibility criteria including age, absence of signs or symptoms of lung cancer.  Social History   Tobacco Use  . Smoking status: Current Every Day Smoker    Packs/day: 1.00    Years: 42.00    Pack years: 42.00    Types: Cigarettes  . Smokeless tobacco: Never Used  . Tobacco comment:  Has been smoking for 30+ years, Cigarettes per day;10.  Substance Use Topics  . Alcohol use: No    Alcohol/week: 0.0 standard drinks  . Drug use: No     A shared decision-making session was conducted prior to the performance of CT scan. This includes one or more decision aids, includes benefits and harms of screening, follow-up diagnostic testing, over-diagnosis, false positive rate, and total radiation exposure.  Counseling on the importance of adherence to annual lung cancer LDCT screening, impact of co-morbidities, and ability or willingness to undergo diagnosis and treatment is imperative for compliance of the program.  Counseling on the importance of continued smoking cessation for former smokers; the importance of smoking cessation for current smokers, and information about tobacco cessation interventions have been given to patient including [place] and 1800 quit [place] programs.  Written order for lung cancer screening with LDCT has been given to the patient and any and all questions have been answered to the best of my abilities.   Yearly follow up will be coordinated by Burgess Estelle, Thoracic Navigator.  Faythe Casa, NP 03/12/2018 2:23 PM

## 2018-03-14 ENCOUNTER — Encounter: Payer: Self-pay | Admitting: Family Medicine

## 2018-03-14 ENCOUNTER — Telehealth: Payer: Self-pay | Admitting: *Deleted

## 2018-03-14 DIAGNOSIS — I700 Atherosclerosis of aorta: Secondary | ICD-10-CM | POA: Insufficient documentation

## 2018-03-14 NOTE — Telephone Encounter (Signed)
Notified patient of LDCT lung cancer screening program results with recommendation for 6 month follow up imaging. Also notified of incidental findings noted below and is encouraged to discuss further with PCP who will receive a copy of this note and/or the CT report. Patient verbalizes understanding.   IMPRESSION: 1. Part solid right upper lobe lesion and subpleural left upper lobe nodule. While scarring is favored, in the absence of prior imaging, lesions are characterized as Lung-RADS 3, probably benign findings. Short-term follow-up in 6 months is recommended with repeat low-dose chest CT without contrast (please use the following order, "CT CHEST LCS NODULE FOLLOW-UP W/O CM"). 2.  Aortic atherosclerosis (ICD10-170.0). 3.  Emphysema (ICD10-J43.9).

## 2018-03-15 ENCOUNTER — Ambulatory Visit (INDEPENDENT_AMBULATORY_CARE_PROVIDER_SITE_OTHER): Payer: Medicare Other | Admitting: Family Medicine

## 2018-03-15 ENCOUNTER — Encounter: Payer: Self-pay | Admitting: Family Medicine

## 2018-03-15 VITALS — BP 126/70 | HR 80 | Temp 98.3°F | Resp 16 | Wt 122.0 lb

## 2018-03-15 DIAGNOSIS — Z0000 Encounter for general adult medical examination without abnormal findings: Secondary | ICD-10-CM | POA: Diagnosis not present

## 2018-03-15 DIAGNOSIS — E538 Deficiency of other specified B group vitamins: Secondary | ICD-10-CM

## 2018-03-15 DIAGNOSIS — Z720 Tobacco use: Secondary | ICD-10-CM

## 2018-03-15 DIAGNOSIS — H539 Unspecified visual disturbance: Secondary | ICD-10-CM

## 2018-03-15 DIAGNOSIS — E559 Vitamin D deficiency, unspecified: Secondary | ICD-10-CM | POA: Diagnosis not present

## 2018-03-15 DIAGNOSIS — G629 Polyneuropathy, unspecified: Secondary | ICD-10-CM

## 2018-03-15 DIAGNOSIS — R911 Solitary pulmonary nodule: Secondary | ICD-10-CM | POA: Insufficient documentation

## 2018-03-15 DIAGNOSIS — Z136 Encounter for screening for cardiovascular disorders: Secondary | ICD-10-CM

## 2018-03-15 DIAGNOSIS — J449 Chronic obstructive pulmonary disease, unspecified: Secondary | ICD-10-CM

## 2018-03-15 DIAGNOSIS — G7000 Myasthenia gravis without (acute) exacerbation: Secondary | ICD-10-CM

## 2018-03-15 DIAGNOSIS — I700 Atherosclerosis of aorta: Secondary | ICD-10-CM

## 2018-03-15 DIAGNOSIS — M810 Age-related osteoporosis without current pathological fracture: Secondary | ICD-10-CM

## 2018-03-15 DIAGNOSIS — D229 Melanocytic nevi, unspecified: Secondary | ICD-10-CM

## 2018-03-15 MED ORDER — PREGABALIN 75 MG PO CAPS: 75.0000 mg | ORAL_CAPSULE | Freq: Two times a day (BID) | ORAL | 0 refills | Status: DC

## 2018-03-15 NOTE — Progress Notes (Signed)
Patient: Alexandria Rodriguez, Female    DOB: [birth year], 67 y.o.   MRN: [id number] Visit Date: 03/15/2018  Today's Provider: Lelon Huh, MD   Chief Complaint  Patient presents with  . Annual Exam   Subjective:     Complete Physical Alexandria Rodriguez is a 67 y.o. female. She feels fairly well. She reports not exercising. She reports she is sleeping fairly well.  Pt states she sleeps about four hours a night secondary to back pain.States she has been out of Lyrica a few months due to cost and neuropathy is much worse. She is on gabapentin which does provide some relief. She is followed by Dr. Manuella Ghazi for essential tremors and neuropathy.   -----------------------------------------------------------  Follow up osteoporosis. She was previously on alendronate, but did not even complete one year due to upset stomach. She has repeatedly declined referral for alternative treatments. She did take vitamin d for awhile, but is no longer taking it for unclear reasons. She is scheduled for follow up BMD.   Of note is that she recently had LDCT of lungs for cancer screening and noted to have pulmonary nodules with 6 month follow up recommended and atherosclerosis of aorta.   She also states that she has been having trouble with her vision and her neurologist thought she may have cataracts and she would like referral to ophthalmology.   She has also been bothered by several moles and her sister recommended Dr. Aubery Lapping. Patient requests referral.    Review of Systems  Constitutional: Positive for fatigue. Negative for activity change, appetite change, chills, diaphoresis, fever and unexpected weight change.  HENT: Negative.   Eyes: Positive for photophobia and visual disturbance. Negative for pain, discharge, redness and itching.  Respiratory: Negative.   Cardiovascular: Negative.   Gastrointestinal: Positive for constipation. Negative for abdominal distention, abdominal pain, anal bleeding,  blood in stool, diarrhea, nausea, rectal pain and vomiting.  Endocrine: Negative.   Genitourinary: Positive for frequency. Negative for decreased urine volume, difficulty urinating, dyspareunia, dysuria, enuresis, flank pain, genital sores, hematuria, menstrual problem, pelvic pain, urgency, vaginal bleeding, vaginal discharge and vaginal pain.  Musculoskeletal: Positive for myalgias, neck pain and neck stiffness. Negative for arthralgias, back pain, gait problem and joint swelling.  Skin: Negative.   Neurological: Positive for tremors and weakness. Negative for dizziness, seizures, syncope, facial asymmetry, speech difficulty, light-headedness, numbness and headaches.  Hematological: Negative.   Psychiatric/Behavioral: Negative for agitation, behavioral problems, confusion, decreased concentration, dysphoric mood, hallucinations, self-injury, sleep disturbance and suicidal ideas. The patient is nervous/anxious. The patient is not hyperactive.     Social History   Socioeconomic History  . Marital status: Married    Spouse name: Jaquelyn Bitter  . Number of children: 3  . Years of education: Not on file  . Highest education level: Some college, no degree  Occupational History  . Occupation: disabled  Social Needs  . Financial resource strain: Somewhat hard  . Food insecurity:    Worry: Often true    Inability: Never true  . Transportation needs:    Medical: No    Non-medical: No  Tobacco Use  . Smoking status: Current Every Day Smoker    Packs/day: 1.00    Years: 42.00    Pack years: 42.00    Types: Cigarettes  . Smokeless tobacco: Never Used  . Tobacco comment:  Has been smoking for 30+ years, Cigarettes per day;10.  Substance and Sexual Activity  . Alcohol use: No  Alcohol/week: 0.0 standard drinks  . Drug use: No  . Sexual activity: Not on file  Lifestyle  . Physical activity:    Days per week: 0 days    Minutes per session: 0 min  . Stress: Patient refused  Relationships  .  Social connections:    Talks on phone: More than three times a week    Gets together: More than three times a week    Attends religious service: Never    Active member of club or organization: No    Attends meetings of clubs or organizations: Never    Relationship status: Married  . Intimate partner violence:    Fear of current or ex partner: No    Emotionally abused: No    Physically abused: No    Forced sexual activity: No  Other Topics Concern  . Not on file  Social History Narrative   Ambulates with walker rollator or using assitance holding on to walls or solid surfaces.     Past Medical History:  Diagnosis Date  . Anxiety   . Cataract   . Cerebrovascular disease   . Chronic pain    back pain  . Contact dermatitis   . COPD (chronic obstructive pulmonary disease) ([place])   . Depression   . Diverticulosis 10/31/10   On Tranverse colon  . GERD (gastroesophageal reflux disease)   . Myasthenia gravis ([place]) 02/2013   Diagnosed by doctor Manuella Ghazi  . Neuropathy   . Osteoporosis   . Parkinson's disease (tremor, stiffness, slow motion, unstable posture) ([place])   . Stroke ([place])   . Vitamin D deficiency      Patient Active Problem List   Diagnosis Date Noted  . Aortic atherosclerosis ([place]) 03/14/2018  . Essential tremor 06/25/2017  . Adenomatous polyps 07/05/2016  . Humerus fracture 04/10/2016  . AD (atopic dermatitis) 06/01/2015  . Back pain, chronic 06/01/2015  . Compression fracture 06/01/2015  . 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 Erb-Goldflam disease ([place]) 06/01/2015  . TMJ (temporomandibular joint disorder) 12/22/2014  . Diverticulosis 12/22/2014  . Depression 12/22/2014  . Cerebrovascular disease 12/22/2014  . GERD (gastroesophageal reflux disease) 12/22/2014  . Fibrocystic breast disease 12/22/2014  . Dyspnea, unspecified 12/22/2014  . Tremor 12/22/2014  . Radiculopathy of cervical region 12/22/2014  . Tobacco use 12/22/2014  . Abnormal weight loss 12/22/2014   . Diffuse cystic mastopathy of breast 12/22/2014  . Diverticulosis of intestine without perforation or abscess without bleeding 12/22/2014  . History of adenomatous polyp of colon 12/04/2014  . Osteoporosis 12/04/2014  . B12 deficiency 11/04/2014  . Polyneuropathy ([place]) 11/03/2014  . Abnormal barium swallow 10/02/2014  . Dysphagia 10/02/2014  . Anxiety 07/31/2013  . Low back pain 07/31/2013  . Vitamin D deficiency 07/31/2013  . COPD (chronic obstructive pulmonary disease) ([place]) 12/27/2012    Past Surgical History:  Procedure Laterality Date  . ABDOMINAL HYSTERECTOMY  2000   due to excessive bleeeding  . CERVICAL CONE BIOPSY    . Cervical discectomy and fusion  [facility]  . COLONOSCOPY WITH PROPOFOL N/A 12/06/2015   Procedure: COLONOSCOPY WITH PROPOFOL;  Surgeon: Manya Silvas, MD;  Location: [facility] ENDOSCOPY;  Service: Endoscopy;  Laterality: N/A;  . ESOPHAGOGASTRODUODENOSCOPY N/A 10/05/2014   Procedure: ESOPHAGOGASTRODUODENOSCOPY (EGD);  Surgeon: Manya Silvas, MD;  Location: [facility] ENDOSCOPY;  Service: Endoscopy;  Laterality: N/A;  . Repeat cuff surgery  2004   Developed adhesive capsulitis  . ROTATOR CUFF REPAIR Right 2003   Shoulder  Her family history includes Breast cancer in her maternal grandmother; Cancer in her sister; Colon cancer in her mother; Stroke in her father.      Current Outpatient Medications:  .  acetaminophen (TYLENOL) 325 MG tablet, Take 2 tablets (650 mg total) by mouth every 6 (six) hours as needed for mild pain (or Fever >/= 101)., Disp: , Rfl:  .  buPROPion (WELLBUTRIN SR) 150 MG 12 hr tablet, TAKE 1 TABLET BY MOUTH TWO  TIMES DAILY, Disp: 180 tablet, Rfl: 3 .  busPIRone (BUSPAR) 5 MG tablet, TAKE 1 TABLET BY MOUTH TWO  TIMES DAILY, Disp: , Rfl:  .  carbidopa-levodopa (SINEMET IR) 25-100 MG per tablet, Take 1 tablet by mouth 2 (two) times daily. , Disp: , Rfl:  .  carisoprodol (SOMA) 350 MG tablet, Take 1 tablet (350  mg total) by mouth 2 (two) times daily as needed., Disp: 60 tablet, Rfl: 5 .  gabapentin (NEURONTIN) 300 MG capsule, TAKE ONE CAPSULE BY MOUTH TWICE DAILY, Disp: 165 capsule, Rfl: 0 .  IBU 400 MG tablet, TAKE 1 TABLET BY MOUTH  TWICE DAILY AS NEEDED FOR  BACK PAIN, Disp: 180 tablet, Rfl: 4 .  naproxen (NAPROSYN) 500 MG tablet, Take 1 tablet (500 mg total) by mouth 2 (two) times daily with a meal., Disp: 30 tablet, Rfl: 0 .  omeprazole (PRILOSEC) 40 MG capsule, TAKE 1 CAPSULE BY MOUTH  DAILY, Disp: 90 capsule, Rfl: 1 .  primidone (MYSOLINE) 250 MG tablet, Take 250 mg by mouth 2 (two) times daily. , Disp: , Rfl:  .  triamcinolone ointment (KENALOG) 0.1 %, APPLY TO AFFECTED AREA TWICE A DAY AS NEEDED., Disp: 30 g, Rfl: 0 .  diclofenac sodium (VOLTAREN) 1 % GEL, Apply to affected area 4 times daily (Patient not taking: Reported on 03/15/2018), Disp: 100 g, Rfl: 0 .  gabapentin (NEURONTIN) 300 MG capsule, Take 1 capsule (300 mg total) by mouth 2 (two) times daily. (Patient not taking: Reported on 03/15/2018), Disp: 165 capsule, Rfl: 0 .  gabapentin (NEURONTIN) 600 MG tablet, TAKE 1 TABLET BY MOUTH  TWICE A DAY (Patient not taking: Reported on 03/15/2018), Disp: 180 tablet, Rfl: 0 .  LYRICA 75 MG capsule, TAKE ONE CAPSULE BY MOUTH TWICE DAILY (Patient not taking: Reported on 02/11/2018), Disp: 60 capsule, Rfl: 3  Patient Care Team: Birdie Sons, MD as PCP - General (Family Medicine) Vladimir Crofts, MD (Neurology) Clydell Hakim, MD as Consulting Physician (Anesthesiology)     Objective:   Vitals: BP 126/70 (BP Location: Right Arm, Patient Position: Sitting, Cuff Size: Normal)   Pulse 80   Temp 98.3 F (36.8 C) (Oral)   Resp 16   Wt 122 lb (55.3 kg)   SpO2 97%   BMI 21.96 kg/m   Physical Exam   General Appearance:    Alert, cooperative, no distress, appears stated age  Head:    Normocephalic, without obvious abnormality, atraumatic  Eyes:    PERRL, conjunctiva/corneas clear, EOM's intact,  fundi    benign, both eyes  Ears:    Normal TM's and external ear canals, both ears  Nose:   Nares normal, septum midline, mucosa normal, no drainage    or sinus tenderness  Throat:   Lips, mucosa, and tongue normal; teeth and gums normal  Neck:   Supple, symmetrical, trachea midline, no adenopathy;    thyroid:  no enlargement/tenderness/nodules; no carotid   bruit or JVD  Back:     Symmetric, no curvature, ROM normal,  no CVA tenderness  Lungs:     Clear to auscultation bilaterally, respirations unlabored  Chest Wall:    No tenderness or deformity   Heart:    Regular rate and rhythm, S1 and S2 normal, no murmur, rub   or gallop  Breast Exam:    patient declined.   Abdomen:     Soft, non-tender, bowel sounds active all four quadrants,    no masses, no organomegaly  Pelvic:    deferred and not indicated; post-menopausal, no abnormal Pap smears in past  Extremities:   Extremities normal, atraumatic, no cyanosis or edema  Pulses:   2+ and symmetric all extremities  Skin:   Skin color, texture, turgor normal, no rashes or lesions  Lymph nodes:   Cervical, supraclavicular, and axillary nodes normal  Neurologic:   CNII-XII intact, normal strength, sensation and reflexes    throughout   Activities of Daily Living In your present state of health, do you have any difficulty performing the following activities: 02/11/2018  Hearing? N  Comment declines hearing aids  Vision? Y  Comment wears eye glasses and has been [facility] to [facility] cataracts  Difficulty concentrating or making decisions? Y  Comment pt loses track of days of the week, difficulty with short term memory  Walking or climbing stairs? Y  Dressing or bathing? Y  Comment uses assistive devices to aid with bathing; pain causes difficulty  Doing errands, shopping? Y  Comment husband assists with driving and errands  Preparing Food and eating ? Y  Comment difficulty standing long enough to cook more than basic meals   Using the Toilet? N  In the past six months, have you accidently leaked urine? Y  Comment wears pads for protection due to frequent bladder control issues  Do you have problems with loss of bowel control? N  Managing your Medications? N  Managing your Finances? N  Housekeeping or managing your Housekeeping? N  Some recent data might be hidden    Fall Risk Assessment Fall Risk  02/11/2018 01/10/2017  Falls in the past year? 0 Yes  Comment near miss falls due impaired gait -  Number falls in past yr: 0 2 or more  Injury with Fall? - Yes  Risk for fall due to : Impaired mobility Impaired balance/gait;Impaired vision;Medication side effect  Risk for fall due to: Comment - neuropathy in feet and essential tremors; wears eyeglasses  Follow up Falls prevention discussed;Falls evaluation completed Education provided;Falls prevention discussed     Depression Screen PHQ 2/9 Scores 02/11/2018 01/10/2017 01/10/2017  PHQ - 2 Score 2 1 0  PHQ- 9 Score 9 14 -    6CIT Screen 02/11/2018  What Year? 0 points  What month? 0 points  What time? 0 points  Count back from 20 0 points  Months in reverse 0 points  Repeat phrase 4 points  Total Score 4      Assessment & Plan:    Annual Physical Reviewed patient's Family Medical History Reviewed and updated list of patient's medical providers Assessment of cognitive impairment was done Assessed patient's functional ability Established a written schedule for health screening [place] Completed and Reviewed  Exercise Activities and Dietary recommendations Goals    . Quit smoking / using tobacco     Recommend eliminating smoking habits.       Immunization History  Administered Date(s) Administered  . Influenza Split 02/16/2011  . Influenza, High Dose Seasonal PF 12/28/2015, 01/10/2017, 02/11/2018  .  Influenza,inj,Quad PF,6+ Mos 12/27/2012, 12/31/2013  . Pneumococcal Conjugate-13 01/10/2017  . Pneumococcal  Polysaccharide-23 02/16/2011, 02/11/2018    Health Maintenance  Topic Date Due  . DEXA SCAN  01/24/2015  . TETANUS/TDAP  02/12/2019 (Originally 11/09/1969)  . MAMMOGRAM  08/16/2018  . COLONOSCOPY  12/05/2020  . INFLUENZA VACCINE  Completed  . Hepatitis C Screening  Completed  . PNA vac Low Risk Adult  Completed     Discussed health benefits of physical activity, and encouraged her to engage in regular exercise appropriate for her age and condition.    ------------------------------------------------------------------------------------------------------------  1. Annual physical exam  - EKG 12-Lead  2. Polyneuropathy (HCC) Multifactorial, on gabapentin, but did much better when she was also taking Lyrica which she can no longer afford being in the doughnut hole. We do have a few samples to get her by for a few weeks.  - Comprehensive metabolic panel - pregabalin (LYRICA) 75 MG capsule; Take 1 capsule (75 mg total) by mouth 2 (two) times daily.  Dispense: 28 capsule; Refill: 0  3. B12 deficiency  - Vitamin B12  4. Vitamin D deficiency  - VITAMIN D 25 Hydroxy (Vit-D Deficiency, Fractures)  5. Osteoporosis, unspecified osteoporosis type, unspecified pathological fracture presence Intolerant to alendronate which she does not want to try again. Discussed alternative therapies such as Prolia. Follow up BMD has been ordered.  - VITAMIN D 25 Hydroxy (Vit-D Deficiency, Fractures)  6. Tobacco use Strongly encouraged to stop smoking .  7. Chronic obstructive pulmonary disease, unspecified COPD type ([place]) Stable on current regiment. Strongly encourage smoking cessaion.   8. lung nodule On LDCT. Cancer center to contact her in June for 6 month follow up.   9. Aortic atherosclerosis ([place]) Incidental finding on LDCT. Asymptomatic. Will consider statin therapy after reviewing labs.  - EKG 12-Lead - Lipid panel  10. Change in vision  - Ambulatory referral to Ophthalmology  11.  Numerous skin moles By patient request. - Ambulatory referral to Dermatology  12. Encounter for special screening examination for cardiovascular disorder  - Lipid panel  13. Myasthenia gravis ([place]) Stable. Continue routine follow up Dr. Manuella Ghazi.     Lelon Huh, MD  [facility]

## 2018-03-15 NOTE — Patient Instructions (Addendum)
.   PLEASE BRING ALL OF YOUR MEDICATIONs TO EVERY APPOINTMENT TO MAKE SURE OUR MEDICATION LIST IS THE SAME AS YOURS

## 2018-03-16 LAB — COMPREHENSIVE METABOLIC PANEL
ALK PHOS: 116 IU/L (ref 39–117)
ALT: 4 IU/L (ref 0–32)
AST: 13 IU/L (ref 0–40)
Albumin/Globulin Ratio: 1.5 (ref 1.2–2.2)
Albumin: 3.7 g/dL (ref 3.6–4.8)
BUN/Creatinine Ratio: 4 — ABNORMAL LOW (ref 12–28)
BUN: 3 mg/dL — ABNORMAL LOW (ref 8–27)
Bilirubin Total: 0.2 mg/dL (ref 0.0–1.2)
CO2: 22 mmol/L (ref 20–29)
Calcium: 8.3 mg/dL — ABNORMAL LOW (ref 8.7–10.3)
Chloride: 99 mmol/L (ref 96–106)
Creatinine, Ser: 0.76 mg/dL (ref 0.57–1.00)
GFR calc Af Amer: 94 mL/min/{1.73_m2} (ref >59)
GFR calc non Af Amer: 81 mL/min/{1.73_m2} (ref >59)
Globulin, Total: 2.4 g/dL (ref 1.5–4.5)
Glucose: 78 mg/dL (ref 65–99)
Potassium: 4.5 mmol/L (ref 3.5–5.2)
Sodium: 137 mmol/L (ref 134–144)
Total Protein: 6.1 g/dL (ref 6.0–8.5)

## 2018-03-16 LAB — VITAMIN D 25 HYDROXY (VIT D DEFICIENCY, FRACTURES): Vit D, 25-Hydroxy: 5 ng/mL — ABNORMAL LOW (ref 30.0–100.0)

## 2018-03-16 LAB — LIPID PANEL
CHOLESTEROL TOTAL: 172 mg/dL (ref 100–199)
Chol/HDL Ratio: 3.4 ratio (ref 0.0–4.4)
HDL: 51 mg/dL (ref >39)
LDL CALC: 98 mg/dL (ref 0–99)
Triglycerides: 115 mg/dL (ref 0–149)
VLDL CHOLESTEROL CAL: 23 mg/dL (ref 5–40)

## 2018-03-16 LAB — VITAMIN B12: VITAMIN B 12: 96 pg/mL — AB (ref 232–1245)

## 2018-03-18 ENCOUNTER — Telehealth: Payer: Self-pay

## 2018-03-18 NOTE — Telephone Encounter (Signed)
Tried calling; no answer 03/18/2018  Thanks,   -Mickel Baas

## 2018-03-18 NOTE — Telephone Encounter (Signed)
Patient advised of results and verbally voiced understanding. Three month follow up appointment scheduled 06/17/2017 at 1:40pm. Appointment for first  B12 injection scheduled tomorrow 03/19/2018 at 11:10am.

## 2018-03-18 NOTE — Telephone Encounter (Signed)
-----   Message from Birdie Sons, MD sent at 03/17/2018  8:27 PM EST ----- Vitamin B12 and vitamin d levels are very low.  Need to start OTC vitamin D3 5,000 units once a day.  Need to start vitamin b12 injection, 1,000 mg once a week for 4 weeks, then 1,000 mg once a month Need to schedule follow up o.v. 3 months.

## 2018-03-19 ENCOUNTER — Ambulatory Visit (INDEPENDENT_AMBULATORY_CARE_PROVIDER_SITE_OTHER): Payer: Medicare Other

## 2018-03-19 DIAGNOSIS — E538 Deficiency of other specified B group vitamins: Secondary | ICD-10-CM | POA: Diagnosis not present

## 2018-03-19 MED ORDER — CYANOCOBALAMIN 1000 MCG/ML IJ SOLN
1000.0000 ug | Freq: Once | INTRAMUSCULAR | Status: AC
  Administered 2018-03-19: 1000 ug via INTRAMUSCULAR

## 2018-03-19 NOTE — Progress Notes (Signed)
Here for start of b12 injection.

## 2018-03-26 ENCOUNTER — Ambulatory Visit (INDEPENDENT_AMBULATORY_CARE_PROVIDER_SITE_OTHER): Payer: Medicare Other

## 2018-03-26 ENCOUNTER — Other Ambulatory Visit: Payer: Medicare Other

## 2018-03-26 VITALS — Temp 98.0°F

## 2018-03-26 DIAGNOSIS — E538 Deficiency of other specified B group vitamins: Secondary | ICD-10-CM

## 2018-03-26 MED ORDER — CYANOCOBALAMIN 1000 MCG/ML IJ SOLN
1000.0000 ug | Freq: Once | INTRAMUSCULAR | Status: AC
  Administered 2018-03-26: 1000 ug via INTRAMUSCULAR

## 2018-04-01 ENCOUNTER — Other Ambulatory Visit: Payer: Self-pay | Admitting: Family Medicine

## 2018-04-02 ENCOUNTER — Ambulatory Visit: Payer: Self-pay

## 2018-04-08 ENCOUNTER — Other Ambulatory Visit: Payer: Self-pay | Admitting: Podiatry

## 2018-04-09 ENCOUNTER — Telehealth: Payer: Self-pay | Admitting: Family Medicine

## 2018-04-09 ENCOUNTER — Ambulatory Visit (INDEPENDENT_AMBULATORY_CARE_PROVIDER_SITE_OTHER): Payer: Medicare Other

## 2018-04-09 DIAGNOSIS — E538 Deficiency of other specified B group vitamins: Secondary | ICD-10-CM

## 2018-04-09 MED ORDER — CYANOCOBALAMIN 1000 MCG/ML IJ SOLN
1000.0000 ug | Freq: Once | INTRAMUSCULAR | Status: AC
  Administered 2018-04-09: 1000 ug via INTRAMUSCULAR

## 2018-04-09 NOTE — Telephone Encounter (Signed)
Form placed on Dr. Maralyn Sago desk for review/ completion.

## 2018-04-09 NOTE — Telephone Encounter (Signed)
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

## 2018-05-02 DIAGNOSIS — H2513 Age-related nuclear cataract, bilateral: Secondary | ICD-10-CM | POA: Diagnosis not present

## 2018-05-07 ENCOUNTER — Ambulatory Visit (INDEPENDENT_AMBULATORY_CARE_PROVIDER_SITE_OTHER): Payer: Medicare Other | Admitting: *Deleted

## 2018-05-07 DIAGNOSIS — E538 Deficiency of other specified B group vitamins: Secondary | ICD-10-CM | POA: Diagnosis not present

## 2018-05-07 MED ORDER — CYANOCOBALAMIN 1000 MCG/ML IJ SOLN
1000.0000 ug | Freq: Once | INTRAMUSCULAR | Status: AC
  Administered 2018-05-07: 1000 ug via INTRAMUSCULAR

## 2018-05-14 DIAGNOSIS — L918 Other hypertrophic disorders of the skin: Secondary | ICD-10-CM | POA: Diagnosis not present

## 2018-05-14 DIAGNOSIS — Z1283 Encounter for screening for malignant neoplasm of skin: Secondary | ICD-10-CM | POA: Diagnosis not present

## 2018-05-14 DIAGNOSIS — L821 Other seborrheic keratosis: Secondary | ICD-10-CM | POA: Diagnosis not present

## 2018-05-14 DIAGNOSIS — L400 Psoriasis vulgaris: Secondary | ICD-10-CM | POA: Diagnosis not present

## 2018-05-14 DIAGNOSIS — L738 Other specified follicular disorders: Secondary | ICD-10-CM | POA: Diagnosis not present

## 2018-05-15 ENCOUNTER — Other Ambulatory Visit: Payer: Medicare Other

## 2018-05-16 ENCOUNTER — Other Ambulatory Visit: Payer: Self-pay | Admitting: Family Medicine

## 2018-05-16 ENCOUNTER — Other Ambulatory Visit: Payer: Self-pay | Admitting: Podiatry

## 2018-06-04 ENCOUNTER — Ambulatory Visit: Payer: Self-pay

## 2018-06-18 ENCOUNTER — Other Ambulatory Visit: Payer: Self-pay | Admitting: Family Medicine

## 2018-06-18 ENCOUNTER — Encounter: Payer: Self-pay | Admitting: Family Medicine

## 2018-06-18 ENCOUNTER — Ambulatory Visit (INDEPENDENT_AMBULATORY_CARE_PROVIDER_SITE_OTHER): Payer: Medicare Other | Admitting: Family Medicine

## 2018-06-18 VITALS — BP 100/52 | HR 84 | Temp 98.9°F | Resp 16 | Wt 123.0 lb

## 2018-06-18 DIAGNOSIS — G629 Polyneuropathy, unspecified: Secondary | ICD-10-CM

## 2018-06-18 DIAGNOSIS — D519 Vitamin B12 deficiency anemia, unspecified: Secondary | ICD-10-CM

## 2018-06-18 DIAGNOSIS — Z720 Tobacco use: Secondary | ICD-10-CM

## 2018-06-18 DIAGNOSIS — E538 Deficiency of other specified B group vitamins: Secondary | ICD-10-CM | POA: Diagnosis not present

## 2018-06-18 DIAGNOSIS — E559 Vitamin D deficiency, unspecified: Secondary | ICD-10-CM | POA: Diagnosis not present

## 2018-06-18 DIAGNOSIS — Z1159 Encounter for screening for other viral diseases: Secondary | ICD-10-CM | POA: Diagnosis not present

## 2018-06-18 MED ORDER — CYANOCOBALAMIN 1000 MCG/ML IJ SOLN
1000.0000 ug | Freq: Once | INTRAMUSCULAR | Status: AC
  Administered 2018-06-18: 1000 ug via INTRAMUSCULAR

## 2018-06-18 NOTE — Progress Notes (Signed)
Patient: Alexandria Rodriguez Female    DOB: 05-06-1950   68 y.o.   MRN: [id number] Visit Date: 06/18/2018  Today's Provider: Lelon Huh, MD   Chief Complaint  Patient presents with  . Follow-up   Subjective:     HPI  Follow up for COPD:  The patient was last seen for this 3 months ago. Changes made at last visit include none. Patient was encouraged to quit smoking.  She reports good compliance with treatment. She feels that condition is Unchanged. She is not having side effects.   ------------------------------------------------------------------------------------  Follow up for Vitamin B 12 deficiency:  The patient was last seen for this 3 months ago. Changes made at last visit include starting Vitamin B12 injections every week for 4 weeks, then one every month.  She reports good compliance with treatment. She feels that condition is Unchanged. She is not having side effects.   Lab Results  Component Value Date   VITAMINB12 96 (L) 03/15/2018     ------------------------------------------------------------------------------------  Follow up for Vitamin D Deficiency:  The patient was last seen for this 3 months ago. Changes made at last visit include starting OTC Vitamin D3 5,000 units daily.  She reports good compliance with treatment. She feels that condition is Unchanged. She is not having side effects.   Lab Results  Component Value Date   VD25OH 5.0 (L) 03/15/2018    ------------------------------------------------------------------------------------  Follow up for Polyneuropathy:  The patient was last seen for this 3 months ago. Changes made at last visit include none. Patient was given samples of Lyrica.   She reports good compliance with treatment. She feels that condition is Unchanged. Patient is out of Lyrica, but doesn't think it helped. She is still taking gabapentin.  She is not having side effects.    ------------------------------------------------------------------------------------  Allergies  Allergen Reactions  . Effexor Xr [Venlafaxine Hcl Er] Other (See Comments)    Reaction:  GI upset   . Fosamax [Alendronate Sodium] Nausea And Vomiting  . Percocet [Oxycodone-Acetaminophen] Swelling  . Erythromycin Swelling and Rash    Pt states that her tongue swells.    Katrinka Blazing [Lidocaine] Swelling and Rash  . Morphine And Related Swelling, Rash and Other (See Comments)    Pt states that her tongue swells.   Pt states that her tongue swells.    . Sulfa Antibiotics Swelling and Rash    TONGUE SWELLING  . Sulfasalazine Rash and Swelling  . Tramadol Swelling and Rash     Current Outpatient Medications:  .  acetaminophen (TYLENOL) 325 MG tablet, Take 2 tablets (650 mg total) by mouth every 6 (six) hours as needed for mild pain (or Fever >/= 101)., Disp: , Rfl:  .  buPROPion (WELLBUTRIN SR) 150 MG 12 hr tablet, TAKE 1 TABLET BY MOUTH TWO  TIMES DAILY, Disp: 180 tablet, Rfl: 3 .  busPIRone (BUSPAR) 5 MG tablet, TAKE 1 TABLET BY MOUTH TWO  TIMES DAILY, Disp: , Rfl:  .  carbidopa-levodopa (SINEMET IR) 25-100 MG per tablet, Take 1 tablet by mouth 2 (two) times daily. , Disp: , Rfl:  .  carisoprodol (SOMA) 350 MG tablet, TAKE 1 TABLET BY MOUTH TWO  TIMES DAILY AS NEEDED, Disp: 60 tablet, Rfl: 5 .  diclofenac sodium (VOLTAREN) 1 % GEL, Apply to affected area 4 times daily (Patient not taking: Reported on 03/15/2018), Disp: 100 g, Rfl: 0 .  gabapentin (NEURONTIN) 300 MG capsule, TAKE ONE CAPSULE BY MOUTH TWICE DAILY, Disp:  165 capsule, Rfl: 0 .  gabapentin (NEURONTIN) 600 MG tablet, TAKE 1 TABLET BY MOUTH  TWICE A DAY, Disp: 90 tablet, Rfl: 0 .  IBU 400 MG tablet, TAKE 1 TABLET BY MOUTH  TWICE DAILY AS NEEDED FOR  BACK PAIN, Disp: 180 tablet, Rfl: 4 .  naproxen (NAPROSYN) 500 MG tablet, Take 1 tablet (500 mg total) by mouth 2 (two) times daily with a meal., Disp: 30 tablet, Rfl: 0 .  omeprazole  (PRILOSEC) 40 MG capsule, TAKE 1 CAPSULE BY MOUTH  DAILY, Disp: 90 capsule, Rfl: 1 .  pregabalin (LYRICA) 75 MG capsule, Take 1 capsule (75 mg total) by mouth 2 (two) times daily., Disp: 28 capsule, Rfl: 0 .  primidone (MYSOLINE) 250 MG tablet, Take 250 mg by mouth 2 (two) times daily. , Disp: , Rfl:  .  triamcinolone ointment (KENALOG) 0.1 %, APPLY EXTERNALLY TO THE AFFECTED AREA TWICE DAILY AS NEEDED, Disp: 30 g, Rfl: 3  Review of Systems  Constitutional: Negative for appetite change, chills, fatigue and fever.  Respiratory: Negative for chest tightness and shortness of breath.   Cardiovascular: Negative for chest pain and palpitations.  Gastrointestinal: Negative for abdominal pain, nausea and vomiting.  Neurological: Negative for dizziness and weakness.    Social History   Tobacco Use  . Smoking status: Current Every Day Smoker    Packs/day: 1.00    Years: 42.00    Pack years: 42.00    Types: Cigarettes  . Smokeless tobacco: Never Used  . Tobacco comment:  Has been smoking for 30+ years, Cigarettes per day;10.  Substance Use Topics  . Alcohol use: No    Alcohol/week: 0.0 standard drinks      Objective:   BP (!) 100/52 (BP Location: Left Arm, Patient Position: Sitting, Cuff Size: Normal)   Pulse 84   Temp 98.9 F (37.2 C) (Oral)   Resp 16   Wt 123 lb (55.8 kg)   SpO2 97% Comment: room air  BMI 22.14 kg/m     Physical Exam   General Appearance:    Alert, cooperative, no distress  Eyes:    PERRL, conjunctiva/corneas clear, EOM's intact       Lungs:     Clear to auscultation bilaterally, respirations unlabored  Heart:    Regular rate and rhythm  Neurologic:   Awake, alert, oriented x 3. Generalized resting tremors.           Assessment & Plan    1. Tobacco use Stop smoking.   2. Vitamin D deficiency Compliant with daily vitamin d supplements.  - VITAMIN D 25 Hydroxy (Vit-D Deficiency, Fractures)  3. B12 deficiency On b12 injections, but last dose was 2  months ago. B12 given after labs drawn today.  - Vitamin B12 - CBC - cyanocobalamin ((VITAMIN B-12)) injection 1,000 mcg  4. Polyneuropathy (HCC) Continue gabapentin  5. Anemia due to vitamin B12 deficiency, unspecified B12 deficiency type  - CBC  6. Need for hepatitis C screening test  - Hepatitis C antibody     Lelon Huh, MD  [facility]

## 2018-06-18 NOTE — Patient Instructions (Signed)
.   Please review the attached list of medications and notify my office if there are any errors.   . Please bring all of your medications to every appointment so we can make sure that our medication list is the same as yours.    Please stop smoking  . Please go to the lab draw station in [address] on the second floor of [facility]. Normal hours are 8:00am to 12:30pm and 1:30pm to 4:00pm Monday through Friday

## 2018-06-19 ENCOUNTER — Telehealth: Payer: Self-pay

## 2018-06-19 LAB — CBC
Hematocrit: 35.3 % (ref 34.0–46.6)
Hemoglobin: 12.6 g/dL (ref 11.1–15.9)
MCH: 35.9 pg — ABNORMAL HIGH (ref 26.6–33.0)
MCHC: 35.7 g/dL (ref 31.5–35.7)
MCV: 101 fL — ABNORMAL HIGH (ref 79–97)
Platelets: 282 10*3/uL (ref 150–450)
RBC: 3.51 x10E6/uL — ABNORMAL LOW (ref 3.77–5.28)
RDW: 12.4 % (ref 11.7–15.4)
WBC: 5.8 10*3/uL (ref 3.4–10.8)

## 2018-06-19 LAB — HEPATITIS C ANTIBODY

## 2018-06-19 LAB — VITAMIN B12: Vitamin B-12: >2000 pg/mL — ABNORMAL HIGH (ref 232–1245)

## 2018-06-19 LAB — VITAMIN D 25 HYDROXY (VIT D DEFICIENCY, FRACTURES): Vit D, 25-Hydroxy: 4.1 ng/mL — ABNORMAL LOW (ref 30.0–100.0)

## 2018-06-19 MED ORDER — VITAMIN D (ERGOCALCIFEROL) 1.25 MG (50000 UNIT) PO CAPS: 50000.0000 [IU] | ORAL_CAPSULE | ORAL | 4 refills | Status: DC

## 2018-06-19 NOTE — Telephone Encounter (Signed)
-----   Message from Birdie Sons, MD sent at 06/19/2018  8:00 AM EDT ----- Vitamin d levels are still VERY low. Need to start prescription vitamin d 50,000 units once a week. Please send in prescription for #12 rf x 4. B12 levels are much better. Can either continue monthly injections, or change to OTC vitamin B12 1000 mg every day.  Hepatitis C test is negative Needs to schedule follow up o.v. and labs in 3 months.

## 2018-06-19 NOTE — Telephone Encounter (Signed)
Patient has been advised and appt scheduled for 09/23/18. KW

## 2018-06-20 ENCOUNTER — Other Ambulatory Visit: Payer: Medicare Other

## 2018-06-25 DIAGNOSIS — L400 Psoriasis vulgaris: Secondary | ICD-10-CM | POA: Diagnosis not present

## 2018-06-25 DIAGNOSIS — L281 Prurigo nodularis: Secondary | ICD-10-CM | POA: Diagnosis not present

## 2018-06-25 DIAGNOSIS — Z79899 Other long term (current) drug therapy: Secondary | ICD-10-CM | POA: Diagnosis not present

## 2018-07-01 ENCOUNTER — Other Ambulatory Visit: Payer: Self-pay | Admitting: Podiatry

## 2018-07-12 ENCOUNTER — Other Ambulatory Visit: Payer: Self-pay | Admitting: Podiatry

## 2018-08-20 ENCOUNTER — Other Ambulatory Visit: Payer: Self-pay | Admitting: Family Medicine

## 2018-08-29 ENCOUNTER — Emergency Department: Payer: Medicare Other

## 2018-08-29 ENCOUNTER — Other Ambulatory Visit: Payer: Self-pay

## 2018-08-29 ENCOUNTER — Encounter: Payer: Self-pay | Admitting: Emergency Medicine

## 2018-08-29 ENCOUNTER — Emergency Department
Admission: EM | Admit: 2018-08-29 | Discharge: 2018-08-29 | Disposition: A | Discharge status: Home / Self Care | Payer: Medicare Other | Attending: Emergency Medicine | Admitting: Emergency Medicine

## 2018-08-29 DIAGNOSIS — I2510 Atherosclerotic heart disease of native coronary artery without angina pectoris: Secondary | ICD-10-CM | POA: Insufficient documentation

## 2018-08-29 DIAGNOSIS — Z8673 Personal history of transient ischemic attack (TIA), and cerebral infarction without residual deficits: Secondary | ICD-10-CM | POA: Diagnosis not present

## 2018-08-29 DIAGNOSIS — G20 Parkinson's disease: Secondary | ICD-10-CM | POA: Diagnosis not present

## 2018-08-29 DIAGNOSIS — F329 Major depressive disorder, single episode, unspecified: Secondary | ICD-10-CM | POA: Insufficient documentation

## 2018-08-29 DIAGNOSIS — F419 Anxiety disorder, unspecified: Secondary | ICD-10-CM | POA: Diagnosis not present

## 2018-08-29 DIAGNOSIS — J449 Chronic obstructive pulmonary disease, unspecified: Secondary | ICD-10-CM | POA: Diagnosis not present

## 2018-08-29 DIAGNOSIS — Z79899 Other long term (current) drug therapy: Secondary | ICD-10-CM | POA: Insufficient documentation

## 2018-08-29 DIAGNOSIS — F17210 Nicotine dependence, cigarettes, uncomplicated: Secondary | ICD-10-CM | POA: Diagnosis not present

## 2018-08-29 DIAGNOSIS — R079 Chest pain, unspecified: Secondary | ICD-10-CM | POA: Diagnosis not present

## 2018-08-29 LAB — COMPREHENSIVE METABOLIC PANEL
ALT: 9 U/L (ref 0–44)
AST: 12 U/L — ABNORMAL LOW (ref 15–41)
Albumin: 3.6 g/dL (ref 3.5–5.0)
Alkaline Phosphatase: 117 U/L (ref 38–126)
Anion gap: 7 (ref 5–15)
BUN: 6 mg/dL — ABNORMAL LOW (ref 8–23)
CO2: 25 mmol/L (ref 22–32)
Calcium: 8.4 mg/dL — ABNORMAL LOW (ref 8.9–10.3)
Chloride: 106 mmol/L (ref 98–111)
Creatinine, Ser: 0.74 mg/dL (ref 0.44–1.00)
GFR calc Af Amer: >60 mL/min (ref >60)
GFR calc non Af Amer: >60 mL/min (ref >60)
Glucose, Bld: 88 mg/dL (ref 70–99)
Potassium: 4.3 mmol/L (ref 3.5–5.1)
Sodium: 138 mmol/L (ref 135–145)
Total Bilirubin: 0.4 mg/dL (ref 0.3–1.2)
Total Protein: 6.8 g/dL (ref 6.5–8.1)

## 2018-08-29 LAB — CBC WITH DIFFERENTIAL/PLATELET
Abs Immature Granulocytes: 0.02 10*3/uL (ref 0.00–0.07)
Basophils Absolute: 0.1 10*3/uL (ref 0.0–0.1)
Basophils Relative: 1 %
Eosinophils Absolute: 0.4 10*3/uL (ref 0.0–0.5)
Eosinophils Relative: 7 %
HCT: 37.6 % (ref 36.0–46.0)
Hemoglobin: 12.5 g/dL (ref 12.0–15.0)
Immature Granulocytes: 0 %
Lymphocytes Relative: 28 %
Lymphs Abs: 1.5 10*3/uL (ref 0.7–4.0)
MCH: 35.1 pg — ABNORMAL HIGH (ref 26.0–34.0)
MCHC: 33.2 g/dL (ref 30.0–36.0)
MCV: 105.6 fL — ABNORMAL HIGH (ref 80.0–100.0)
Monocytes Absolute: 0.5 10*3/uL (ref 0.1–1.0)
Monocytes Relative: 9 %
Neutro Abs: 2.8 10*3/uL (ref 1.7–7.7)
Neutrophils Relative %: 55 %
Platelets: 252 10*3/uL (ref 150–400)
RBC: 3.56 MIL/uL — ABNORMAL LOW (ref 3.87–5.11)
RDW: 13.9 % (ref 11.5–15.5)
WBC: 5.2 10*3/uL (ref 4.0–10.5)
nRBC: 0 % (ref 0.0–0.2)

## 2018-08-29 LAB — TROPONIN I: Troponin I: <0.03 ng/mL (ref <0.03)

## 2018-08-29 MED ORDER — ONDANSETRON HCL 4 MG/2ML IJ SOLN
4.0000 mg | Freq: Once | INTRAMUSCULAR | Status: AC
  Administered 2018-08-29: 17:00:00 4 mg via INTRAVENOUS
  Filled 2018-08-29: qty 2

## 2018-08-29 NOTE — ED Provider Notes (Signed)
Alexandria Rodriguez Provider Note   ____________________________________________   First MD Initiated Contact with Patient 08/29/18 1508     (approximate)  I have reviewed the triage vital signs and the nursing notes.   HISTORY  Chief Complaint Chest Pain    HPI Alexandria Rodriguez is a 68 y.o. female who reports onset of squeezing crampy chest pain in the left chest yesterday.  It continues today.  It comes lasts maybe 30 seconds and goes away comes back anywhere from 10 minutes to half an hour later.  Nothing she does brings it on or makes it better.  It does not radiate and is not associated with shortness of breath or sweating.  She is nauseated off and on but the nausea does not seem to correlate with chest pain.  Patient says she is never had this kind of pain before.  She is a smoker.  Pain is moderate in intensity         Past Medical History:  Diagnosis Date  . Anxiety   . Cataract   . Cerebrovascular disease   . Chronic pain    back pain  . Contact dermatitis   . COPD (chronic obstructive pulmonary disease) ([place])   . Depression   . Diverticulosis 10/31/10   On Tranverse colon  . GERD (gastroesophageal reflux disease)   . Myasthenia gravis ([place]) 02/2013   Diagnosed by doctor Manuella Ghazi  . Neuropathy   . Osteoporosis   . Parkinson's disease (tremor, stiffness, slow motion, unstable posture) ([place])   . Stroke ([place])   . Vitamin D deficiency     Patient Active Problem List   Diagnosis Date Noted  . B12 deficiency anemia 06/18/2018  . Incidental lung nodule 03/15/2018  . Aortic atherosclerosis ([place]) 03/14/2018  . Essential tremor 06/25/2017  . Humerus fracture 04/10/2016  . AD (atopic dermatitis) 06/01/2015  . Back pain, chronic 06/01/2015  . Compression fracture 06/01/2015  . 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 Myasthenia gravis ([place]) 06/01/2015  . TMJ (temporomandibular joint disorder) 12/22/2014  . Diverticulosis  12/22/2014  . Depression 12/22/2014  . GERD (gastroesophageal reflux disease) 12/22/2014  . Fibrocystic breast disease 12/22/2014  . Dyspnea, unspecified 12/22/2014  . Tremor 12/22/2014  . Radiculopathy of cervical region 12/22/2014  . Tobacco use 12/22/2014  . Diffuse cystic mastopathy of breast 12/22/2014  . Diverticulosis of intestine without perforation or abscess without bleeding 12/22/2014  . History of adenomatous polyp of colon 12/04/2014  . Osteoporosis 12/04/2014  . B12 deficiency 11/04/2014  . Polyneuropathy ([place]) 11/03/2014  . Abnormal barium swallow 10/02/2014  . Dysphagia 10/02/2014  . Anxiety 07/31/2013  . Low back pain 07/31/2013  . Vitamin D deficiency 07/31/2013  . COPD (chronic obstructive pulmonary disease) ([place]) 12/27/2012    Past Surgical History:  Procedure Laterality Date  . ABDOMINAL HYSTERECTOMY  2000   due to excessive bleeeding  . CERVICAL CONE BIOPSY    . Cervical discectomy and fusion  [facility]  . COLONOSCOPY WITH PROPOFOL N/A 12/06/2015   Procedure: COLONOSCOPY WITH PROPOFOL;  Surgeon: Manya Silvas, MD;  Location: [facility] ENDOSCOPY;  Service: Endoscopy;  Laterality: N/A;  . ESOPHAGOGASTRODUODENOSCOPY N/A 10/05/2014   Procedure: ESOPHAGOGASTRODUODENOSCOPY (EGD);  Surgeon: Manya Silvas, MD;  Location: [facility] ENDOSCOPY;  Service: Endoscopy;  Laterality: N/A;  . Repeat cuff surgery  2004   Developed adhesive capsulitis  . ROTATOR CUFF REPAIR Right 2003   Shoulder    Prior to Admission medications  Medication Sig Start Date End Date Taking? Authorizing Provider  acetaminophen (TYLENOL) 325 MG tablet Take 2 tablets (650 mg total) by mouth every 6 (six) hours as needed for mild pain (or Fever >/= 101). 04/12/16   Gouru, Illene Silver, MD  buPROPion (WELLBUTRIN SR) 150 MG 12 hr tablet TAKE 1 TABLET BY MOUTH TWO  TIMES DAILY 01/18/18   Birdie Sons, MD  busPIRone (BUSPAR) 5 MG tablet TAKE 1 TABLET BY MOUTH TWO  TIMES DAILY  11/14/17   [provider]  carbidopa-levodopa (SINEMET IR) 25-100 MG per tablet Take 1 tablet by mouth 2 (two) times daily.     [provider]  carisoprodol (SOMA) 350 MG tablet TAKE 1 TABLET BY MOUTH TWO  TIMES DAILY AS NEEDED 05/16/18   Birdie Sons, MD  Cholecalciferol 125 MCG (5000 UT) TABS Take 1 tablet by mouth daily.    [provider]  diclofenac sodium (VOLTAREN) 1 % GEL Apply to affected area 4 times daily 01/15/17   Birdie Sons, MD  gabapentin (NEURONTIN) 300 MG capsule TAKE 1 CAPSULE BY MOUTH TWICE DAILY 07/01/18   Hyatt, Max T, DPM  gabapentin (NEURONTIN) 600 MG tablet TAKE 1 TABLET BY MOUTH  TWICE A DAY 07/12/18   Hyatt, Max T, DPM  IBU 400 MG tablet TAKE 1 TABLET BY MOUTH  TWICE DAILY AS NEEDED FOR  BACK PAIN 01/01/17   Birdie Sons, MD  naproxen (NAPROSYN) 500 MG tablet Take 1 tablet (500 mg total) by mouth 2 (two) times daily with a meal. 01/12/17   Birdie Sons, MD  omeprazole (PRILOSEC) 40 MG capsule TAKE 1 CAPSULE BY MOUTH  DAILY 08/20/18   Birdie Sons, MD  primidone (MYSOLINE) 250 MG tablet Take 250 mg by mouth 2 (two) times daily.     [provider]  triamcinolone ointment (KENALOG) 0.1 % APPLY EXTERNALLY TO THE AFFECTED AREA TWICE DAILY AS NEEDED 04/01/18   Birdie Sons, MD  Vitamin D, Ergocalciferol, (DRISDOL) 1.25 MG (50000 UT) CAPS capsule Take 1 capsule (50,000 Units total) by mouth every 7 (seven) days. 06/19/18   Birdie Sons, MD    Allergies Effexor xr [venlafaxine hcl er]; Fosamax [alendronate sodium]; Percocet [oxycodone-acetaminophen]; Erythromycin; Lidoderm [lidocaine]; Morphine and related; Sulfa antibiotics; Sulfasalazine; and Tramadol  Family History  Problem Relation Age of Onset  . Colon cancer Mother   . Stroke Father   . Cancer Sister        Ovarian, stable  . Breast cancer Maternal Grandmother     Social History Social History   Tobacco Use  . Smoking status: Current Every Day Smoker     Packs/day: 0.75    Years: 42.00    Pack years: 31.50    Types: Cigarettes  . Smokeless tobacco: Never Used  . Tobacco comment:  Has been smoking for 30+ years, Cigarettes per day;10.  Substance Use Topics  . Alcohol use: No    Alcohol/week: 0.0 standard drinks  . Drug use: No    Review of Systems  Constitutional: No fever/chills Eyes: No visual changes. ENT: No sore throat. Cardiovascular: See HPI Respiratory: Denies shortness of breath. Gastrointestinal: No abdominal pain.  No nausea, no vomiting.  No diarrhea.  No constipation. Genitourinary: Negative for dysuria. Musculoskeletal: Negative for back pain. Skin: Negative for rash. Neurological: Negative for headaches, focal weakness   ____________________________________________   PHYSICAL EXAM:  VITAL SIGNS: ED Triage Vitals  Enc Vitals Group     BP  Pulse      Resp      Temp      Temp src      SpO2      Weight      Height      Head Circumference      Peak Flow      Pain Score      Pain Loc      Pain Edu?      Excl. in [place]?     Constitutional: Alert and oriented. Well appearing and in no acute distress. Eyes: Conjunctivae are normal.  Head: Atraumatic. Nose: No congestion/rhinnorhea. Mouth/Throat: Mucous membranes are moist.  Oropharynx non-erythematous. Neck: No stridor.  Cardiovascular: Normal rate, regular rhythm. Grossly normal heart sounds.   Respiratory: Normal respiratory effort.  No retractions. Lungs CTAB. Gastrointestinal: Soft and nontender. No distention. No abdominal bruits.  Musculoskeletal: No lower extremity tenderness nor edema.  Patient reports foot numbness from peripheral neuropathy she also has tingling with this. Neurologic:  Normal speech and language. No new gross focal neurologic deficits are appreciated. Skin:  Skin is warm, dry and intact. No rash noted. Psychiatric: Mood and affect are normal. Speech and behavior are normal.  ____________________________________________    LABS (all labs ordered are listed, but only abnormal results are displayed)  Labs Reviewed  COMPREHENSIVE METABOLIC PANEL - Abnormal; Notable for the following components:      Result Value   BUN 6 (*)    Calcium 8.4 (*)    AST 12 (*)    All other components within normal limits  CBC WITH DIFFERENTIAL/PLATELET - Abnormal; Notable for the following components:   RBC 3.56 (*)    MCV 105.6 (*)    MCH 35.1 (*)    All other components within normal limits  TROPONIN I   ____________________________________________  EKG  KG read interpreted by me shows normal sinus rhythm rate of 69 normal axis there are very large R waves in V1 and V2 no sign of any ST-T wave changes however ____________________________________________  RADIOLOGY  ED MD interpretation: Chest x-ray read by me does not appear to show any acute changes will wait for the official reading. Radiology reads film and agrees with my reading.  Also there are signs of COPD. Official radiology report(s): Dg Chest Portable 1 View  Result Date: 08/29/2018 CLINICAL DATA:  Chest pain EXAM: PORTABLE CHEST 1 VIEW COMPARISON:  CT chest dated 03/12/2018 FINDINGS: The lungs are hyperexpanded. The cardiac silhouette is stable. There is no pneumothorax. No large pleural effusion. There is no acute osseous abnormality. Chronic changes are noted of both glenohumeral joints. Pleuroparenchymal scarring is noted at the lung apices, right worse than left. IMPRESSION: 1. No acute cardiopulmonary process. 2. COPD. Electronically Signed   By: Constance Holster M.D.   On: 08/29/2018 15:30    ____________________________________________   PROCEDURES  Procedure(s) performed (including Critical Care):  Procedures   ____________________________________________   INITIAL IMPRESSION / ASSESSMENT AND PLAN / ED COURSE  Patient has had chest discomfort for over 12 hours.  No need to do troponins. Alexandria Rodriguez was evaluated in Emergency  Rodriguez on 08/29/2018 for the symptoms described in the history of present illness. She was evaluated in the context of the global COVID-19 pandemic, which necessitated consideration that the patient might be at risk for infection with the SARS-CoV-2 virus that causes COVID-19. Institutional protocols and algorithms that pertain to the evaluation of patients at risk for COVID-19 are in a state of rapid change  based on information released by regulatory bodies including the CDC and federal and state organizations. These policies and algorithms were followed during the patient's care in the ED.              ____________________________________________   FINAL CLINICAL IMPRESSION(S) / ED DIAGNOSES  Final diagnoses:  Nonspecific chest pain     ED Discharge Orders    None       Note:  This document was prepared using Dragon voice recognition software and may include unintentional dictation errors.    Nena Polio, MD 08/29/18 [phone]

## 2018-08-29 NOTE — ED Notes (Signed)
This RN spoke with Rulon Sera [phone] to provide pt's status update. This Rn obtain verbal consent from pt to disclose information regarding her care to her son.

## 2018-08-29 NOTE — Discharge Instructions (Addendum)
Test that we have done today are all negative.  The chest pain does not sound typical for heart problems but just to be safe I will have you follow-up with cardiology.  Please call Dr.Nahser's office in the morning let them know you are in the emergency room with chest pain that should be out of see very quickly.  If you have any further problems or anything gets worse please return.

## 2018-08-29 NOTE — ED Notes (Signed)
Pt c/o CP 4/10. Pt denies nauseous.

## 2018-08-29 NOTE — ED Notes (Signed)
Signature pad not working; pt attempted to sign.

## 2018-08-29 NOTE — ED Triage Notes (Signed)
Pt ems from home for chest pain that started last night. Pain describe as squeezing, intermittent, 5/10, never had before.

## 2018-09-06 ENCOUNTER — Telehealth: Payer: Self-pay | Admitting: *Deleted

## 2018-09-06 NOTE — Telephone Encounter (Signed)
Message left in attempt to schedule lcs nodule f/u ct

## 2018-09-19 ENCOUNTER — Other Ambulatory Visit: Payer: Self-pay | Admitting: Podiatry

## 2018-09-20 ENCOUNTER — Other Ambulatory Visit: Payer: Self-pay | Admitting: Podiatry

## 2018-09-20 NOTE — Telephone Encounter (Signed)
Patient needs appt.  Last office visit 10/25/16

## 2018-09-23 ENCOUNTER — Ambulatory Visit: Payer: Self-pay | Admitting: Family Medicine

## 2018-10-15 ENCOUNTER — Other Ambulatory Visit: Payer: Self-pay | Admitting: Podiatry

## 2018-10-15 ENCOUNTER — Ambulatory Visit: Payer: Self-pay | Admitting: Family Medicine

## 2018-10-15 NOTE — Telephone Encounter (Signed)
Dr. March Rummage as Dr. On Call please advice

## 2018-10-16 ENCOUNTER — Telehealth: Payer: Self-pay | Admitting: *Deleted

## 2018-10-16 ENCOUNTER — Telehealth: Payer: Self-pay | Admitting: Family Medicine

## 2018-10-16 DIAGNOSIS — R911 Solitary pulmonary nodule: Secondary | ICD-10-CM

## 2018-10-16 NOTE — Telephone Encounter (Signed)
Left message for patient to notify them that it is time to schedule low dose lung cancer screening CT scan. Instructed patient to call back to verify information prior to the scan being scheduled.  

## 2018-10-16 NOTE — Telephone Encounter (Signed)
Please advise patient it is time to recheck chest CT to follow up on lung nodule seen on CT in December. Have entered order and she should hear from sarah this week to get it schedule.

## 2018-10-16 NOTE — Telephone Encounter (Signed)
Called Pt. advising of her Gabapentin refill sent to her pharmacy

## 2018-10-16 NOTE — Telephone Encounter (Signed)
Tried calling patient. Left message to call back. 

## 2018-10-17 ENCOUNTER — Telehealth: Payer: Self-pay

## 2018-10-17 ENCOUNTER — Other Ambulatory Visit: Payer: Self-pay

## 2018-10-17 NOTE — Telephone Encounter (Signed)
-----   Message from Simone Curia sent at 10/17/2018  9:47 AM EDT ----- Regarding: Gabapentin Pt needs you to call her cell phone about her Gabapentin. There is a mix up with the dosage or mg. Thank you

## 2018-10-17 NOTE — Telephone Encounter (Signed)
Opened in error

## 2018-10-17 NOTE — Telephone Encounter (Signed)
I called and spoke with patient.  She was upset because we haven't refilled her Gabapentin.  I informed her that because she has not been seen in 2 years that the physician request patients come in to follow up before any further refills.  I also informed her that her Gabapentin 300mg  was filled on 10/15/2018 by Dr. March Rummage.  I offered her to come in to follow up with Dr. Milinda Pointer, but she stated that she would call back.

## 2018-10-18 ENCOUNTER — Ambulatory Visit: Payer: Self-pay | Admitting: Physician Assistant

## 2018-10-18 ENCOUNTER — Telehealth: Payer: Self-pay

## 2018-10-18 MED ORDER — GABAPENTIN 600 MG PO TABS: 600.0000 mg | ORAL_TABLET | Freq: Two times a day (BID) | ORAL | 5 refills | Status: DC

## 2018-10-18 NOTE — Telephone Encounter (Signed)
Patient's husband called that patient is having a headache and cough. Then the patient came to the phone advised that Dr.Fisher's scheduled is full. Offered a Telephone appointment for 3:40 with Fabio Bering. Patient reports that she does not care for a PA and the only thing she is requesting is a refill for Gabapentin if Dr.Fisher can do that for her because that's where her symptoms are coming from.  CB# [phone]

## 2018-10-21 ENCOUNTER — Other Ambulatory Visit: Payer: Self-pay | Admitting: Family Medicine

## 2018-10-21 DIAGNOSIS — Z1231 Encounter for screening mammogram for malignant neoplasm of breast: Secondary | ICD-10-CM

## 2018-10-21 NOTE — Telephone Encounter (Signed)
Pt advised.   Thanks,   -Nolawi Kanady  

## 2018-10-24 ENCOUNTER — Other Ambulatory Visit: Payer: Self-pay | Admitting: Family Medicine

## 2018-10-24 DIAGNOSIS — G250 Essential tremor: Secondary | ICD-10-CM | POA: Diagnosis not present

## 2018-10-24 DIAGNOSIS — R251 Tremor, unspecified: Secondary | ICD-10-CM | POA: Diagnosis not present

## 2018-10-24 DIAGNOSIS — G629 Polyneuropathy, unspecified: Secondary | ICD-10-CM | POA: Diagnosis not present

## 2018-11-05 ENCOUNTER — Telehealth: Payer: Self-pay | Admitting: Family Medicine

## 2018-11-05 NOTE — Telephone Encounter (Signed)
Pt needs a refill on  Triaminolone ointment 0.1% gel not the cream  Please dispense largest amount available  She has a fair up of atopic dermatitis  [facility] [place] Garfinkel

## 2018-11-06 MED ORDER — TRIAMCINOLONE ACETONIDE 0.1 % EX OINT: TOPICAL_OINTMENT | CUTANEOUS | 3 refills | Status: DC

## 2018-11-19 ENCOUNTER — Other Ambulatory Visit: Payer: Self-pay | Admitting: Family Medicine

## 2018-11-19 MED ORDER — GABAPENTIN 100 MG PO CAPS: 100.0000 mg | ORAL_CAPSULE | Freq: Three times a day (TID) | ORAL | 3 refills | Status: DC

## 2018-11-19 NOTE — Telephone Encounter (Signed)
Please review. Dr. Caryn Section pt. Thanks!

## 2018-11-19 NOTE — Telephone Encounter (Signed)
Pt needing the 100 mg gabapentin  Pt states she takes a 600 mg and a 100 mg.  She has 1 pill left of the 100 mg.   To be called into:  [facility] [place], [place] AT [address] [phone] (Phone) [phone] (Fax   Thanks, [employer]

## 2018-11-27 ENCOUNTER — Ambulatory Visit
Admission: RE | Admit: 2018-11-27 | Discharge: 2018-11-27 | Disposition: A | Discharge status: Home / Self Care | Payer: Medicare Other | Source: Ambulatory Visit | Attending: Family Medicine | Admitting: Family Medicine

## 2018-11-27 ENCOUNTER — Other Ambulatory Visit: Payer: Self-pay

## 2018-11-27 DIAGNOSIS — J439 Emphysema, unspecified: Secondary | ICD-10-CM | POA: Diagnosis not present

## 2018-11-27 DIAGNOSIS — R911 Solitary pulmonary nodule: Secondary | ICD-10-CM

## 2018-11-27 DIAGNOSIS — F17210 Nicotine dependence, cigarettes, uncomplicated: Secondary | ICD-10-CM | POA: Insufficient documentation

## 2018-11-27 DIAGNOSIS — R918 Other nonspecific abnormal finding of lung field: Secondary | ICD-10-CM | POA: Diagnosis not present

## 2018-11-27 DIAGNOSIS — Z122 Encounter for screening for malignant neoplasm of respiratory organs: Secondary | ICD-10-CM | POA: Insufficient documentation

## 2018-11-27 DIAGNOSIS — J432 Centrilobular emphysema: Secondary | ICD-10-CM | POA: Diagnosis not present

## 2018-11-28 ENCOUNTER — Telehealth: Payer: Self-pay | Admitting: Family Medicine

## 2018-11-28 MED ORDER — GABAPENTIN 100 MG PO CAPS: 100.0000 mg | ORAL_CAPSULE | Freq: Three times a day (TID) | ORAL | 12 refills | Status: DC

## 2018-11-28 NOTE — Telephone Encounter (Signed)
Pt calling back regarding her recent lung biopsy. Please call pt back with results.   Pt not understanding why she was prescribed 15 tablets of gabapentin (NEURONTIN) 100 MG capsule for 3 refills.  Thanks, [employer]

## 2018-11-28 NOTE — Telephone Encounter (Signed)
l think she means CT scan of lung, I don't see anything in her record about a biopsy. Her CT scan showed benign appearing nodules. Just needs to continue annual CT scan to make sure it doesn't change.   It looks like Dr. Rosanna Randy refilled gabapentin when I was out. I'll send new refill for quantity of 90.   Marland Kitchen

## 2018-11-28 NOTE — Telephone Encounter (Signed)
Patient advised as below.  

## 2018-11-29 ENCOUNTER — Encounter: Payer: Self-pay | Admitting: *Deleted

## 2018-12-04 ENCOUNTER — Ambulatory Visit
Admission: RE | Admit: 2018-12-04 | Discharge: 2018-12-04 | Disposition: A | Discharge status: Home / Self Care | Payer: Medicare Other | Source: Ambulatory Visit | Attending: Family Medicine | Admitting: Family Medicine

## 2018-12-04 DIAGNOSIS — Z1231 Encounter for screening mammogram for malignant neoplasm of breast: Secondary | ICD-10-CM | POA: Diagnosis not present

## 2018-12-06 ENCOUNTER — Ambulatory Visit: Payer: Medicare Other

## 2018-12-17 ENCOUNTER — Ambulatory Visit: Payer: Medicare Other

## 2018-12-17 ENCOUNTER — Ambulatory Visit: Payer: Medicare Other | Attending: Neurology

## 2018-12-17 ENCOUNTER — Other Ambulatory Visit: Payer: Self-pay

## 2018-12-17 DIAGNOSIS — R2689 Other abnormalities of gait and mobility: Secondary | ICD-10-CM

## 2018-12-17 DIAGNOSIS — M6281 Muscle weakness (generalized): Secondary | ICD-10-CM | POA: Diagnosis not present

## 2018-12-17 DIAGNOSIS — R2681 Unsteadiness on feet: Secondary | ICD-10-CM | POA: Insufficient documentation

## 2018-12-17 NOTE — Patient Instructions (Signed)
Access Code: [email or web address]  URL: [email or web address]  Date: 12/17/2018  Prepared by: Janna Arch   Exercises Seated Long Arc Quad - 10 reps - 2 sets - 5 hold - 1x daily - 7x weekly Hooklying Clamshell with Resistance - 10 reps - 2 sets - 5 hold - 1x daily - 7x weekly Supine Hip Adduction Isometric with Ball - 10 reps - 2 sets - 5 hold - 1x daily - 7x weekly Supine Gluteal Sets - 10 reps - 2 sets - 5 hold - 1x daily - 7x weekly

## 2018-12-17 NOTE — Therapy (Signed)
[facility] SERVICES [address] [place], [place], [address] Phone: [phone]   Fax:  [phone]  Physical Therapy Evaluation  Patient Details  Name: RABECCA KLEPINGER MRN: [id number] Date of Birth: 19-Nov-1950 Referring Provider (PT): Jennings Books   Encounter Date: 12/17/2018  PT End of Session - 12/17/18 1741    Visit Number  1    Number of Visits  16    Date for PT Re-Evaluation  02/11/19    Authorization Type  1/10 eval 12/17/18    PT Start Time  1430    PT Stop Time  1526    PT Time Calculation (min)  56 min    Equipment Utilized During Treatment  Gait belt    Activity Tolerance  Patient limited by pain;Patient limited by fatigue    Behavior During Therapy  [facility] for tasks assessed/performed;Anxious       Past Medical History:  Diagnosis Date  . Anxiety   . Cataract   . Cerebrovascular disease   . Chronic pain    back pain  . Contact dermatitis   . COPD (chronic obstructive pulmonary disease) ([place])   . Depression   . Diverticulosis 10/31/10   On Tranverse colon  . GERD (gastroesophageal reflux disease)   . Myasthenia gravis ([place]) 02/2013   Diagnosed by doctor Manuella Ghazi  . Neuropathy   . Osteoporosis   . Parkinson's disease (tremor, stiffness, slow motion, unstable posture) ([place])   . Stroke ([place])   . Vitamin D deficiency     Past Surgical History:  Procedure Laterality Date  . ABDOMINAL HYSTERECTOMY  2000   due to excessive bleeeding  . CERVICAL CONE BIOPSY    . Cervical discectomy and fusion  [facility]  . COLONOSCOPY WITH PROPOFOL N/A 12/06/2015   Procedure: COLONOSCOPY WITH PROPOFOL;  Surgeon: Manya Silvas, MD;  Location: [facility] ENDOSCOPY;  Service: Endoscopy;  Laterality: N/A;  . ESOPHAGOGASTRODUODENOSCOPY N/A 10/05/2014   Procedure: ESOPHAGOGASTRODUODENOSCOPY (EGD);  Surgeon: Manya Silvas, MD;  Location: [facility] ENDOSCOPY;  Service: Endoscopy;  Laterality: N/A;  . Repeat cuff surgery   2004   Developed adhesive capsulitis  . ROTATOR CUFF REPAIR Right 2003   Shoulder    There were no vitals filed for this visit.   Subjective Assessment - 12/17/18 1638    Subjective  Patient is a pleasant 68 year old female who presents to physical therapy for imbalance and Parkinsons.    Pertinent History  Patient is a pleasant 68 year old female who presents to physical therapy for imbalance and Parkinsons. Has essential tremors and parkinsonism with tremors and shaking of head to 2011, 2012. Has additional tremors of hands with L>R. Tremors have worsened since 2019. Has left eye droopiness, transient diplopia since 2012. Has history of migraine, chronic anorexia, neuropathy, short term memory, anxiety, diverticulosis, dysphagia, fibrocystic breast disease, myasthenia gravis per patient report, osteoporosis, Parkinsons disease, stroke, TMJ, and depression. Has noted worsening of balance, uses a walker or cane for ambulation. Patient was diagnosed with Parkinson's 3 years ago. Patient has episodes where she falls backwards, is able to catch herself.    Limitations  Lifting;Standing;Walking;House hold activities;Other (comment)    How long can you sit comfortably?  needs feet elevated and cushion against back    How long can you stand comfortably?  unsteady as soon as stand up.    How long can you walk comfortably?  immediately painful, requires use of furniture to  walk if doesn't have his cane.    Patient Stated Goals  to get balance better.    Currently in Pain?  Yes    Pain Score  8     Pain Location  Back    Pain Orientation  Lower    Pain Descriptors / Indicators  Radiating    Pain Type  Neuropathic pain    Pain Radiating Towards  to bilateral feet    Pain Onset  More than a month ago    Pain Frequency  Constant    Aggravating Factors   sitting, standing, movement    Pain Relieving Factors  pillow behind back, feet elvated.          PAIN:  Neuropathic pain: back, neck feet,   Worst pain 10/10 Current pain: 8/10 in feet.   POSTURE: Seated: excessive kyphosis, tremors of entire body Standing: excessive kyphosis, trunk flexion, knee hyperextension   PROM/AROM: Limited trunk active range of motion, limited by pain and tremors.   STRENGTH:  Graded on a 0-5 scale Muscle Group Left Right  Hip Flex 3+/5 2+/5 *  Hip Abd 3/5 3/5  Hip Add 2+/5 2+/5  Hip Ext 2+/5 2+/5  Hip IR/ER    Knee Flex 3/5* 3/5*  Knee Ext 3/5* 3/5*  Ankle DF 2+/5* 2/5*  Ankle PF 3+/5 3+/5  * painful   SENSATION: Decreased light touch bilaterally Radiating symptoms, unable to place into slump test  SPECIAL TESTS: Coordination: dysmetria with increased tremors upon attempt at end range attempt   FUNCTIONAL MOBILITY: STS: requires use of hands, excessive tremors throughout entire body with initiation of movement.   BALANCE: Dynamic Sitting Balance  Normal Able to sit unsupported and weight shift across midline maximally   Good Able to sit unsupported and weight shift across midline moderately   Good-/Fair+ Able to sit unsupported and weight shift across midline minimally   Fair Minimal weight shifting ipsilateral/front, difficulty crossing midline x  Fair- Reach to ipsilateral side and unable to weight shift   Poor + Able to sit unsupported with min A and reach to ipsilateral side, unable to weight shift   Poor Able to sit unsupported with mod A and reach ipsilateral/front-can't cross midline     Standing Dynamic Balance  Normal Stand independently unsupported, able to weight shift and cross midline maximally   Good Stand independently unsupported, able to weight shift and cross midline moderately   Good-/Fair+ Stand independently unsupported, able to weight shift across midline minimally   Fair Stand independently unsupported, weight shift, and reach ipsilaterally, loss of balance when crossing midline   Poor+ Able to stand with Min A and reach ipsilaterally, unable to weight  shift x  Poor Able to stand with Mod A and minimally reach ipsilaterally, unable to cross midline.     Static Sitting Balance  Normal Able to maintain balance against maximal resistance   Good Able to maintain balance against moderate resistance   Good-/Fair+ Accepts minimal resistance   Fair Able to sit unsupported without balance loss and without UE support x  Poor+ Able to maintain with Minimal assistance from individual or chair   Poor Unable to maintain balance-requires mod/max support from individual or chair     Static Standing Balance  Normal Able to maintain standing balance against maximal resistance   Good Able to maintain standing balance against moderate resistance   Good-/Fair+ Able to maintain standing balance against minimal resistance   Fair Able to stand unsupported without UE support  and without LOB for 1-2 min   Fair- Requires Min A and UE support to maintain standing without loss of balance x  Poor+ Requires mod A and UE support to maintain standing without loss of balance   Poor Requires max A and UE support to maintain standing balance without loss      GAIT: Uses SPC. Ambulates 4 ft with excessive trunk flexion, lateral weight shift, and one near LOB.   OUTCOME MEASURES: TEST Outcome Interpretation  5 times sit<>stand 33 sec BUE support  >54 yo, >15 sec indicates increased risk for falls  ABC 41.9%           Berg Balance Assessment 25/56 <36/56 (100% risk for falls), 37-45 (80% risk for falls); 46-51 (>50% risk for falls); 52-55 (lower risk <25% of falls)         Access Code: [email or web address]  URL: [email or web address]  Date: 12/17/2018  Prepared by: Janna Arch   Exercises Seated Long Arc Quad - 10 reps - 2 sets - 5 hold - 1x daily - 7x weekly Hooklying Clamshell with Resistance - 10 reps - 2 sets - 5 hold - 1x daily - 7x weekly Supine Hip Adduction Isometric with Ball - 10 reps - 2 sets - 5 hold - 1x daily - 7x weekly Supine Gluteal  Sets - 10 reps - 2 sets - 5 hold - 1x daily - 7x weekly             Objective measurements completed on examination: See above findings.              PT Education - 12/17/18 1741    Education Details  goals, POC, HEP    Person(s) Educated  Patient    Methods  Explanation;Demonstration;Tactile cues;Verbal cues;Handout    Comprehension  Verbalized understanding;Returned demonstration;Verbal cues required;Tactile cues required       PT Short Term Goals - 12/18/18 [id number]      PT SHORT TERM GOAL #1   Title  Patient will be independent in home exercise program to improve strength/mobility for better functional independence with ADLs.    Baseline  9/8: HEP given    Time  2    Period  Weeks    Status  New    Target Date  01/01/19      PT SHORT TERM GOAL #2   Title  Patient will deny any falls over past 2 weeks to demonstrate improved safety awareness at home and work.    Baseline  9/8: falls, frequent near falls posteriorly    Time  2    Period  Weeks    Status  New    Target Date  01/01/19        PT Long Term Goals - 12/18/18 0839      PT LONG TERM GOAL #1   Title  Patient will increase Berg Balance score by > 6 points (31/56) to demonstrate decreased fall risk during functional activities.    Baseline  9/8: 25/56    Time  8    Period  Weeks    Status  New    Target Date  02/11/19      PT LONG TERM GOAL #2   Title  Patient (> 20 years old) will complete five times sit to stand test in < 15 seconds indicating an increased LE strength and improved balance.    Baseline  9/8: 33 seconds with BUE support    Time  8    Period  Weeks    Status  New    Target Date  02/11/19      PT LONG TERM GOAL #3   Title  Patient will increase 10 meter walk test to >1.62m/s as to improve gait speed for better community ambulation and to reduce fall risk.    Baseline  9/8: unable to ambulate distance of 10 MWT    Time  8    Period  Weeks    Status  New    Target Date   02/11/19      PT LONG TERM GOAL #4   Title  Patient will increase ABC scale score >60% to demonstrate better functional mobility and better confidence with ADLs.    Baseline  9/8: 41.9%    Time  8    Period  Weeks    Status  New    Target Date  02/11/19             Plan - 12/18/18 0835    Clinical Impression Statement  Patient is a pleasant 68 year old female who presents to physical therapy for imbalance and parkinsons symptoms. Patient's evaluation is limited by excessive tremors of whole body and severe neuropathic pain in back and feet. Patient is very tearful throughout evaluation due to decline in function resulting in limited quality of life and frustration with current level of mobility. Patient is unable to tolerate sitting without legs on leg rest. She demonstrates diffuse LE weakness with limited tolerance for prolonged muscle recruitment. Patient will benefit from skilled physical therapy to increase strength, stability, and mobility for improved QOL and decreased fall risk.    Personal Factors and Comorbidities  Age;Comorbidity 3+;Education;Fitness;Past/Current Experience;Social Background;Time since onset of injury/illness/exacerbation;Transportation    Comorbidities  anxiety, cerebrovascular disease, chronic pain, COPD, depression, GERD, myasthenia gravis, neuropathy, osteoporosis, Parkinson's disease, and stroke.    Examination-Activity Limitations  Bathing;Bed Mobility;Bend;Caring for Others;Carry;Dressing;Hygiene/Grooming;Stairs;Squat;Sit;Reach Overhead;Locomotion Level;Lift;Stand;Toileting;Transfers;Other    Examination-Participation Restrictions  Church;Cleaning;Community Activity;Driving;Interpersonal Relationship;Volunteer;Shop;Meal Prep;Yard Work;Other    Stability/Clinical Decision Making  Evolving/Moderate complexity    Clinical Decision Making  Moderate    Rehab Potential  Fair    PT Frequency  2x / week    PT Duration  8 weeks    PT Treatment/Interventions   ADLs/Self Care Home Management;Aquatic Therapy;Biofeedback;Canalith Repostioning;Electrical Stimulation;Iontophoresis 4mg /ml Dexamethasone;Moist Heat;Traction;Ultrasound;Therapeutic activities;Functional mobility training;Stair training;Gait training;DME Instruction;Therapeutic exercise;Balance training;Neuromuscular re-education;Patient/family education;Manual techniques;Orthotic Fit/Training;Compression bandaging;Passive range of motion;Dry needling;Energy conservation;Visual/perceptual remediation/compensation;Vestibular;Taping    PT Next Visit Plan  review HEP, standing balance in // bars    PT Home Exercise Plan  see above    Recommended Other Services  n/a    Consulted and Agree with Plan of Care  Patient       Patient will benefit from skilled therapeutic intervention in order to improve the following deficits and impairments:  Abnormal gait, Decreased activity tolerance, Decreased balance, Decreased knowledge of precautions, Decreased endurance, Decreased coordination, Decreased knowledge of use of DME, Decreased mobility, Difficulty walking, Decreased strength, Impaired flexibility, Impaired perceived functional ability, Increased muscle spasms, Impaired sensation, Impaired tone, Impaired UE functional use, Postural dysfunction, Improper body mechanics, Pain  Visit Diagnosis: Unsteadiness on feet  Other abnormalities of gait and mobility  Muscle weakness (generalized)     Problem List Patient Active Problem List   Diagnosis Date Noted  . Lung nodule 03/15/2018  . Aortic atherosclerosis ([place]) 03/14/2018  . Essential tremor 06/25/2017  . Humerus fracture 04/10/2016  . AD (atopic dermatitis) 06/01/2015  . Back pain, chronic 06/01/2015  .  Compression fracture 06/01/2015  . 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 Myasthenia gravis ([place]) 06/01/2015  . TMJ (temporomandibular joint disorder) 12/22/2014  . Diverticulosis 12/22/2014  . Depression 12/22/2014  . GERD (gastroesophageal  reflux disease) 12/22/2014  . Fibrocystic breast disease 12/22/2014  . Dyspnea, unspecified 12/22/2014  . Tremor 12/22/2014  . Radiculopathy of cervical region 12/22/2014  . Tobacco use 12/22/2014  . Diffuse cystic mastopathy of breast 12/22/2014  . Diverticulosis of intestine without perforation or abscess without bleeding 12/22/2014  . History of adenomatous polyp of colon 12/04/2014  . Osteoporosis 12/04/2014  . B12 deficiency 11/04/2014  . Polyneuropathy ([place]) 11/03/2014  . Abnormal barium swallow 10/02/2014  . Dysphagia 10/02/2014  . Anxiety 07/31/2013  . Low back pain 07/31/2013  . Vitamin D deficiency 07/31/2013  . COPD (chronic obstructive pulmonary disease) ([place]) 12/27/2012   Janna Arch, PT, DPT   12/18/2018, 8:43 AM  [place] MAIN [facility] SERVICES [address] [place], [place], [address] Phone: [phone]   Fax:  [phone]  Name: JAHMAYA SIEMER MRN: [id number] Date of Birth: 02-06-1951

## 2018-12-19 ENCOUNTER — Ambulatory Visit: Payer: Medicare Other

## 2018-12-19 ENCOUNTER — Other Ambulatory Visit: Payer: Self-pay

## 2018-12-19 DIAGNOSIS — M6281 Muscle weakness (generalized): Secondary | ICD-10-CM | POA: Diagnosis not present

## 2018-12-19 DIAGNOSIS — R2689 Other abnormalities of gait and mobility: Secondary | ICD-10-CM | POA: Diagnosis not present

## 2018-12-19 DIAGNOSIS — R2681 Unsteadiness on feet: Secondary | ICD-10-CM

## 2018-12-19 NOTE — Therapy (Signed)
[place] MAIN [facility] SERVICES [address] [place], [place], [address] Phone: [phone]   Fax:  [phone]  Physical Therapy Treatment  Patient Details  Name: Alexandria Rodriguez MRN: [id number] Date of Birth: Nov 20, 1950 Referring Provider (PT): Jennings Books   Encounter Date: 12/19/2018  PT End of Session - 12/19/18 1733    Visit Number  2    Number of Visits  16    Date for PT Re-Evaluation  02/11/19    Authorization Type  2/10 eval 12/17/18    PT Start Time  1646    PT Stop Time  1729    PT Time Calculation (min)  43 min    Equipment Utilized During Treatment  Gait belt    Activity Tolerance  Patient limited by pain;Patient limited by fatigue    Behavior During Therapy  [facility] for tasks assessed/performed;Anxious       Past Medical History:  Diagnosis Date  . Anxiety   . Cataract   . Cerebrovascular disease   . Chronic pain    back pain  . Contact dermatitis   . COPD (chronic obstructive pulmonary disease) ([place])   . Depression   . Diverticulosis 10/31/10   On Tranverse colon  . GERD (gastroesophageal reflux disease)   . Myasthenia gravis ([place]) 02/2013   Diagnosed by doctor Manuella Ghazi  . Neuropathy   . Osteoporosis   . Parkinson's disease (tremor, stiffness, slow motion, unstable posture) ([place])   . Stroke ([place])   . Vitamin D deficiency     Past Surgical History:  Procedure Laterality Date  . ABDOMINAL HYSTERECTOMY  2000   due to excessive bleeeding  . CERVICAL CONE BIOPSY    . Cervical discectomy and fusion  [facility]  . COLONOSCOPY WITH PROPOFOL N/A 12/06/2015   Procedure: COLONOSCOPY WITH PROPOFOL;  Surgeon: Manya Silvas, MD;  Location: [facility] ENDOSCOPY;  Service: Endoscopy;  Laterality: N/A;  . ESOPHAGOGASTRODUODENOSCOPY N/A 10/05/2014   Procedure: ESOPHAGOGASTRODUODENOSCOPY (EGD);  Surgeon: Manya Silvas, MD;  Location: [facility] ENDOSCOPY;  Service: Endoscopy;  Laterality: N/A;  . Repeat cuff surgery   2004   Developed adhesive capsulitis  . ROTATOR CUFF REPAIR Right 2003   Shoulder    There were no vitals filed for this visit.  Subjective Assessment - 12/19/18 1732    Subjective  Patient reports compliance with HEP. Is very motivated to do better and walk more again.    Pertinent History  Patient is a pleasant 68 year old female who presents to physical therapy for imbalance and Parkinsons. Has essential tremors and parkinsonism with tremors and shaking of head to 2011, 2012. Has additional tremors of hands with L>R. Tremors have worsened since 2019. Has left eye droopiness, transient diplopia since 2012. Has history of migraine, chronic anorexia, neuropathy, short term memory, anxiety, diverticulosis, dysphagia, fibrocystic breast disease, myasthenia gravis per patient report, osteoporosis, Parkinsons disease, stroke, TMJ, and depression. Has noted worsening of balance, uses a walker or cane for ambulation. Patient was diagnosed with Parkinson's 3 years ago. Patient has episodes where she falls backwards, is able to catch herself.    Limitations  Lifting;Standing;Walking;House hold activities;Other (comment)    How long can you sit comfortably?  needs feet elevated and cushion against back    How long can you stand comfortably?  unsteady as soon as stand up.    How long can you walk comfortably?  immediately painful, requires use of furniture to walk if doesn't  have his cane.    Patient Stated Goals  to get balance better.    Currently in Pain?  Yes    Pain Score  8     Pain Location  Back    Pain Orientation  Lower    Pain Descriptors / Indicators  Radiating    Pain Type  Neuropathic pain    Pain Radiating Towards  bilateral feet    Pain Onset  More than a month ago    Pain Frequency  Constant         Treatment:  in // bars:   Ambulate 2x length with BUE support; cueing for upright posture, foot position, body mechanics Side step in // bars 4x length of // bars cueing for  keeping feet neutral alignment Toe taps onto hedgehogs SUE support 6 minutes, PT guiidng LE and color, patient challenged with cross body taps, fatiguing with repetition resulting in decreased coordination Standing hip extensions 5x each side Large step forward BUE support use of lines for visual cuing 5x each LE  -between standing interventions patient requires seated breaks with pillow behind back, legs elevated onto step for pain reduction   Seated: Long sitting Quad sets 10x 3 second holds Ankle df/pf pumps 12x each LE Hip abduction/adduction 5x each LE Posterior pelvic tilts 8x 3 second holds   Pt educated throughout session about proper posture and technique with exercises. Improved exercise technique, movement at target joints, use of target muscles after min to mod verbal, visual, tactile cues                        PT Education - 12/19/18 1733    Education Details  exercise technique, stability    Person(s) Educated  Patient    Methods  Explanation;Tactile cues;Demonstration;Verbal cues    Comprehension  Verbalized understanding;Returned demonstration;Verbal cues required;Tactile cues required       PT Short Term Goals - 12/18/18 [id number]      PT SHORT TERM GOAL #1   Title  Patient will be independent in home exercise program to improve strength/mobility for better functional independence with ADLs.    Baseline  9/8: HEP given    Time  2    Period  Weeks    Status  New    Target Date  01/01/19      PT SHORT TERM GOAL #2   Title  Patient will deny any falls over past 2 weeks to demonstrate improved safety awareness at home and work.    Baseline  9/8: falls, frequent near falls posteriorly    Time  2    Period  Weeks    Status  New    Target Date  01/01/19        PT Long Term Goals - 12/18/18 0839      PT LONG TERM GOAL #1   Title  Patient will increase Berg Balance score by > 6 points (31/56) to demonstrate decreased fall risk during functional  activities.    Baseline  9/8: 25/56    Time  8    Period  Weeks    Status  New    Target Date  02/11/19      PT LONG TERM GOAL #2   Title  Patient (> 32 years old) will complete five times sit to stand test in < 15 seconds indicating an increased LE strength and improved balance.    Baseline  9/8: 33 seconds with BUE support  Time  8    Period  Weeks    Status  New    Target Date  02/11/19      PT LONG TERM GOAL #3   Title  Patient will increase 10 meter walk test to >1.11m/s as to improve gait speed for better community ambulation and to reduce fall risk.    Baseline  9/8: unable to ambulate distance of 10 MWT    Time  8    Period  Weeks    Status  New    Target Date  02/11/19      PT LONG TERM GOAL #4   Title  Patient will increase ABC scale score >60% to demonstrate better functional mobility and better confidence with ADLs.    Baseline  9/8: 41.9%    Time  8    Period  Weeks    Status  New    Target Date  02/11/19            Plan - 12/19/18 1734    Clinical Impression Statement  Patient presents to physical therapy with fantastic motivation. She is limited in standing due to pain in LE's however continues to attempt prolonged standing despite pain increase. Seated rest breaks with LE's elevated required between standing interventions. Patient is limited in strength and stability with noticeable increase in tremors with fatigue. Patient will benefit from skilled physical therapy to increase strength, stability, and mobility for improved QOL and decreased fall risk.    Personal Factors and Comorbidities  Age;Comorbidity 3+;Education;Fitness;Past/Current Experience;Social Background;Time since onset of injury/illness/exacerbation;Transportation    Comorbidities  anxiety, cerebrovascular disease, chronic pain, COPD, depression, GERD, myasthenia gravis, neuropathy, osteoporosis, Parkinson's disease, and stroke.    Examination-Activity Limitations  Bathing;Bed  Mobility;Bend;Caring for Others;Carry;Dressing;Hygiene/Grooming;Stairs;Squat;Sit;Reach Overhead;Locomotion Level;Lift;Stand;Toileting;Transfers;Other    Examination-Participation Restrictions  Church;Cleaning;Community Activity;Driving;Interpersonal Relationship;Volunteer;Shop;Meal Prep;Yard Work;Other    Stability/Clinical Decision Making  Evolving/Moderate complexity    Rehab Potential  Fair    PT Frequency  2x / week    PT Duration  8 weeks    PT Treatment/Interventions  ADLs/Self Care Home Management;Aquatic Therapy;Biofeedback;Canalith Repostioning;Electrical Stimulation;Iontophoresis 4mg /ml Dexamethasone;Moist Heat;Traction;Ultrasound;Therapeutic activities;Functional mobility training;Stair training;Gait training;DME Instruction;Therapeutic exercise;Balance training;Neuromuscular re-education;Patient/family education;Manual techniques;Orthotic Fit/Training;Compression bandaging;Passive range of motion;Dry needling;Energy conservation;Visual/perceptual remediation/compensation;Vestibular;Taping    PT Next Visit Plan  review HEP, standing balance in // bars    PT Home Exercise Plan  see above    Consulted and Agree with Plan of Care  Patient       Patient will benefit from skilled therapeutic intervention in order to improve the following deficits and impairments:  Abnormal gait, Decreased activity tolerance, Decreased balance, Decreased knowledge of precautions, Decreased endurance, Decreased coordination, Decreased knowledge of use of DME, Decreased mobility, Difficulty walking, Decreased strength, Impaired flexibility, Impaired perceived functional ability, Increased muscle spasms, Impaired sensation, Impaired tone, Impaired UE functional use, Postural dysfunction, Improper body mechanics, Pain  Visit Diagnosis: Unsteadiness on feet  Other abnormalities of gait and mobility  Muscle weakness (generalized)     Problem List Patient Active Problem List   Diagnosis Date Noted  . Lung  nodule 03/15/2018  . Aortic atherosclerosis ([place]) 03/14/2018  . Essential tremor 06/25/2017  . Humerus fracture 04/10/2016  . AD (atopic dermatitis) 06/01/2015  . Back pain, chronic 06/01/2015  . Compression fracture 06/01/2015  . 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 Myasthenia gravis ([place]) 06/01/2015  . TMJ (temporomandibular joint disorder) 12/22/2014  . Diverticulosis 12/22/2014  . Depression 12/22/2014  . GERD (gastroesophageal reflux disease) 12/22/2014  . Fibrocystic breast disease  12/22/2014  . Dyspnea, unspecified 12/22/2014  . Tremor 12/22/2014  . Radiculopathy of cervical region 12/22/2014  . Tobacco use 12/22/2014  . Diffuse cystic mastopathy of breast 12/22/2014  . Diverticulosis of intestine without perforation or abscess without bleeding 12/22/2014  . History of adenomatous polyp of colon 12/04/2014  . Osteoporosis 12/04/2014  . B12 deficiency 11/04/2014  . Polyneuropathy ([place]) 11/03/2014  . Abnormal barium swallow 10/02/2014  . Dysphagia 10/02/2014  . Anxiety 07/31/2013  . Low back pain 07/31/2013  . Vitamin D deficiency 07/31/2013  . COPD (chronic obstructive pulmonary disease) ([place]) 12/27/2012   Janna Arch, PT, DPT   12/19/2018, 5:36 PM  [place] MAIN [facility] SERVICES [address] [place], [place], [address] Phone: [phone]   Fax:  [phone]  Name: Alexandria Rodriguez MRN: [id number] Date of Birth: [birth year]

## 2018-12-23 ENCOUNTER — Ambulatory Visit: Payer: Medicare Other

## 2018-12-25 ENCOUNTER — Ambulatory Visit: Payer: Medicare Other

## 2018-12-30 ENCOUNTER — Ambulatory Visit: Payer: Medicare Other

## 2018-12-30 DIAGNOSIS — L280 Lichen simplex chronicus: Secondary | ICD-10-CM | POA: Diagnosis not present

## 2018-12-30 DIAGNOSIS — L011 Impetiginization of other dermatoses: Secondary | ICD-10-CM | POA: Diagnosis not present

## 2018-12-30 DIAGNOSIS — L239 Allergic contact dermatitis, unspecified cause: Secondary | ICD-10-CM | POA: Diagnosis not present

## 2018-12-31 ENCOUNTER — Ambulatory Visit: Payer: Medicare Other

## 2019-01-07 ENCOUNTER — Ambulatory Visit: Payer: Medicare Other

## 2019-01-09 ENCOUNTER — Ambulatory Visit: Payer: Medicare Other

## 2019-01-15 DIAGNOSIS — L011 Impetiginization of other dermatoses: Secondary | ICD-10-CM | POA: Diagnosis not present

## 2019-01-15 DIAGNOSIS — L209 Atopic dermatitis, unspecified: Secondary | ICD-10-CM | POA: Diagnosis not present

## 2019-01-29 DIAGNOSIS — L299 Pruritus, unspecified: Secondary | ICD-10-CM | POA: Diagnosis not present

## 2019-01-29 DIAGNOSIS — R21 Rash and other nonspecific skin eruption: Secondary | ICD-10-CM | POA: Diagnosis not present

## 2019-01-29 DIAGNOSIS — L308 Other specified dermatitis: Secondary | ICD-10-CM | POA: Diagnosis not present

## 2019-02-12 DIAGNOSIS — L209 Atopic dermatitis, unspecified: Secondary | ICD-10-CM | POA: Diagnosis not present

## 2019-03-20 DIAGNOSIS — L209 Atopic dermatitis, unspecified: Secondary | ICD-10-CM | POA: Diagnosis not present

## 2019-03-20 DIAGNOSIS — L308 Other specified dermatitis: Secondary | ICD-10-CM | POA: Diagnosis not present

## 2019-03-20 DIAGNOSIS — D485 Neoplasm of uncertain behavior of skin: Secondary | ICD-10-CM | POA: Diagnosis not present

## 2019-03-27 ENCOUNTER — Other Ambulatory Visit: Payer: Self-pay | Admitting: Family Medicine

## 2019-03-27 DIAGNOSIS — L209 Atopic dermatitis, unspecified: Secondary | ICD-10-CM | POA: Diagnosis not present

## 2019-04-17 ENCOUNTER — Telehealth: Payer: Self-pay

## 2019-04-17 NOTE — Telephone Encounter (Signed)
Copied from [place] [id number]. Topic: General - Other >> Apr 17, 2019  1:53 PM Celene Kras wrote: Reason for CRM: Pt called and is requesting to have guard rails on her bed because she keeps falling out. Please advise. >> Apr 17, 2019  1:55 PM Celene Kras wrote: Pt is also requesting to have a shower chair. Please advise.

## 2019-04-21 DIAGNOSIS — L400 Psoriasis vulgaris: Secondary | ICD-10-CM | POA: Diagnosis not present

## 2019-04-21 DIAGNOSIS — L209 Atopic dermatitis, unspecified: Secondary | ICD-10-CM | POA: Diagnosis not present

## 2019-04-22 NOTE — Telephone Encounter (Signed)
Order written, please fax

## 2019-04-22 NOTE — Telephone Encounter (Signed)
Call pt to get information of where she would like the requested order to be sent. Pt advised that she would like to pick up the order and take care of it herself. Order has been placed up front for pick up and file copy will be scanned in pt's chart. Thanks TNP

## 2019-04-24 ENCOUNTER — Other Ambulatory Visit: Payer: Self-pay | Admitting: Family Medicine

## 2019-04-30 DIAGNOSIS — Z7689 Persons encountering health services in other specified circumstances: Secondary | ICD-10-CM | POA: Diagnosis not present

## 2019-04-30 DIAGNOSIS — Z79899 Other long term (current) drug therapy: Secondary | ICD-10-CM | POA: Diagnosis not present

## 2019-04-30 DIAGNOSIS — L400 Psoriasis vulgaris: Secondary | ICD-10-CM | POA: Diagnosis not present

## 2019-05-07 DIAGNOSIS — L400 Psoriasis vulgaris: Secondary | ICD-10-CM | POA: Diagnosis not present

## 2019-05-07 DIAGNOSIS — L209 Atopic dermatitis, unspecified: Secondary | ICD-10-CM | POA: Diagnosis not present

## 2019-05-14 ENCOUNTER — Other Ambulatory Visit: Payer: Self-pay | Admitting: Podiatry

## 2019-05-16 ENCOUNTER — Telehealth: Payer: Self-pay

## 2019-05-16 NOTE — Telephone Encounter (Signed)
Copied from [id number] [id number]. Topic: Clinical - COVID Pre-Screen >> May 16, 2019 11:24 AM Rainey Pines A wrote: 1. To the best of your knowledge, have you been in close contact with anyone with a confirmed diagnosis of COVID 19?  no  If no - Proceed to next question; If yes - Schedule patient for a virtual visit  2. Have you had any one or more of the following: fever, chills, cough, shortness of breath or any flu-like symptoms? no  If no - Proceed to next question; If yes - Schedule patient for a virtual visit  3. Have you been diagnosed with or have a previous diagnosis of COVID 19? no  If no - Proceed to next question; If yes - Schedule patient for a virtual visit  4. I am going to go over a few other symptoms with you. Please let me know if you are experiencing any of the following: no  Ear, nose or throat discomfort  A sore throat  Headache  Muscle pain  Diarrhea  Loss of taste or smell  If no - Continue with scheduling process; If yes - Document in scheduling notes   Thank you for answering these questions. Please know we will ask you these questions or similar questions when you arrive for your appointment and again it's how we are keeping everyone safe. Also, to keep you safe, please use the provided hand sanitizer when you enter the building. [name], we are asking everyone in the building to wear a mask because they help [place] prevent the spread of germs.   Do you have a mask of your own, if not, we are happy to provide one for you. The last thing I want to go over with you is the no visitor guidelines. This means no one can attend the appointment with you unless you need physical assistance. I understand this may be different from your past appointments and I know this may be difficult but please know if someone is driving you we are happy to call them for you once your appointment is over.

## 2019-05-19 ENCOUNTER — Encounter: Payer: Self-pay | Admitting: Family Medicine

## 2019-05-19 ENCOUNTER — Ambulatory Visit (INDEPENDENT_AMBULATORY_CARE_PROVIDER_SITE_OTHER): Payer: Medicare Other | Admitting: Family Medicine

## 2019-05-19 ENCOUNTER — Other Ambulatory Visit: Payer: Self-pay

## 2019-05-19 VITALS — BP 117/77 | HR 80 | Temp 96.9°F | Resp 16 | Wt 110.0 lb

## 2019-05-19 DIAGNOSIS — N644 Mastodynia: Secondary | ICD-10-CM

## 2019-05-19 NOTE — Progress Notes (Signed)
Patient: Alexandria Rodriguez Female    DOB: 04/20/1950   69 y.o.   MRN: [id number] Visit Date: 05/19/2019  Today's Provider: Lavon Paganini, MD   Chief Complaint  Patient presents with  . Breast Problem   Subjective:    I [place] S. Dimas, CMA, am acting as scribe for Lavon Paganini, MD.   HPI  Patient C/O painful knot on medial right breast along chest wall x's 3 weeks. Patient reports no changes. Patient denies any rash, redness or discharge.  Some pain associated with it - seems worse with coughing or sneezing  12/04/2018 Mammogram-BI-RADS 1 No history of abnormal mammogram  Allergies  Allergen Reactions  . Effexor Xr [Venlafaxine Hcl Er] Other (See Comments)    Reaction:  GI upset   . Fosamax [Alendronate Sodium] Nausea And Vomiting  . Percocet [Oxycodone-Acetaminophen] Swelling  . Erythromycin Swelling and Rash    Pt states that her tongue swells.    Katrinka Blazing [Lidocaine] Swelling and Rash  . Morphine And Related Swelling, Rash and Other (See Comments)    Pt states that her tongue swells.   Pt states that her tongue swells.    . Sulfa Antibiotics Swelling and Rash    TONGUE SWELLING  . Sulfasalazine Rash and Swelling  . Tramadol Swelling and Rash     Current Outpatient Medications:  .  acetaminophen (TYLENOL) 325 MG tablet, Take 2 tablets (650 mg total) by mouth every 6 (six) hours as needed for mild pain (or Fever >/= 101)., Disp: , Rfl:  .  buPROPion (WELLBUTRIN SR) 150 MG 12 hr tablet, TAKE 1 TABLET BY MOUTH TWO  TIMES DAILY, Disp: 180 tablet, Rfl: 3 .  busPIRone (BUSPAR) 5 MG tablet, TAKE 1 TABLET BY MOUTH TWO  TIMES DAILY, Disp: , Rfl:  .  carbidopa-levodopa (SINEMET IR) 25-100 MG per tablet, Take 1 tablet by mouth 2 (two) times daily. , Disp: , Rfl:  .  carisoprodol (SOMA) 350 MG tablet, TAKE 1 TABLET BY MOUTH TWICE DAILY AS NEEDED, Disp: 60 tablet, Rfl: 5 .  gabapentin (NEURONTIN) 100 MG capsule, Take 1 capsule (100 mg total) by mouth 3 (three) times  daily., Disp: 90 capsule, Rfl: 12 .  gabapentin (NEURONTIN) 600 MG tablet, TAKE 1 TABLET BY MOUTH  TWICE DAILY, Disp: 90 tablet, Rfl: 7 .  IBU 400 MG tablet, TAKE 1 TABLET BY MOUTH  TWICE DAILY AS NEEDED FOR  BACK PAIN, Disp: 180 tablet, Rfl: 4 .  naproxen (NAPROSYN) 500 MG tablet, Take 1 tablet (500 mg total) by mouth 2 (two) times daily with a meal., Disp: 30 tablet, Rfl: 0 .  omeprazole (PRILOSEC) 40 MG capsule, TAKE 1 CAPSULE BY MOUTH  DAILY, Disp: 90 capsule, Rfl: 4 .  primidone (MYSOLINE) 250 MG tablet, Take 250 mg by mouth 2 (two) times daily. , Disp: , Rfl:  .  triamcinolone ointment (KENALOG) 0.1 %, APPLY EXTERNALLY TO THE AFFECTED AREA TWICE DAILY AS NEEDED, Disp: 80 g, Rfl: 3  Review of Systems  Constitutional: Negative for chills and fever.  Respiratory: Negative for cough and shortness of breath.   Cardiovascular: Negative for chest pain and palpitations.    Social History   Tobacco Use  . Smoking status: Current Every Day Smoker    Packs/day: 0.75    Years: 42.00    Pack years: 31.50    Types: Cigarettes  . Smokeless tobacco: Never Used  . Tobacco comment:  Has been smoking for 30+ years, Cigarettes  per day;10.  Substance Use Topics  . Alcohol use: No    Alcohol/week: 0.0 standard drinks      Objective:   BP 117/77 (BP Location: Left Arm, Patient Position: Sitting, Cuff Size: Normal)   Pulse 80   Temp (!) 96.9 F (36.1 C) (Temporal)   Resp 16   Wt 110 lb (49.9 kg)   BMI 20.12 kg/m  Vitals:   05/19/19 1402  BP: 117/77  Pulse: 80  Resp: 16  Temp: (!) 96.9 F (36.1 C)  TempSrc: Temporal  Weight: 110 lb (49.9 kg)  Body mass index is 20.12 kg/m.   Physical Exam Vitals reviewed.  Constitutional:      General: She is not in acute distress. HENT:     Head: Normocephalic and atraumatic.  Cardiovascular:     Rate and Rhythm: Normal rate and regular rhythm.  Pulmonary:     Effort: Pulmonary effort is normal. No respiratory distress.  Genitourinary:     Comments: Breasts: breasts appear normal, no suspicious masses, no skin or nipple changes or axillary nodes. No TTP along chest wall Skin:    General: Skin is warm and dry.     Findings: No rash.  Neurological:     Mental Status: She is alert.  Psychiatric:        Mood and Affect: Mood normal.        Behavior: Behavior normal.      No results found for any visits on 05/19/19.     Assessment & Plan    1. Breast pain in female -New problem x3 weeks -Reviewed normal mammogram from 6 months ago -No palpable lumps present today on my exam or axillary lymphadenopathy -Given the new onset breast pain, however it is advised to get diagnostic mammogram and ultrasound of the right breast -Discussed with the patient that this could also be chest wall pain which could be related to intercostal muscle strain or costochondritis -She can try NSAIDs sparingly -She can ice the area -No cardiac symptoms, but discussed red flags -Discussed return precautions -Further management pending imaging results - [place]; Future - MM DIAG BREAST TOMO UNI RIGHT; Future   Return if symptoms worsen or fail to improve.   The entirety of the information documented in the History of Present Illness, Review of Systems and Physical Exam were personally obtained by me. Portions of this information were initially documented by Lynford Humphrey , CMA and reviewed by me for thoroughness and accuracy.    Conna Terada, Dionne Bucy, MD MPH [facility]

## 2019-05-19 NOTE — Patient Instructions (Signed)

## 2019-05-22 DIAGNOSIS — L400 Psoriasis vulgaris: Secondary | ICD-10-CM | POA: Diagnosis not present

## 2019-05-22 DIAGNOSIS — L299 Pruritus, unspecified: Secondary | ICD-10-CM | POA: Diagnosis not present

## 2019-05-22 DIAGNOSIS — L209 Atopic dermatitis, unspecified: Secondary | ICD-10-CM | POA: Diagnosis not present

## 2019-05-28 ENCOUNTER — Other Ambulatory Visit: Payer: Medicare Other

## 2019-05-28 DIAGNOSIS — L400 Psoriasis vulgaris: Secondary | ICD-10-CM | POA: Diagnosis not present

## 2019-05-28 DIAGNOSIS — L299 Pruritus, unspecified: Secondary | ICD-10-CM | POA: Diagnosis not present

## 2019-06-03 ENCOUNTER — Other Ambulatory Visit: Payer: Medicare Other

## 2019-06-18 DIAGNOSIS — L299 Pruritus, unspecified: Secondary | ICD-10-CM | POA: Diagnosis not present

## 2019-06-18 DIAGNOSIS — L209 Atopic dermatitis, unspecified: Secondary | ICD-10-CM | POA: Diagnosis not present

## 2019-06-18 DIAGNOSIS — L400 Psoriasis vulgaris: Secondary | ICD-10-CM | POA: Diagnosis not present

## 2019-06-26 ENCOUNTER — Other Ambulatory Visit: Payer: Self-pay

## 2019-06-26 ENCOUNTER — Ambulatory Visit: Payer: Medicare Other

## 2019-06-26 DIAGNOSIS — L400 Psoriasis vulgaris: Secondary | ICD-10-CM | POA: Diagnosis not present

## 2019-06-26 MED ORDER — SECUKINUMAB (300 MG DOSE) 150 MG/ML ~~LOC~~ SOAJ
300.0000 mg | Freq: Once | SUBCUTANEOUS | Status: AC
  Administered 2019-06-26: 13:00:00 300 mg via SUBCUTANEOUS

## 2019-07-16 ENCOUNTER — Telehealth: Payer: Self-pay

## 2019-07-16 NOTE — Telephone Encounter (Signed)
Patient called in stating that ever since starting Cosentyx and using Triamcinolone she has gotten worse with more flares. She feels like she is allergic to the Triamcinolone because once it is applied she beings to itch. She has tried the wet wrap but unable to continue those due to it makes her too cold to bare. She is miserable and wants to know can you give her anything oral to help?

## 2019-07-17 MED ORDER — PREDNISONE 20 MG PO TABS: 20.0000 mg | ORAL_TABLET | Freq: Every day | ORAL | 0 refills | Status: DC

## 2019-07-17 NOTE — Telephone Encounter (Signed)
Patient refuses to wait to see Dr. Nehemiah Massed until follow up on April 19th. She is coming in Monday at 3:45.

## 2019-07-17 NOTE — Telephone Encounter (Signed)
Left message for patient to return my call. Prednisone prescription sent in.

## 2019-07-17 NOTE — Telephone Encounter (Signed)
Can send oral steroid. But need to see next week. Discuss side effects of water weight gain; increased appetite and moodiness. May send Prednisone 20 mg 1 po qam disp #7. Take until fu appt next week. Will likely need to send to [facility] for 2nd opinion.

## 2019-07-21 ENCOUNTER — Ambulatory Visit: Payer: Medicare Other | Admitting: Dermatology

## 2019-07-21 ENCOUNTER — Other Ambulatory Visit: Payer: Self-pay

## 2019-07-21 DIAGNOSIS — L409 Psoriasis, unspecified: Secondary | ICD-10-CM | POA: Diagnosis not present

## 2019-07-21 DIAGNOSIS — L309 Dermatitis, unspecified: Secondary | ICD-10-CM | POA: Diagnosis not present

## 2019-07-21 DIAGNOSIS — D485 Neoplasm of uncertain behavior of skin: Secondary | ICD-10-CM | POA: Diagnosis not present

## 2019-07-21 DIAGNOSIS — L209 Atopic dermatitis, unspecified: Secondary | ICD-10-CM | POA: Diagnosis not present

## 2019-07-21 MED ORDER — PREDNISONE 20 MG PO TABS: 20.0000 mg | ORAL_TABLET | ORAL | 0 refills | Status: DC

## 2019-07-21 NOTE — Patient Instructions (Signed)

## 2019-07-21 NOTE — Progress Notes (Signed)
Follow-Up Visit   Subjective  Alexandria Rodriguez is a 69 y.o. female who presents for the following: Psoriasis (Psoriasis follow up. She has finished her loading dose of Consentyx and has an appointment for her next injection 07/28/19. She started a prednisone 20 mg qd last week after calling the office at the end of last week saying she is having a severe flare and she is itching and is driving her crazy.  Since she was already on topical corticosteroids and Cosentyx there was not much else to offer her to relieve her other then a low-dose of systemic steroid.  This was sent in because she could not evaluated that day. She says the steroid orally has not really helped because she is still itching. She says she has breakouts in new areas now.).  This patient is a very very complicated patient.  She has biopsies consistent with atopic dermatitis, but her clinical presentation is more consistent with psoriasis.  She states she has had rashes and skin problems since she was a child and has all her life been told she had eczema/atopic dermatitis.  She may have a combination of these conditions.  She has been evaluated by me, Dr. Laurence Ferrari, and Dr. Nicole Kindred all 3 board-certified dermatologist.  She has been treated with Dupixent for a good course which did not seem to help and she continues to have problems and complaints of symptoms and rash.  She was treated with Rutherford Nail orally and this did not seem to help.  She is now had her full loading dose of Cosentyx while she continues her topical steroids and she is still having trouble and complaints. Although she is still complaining about rash spreading and itching, on examination today her rash is much better than seen in the past.  We discussed today about possibility of contact dermatitis contributing to her conditions and symptoms.  There does not appear to be any evidence of fungal infection today.   The following portions of the chart were reviewed this encounter  and updated as appropriate: Tobacco  Allergies  Meds  Problems  Med Hx  Surg Hx  Fam Hx      Review of Systems: No other skin or systemic complaints.  Objective  Well appearing patient in no apparent distress; mood and affect are within normal limits.  A focused examination was performed including arms, legs, abdomen. Relevant physical exam findings are noted in the Assessment and Plan.  Objective  Left Abdomen (side) - Upper: Pink patches with scaliness.             Assessment & Plan  Neoplasm of uncertain behavior of skin Left Abdomen (side) - Upper  Skin / nail biopsy Type of biopsy: punch   Informed consent: discussed and consent obtained   Timeout: patient name, date of birth, surgical site, and procedure verified   Procedure prep:  Patient was prepped and draped in usual sterile fashion (the patient was cleaned and prepped) Prep type:  Isopropyl alcohol Anesthesia: the lesion was anesthetized in a standard fashion   Anesthetic:  1% lidocaine w/ epinephrine 1-100,000 nerve block Punch size:  3.5 mm Suture size:  4-0 Suture type: nylon   Suture removal (days):  7 Hemostasis achieved with: suture, pressure and aluminum chloride   Outcome: patient tolerated procedure well   Post-procedure details: sterile dressing applied and wound care instructions given   Dressing type: bandage, petrolatum and pressure dressing    Specimen 1 - Surgical pathology Differential Diagnosis: Atopic  dermatitis vs Psoriasis vs Contact Dermatitis Check Margins: No Pink patches with scaliness  3.5 mm punch biopsy to lower abdomen.  Continue Cosentyx as prescribed.  Will plan True Test patch testing on follow up.  Will plan referral to [facility] in [place], [place].   Psoriasis, generalized with combination of Atopic Dermatitis (biopsy-proven) persistent and severely symptomatic and recently flared. This patient is a very very complicated patient.  She has biopsies  consistent with atopic dermatitis, but her clinical presentation is more consistent with psoriasis.  She states she has had rashes and skin problems since she was a child and has all her life been told she had eczema/atopic dermatitis.  She may have a combination of these conditions.  She has been evaluated by me, Dr. Laurence Ferrari, and Dr. Nicole Kindred all 3 board-certified dermatologist.  She has been treated with Dupixent for a good course which did not seem to help and she continues to have problems and complaints of symptoms and rash.  She was treated with [place]ford Nail orally and this did not seem to help.  She is now had her full loading dose of Cosentyx while she continues her topical steroids and she is still having trouble and complaints. Although she is still complaining about rash spreading and itching, on examination today her rash is much better than seen in the past. She has finished her loading dose of Consentyx and has an appointment for her next injection 07/28/19. She started a prednisone 20 mg qd last week after calling the office at the end of last week saying she is having a severe flare and she is itching and is driving her crazy.  Since she was already on topical corticosteroids and Cosentyx there was not much else to offer her to relieve her other then a low-dose of systemic steroid.  This was sent in because she could not evaluated that day. She says the steroid orally has not really helped because she is still itching. She says she has breakouts in new areas now.).  We discussed today about possibility of contact dermatitis.  We will plan True Test Patch testing on fu after pt off Prednisone.   Because she has been evaluated by 3 board-certified dermatologist here at [facility] and still having trouble after being on [place], [place], and Cosentyx with persistent problems and complaints with symptoms, I recommend she be evaluated for second opinion at [facility] dermatology.  We will make an  appointment for her.  Ordered Medications: predniSONE (DELTASONE) 20 MG tablet  Return in about 1 week (around 07/28/2019) for (as scheduled) for biopsy follow up, patch testing, Cosentyx injection.   I, Ashok Cordia, CMA, am acting as scribe for Sarina Ser, MD . Documentation: I have reviewed the above documentation for accuracy and completeness, and I agree with the above.  Sarina Ser, MD

## 2019-07-22 ENCOUNTER — Encounter: Payer: Self-pay | Admitting: Dermatology

## 2019-07-23 NOTE — Addendum Note (Signed)
Addended by: Ashok Cordia B on: 07/23/2019 10:26 AM   Modules accepted: Orders

## 2019-07-28 ENCOUNTER — Ambulatory Visit: Payer: Medicare Other | Admitting: Dermatology

## 2019-07-29 ENCOUNTER — Ambulatory Visit: Payer: Medicare Other | Admitting: Dermatology

## 2019-07-30 ENCOUNTER — Ambulatory Visit: Payer: Medicare Other | Admitting: Dermatology

## 2019-07-30 ENCOUNTER — Other Ambulatory Visit: Payer: Self-pay

## 2019-07-30 DIAGNOSIS — L239 Allergic contact dermatitis, unspecified cause: Secondary | ICD-10-CM

## 2019-07-30 DIAGNOSIS — L409 Psoriasis, unspecified: Secondary | ICD-10-CM

## 2019-07-30 DIAGNOSIS — L308 Other specified dermatitis: Secondary | ICD-10-CM

## 2019-07-30 MED ORDER — SECUKINUMAB 150 MG/ML ~~LOC~~ SOAJ
300.0000 mg | Freq: Once | SUBCUTANEOUS | Status: AC
  Administered 2019-07-30: 15:00:00 300 mg via SUBCUTANEOUS

## 2019-07-30 NOTE — Progress Notes (Signed)
   Follow-Up Visit   Subjective  Alexandria Rodriguez is a 69 y.o. female who presents for the following: Suture / Staple Removal (1 week f/u suture removal biopsy proven Eczema ), Psoriasis (1 week f/u Psoriasis pt using Triamcinolone cream with a poor response ), and Rash (Patch testing).  Husband with pt  The following portions of the chart were reviewed this encounter and updated as appropriate:  Tobacco  Allergies  Meds  Problems  Med Hx  Surg Hx  Fam Hx     Review of Systems:  No other skin or systemic complaints except as noted in HPI or Assessment and Plan.  Objective  Well appearing patient in no apparent distress; mood and affect are within normal limits.  All skin waist up examined.  Objective  exts, trunk: Scaly erythematous papules and patches +/- dyspigmentation, lichenification, excoriations.   Objective  exts, trunk: Well-demarcated erythematous papules/plaques with silvery scale.    Assessment & Plan  Other eczema/ Atopic Dermatitis with Psoriasis Overlap exts, trunk; hands Eucrisa samples given for hands today. (prescribed in past, but insurance would not pay for it)  Pt failed a couple mos trial of Dupixent in recent past.  Biopsy proven "spongiotic dermatitis" =Eczema/Atopic dermatitis, vs Contact dermatitis - - pathology discussed  Pt scheduled at [facility] Aug 25, 2019 due to difficulty with diagnosis and effective treatment.  True patch test applied today on her back and R lower abdomen   Suture removal performed today  Patch Test - exts, trunk  Psoriasis exts, trunk  Start sample of Eucrisa ointment apply to hands bid  Cont Triamcinolone cream apply to body bid   Cosentyx 150 mg x 2  Lot# [id number] Exp 09/2020  Pt does not feel Cosentyx necessarily helping.  Secukinumab SOAJ 300 mg - exts, trunk  Return for 2 days patch reading, .  I, Marye Round, CMA, am acting as scribe for Sarina Ser, MD .  Documentation: I have reviewed  the above documentation for accuracy and completeness, and I agree with the above.  Sarina Ser, MD

## 2019-07-31 ENCOUNTER — Encounter: Payer: Self-pay | Admitting: Dermatology

## 2019-08-01 ENCOUNTER — Other Ambulatory Visit: Payer: Self-pay

## 2019-08-01 ENCOUNTER — Ambulatory Visit: Payer: Medicare Other

## 2019-08-01 DIAGNOSIS — L309 Dermatitis, unspecified: Secondary | ICD-10-CM

## 2019-08-01 NOTE — Progress Notes (Signed)
   Follow-Up Visit   Subjective  Alexandria Rodriguez is a 69 y.o. female who presents for the following: Follow-up (2 day patch test follow up).    The following portions of the chart were reviewed this encounter and updated as appropriate:      Review of Systems:  No other skin or systemic complaints except as noted in HPI or Assessment and Plan.  Objective  Well appearing patient in no apparent distress; mood and affect are within normal limits.  A focused examination was performed including back, abdomen. Relevant physical exam findings are noted in the Assessment and Plan.  Objective  Mid Back: Mild erythema at site #3 - Neomycin Sulfate, #20 - Imidiazolidinyl Urea, #30 - Budesonide, #32 - Mercaptobenzothiazole, #33 - Bacitracin   Assessment & Plan  Eczema, unspecified type Mid Back  Patch read today. Advised patient's husband to read over the weekend and note any reactions he sees.  Return in about 5 days (around 08/06/2019) for as scheduled.  I, Marye Round, CMA, am acting as scribe for [employer], [place] .  Documentation: I have reviewed the above documentation for accuracy and completeness, and I agree with the above.  Marye Round, CMA

## 2019-08-04 ENCOUNTER — Encounter: Payer: Self-pay | Admitting: Family Medicine

## 2019-08-04 ENCOUNTER — Ambulatory Visit (INDEPENDENT_AMBULATORY_CARE_PROVIDER_SITE_OTHER): Payer: Medicare Other | Admitting: Family Medicine

## 2019-08-04 VITALS — Ht 62.0 in | Wt 103.0 lb

## 2019-08-04 DIAGNOSIS — R197 Diarrhea, unspecified: Secondary | ICD-10-CM | POA: Diagnosis not present

## 2019-08-04 NOTE — Progress Notes (Signed)
Established patient visit   Patient: Alexandria Rodriguez   DOB: 1951/03/01   69 y.o. Female  MRN: [id number] Visit Date: 08/04/2019  Today's healthcare provider: Lavon Paganini, MD   Chief Complaint  Patient presents with  . Diarrhea    Virtual Visit via Telephone Note  I connected with [name] on 08/04/19 at 10:20 AM EDT by telephone and verified that I am speaking with the correct person using two identifiers.  Location: Patient location: home Provider location: [facility] Persons involved in the visit: patient, provider   I discussed the limitations, risks, security and privacy concerns of performing an evaluation and management service by telephone and the availability of in person appointments. I also discussed with the patient that there may be a patient responsible charge related to this service. The patient expressed understanding and agreed to proceed.  Subjective    Diarrhea  This is a chronic (patient reports she has a history of divertculitis) problem. The current episode started 1 to 4 weeks ago. The problem occurs 2 to 4 times per day (after eating). The problem has been unchanged. The stool consistency is described as watery. The patient states that diarrhea awakens her from sleep. Associated symptoms include bloating, increased flatus and weight loss. Pertinent negatives include no abdominal pain, chills, fever, headaches, sweats or vomiting. Exacerbated by: food. Risk factors: patient reports that she started a new medication, prescribed by dermatologist. Cosentyx. She has tried bismuth subsalicylate and anti-motility drug for the symptoms. The treatment provided mild relief.   Does not feel like previous episodes of diverticulitis  Ongoing for 1 month Started Cosentyx around the same time as the diarrhea started and seems to be getting worse with every shot that she takes  Has lost ~7 lbs related to the diarrhea  Denies BRBPR, melena, abd  pain, fever Immodium seemed to help but she ran out of it last night  Scared to eat anything due to the diarrhea Having fecal incontinence as well  Social History   Tobacco Use  . Smoking status: Current Every Day Smoker    Packs/day: 0.75    Years: 42.00    Pack years: 31.50    Types: Cigarettes  . Smokeless tobacco: Never Used  . Tobacco comment:  Has been smoking for 30+ years, Cigarettes per day;10.  Substance Use Topics  . Alcohol use: No    Alcohol/week: 0.0 standard drinks  . Drug use: No       Medications: Outpatient Medications Prior to Visit  Medication Sig  . buPROPion (WELLBUTRIN SR) 150 MG 12 hr tablet TAKE 1 TABLET BY MOUTH TWO  TIMES DAILY  . busPIRone (BUSPAR) 5 MG tablet TAKE 1 TABLET BY MOUTH TWO  TIMES DAILY  . carbidopa-levodopa (SINEMET IR) 25-100 MG per tablet Take 1 tablet by mouth 2 (two) times daily.   . carisoprodol (SOMA) 350 MG tablet TAKE 1 TABLET BY MOUTH TWICE DAILY AS NEEDED  . gabapentin (NEURONTIN) 100 MG capsule Take 1 capsule (100 mg total) by mouth 3 (three) times daily.  Marland Kitchen gabapentin (NEURONTIN) 600 MG tablet TAKE 1 TABLET BY MOUTH  TWICE DAILY  . IBU 400 MG tablet TAKE 1 TABLET BY MOUTH  TWICE DAILY AS NEEDED FOR  BACK PAIN  . mupirocin ointment (BACTROBAN) 2 % APPLY A SMALL AMOUNT TO THE AFFECTED AREA AS DIRECTED  . omeprazole (PRILOSEC) 40 MG capsule TAKE 1 CAPSULE BY MOUTH  DAILY  . primidone (MYSOLINE) 250 MG tablet  Take 250 mg by mouth 2 (two) times daily.   . Secukinumab (COSENTYX) 150 MG/ML SOSY Inject into the skin once a week.  . triamcinolone ointment (KENALOG) 0.1 % APPLY EXTERNALLY TO THE AFFECTED AREA TWICE DAILY AS NEEDED  . acetaminophen (TYLENOL) 325 MG tablet Take 2 tablets (650 mg total) by mouth every 6 (six) hours as needed for mild pain (or Fever >/= 101). (Patient not taking: Reported on 08/04/2019)  . ENSTILAR 0.005-0.064 % FOAM Apply  a small amount to skin as directed  qd up to 5 days per week to aa psoriasis on  body, avoid face, groin, axilla  . naproxen (NAPROSYN) 500 MG tablet Take 1 tablet (500 mg total) by mouth 2 (two) times daily with a meal. (Patient not taking: Reported on 08/04/2019)  . predniSONE (DELTASONE) 20 MG tablet Take 1 tablet (20 mg total) by mouth daily with breakfast. (Patient not taking: Reported on 08/04/2019)   No facility-administered medications prior to visit.    Review of Systems  Constitutional: Positive for unexpected weight change and weight loss. Negative for chills and fever.  Cardiovascular: Negative.   Gastrointestinal: Positive for bloating, diarrhea and flatus. Negative for abdominal pain and vomiting.  Skin: Positive for rash.  Neurological: Negative for headaches.    Last CBC Lab Results  Component Value Date   WBC 5.2 08/29/2018   HGB 12.5 08/29/2018   HCT 37.6 08/29/2018   MCV 105.6 (H) 08/29/2018   MCH 35.1 (H) 08/29/2018   RDW 13.9 08/29/2018   PLT 252 [id number]   Last metabolic panel Lab Results  Component Value Date   GLUCOSE 88 08/29/2018   NA 138 08/29/2018   K 4.3 08/29/2018   CL 106 08/29/2018   CO2 25 08/29/2018   BUN 6 (L) 08/29/2018   CREATININE 0.74 08/29/2018   GFRNONAA >60 08/29/2018   GFRAA >60 08/29/2018   CALCIUM 8.4 (L) 08/29/2018   PROT 6.8 08/29/2018   ALBUMIN 3.6 08/29/2018   LABGLOB 2.4 03/15/2018   AGRATIO 1.5 03/15/2018   BILITOT 0.4 08/29/2018   ALKPHOS 117 08/29/2018   AST 12 (L) 08/29/2018   ALT 9 08/29/2018   ANIONGAP 7 08/29/2018       Objective    Ht 5\' 2"  (1.575 m)   Wt 103 lb (46.7 kg)   BMI 18.84 kg/m  BP Readings from Last 3 Encounters:  05/19/19 117/77  08/29/18 131/85  06/18/18 (!) 100/52   Wt Readings from Last 3 Encounters:  08/04/19 103 lb (46.7 kg)  05/19/19 110 lb (49.9 kg)  08/29/18 124 lb (56.2 kg)      Physical Exam  Speaking in full sentences in NAD  No results found for any visits on 08/04/19.  Assessment & Plan    1. Diarrhea of presumed infectious origin - new  problem x ~1 month - coincides with starting Cosentyx for plaque psoriasis per patient - discussed 3-4% side effectrisk of diarrhea with Cosentyx - also discussed that infections are more likel on Cosentyx, so need to rule out bacterial etiology of diarrhea - could also be functional, but seems to have come on abruptly for that - will check CBC, CMP, TSH, as well as stool pathogen panel and C Diff PCR - treatment pending labs to determine etiology - GI Profile, Stool, PCR - Clostridium Difficile by PCR - Comprehensive metabolic panel - CBC - TSH    Return if symptoms worsen or fail to improve.      I, Lavon Paganini, MD,  have reviewed all documentation for this visit. The documentation on 08/04/19 for the exam, diagnosis, procedures, and orders are all accurate and complete.   Davonn Flanery, Dionne Bucy, MD, MPH [facility]

## 2019-08-06 ENCOUNTER — Telehealth: Payer: Self-pay

## 2019-08-06 ENCOUNTER — Ambulatory Visit: Payer: Medicare Other | Admitting: Dermatology

## 2019-08-06 NOTE — Telephone Encounter (Signed)
OK. Agree. Photos would be helpful. We can see for fu when she is able.

## 2019-08-06 NOTE — Telephone Encounter (Signed)
Patient called stating she is sick with major diarrhea again. She has called her PCP to take care of this since it has been a issue in the last two weeks. She has to go for blood work before her PCP will see her. She called very upset but I advised patient to take care of herself first. I advised her and spouse to document every little thing from her patch test today and even take photos.   Patient will call when she feels better and she has control of the diarrhea. I will try to take care of her and getting her back in for a follow up.

## 2019-08-08 NOTE — Telephone Encounter (Signed)
Patient called to say that her diarrhea was so bad that she could not leave the house to have blood work done. She states that she will go in on Monday if all is well to have that done. Any questions please call patient

## 2019-08-25 DIAGNOSIS — L409 Psoriasis, unspecified: Secondary | ICD-10-CM | POA: Diagnosis not present

## 2019-08-25 DIAGNOSIS — Z79899 Other long term (current) drug therapy: Secondary | ICD-10-CM | POA: Diagnosis not present

## 2019-08-25 DIAGNOSIS — L2089 Other atopic dermatitis: Secondary | ICD-10-CM | POA: Diagnosis not present

## 2019-08-27 DIAGNOSIS — Z79899 Other long term (current) drug therapy: Secondary | ICD-10-CM | POA: Diagnosis not present

## 2019-09-01 DIAGNOSIS — L408 Other psoriasis: Secondary | ICD-10-CM | POA: Diagnosis not present

## 2019-09-01 DIAGNOSIS — L280 Lichen simplex chronicus: Secondary | ICD-10-CM | POA: Diagnosis not present

## 2019-09-10 ENCOUNTER — Other Ambulatory Visit: Payer: Self-pay | Admitting: Family Medicine

## 2019-09-10 NOTE — Telephone Encounter (Signed)
Requested medication (s) are due for refill today - unknown  Requested medication (s) are on the active medication list -yes  Future visit scheduled - no  Last refill: 08/11/19  Notes to clinic: patient requesting RF of medication written by outside provider  Requested Prescriptions  Pending Prescriptions Disp Refills   gabapentin (NEURONTIN) 600 MG tablet [Pharmacy Med Name: GABAPENTIN 600MG  TABLETS] 60 tablet     Sig: TAKE 1 TABLET(600 MG) BY MOUTH TWICE DAILY      Neurology: Anticonvulsants - gabapentin Passed - 09/10/2019  3:47 PM      Passed - Valid encounter within last 12 months    Recent Outpatient Visits           1 month ago Diarrhea of presumed infectious origin   [facility], Dionne Bucy, MD   3 months ago Breast pain in female   [facility], Dionne Bucy, MD   1 year ago Tobacco use   [facility] Birdie Sons, MD   1 year ago Annual physical exam   [facility] Birdie Sons, MD   2 years ago Cough   [facility] Birdie Sons, MD                  Requested Prescriptions  Pending Prescriptions Disp Refills   gabapentin (NEURONTIN) 600 MG tablet [Pharmacy Med Name: GABAPENTIN 600MG  TABLETS] 60 tablet     Sig: TAKE 1 TABLET(600 MG) BY MOUTH TWICE DAILY      Neurology: Anticonvulsants - gabapentin Passed - 09/10/2019  3:47 PM      Passed - Valid encounter within last 12 months    Recent Outpatient Visits           1 month ago Diarrhea of presumed infectious origin   [place], Dionne Bucy, MD   3 months ago Breast pain in female   [place], Dionne Bucy, MD   1 year ago Tobacco use   [facility] Birdie Sons, MD   1 year ago Annual physical exam   [facility] Birdie Sons, MD   2 years ago Cough   [facility] Birdie Sons, MD

## 2019-09-15 DIAGNOSIS — L409 Psoriasis, unspecified: Secondary | ICD-10-CM | POA: Diagnosis not present

## 2019-09-15 DIAGNOSIS — L2089 Other atopic dermatitis: Secondary | ICD-10-CM | POA: Diagnosis not present

## 2019-09-15 DIAGNOSIS — Z79899 Other long term (current) drug therapy: Secondary | ICD-10-CM | POA: Diagnosis not present

## 2019-10-08 DIAGNOSIS — H43813 Vitreous degeneration, bilateral: Secondary | ICD-10-CM | POA: Diagnosis not present

## 2019-10-27 ENCOUNTER — Other Ambulatory Visit: Payer: Self-pay | Admitting: Family Medicine

## 2019-10-27 NOTE — Telephone Encounter (Signed)
Requested medications are due for refill today?  Yes  - This refill cannot be delegated.    Requested medications are on active medication list?  Yes  Last Refill:   04/24/2019  # 60 with 5 refills   Future visit scheduled?  No   Notes to Clinic:  This refill cannot be delegated.

## 2019-11-05 ENCOUNTER — Other Ambulatory Visit: Payer: Self-pay | Admitting: Family Medicine

## 2019-11-05 DIAGNOSIS — Z720 Tobacco use: Secondary | ICD-10-CM

## 2019-11-17 ENCOUNTER — Telehealth: Payer: Self-pay | Admitting: *Deleted

## 2019-11-17 NOTE — Telephone Encounter (Signed)
Writer spoke with patient on this date regarding annual lung cancer CT screening scan. Patient stated that she smokes 1 ppd, has no health issues within the last year, no insurance changes, and confirmed that she is not seeing anyone for cancer treatments/care. CT is scheduled for 12-04-19 at 2:30 and patient is aware of appt.

## 2019-11-20 ENCOUNTER — Other Ambulatory Visit: Payer: Self-pay | Admitting: *Deleted

## 2019-11-20 DIAGNOSIS — Z87891 Personal history of nicotine dependence: Secondary | ICD-10-CM

## 2019-11-20 DIAGNOSIS — Z122 Encounter for screening for malignant neoplasm of respiratory organs: Secondary | ICD-10-CM

## 2019-12-02 ENCOUNTER — Other Ambulatory Visit: Payer: Self-pay | Admitting: Family Medicine

## 2019-12-02 MED ORDER — GABAPENTIN 100 MG PO CAPS: 200.0000 mg | ORAL_CAPSULE | Freq: Three times a day (TID) | ORAL | 1 refills | Status: DC

## 2019-12-02 NOTE — Telephone Encounter (Signed)
Medication Refill - Medication: gabapentin (NEURONTIN) 100 MG capsule (Patient stated that she is completely out and needs Dr .Caryn Section to send over prescription stating 2 capsules 3 times daily. )   Has the patient contacted their pharmacy?yes (Agent: If no, request that the patient contact the pharmacy for the refill.) (Agent: If yes, when and what did the pharmacy advise?)Contact PCP  Preferred Pharmacy (with phone number or street name):  [id number] [address], [place] Phone:  [phone]  Fax:  [phone]       Agent: Please be advised that RX refills may take up to 3 business days. We ask that you follow-up with your pharmacy.

## 2019-12-02 NOTE — Telephone Encounter (Signed)
Please review. Ok to send in?

## 2019-12-04 ENCOUNTER — Ambulatory Visit: Admission: RE | Admit: 2019-12-04 | Payer: Medicare Other | Source: Ambulatory Visit

## 2019-12-08 ENCOUNTER — Other Ambulatory Visit: Payer: Self-pay

## 2019-12-08 ENCOUNTER — Ambulatory Visit
Admission: RE | Admit: 2019-12-08 | Discharge: 2019-12-08 | Disposition: A | Discharge status: Home / Self Care | Payer: Medicare Other | Source: Ambulatory Visit | Attending: Nurse Practitioner | Admitting: Nurse Practitioner

## 2019-12-08 DIAGNOSIS — F17210 Nicotine dependence, cigarettes, uncomplicated: Secondary | ICD-10-CM | POA: Diagnosis not present

## 2019-12-08 DIAGNOSIS — Z87891 Personal history of nicotine dependence: Secondary | ICD-10-CM

## 2019-12-08 DIAGNOSIS — Z1159 Encounter for screening for other viral diseases: Secondary | ICD-10-CM | POA: Diagnosis not present

## 2019-12-08 DIAGNOSIS — Z79899 Other long term (current) drug therapy: Secondary | ICD-10-CM | POA: Diagnosis not present

## 2019-12-08 DIAGNOSIS — Z122 Encounter for screening for malignant neoplasm of respiratory organs: Secondary | ICD-10-CM

## 2019-12-08 DIAGNOSIS — L209 Atopic dermatitis, unspecified: Secondary | ICD-10-CM | POA: Diagnosis not present

## 2019-12-10 NOTE — Progress Notes (Signed)
Subjective:   Alexandria Rodriguez is a 69 y.o. female who presents for Medicare Annual (Subsequent) preventive examination.  I connected with Tanna Loeffler today by telephone and verified that I am speaking with the correct person using two identifiers. Location patient: home Location provider: work Persons participating in the virtual visit: patient, provider.   I discussed the limitations, risks, security and privacy concerns of performing an evaluation and management service by telephone and the availability of in person appointments. I also discussed with the patient that there may be a patient responsible charge related to this service. The patient expressed understanding and verbally consented to this telephonic visit.    Interactive audio and video telecommunications were attempted between this provider and patient, however failed, due to patient having technical difficulties OR patient did not have access to video capability.  We continued and completed visit with audio only.   Review of Systems    N/A  Cardiac Risk Factors include: advanced age (>82men, >44 women)     Objective:    Today's Vitals   12/11/19 0907  PainSc: 10-Worst pain ever   There is no height or weight on file to calculate BMI.  Advanced Directives 12/11/2019 12/17/2018 08/29/2018 02/11/2018 01/10/2017 05/08/2016 04/10/2016  Does Patient Have a Medical Advance Directive? No No No No No No No  Would patient like information on creating a medical advance directive? No - Patient declined Yes (MAU/Ambulatory/Procedural Areas - Information given) - Yes (MAU/Ambulatory/Procedural Areas - Information given) Yes (MAU/Ambulatory/Procedural Areas - Information given) Yes (MAU/Ambulatory/Procedural Areas - Information given) No - Patient declined    Current Medications (verified) Outpatient Encounter Medications as of 12/11/2019  Medication Sig  . carisoprodol (SOMA) 350 MG tablet TAKE 1 TABLET BY MOUTH TWICE DAILY AS NEEDED  .  folic acid (FOLVITE) 1 MG tablet Take 1 mg by mouth daily.  Marland Kitchen gabapentin (NEURONTIN) 100 MG capsule Take 2 capsules (200 mg total) by mouth 3 (three) times daily.  Marland Kitchen gabapentin (NEURONTIN) 600 MG tablet TAKE 1 TABLET(600 MG) BY MOUTH TWICE DAILY  . methotrexate (RHEUMATREX) 2.5 MG tablet Take 20 mg by mouth once a week.   . triamcinolone ointment (KENALOG) 0.1 % APPLY EXTERNALLY TO THE AFFECTED AREA TWICE DAILY AS NEEDED  . acetaminophen (TYLENOL) 325 MG tablet Take 2 tablets (650 mg total) by mouth every 6 (six) hours as needed for mild pain (or Fever >/= 101). (Patient not taking: Reported on 08/04/2019)  . buPROPion (WELLBUTRIN SR) 150 MG 12 hr tablet TAKE 1 TABLET BY MOUTH TWO  TIMES DAILY (Patient not taking: Reported on 12/11/2019)  . busPIRone (BUSPAR) 5 MG tablet TAKE 1 TABLET BY MOUTH TWO  TIMES DAILY (Patient not taking: Reported on 12/11/2019)  . carbidopa-levodopa (SINEMET IR) 25-100 MG per tablet Take 1 tablet by mouth 2 (two) times daily.  (Patient not taking: Reported on 12/11/2019)  . ENSTILAR 0.005-0.064 % FOAM Apply  a small amount to skin as directed  qd up to 5 days per week to aa psoriasis on body, avoid face, groin, axilla (Patient not taking: Reported on 12/11/2019)  . IBU 400 MG tablet TAKE 1 TABLET BY MOUTH  TWICE DAILY AS NEEDED FOR  BACK PAIN (Patient not taking: Reported on 12/11/2019)  . mupirocin ointment (BACTROBAN) 2 % APPLY A SMALL AMOUNT TO THE AFFECTED AREA AS DIRECTED (Patient not taking: Reported on 12/11/2019)  . naproxen (NAPROSYN) 500 MG tablet Take 1 tablet (500 mg total) by mouth 2 (two) times daily with a  meal. (Patient not taking: Reported on 08/04/2019)  . omeprazole (PRILOSEC) 40 MG capsule TAKE 1 CAPSULE BY MOUTH  DAILY (Patient not taking: Reported on 12/11/2019)  . predniSONE (DELTASONE) 20 MG tablet Take 1 tablet (20 mg total) by mouth daily with breakfast. (Patient not taking: Reported on 08/04/2019)  . primidone (MYSOLINE) 250 MG tablet Take 250 mg by mouth 2 (two)  times daily.  (Patient not taking: Reported on 12/11/2019)  . Secukinumab (COSENTYX) 150 MG/ML SOSY Inject into the skin once a week. (Patient not taking: Reported on 12/11/2019)   No facility-administered encounter medications on file as of 12/11/2019.    Allergies (verified) Effexor xr [venlafaxine hcl er], Fosamax [alendronate sodium], Percocet [oxycodone-acetaminophen], Erythromycin, Lidoderm [lidocaine], Morphine and related, Sulfa antibiotics, Sulfasalazine, and Tramadol   History: Past Medical History:  Diagnosis Date  . Anxiety   . Cataract   . Cerebrovascular disease   . Chronic pain    back pain  . Contact dermatitis   . COPD (chronic obstructive pulmonary disease) ([place])   . Depression   . Diverticulosis 10/31/10   On Tranverse colon  . GERD (gastroesophageal reflux disease)   . Myasthenia gravis ([place]) 02/2013   Diagnosed by doctor Manuella Ghazi  . Neuropathy   . Osteoporosis   . Parkinson's disease (tremor, stiffness, slow motion, unstable posture) ([place])   . Stroke ([place])   . Vitamin D deficiency    Past Surgical History:  Procedure Laterality Date  . ABDOMINAL HYSTERECTOMY  2000   due to excessive bleeeding  . CERVICAL CONE BIOPSY    . Cervical discectomy and fusion  [facility]  . COLONOSCOPY WITH PROPOFOL N/A 12/06/2015   Procedure: COLONOSCOPY WITH PROPOFOL;  Surgeon: Manya Silvas, MD;  [facility];  Service: Endoscopy;  Laterality: N/A;  . ESOPHAGOGASTRODUODENOSCOPY N/A 10/05/2014   Procedure: ESOPHAGOGASTRODUODENOSCOPY (EGD);  Surgeon: Manya Silvas, MD;  Location: [facility] ENDOSCOPY;  Service: Endoscopy;  Laterality: N/A;  . Repeat cuff surgery  2004   Developed adhesive capsulitis  . ROTATOR CUFF REPAIR Right 2003   Shoulder   Family History  Problem Relation Age of Onset  . Colon cancer Mother   . Stroke Father   . Cancer Sister        Ovarian, stable  . Breast cancer Maternal Grandmother    Social History    Socioeconomic History  . Marital status: Married    Spouse name: Jaquelyn Bitter  . Number of children: 3  . Years of education: Not on file  . Highest education level: Some college, no degree  Occupational History  . Occupation: disabled  Tobacco Use  . Smoking status: Current Every Day Smoker    Packs/day: 0.75    Years: 42.00    Pack years: 31.50    Types: Cigarettes  . Smokeless tobacco: Never Used  . Tobacco comment:  Has been smoking for 30+ years, Cigarettes per day;10.  Vaping Use  . Vaping Use: Never used  Substance and Sexual Activity  . Alcohol use: No    Alcohol/week: 0.0 standard drinks  . Drug use: No  . Sexual activity: Not on file  Other Topics Concern  . Not on file  Social History Narrative   Ambulates with walker rollator or using assitance holding on to walls or solid surfaces.    Social Determinants of Health   Financial Resource Strain: Low Risk   . Difficulty of Paying Living Expenses: Not hard at all  Food Insecurity: No Food  Insecurity  . Worried About Charity fundraiser in the Last Year: Never true  . Ran Out of Food in the Last Year: Never true  Transportation Needs: No Transportation Needs  . Lack of Transportation (Medical): No  . Lack of Transportation (Non-Medical): No  Physical Activity: Inactive  . Days of Exercise per Week: 0 days  . Minutes of Exercise per Session: 0 min  Stress: No Stress Concern Present  . Feeling of Stress : Not at all  Social Connections: Moderately Isolated  . Frequency of Communication with Friends and Family: Three times a week  . Frequency of Social Gatherings with Friends and Family: Never  . Attends Religious Services: Never  . Active Member of Clubs or Organizations: No  . Attends Archivist Meetings: Never  . Marital Status: Married    Tobacco Counseling Ready to quit: No Counseling given: No Comment:  Has been smoking for 30+ years, Cigarettes per day;10.   Clinical Intake:  Pre-visit  preparation completed: Yes  Pain : 0-10 Pain Score: 10-Worst pain ever Pain Type: Neuropathic pain (Neuropathy in feet.) Pain Location: Foot Pain Orientation: Right, Left Pain Descriptors / Indicators: Burning, Aching, Pins and needles Pain Frequency: Constant Pain Relieving Factors: Pt is taking Gabapentin daily for pain (900 mg in AM and 900 mg in PM)  Pain Relieving Factors: Pt is taking Gabapentin daily for pain (900 mg in AM and 900 mg in PM)  Nutritional Risks: None Diabetes: No  How often do you need to have someone help you when you read instructions, pamphlets, or other written materials from your doctor or pharmacy?: 1 - Never  Diabetic? No  Interpreter Needed?: No  Information entered by :: [facility], LPN   Activities of Daily Living In your present state of health, do you have any difficulty performing the following activities: 12/11/2019  Hearing? N  Vision? Y  Comment Due to floaters.  Difficulty concentrating or making decisions? N  Walking or climbing stairs? Y  Comment Due to balance/mobility issues.  Dressing or bathing? N  Doing errands, shopping? Y  Comment Does not drive.  Preparing Food and eating ? Y  Comment Does not cook.  Using the Toilet? N  In the past six months, have you accidently leaked urine? Y  Comment Wears protection daily.  Do you have problems with loss of bowel control? N  Managing your Medications? N  Managing your Finances? N  Housekeeping or managing your Housekeeping? Y  Comment Husband manages housework.  Some recent data might be hidden    Patient Care Team: Birdie Sons, MD as PCP - General (Family Medicine) Clydell Hakim, MD as Consulting Physician (Anesthesiology) [facility], [facility])  Indicate any recent Medical Services you may have received from other than [facility] providers in the past year (date may be approximate).     Assessment:   This is a routine wellness examination for  Tariya.  Hearing/Vision screen No exam data present  Dietary issues and exercise activities discussed: Current Exercise Habits: The patient does not participate in regular exercise at present, Exercise limited by: neurologic condition(s)  Goals    . LIFESTYLE - DECREASE FALLS RISK     Recommend to remove any items from the home that may cause slips or trips.    . Quit smoking / using tobacco     Recommend eliminating smoking habits.      Depression Screen PHQ 2/9 Scores 12/11/2019 02/11/2018 01/10/2017 01/10/2017  PHQ - 2  Score 0 2 1 0  PHQ- 9 Score - 9 14 -    Fall Risk Fall Risk  12/11/2019 02/11/2018 01/10/2017  Falls in the past year? 1 0 Yes  Comment - near miss falls due impaired gait -  Number falls in past yr: 1 0 2 or more  Injury with Fall? 0 - Yes  Risk for fall due to : Medication side effect Impaired mobility Impaired balance/gait;Impaired vision;Medication side effect  Risk for fall due to: Comment - - neuropathy in feet and essential tremors; wears eyeglasses  Follow up Falls prevention discussed Falls prevention discussed;Falls evaluation completed Education provided;Falls prevention discussed    Any stairs in or around the home? Yes  If so, are there any without handrails? No  Home free of loose throw rugs in walkways, pet beds, electrical cords, etc? Yes  Adequate lighting in your home to reduce risk of falls? Yes   ASSISTIVE DEVICES UTILIZED TO PREVENT FALLS:  Life alert? No  Use of a cane, walker or w/c? Yes  Grab bars in the bathroom? Yes  Shower chair or bench in shower? Yes  Elevated toilet seat or a handicapped toilet? No    Cognitive Function:     6CIT Screen 02/11/2018 01/10/2017  What Year? 0 points 0 points  What month? 0 points 0 points  What time? 0 points 0 points  Count back from 20 0 points 0 points  Months in reverse 0 points 0 points  Repeat phrase 4 points 2 points  Total Score 4 2    Immunizations Immunization History  Administered  Date(s) Administered  . Influenza Split 02/16/2011  . Influenza, High Dose Seasonal PF 12/28/2015, 01/10/2017, 02/11/2018  . Influenza,inj,Quad PF,6+ Mos 12/27/2012, 12/31/2013  . Pneumococcal Conjugate-13 01/10/2017  . Pneumococcal Polysaccharide-23 02/16/2011, 02/11/2018    TDAP status: Due, Education has been provided regarding the importance of this vaccine. Advised may receive this vaccine at local pharmacy or Health Dept. Aware to provide a copy of the vaccination record if obtained from local pharmacy or Health Dept. Verbalized acceptance and understanding. Flu Vaccine status: Due fall 2021 Pneumococcal vaccine status: Up to date Covid-19 vaccine status: Declined, Education has been provided regarding the importance of this vaccine but patient still declined. Advised may receive this vaccine at local pharmacy or Health Dept.or vaccine clinic. Aware to provide a copy of the vaccination record if obtained from local pharmacy or Health Dept. Verbalized acceptance and understanding.  Qualifies for Shingles Vaccine? Yes   Zostavax completed No   Shingrix Completed?: No.    Education has been provided regarding the importance of this vaccine. Patient has been advised to call insurance company to determine out of pocket expense if they have not yet received this vaccine. Advised may also receive vaccine at local pharmacy or Health Dept. Verbalized acceptance and understanding.  Screening Tests Health Maintenance  Topic Date Due  . INFLUENZA VACCINE  11/09/2019  . COVID-19 Vaccine (1) 12/27/2019 (Originally 11/10/1962)  . DEXA SCAN  12/10/2020 (Originally 01/24/2015)  . TETANUS/TDAP  12/10/2020 (Originally 11/09/1969)  . MAMMOGRAM  12/03/2020  . COLONOSCOPY  12/05/2020  . Hepatitis C Screening  Completed  . PNA vac Low Risk Adult  Completed    Health Maintenance  Health Maintenance Due  Topic Date Due  . INFLUENZA VACCINE  11/09/2019    Colorectal cancer screening: Completed 12/06/15.  Repeat every 5 years Mammogram status: Completed 12/04/19. Repeat every year Bone Density status: Currently due, declined at this time.  Lung Cancer Screening: (Low Dose CT Chest recommended if Age 6-80 years, 30 pack-year currently smoking OR have quit w/in 15years.) does qualify however had this completed 12/08/19. Repeat yearly.   Additional Screening:  Hepatitis C Screening: Up to date  Vision Screening: Recommended annual ophthalmology exams for early detection of glaucoma and other disorders of the eye. Is the patient up to date with their annual eye exam?  Yes  Who is the provider or what is the name of the office in which the patient attends annual eye exams? [facility] If pt is not established with a provider, would they like to be referred to a provider to establish care? No .   Dental Screening: Recommended annual dental exams for proper oral hygiene  Community Resource Referral / Chronic Care Management: CRR required this visit?  No   CCM required this visit?  No      Plan:     I have personally reviewed and noted the following in the patient's chart:   . Medical and social history . Use of alcohol, tobacco or illicit drugs  . Current medications and supplements . Functional ability and status . Nutritional status . Physical activity . Advanced directives . List of other physicians . Hospitalizations, surgeries, and ER visits in previous 12 months . Vitals . Screenings to include cognitive, depression, and falls . Referrals and appointments  In addition, I have reviewed and discussed with patient certain preventive protocols, quality metrics, and best practice recommendations. A written personalized care plan for preventive services as well as general preventive health recommendations were provided to patient.     Jadavion Spoelstra [place], [place]   07/17/1854   Nurse Notes: Pt needs a flu shot when available. Declined a DEXA scan or Covid vaccine at this time.

## 2019-12-11 ENCOUNTER — Other Ambulatory Visit: Payer: Self-pay

## 2019-12-11 ENCOUNTER — Ambulatory Visit (INDEPENDENT_AMBULATORY_CARE_PROVIDER_SITE_OTHER): Payer: Medicare Other

## 2019-12-11 DIAGNOSIS — Z0000 Encounter for general adult medical examination without abnormal findings: Secondary | ICD-10-CM

## 2019-12-11 NOTE — Patient Instructions (Signed)
[name] , Thank you for taking time to come for your Medicare Wellness Visit. I appreciate your ongoing commitment to your health goals. Please review the following plan we discussed and let me know if I can assist you in the future.   Screening recommendations/referrals: Colonoscopy: Up to date, due 11/2020 Mammogram: Up to date, due 11/2019 Bone Density: Currently due, declined at this time. Recommended yearly ophthalmology/optometry visit for glaucoma screening and checkup Recommended yearly dental visit for hygiene and checkup  Vaccinations: Influenza vaccine: Due fall 2021 Pneumococcal vaccine: Completed series Tdap vaccine: Currently due, declined today. Shingles vaccine: Shingrix discussed. Please contact your pharmacy for coverage information.     Advanced directives: Advance directive discussed with you today. Even though you declined this today please call our office should you change your mind and we can give you the proper paperwork for you to fill out.  Conditions/risks identified: Fall risk preventatives and smoking cessation discussed today.   Next appointment: 12/16/20 @ 10:00 AM for an AWV. Declined scheduling a follow up with PCP at this time.   Preventive Care 70 Years and Older, Female Preventive care refers to lifestyle choices and visits with your health care provider that can promote health and wellness. What does preventive care include?  A yearly physical exam. This is also called an annual well check.  Dental exams once or twice a year.  Routine eye exams. Ask your health care provider how often you should have your eyes checked.  Personal lifestyle choices, including:  Daily care of your teeth and gums.  Regular physical activity.  Eating a healthy diet.  Avoiding tobacco and drug use.  Limiting alcohol use.  Practicing safe sex.  Taking low-dose aspirin every day.  Taking vitamin and mineral supplements as recommended by your health care  provider. What happens during an annual well check? The services and screenings done by your health care provider during your annual well check will depend on your age, overall health, lifestyle risk factors, and family history of disease. Counseling  Your health care provider may ask you questions about your:  Alcohol use.  Tobacco use.  Drug use.  Emotional well-being.  Home and relationship well-being.  Sexual activity.  Eating habits.  History of falls.  Memory and ability to understand (cognition).  Work and work Statistician.  Reproductive health. Screening  You may have the following tests or measurements:  Height, weight, and BMI.  Blood pressure.  Lipid and cholesterol levels. These may be checked every 5 years, or more frequently if you are over 60 years old.  Skin check.  Lung cancer screening. You may have this screening every year starting at age 69 if you have a 30-pack-year history of smoking and currently smoke or have quit within the past 15 years.  Fecal occult blood test (FOBT) of the stool. You may have this test every year starting at age 69.  Flexible sigmoidoscopy or colonoscopy. You may have a sigmoidoscopy every 5 years or a colonoscopy every 10 years starting at age 69.  Hepatitis C blood test.  Hepatitis B blood test.  Sexually transmitted disease (STD) testing.  Diabetes screening. This is done by checking your blood sugar (glucose) after you have not eaten for a while (fasting). You may have this done every 1-3 years.  Bone density scan. This is done to screen for osteoporosis. You may have this done starting at age 69.  Mammogram. This may be done every 1-2 years. Talk to your health  care provider about how often you should have regular mammograms. Talk with your health care provider about your test results, treatment options, and if necessary, the need for more tests. Vaccines  Your health care provider may recommend certain  vaccines, such as:  Influenza vaccine. This is recommended every year.  Tetanus, diphtheria, and acellular pertussis (Tdap, Td) vaccine. You may need a Td booster every 10 years.  Zoster vaccine. You may need this after age 70.  Pneumococcal 13-valent conjugate (PCV13) vaccine. One dose is recommended after age 69.  Pneumococcal polysaccharide (PPSV23) vaccine. One dose is recommended after age 69. Talk to your health care provider about which screenings and vaccines you need and how often you need them. This information is not intended to replace advice given to you by your health care provider. Make sure you discuss any questions you have with your health care provider. Document Released: 04/23/2015 Document Revised: 12/15/2015 Document Reviewed: 01/26/2015 Elsevier Interactive Patient Education  2017 [place] Prevention in the Home Falls can cause injuries. They can happen to people of all ages. There are many things you can do to make your home safe and to help prevent falls. What can I do on the outside of my home?  Regularly fix the edges of walkways and driveways and fix any cracks.  Remove anything that might make you trip as you walk through a door, such as a raised step or threshold.  Trim any bushes or trees on the path to your home.  Use bright outdoor lighting.  Clear any walking paths of anything that might make someone trip, such as rocks or tools.  Regularly check to see if handrails are loose or broken. Make sure that both sides of any steps have handrails.  Any raised decks and porches should have guardrails on the edges.  Have any leaves, snow, or ice cleared regularly.  Use sand or salt on walking paths during winter.  Clean up any spills in your garage right away. This includes oil or grease spills. What can I do in the bathroom?  Use night lights.  Install grab bars by the toilet and in the tub and shower. Do not use towel bars as grab  bars.  Use non-skid mats or decals in the tub or shower.  If you need to sit down in the shower, use a plastic, non-slip stool.  Keep the floor dry. Clean up any water that spills on the floor as soon as it happens.  Remove soap buildup in the tub or shower regularly.  Attach bath mats securely with double-sided non-slip rug tape.  Do not have throw rugs and other things on the floor that can make you trip. What can I do in the bedroom?  Use night lights.  Make sure that you have a light by your bed that is easy to reach.  Do not use any sheets or blankets that are too big for your bed. They should not hang down onto the floor.  Have a firm chair that has side arms. You can use this for support while you get dressed.  Do not have throw rugs and other things on the floor that can make you trip. What can I do in the kitchen?  Clean up any spills right away.  Avoid walking on wet floors.  Keep items that you use a lot in easy-to-reach places.  If you need to reach something above you, use a strong step stool that has a grab  bar.  Keep electrical cords out of the way.  Do not use floor polish or wax that makes floors slippery. If you must use wax, use non-skid floor wax.  Do not have throw rugs and other things on the floor that can make you trip. What can I do with my stairs?  Do not leave any items on the stairs.  Make sure that there are handrails on both sides of the stairs and use them. Fix handrails that are broken or loose. Make sure that handrails are as long as the stairways.  Check any carpeting to make sure that it is firmly attached to the stairs. Fix any carpet that is loose or worn.  Avoid having throw rugs at the top or bottom of the stairs. If you do have throw rugs, attach them to the floor with carpet tape.  Make sure that you have a light switch at the top of the stairs and the bottom of the stairs. If you do not have them, ask someone to add them for  you. What else can I do to help prevent falls?  Wear shoes that:  Do not have high heels.  Have rubber bottoms.  Are comfortable and fit you well.  Are closed at the toe. Do not wear sandals.  If you use a stepladder:  Make sure that it is fully opened. Do not climb a closed stepladder.  Make sure that both sides of the stepladder are locked into place.  Ask someone to hold it for you, if possible.  Clearly mark and make sure that you can see:  Any grab bars or handrails.  First and last steps.  Where the edge of each step is.  Use tools that help you move around (mobility aids) if they are needed. These include:  Canes.  Walkers.  Scooters.  Crutches.  Turn on the lights when you go into a dark area. Replace any light bulbs as soon as they burn out.  Set up your furniture so you have a clear path. Avoid moving your furniture around.  If any of your floors are uneven, fix them.  If there are any pets around you, be aware of where they are.  Review your medicines with your doctor. Some medicines can make you feel dizzy. This can increase your chance of falling. Ask your doctor what other things that you can do to help prevent falls. This information is not intended to replace advice given to you by your health care provider. Make sure you discuss any questions you have with your health care provider. Document Released: 01/21/2009 Document Revised: 09/02/2015 Document Reviewed: 05/01/2014 Elsevier Interactive Patient Education  2017 [employer].

## 2019-12-18 ENCOUNTER — Encounter: Payer: Self-pay | Admitting: *Deleted

## 2019-12-18 ENCOUNTER — Telehealth: Payer: Self-pay

## 2019-12-18 DIAGNOSIS — I700 Atherosclerosis of aorta: Secondary | ICD-10-CM

## 2019-12-18 NOTE — Telephone Encounter (Signed)
Copied from [place] [id number]. Topic: General - Other >> Dec 18, 2019 10:16 AM Rainey Pines A wrote: Pt requesting callback to go over most recent imaging results

## 2019-12-19 MED ORDER — ROSUVASTATIN CALCIUM 10 MG PO TABS: 10.0000 mg | ORAL_TABLET | Freq: Every day | ORAL | 12 refills | Status: DC

## 2019-12-19 NOTE — Telephone Encounter (Signed)
Patient has been advised. KW 

## 2019-12-19 NOTE — Telephone Encounter (Signed)
I assume she is talking about her chest CT scan from 12-08-2019. There are no lung tumor, but she does have emphysema and cholesterol plaques in her arteries. Both of these are caused by smoking so she should really try as hard as possible to stop smoking.  The cholesterol plaques can end up blocking arteries and causing strokes or heart attacks. They can be treated with cholesterol medication. I recommend starting rosuvastatin 10mg  once a day, #30, rf x 12.

## 2019-12-19 NOTE — Addendum Note (Signed)
Addended by: Minette Headland on: 12/19/2019 04:31 PM   Modules accepted: Orders

## 2019-12-19 NOTE — Telephone Encounter (Signed)
Called to advise patient as below, LVMTCB.

## 2020-01-14 ENCOUNTER — Other Ambulatory Visit: Payer: Self-pay | Admitting: Family Medicine

## 2020-01-14 ENCOUNTER — Telehealth: Payer: Self-pay | Admitting: Family Medicine

## 2020-01-14 NOTE — Telephone Encounter (Signed)
Pt need a refill  gabapentin (NEURONTIN) 100 MG capsule [[id number]]  [id number] - [address], [place] AT [facility] [address]  [place] [address]  Phone: [phone] Fax: [phone]

## 2020-01-15 DIAGNOSIS — Z79899 Other long term (current) drug therapy: Secondary | ICD-10-CM | POA: Diagnosis not present

## 2020-01-15 DIAGNOSIS — H2513 Age-related nuclear cataract, bilateral: Secondary | ICD-10-CM | POA: Diagnosis not present

## 2020-01-15 DIAGNOSIS — Z1159 Encounter for screening for other viral diseases: Secondary | ICD-10-CM | POA: Diagnosis not present

## 2020-01-20 ENCOUNTER — Ambulatory Visit: Payer: Medicare Other | Admitting: Family Medicine

## 2020-01-20 NOTE — Progress Notes (Deleted)
Complete physical exam   Patient: Alexandria Rodriguez   DOB: 1951-04-09   69 y.o. Female  MRN: [id number] Visit Date: 01/20/2020  Today's healthcare provider: Lelon Huh, MD   No chief complaint on file.  Subjective    Alexandria Rodriguez is a 69 y.o. female who presents today for a complete physical exam.  She reports consuming a {diet types:17450} diet. {Exercise:19826} She generally feels {well/fairly well/poorly:18703}. She reports sleeping {well/fairly well/poorly:18703}. She {does/does not:200015} have additional problems to discuss today.  HPI  Patient had a AWE with [facility] at 12/11/2019  Past Medical History:  Diagnosis Date  . Anxiety   . Cataract   . Cerebrovascular disease   . Chronic pain    back pain  . Contact dermatitis   . COPD (chronic obstructive pulmonary disease) ([place])   . Depression   . Diverticulosis 10/31/10   On Tranverse colon  . GERD (gastroesophageal reflux disease)   . Myasthenia gravis ([place]) 02/2013   Diagnosed by doctor Manuella Ghazi  . Neuropathy   . Osteoporosis   . Parkinson's disease (tremor, stiffness, slow motion, unstable posture) ([place])   . Stroke ([place])   . Vitamin D deficiency    Past Surgical History:  Procedure Laterality Date  . ABDOMINAL HYSTERECTOMY  2000   due to excessive bleeeding  . CERVICAL CONE BIOPSY    . Cervical discectomy and fusion  [facility]  . COLONOSCOPY WITH PROPOFOL N/A 12/06/2015   Procedure: COLONOSCOPY WITH PROPOFOL;  Surgeon: Manya Silvas, MD;  Location: [facility] ENDOSCOPY;  Service: Endoscopy;  Laterality: N/A;  . ESOPHAGOGASTRODUODENOSCOPY N/A 10/05/2014   Procedure: ESOPHAGOGASTRODUODENOSCOPY (EGD);  Surgeon: Manya Silvas, MD;  Location: [facility] ENDOSCOPY;  Service: Endoscopy;  Laterality: N/A;  . Repeat cuff surgery  2004   Developed adhesive capsulitis  . ROTATOR CUFF REPAIR Right 2003   Shoulder   Social History   Socioeconomic History  . Marital status: Married    Spouse name:  Jaquelyn Bitter  . Number of children: 3  . Years of education: Not on file  . Highest education level: Some college, no degree  Occupational History  . Occupation: disabled  Tobacco Use  . Smoking status: Current Every Day Smoker    Packs/day: 0.75    Years: 42.00    Pack years: 31.50    Types: Cigarettes  . Smokeless tobacco: Never Used  . Tobacco comment:  Has been smoking for 30+ years, Cigarettes per day;10.  Vaping Use  . Vaping Use: Never used  Substance and Sexual Activity  . Alcohol use: No    Alcohol/week: 0.0 standard drinks  . Drug use: No  . Sexual activity: Not on file  Other Topics Concern  . Not on file  Social History Narrative   Ambulates with walker rollator or using assitance holding on to walls or solid surfaces.    Social Determinants of Health   Financial Resource Strain: Low Risk   . Difficulty of Paying Living Expenses: Not hard at all  Food Insecurity: No Food Insecurity  . Worried About Charity fundraiser in the Last Year: Never true  . Ran Out of Food in the Last Year: Never true  Transportation Needs: No Transportation Needs  . Lack of Transportation (Medical): No  . Lack of Transportation (Non-Medical): No  Physical Activity: Inactive  . Days of Exercise per Week: 0 days  . Minutes of Exercise per Session: 0 min  Stress: No Stress Concern  Present  . Feeling of Stress : Not at all  Social Connections: Moderately Isolated  . Frequency of Communication with Friends and Family: Three times a week  . Frequency of Social Gatherings with Friends and Family: Never  . Attends Religious Services: Never  . Active Member of Clubs or Organizations: No  . Attends Archivist Meetings: Never  . Marital Status: Married  Human resources officer Violence: Not At Risk  . Fear of Current or Ex-Partner: No  . Emotionally Abused: No  . Physically Abused: No  . Sexually Abused: No   Family Status  Relation Name Status  . Mother  Deceased at age 47       From  complication of colon cancer  . Father  Deceased       Died from a stroke, and heart disease  . Sister  Alive  . Brother  Alive       colon polyps  . MGM  (Not Specified)  . Son  Alive   Family History  Problem Relation Age of Onset  . Colon cancer Mother   . Stroke Father   . Cancer Sister        Ovarian, stable  . Breast cancer Maternal Grandmother    Allergies  Allergen Reactions  . Effexor Xr [Venlafaxine Hcl Er] Other (See Comments)    Reaction:  GI upset   . Fosamax [Alendronate Sodium] Nausea And Vomiting  . Percocet [Oxycodone-Acetaminophen] Swelling  . Erythromycin Swelling and Rash    Pt states that her tongue swells.    Katrinka Blazing [Lidocaine] Swelling and Rash  . Morphine And Related Swelling, Rash and Other (See Comments)    Pt states that her tongue swells.   Pt states that her tongue swells.    . Sulfa Antibiotics Swelling and Rash    TONGUE SWELLING  . Sulfasalazine Rash and Swelling  . Tramadol Swelling and Rash    Patient Care Team: Birdie Sons, MD as PCP - General (Family Medicine) Clydell Hakim, MD as Consulting Physician (Anesthesiology) [facility], [facility])   Medications: Outpatient Medications Prior to Visit  Medication Sig  . acetaminophen (TYLENOL) 325 MG tablet Take 2 tablets (650 mg total) by mouth every 6 (six) hours as needed for mild pain (or Fever >/= 101). (Patient not taking: Reported on 08/04/2019)  . buPROPion (WELLBUTRIN SR) 150 MG 12 hr tablet TAKE 1 TABLET BY MOUTH TWO  TIMES DAILY (Patient not taking: Reported on 12/11/2019)  . busPIRone (BUSPAR) 5 MG tablet TAKE 1 TABLET BY MOUTH TWO  TIMES DAILY (Patient not taking: Reported on 12/11/2019)  . carbidopa-levodopa (SINEMET IR) 25-100 MG per tablet Take 1 tablet by mouth 2 (two) times daily.  (Patient not taking: Reported on 12/11/2019)  . carisoprodol (SOMA) 350 MG tablet TAKE 1 TABLET BY MOUTH TWICE DAILY AS NEEDED  . ENSTILAR 0.005-0.064 % FOAM Apply  a small amount to  skin as directed  qd up to 5 days per week to aa psoriasis on body, avoid face, groin, axilla (Patient not taking: Reported on 12/11/2019)  . folic acid (FOLVITE) 1 MG tablet Take 1 mg by mouth daily.  Marland Kitchen gabapentin (NEURONTIN) 100 MG capsule TAKE 2 CAPSULES(200 MG) BY MOUTH THREE TIMES DAILY  . gabapentin (NEURONTIN) 600 MG tablet TAKE 1 TABLET(600 MG) BY MOUTH TWICE DAILY  . IBU 400 MG tablet TAKE 1 TABLET BY MOUTH  TWICE DAILY AS NEEDED FOR  BACK PAIN (Patient not taking: Reported on 12/11/2019)  .  mupirocin ointment (BACTROBAN) 2 % APPLY A SMALL AMOUNT TO THE AFFECTED AREA AS DIRECTED (Patient not taking: Reported on 12/11/2019)  . naproxen (NAPROSYN) 500 MG tablet Take 1 tablet (500 mg total) by mouth 2 (two) times daily with a meal. (Patient not taking: Reported on 08/04/2019)  . omeprazole (PRILOSEC) 40 MG capsule TAKE 1 CAPSULE BY MOUTH  DAILY (Patient not taking: Reported on 12/11/2019)  . predniSONE (DELTASONE) 20 MG tablet Take 1 tablet (20 mg total) by mouth daily with breakfast. (Patient not taking: Reported on 08/04/2019)  . primidone (MYSOLINE) 250 MG tablet Take 250 mg by mouth 2 (two) times daily.  (Patient not taking: Reported on 12/11/2019)  . rosuvastatin (CRESTOR) 10 MG tablet Take 1 tablet (10 mg total) by mouth daily.  . Secukinumab (COSENTYX) 150 MG/ML SOSY Inject into the skin once a week. (Patient not taking: Reported on 12/11/2019)  . triamcinolone ointment (KENALOG) 0.1 % APPLY EXTERNALLY TO THE AFFECTED AREA TWICE DAILY AS NEEDED   No facility-administered medications prior to visit.    Review of Systems  Constitutional: Negative.   HENT: Negative.   Eyes: Negative.   Respiratory: Negative.   Cardiovascular: Negative.   Gastrointestinal: Negative.   Endocrine: Negative.   Genitourinary: Negative.   Musculoskeletal: Negative.   Skin: Negative.   Allergic/Immunologic: Negative.   Neurological: Negative.   Hematological: Negative.   Psychiatric/Behavioral: Negative.      {Heme  Chem  Endocrine  Serology  Results Review (optional):23779::" "}  Objective    There were no vitals taken for this visit. {Show previous vital signs (optional):23777::" "}  Physical Exam  ***  Last depression screening scores PHQ 2/9 Scores 12/11/2019 02/11/2018 01/10/2017  PHQ - 2 Score 0 2 1  PHQ- 9 Score - 9 14   Last fall risk screening Fall Risk  12/11/2019  Falls in the past year? 1  Comment -  Number falls in past yr: 1  Injury with Fall? 0  Risk for fall due to : Medication side effect  Risk for fall due to: Comment -  Follow up Falls prevention discussed   Last Audit-C alcohol use screening Alcohol Use Disorder Test (AUDIT) 12/11/2019  1. How often do you have a drink containing alcohol? 0  2. How many drinks containing alcohol do you have on a typical day when you are drinking? 0  3. How often do you have six or more drinks on one occasion? 0  AUDIT-C Score 0  Alcohol Brief Interventions/Follow-up AUDIT Score <7 follow-up not indicated   A score of 3 or more in women, and 4 or more in men indicates increased risk for alcohol abuse, EXCEPT if all of the points are from question 1   No results found for any visits on 01/20/20.  Assessment & Plan    Routine Health Maintenance and Physical Exam  Exercise Activities and Dietary recommendations Goals    . LIFESTYLE - DECREASE FALLS RISK     Recommend to remove any items from the home that may cause slips or trips.    . Quit smoking / using tobacco     Recommend eliminating smoking habits.       Immunization History  Administered Date(s) Administered  . Influenza Split 02/16/2011  . Influenza, High Dose Seasonal PF 12/28/2015, 01/10/2017, 02/11/2018  . Influenza,inj,Quad PF,6+ Mos 12/27/2012, 12/31/2013  . Pneumococcal Conjugate-13 01/10/2017  . Pneumococcal Polysaccharide-23 02/16/2011, 02/11/2018    Health Maintenance  Topic Date Due  . COVID-19 Vaccine (1) Never  done  . INFLUENZA VACCINE   11/09/2019  . DEXA SCAN  12/10/2020 (Originally 01/24/2015)  . TETANUS/TDAP  12/10/2020 (Originally [birth year]/1969)  . MAMMOGRAM  12/03/2020  . COLONOSCOPY  12/05/2020  . Hepatitis C Screening  Completed  . PNA vac Low Risk Adult  Completed    Discussed health benefits of physical activity, and encouraged her to engage in regular exercise appropriate for her age and condition.  ***  No follow-ups on file.     {provider attestation***:1}   Lelon Huh, MD  [facility] [phone] (phone) [phone] (fax)  [place]

## 2020-02-25 ENCOUNTER — Other Ambulatory Visit: Payer: Self-pay | Admitting: Family Medicine

## 2020-02-25 NOTE — Telephone Encounter (Signed)
Requested medication (s) are due for refill today: Yes  Requested medication (s) are on the active medication list: Yes  Last refill:  10/28/19  Future visit scheduled: No  Notes to clinic:  See request.    Requested Prescriptions  Pending Prescriptions Disp Refills   carisoprodol (SOMA) 350 MG tablet [Pharmacy Med Name: CARISOPRODOL 350MG  TABLETS] 60 tablet     Sig: TAKE 1 TABLET BY MOUTH TWICE DAILY AS NEEDED      Not Delegated - Analgesics:  Muscle Relaxants Failed - 02/25/2020 12:34 PM      Failed - This refill cannot be delegated      Failed - Valid encounter within last 6 months    Recent Outpatient Visits           6 months ago Diarrhea of presumed infectious origin   [facility], Dionne Bucy, MD   9 months ago Breast pain in female   [facility], Dionne Bucy, MD   1 year ago Tobacco use   [facility] Birdie Sons, MD   1 year ago Annual physical exam   [facility] Birdie Sons, MD   2 years ago Cough   [facility] Birdie Sons, MD               Signed Prescriptions Disp Refills   gabapentin (NEURONTIN) 600 MG tablet 60 tablet 5    Sig: TAKE 1 TABLET(600 MG) BY MOUTH TWICE DAILY      Neurology: Anticonvulsants - gabapentin Passed - 02/25/2020 12:34 PM      Passed - Valid encounter within last 12 months    Recent Outpatient Visits           6 months ago Diarrhea of presumed infectious origin   [facility], Dionne Bucy, MD   9 months ago Breast pain in female   [place], Dionne Bucy, MD   1 year ago Tobacco use   [facility] Birdie Sons, MD   1 year ago Annual physical exam   [facility] Birdie Sons, MD   2 years ago Cough   [facility] Birdie Sons, MD

## 2020-03-01 ENCOUNTER — Other Ambulatory Visit: Payer: Self-pay | Admitting: Family Medicine

## 2020-03-05 ENCOUNTER — Other Ambulatory Visit: Payer: Self-pay | Admitting: Family Medicine

## 2020-03-05 NOTE — Telephone Encounter (Signed)
  Notes to clinic:  Please verify that patient is taking both doses and review for refill    Requested Prescriptions  Pending Prescriptions Disp Refills   gabapentin (NEURONTIN) 100 MG capsule [Pharmacy Med Name: GABAPENTIN 100MG  CAPSULES] 90 capsule 2    Sig: TAKE 1 CAPSULE(100 MG) BY MOUTH THREE TIMES DAILY      Neurology: Anticonvulsants - gabapentin Passed - 03/05/2020  1:31 PM      Passed - Valid encounter within last 12 months    Recent Outpatient Visits           7 months ago Diarrhea of presumed infectious origin   [place], Dionne Bucy, MD   9 months ago Breast pain in female   [facility], Dionne Bucy, MD   1 year ago Tobacco use   [facility] Birdie Sons, MD   1 year ago Annual physical exam   [facility] Birdie Sons, MD   2 years ago Cough   [facility] Birdie Sons, MD

## 2020-03-08 NOTE — Telephone Encounter (Signed)
Please review [facility] triage note to clinic.

## 2020-03-12 ENCOUNTER — Encounter: Payer: Self-pay | Admitting: Family Medicine

## 2020-03-12 ENCOUNTER — Other Ambulatory Visit: Payer: Self-pay

## 2020-03-12 ENCOUNTER — Ambulatory Visit (INDEPENDENT_AMBULATORY_CARE_PROVIDER_SITE_OTHER): Payer: Medicare Other | Admitting: Family Medicine

## 2020-03-12 VITALS — BP 109/52 | HR 79 | Temp 98.0°F | Resp 16 | Wt 107.0 lb

## 2020-03-12 DIAGNOSIS — Z23 Encounter for immunization: Secondary | ICD-10-CM | POA: Diagnosis not present

## 2020-03-12 DIAGNOSIS — R2689 Other abnormalities of gait and mobility: Secondary | ICD-10-CM

## 2020-03-12 DIAGNOSIS — J449 Chronic obstructive pulmonary disease, unspecified: Secondary | ICD-10-CM

## 2020-03-12 DIAGNOSIS — R251 Tremor, unspecified: Secondary | ICD-10-CM

## 2020-03-12 DIAGNOSIS — I700 Atherosclerosis of aorta: Secondary | ICD-10-CM

## 2020-03-12 DIAGNOSIS — E559 Vitamin D deficiency, unspecified: Secondary | ICD-10-CM

## 2020-03-12 DIAGNOSIS — E538 Deficiency of other specified B group vitamins: Secondary | ICD-10-CM

## 2020-03-12 NOTE — Progress Notes (Signed)
Established patient visit   Patient: Alexandria Rodriguez   DOB: [birth year]   69 y.o. Female  MRN: [id number] Visit Date: 03/12/2020  Today's healthcare provider: Lelon Huh, MD   Chief Complaint  Patient presents with  . Follow-up   Subjective    HPI  Follow up for Vitamin D Deficiency:   The patient was last seen for this 1 year ago. Changes made at last visit include no changes.  She reports good compliance with treatment. She feels that condition is Unchanged. She is not having side effects.       She also requests referral to another neurologist. She had been followed for a few years by Dr. Manuella Ghazi for tremor and gait disturbance and prescribed several medications for possible essential tremor versus Parkinson's versus myasthensia and reports them none of them helped. She states at her last visit she was told she doesn't have any of this condition leaving her very confused, frustrated, and continuing to have persistent tremors and difficulty with her balance with difficult ambulation and occasional falls.    Medications: Outpatient Medications Prior to Visit  Medication Sig  . carisoprodol (SOMA) 350 MG tablet TAKE 1 TABLET BY MOUTH TWICE DAILY AS NEEDED  . gabapentin (NEURONTIN) 100 MG capsule Take 2 capsules (200 mg total) by mouth 3 (three) times daily. Take in addition to 600mg  gabapentin tablets  . gabapentin (NEURONTIN) 600 MG tablet TAKE 1 TABLET(600 MG) BY MOUTH TWICE DAILY  . rosuvastatin (CRESTOR) 10 MG tablet Take 1 tablet (10 mg total) by mouth daily.  Marland Kitchen triamcinolone ointment (KENALOG) 0.1 % APPLY EXTERNALLY TO THE AFFECTED AREA TWICE DAILY AS NEEDED  . acetaminophen (TYLENOL) 325 MG tablet Take 2 tablets (650 mg total) by mouth every 6 (six) hours as needed for mild pain (or Fever >/= 101). (Patient not taking: Reported on 08/04/2019)  . ENSTILAR 0.005-0.064 % FOAM Apply  a small amount to skin as directed  qd up to 5 days per week to aa psoriasis on body, avoid  face, groin, axilla (Patient not taking: Reported on 12/11/2019)  . folic acid (FOLVITE) 1 MG tablet Take 1 mg by mouth daily. (Patient not taking: Reported on 03/12/2020)  . IBU 400 MG tablet TAKE 1 TABLET BY MOUTH  TWICE DAILY AS NEEDED FOR  BACK PAIN (Patient not taking: Reported on 12/11/2019)  . omeprazole (PRILOSEC) 40 MG capsule TAKE 1 CAPSULE BY MOUTH  DAILY (Patient not taking: Reported on 12/11/2019)  . [DISCONTINUED] buPROPion (WELLBUTRIN SR) 150 MG 12 hr tablet TAKE 1 TABLET BY MOUTH TWO  TIMES DAILY (Patient not taking: Reported on 12/11/2019)  . [DISCONTINUED] busPIRone (BUSPAR) 5 MG tablet TAKE 1 TABLET BY MOUTH TWO  TIMES DAILY (Patient not taking: Reported on 12/11/2019)  . [DISCONTINUED] carbidopa-levodopa (SINEMET IR) 25-100 MG per tablet Take 1 tablet by mouth 2 (two) times daily.  (Patient not taking: Reported on 12/11/2019)  . [DISCONTINUED] mupirocin ointment (BACTROBAN) 2 % APPLY A SMALL AMOUNT TO THE AFFECTED AREA AS DIRECTED (Patient not taking: Reported on 12/11/2019)  . [DISCONTINUED] naproxen (NAPROSYN) 500 MG tablet Take 1 tablet (500 mg total) by mouth 2 (two) times daily with a meal. (Patient not taking: Reported on 08/04/2019)  . [DISCONTINUED] predniSONE (DELTASONE) 20 MG tablet Take 1 tablet (20 mg total) by mouth daily with breakfast. (Patient not taking: Reported on 08/04/2019)  . [DISCONTINUED] primidone (MYSOLINE) 250 MG tablet Take 250 mg by mouth 2 (two) times daily.  (Patient not taking: Reported  on 12/11/2019)  . [DISCONTINUED] Secukinumab (COSENTYX) 150 MG/ML SOSY Inject into the skin once a week. (Patient not taking: Reported on 12/11/2019)   No facility-administered medications prior to visit.    Review of Systems  Constitutional: Negative.   Respiratory: Negative.   Cardiovascular: Negative.   Musculoskeletal: Positive for gait problem.  Neurological: Positive for tremors.      Objective    BP (!) 109/52   Pulse 79   Temp 98 F (36.7 C)   Resp 16   Wt 107 lb  (48.5 kg)   BMI 19.57 kg/m    Physical Exam   General: Appearance:    Thin female in no acute distress  Eyes:    PERRL, conjunctiva/corneas clear, EOM's intact       Lungs:     Clear to auscultation bilaterally, respirations unlabored  Heart:    Normal heart rate. Normal rhythm. No murmurs, rubs, or gallops.   MS:   All extremities are intact.   Neurologic:   Awake, alert, oriented x 3. Persistent tremor of head and hands at rest and with intension. Positive cogwheeling. Shuffling gait.          Assessment & Plan     1. Vitamin D deficiency  - VITAMIN D 25 Hydroxy (Vit-D Deficiency, Fractures)  2. B12 deficiency  - Vitamin B12  3. Aortic atherosclerosis ([place]) She is tolerating rosuvastatin well with no adverse effects.   - CBC - Comprehensive metabolic panel - Lipid panel  4. Tremor  - Ambulatory referral to Neurology  5. Balance disorder She is very frustrated with current work up treatment and requests referral to tertiary care clinic for definitive evaluation   - Ambulatory referral to Neurology  6. Chronic obstructive pulmonary disease, unspecified COPD type ([place]) Encourage smoking cessation. Not requiring inhalers at this point.   7. Need for influenza vaccination  - Flu Vaccine QUAD High Dose(Fluad)   No follow-ups on file.      The entirety of the information documented in the History of Present Illness, Review of Systems and Physical Exam were personally obtained by me. Portions of this information were initially documented by the CMA and reviewed by me for thoroughness and accuracy.      Lelon Huh, MD  [facility] [phone] (phone) [phone] (fax)  [place]

## 2020-03-13 LAB — COMPREHENSIVE METABOLIC PANEL
ALT: 27 IU/L (ref 0–32)
AST: 24 IU/L (ref 0–40)
Albumin/Globulin Ratio: 1.8 (ref 1.2–2.2)
Albumin: 4.2 g/dL (ref 3.8–4.8)
Alkaline Phosphatase: 97 IU/L (ref 44–121)
BUN/Creatinine Ratio: 7 — ABNORMAL LOW (ref 12–28)
BUN: 6 mg/dL — ABNORMAL LOW (ref 8–27)
Bilirubin Total: 0.5 mg/dL (ref 0.0–1.2)
CO2: 23 mmol/L (ref 20–29)
Calcium: 8.9 mg/dL (ref 8.7–10.3)
Chloride: 104 mmol/L (ref 96–106)
Creatinine, Ser: 0.81 mg/dL (ref 0.57–1.00)
GFR calc Af Amer: 86 mL/min/{1.73_m2} (ref >59)
GFR calc non Af Amer: 74 mL/min/{1.73_m2} (ref >59)
Globulin, Total: 2.3 g/dL (ref 1.5–4.5)
Glucose: 86 mg/dL (ref 65–99)
Potassium: 4.3 mmol/L (ref 3.5–5.2)
Sodium: 139 mmol/L (ref 134–144)
Total Protein: 6.5 g/dL (ref 6.0–8.5)

## 2020-03-13 LAB — LIPID PANEL
Chol/HDL Ratio: 2 ratio (ref 0.0–4.4)
Cholesterol, Total: 118 mg/dL (ref 100–199)
HDL: 60 mg/dL (ref >39)
LDL Chol Calc (NIH): 45 mg/dL (ref 0–99)
Triglycerides: 57 mg/dL (ref 0–149)
VLDL Cholesterol Cal: 13 mg/dL (ref 5–40)

## 2020-03-13 LAB — CBC
Hematocrit: 37 % (ref 34.0–46.6)
Hemoglobin: 12.7 g/dL (ref 11.1–15.9)
MCH: 33.8 pg — ABNORMAL HIGH (ref 26.6–33.0)
MCHC: 34.3 g/dL (ref 31.5–35.7)
MCV: 98 fL — ABNORMAL HIGH (ref 79–97)
Platelets: 200 10*3/uL (ref 150–450)
RBC: 3.76 x10E6/uL — ABNORMAL LOW (ref 3.77–5.28)
RDW: 12.7 % (ref 11.7–15.4)
WBC: 5.1 10*3/uL (ref 3.4–10.8)

## 2020-03-13 LAB — VITAMIN D 25 HYDROXY (VIT D DEFICIENCY, FRACTURES): Vit D, 25-Hydroxy: 4.8 ng/mL — ABNORMAL LOW (ref 30.0–100.0)

## 2020-03-13 LAB — VITAMIN B12: Vitamin B-12: 207 pg/mL — ABNORMAL LOW (ref 232–1245)

## 2020-04-05 ENCOUNTER — Other Ambulatory Visit: Payer: Self-pay

## 2020-04-05 DIAGNOSIS — Z20822 Contact with and (suspected) exposure to covid-19: Secondary | ICD-10-CM

## 2020-04-05 NOTE — Progress Notes (Signed)
Order placed for covid testing due to exposure. Ok per Dr. Sherrie Mustache.

## 2020-04-07 ENCOUNTER — Telehealth: Payer: Self-pay

## 2020-04-07 LAB — SARS-COV-2, NAA 2 DAY TAT

## 2020-04-07 LAB — NOVEL CORONAVIRUS, NAA: SARS-CoV-2, NAA: NOT DETECTED

## 2020-04-07 NOTE — Telephone Encounter (Signed)
Patient's husband Tyneisha Hegeman would like to know whether Dr. Sherrie Mustache recommends that patient receive a COVID vaccine. Patient is still on the fence about getting vaccinated. They are concerned about the vaccine interacting with her medications and medical problems and allergies. Please advise.   Husband's Call back number [phone]

## 2020-04-08 NOTE — Telephone Encounter (Signed)
The vaccine will not interact with any of the medications. Definitely recommend they get the vaccine.

## 2020-04-08 NOTE — Telephone Encounter (Signed)
Patient advised and verbalized understanding. Patient would like to get a COVID vaccine here in out office. Please call patient for scheduling appointment.

## 2020-05-10 ENCOUNTER — Ambulatory Visit: Payer: Self-pay | Admitting: *Deleted

## 2020-05-10 NOTE — Telephone Encounter (Signed)
C/o hissing and buzzing in bilateral ears , with left ear worse than right, since yesterday around 10:30-1100 am. Patient reports she is having trouble sleeping and anxious due to constant tinnitus.denies earache, headache, or dizziness.  appt scheduled for 05/11/20. Care advise given. Patient verbalized understanding of care advise and to call back if symptoms worsen.   Reason for Disposition . MODERATE-SEVERE tinnitus (i.e., interferes with work, school, or sleep)  Answer Assessment - Initial Assessment Questions 1. DESCRIPTION: "Describe the sound you are hearing." (e.g., hissing, humming, pounding, ringing)     Hissing and buzzing sounds 2. LOCATION: "One or both ears?" If one, ask: "Which ear?"     Both ears mostly left ear 3. SEVERITY: "How bad is it?"    - MILD - doesn't interfere with normal activities, only can hear in a quiet room    - MODERATE-SEVERE ([place]): interferes with work, school, sleep, or other activities      Moderate to severe 4. ONSET: "When did this begin?" "Did it start suddenly or come on gradually?"     Yesterday , sudden 10:30 -11:00am  5. PATTERN: "Does this come and go, or has it been constant since it started?"     Constant  6. HEARING LOSS: "Is your hearing decreased?" (e.g., normal, decreased)       No  7. OTHER SYMPTOMS: "Do you have any other symptoms?" (e.g., dizziness, earache)     No  8. PREGNANCY: "Is there any chance you are pregnant?" "When was your last menstrual period?"     na  Protocols used: [facility]

## 2020-05-11 ENCOUNTER — Other Ambulatory Visit: Payer: Self-pay

## 2020-05-11 ENCOUNTER — Ambulatory Visit (INDEPENDENT_AMBULATORY_CARE_PROVIDER_SITE_OTHER): Payer: Medicare Other | Admitting: Adult Health

## 2020-05-11 ENCOUNTER — Encounter: Payer: Self-pay | Admitting: Adult Health

## 2020-05-11 VITALS — BP 113/65 | HR 71 | Resp 16 | Wt 109.6 lb

## 2020-05-11 DIAGNOSIS — H6982 Other specified disorders of Eustachian tube, left ear: Secondary | ICD-10-CM

## 2020-05-11 DIAGNOSIS — H65112 Acute and subacute allergic otitis media (mucoid) (sanguinous) (serous), left ear: Secondary | ICD-10-CM | POA: Diagnosis not present

## 2020-05-11 DIAGNOSIS — H6992 Unspecified Eustachian tube disorder, left ear: Secondary | ICD-10-CM | POA: Insufficient documentation

## 2020-05-11 DIAGNOSIS — H6692 Otitis media, unspecified, left ear: Secondary | ICD-10-CM | POA: Diagnosis not present

## 2020-05-11 MED ORDER — AMOXICILLIN 875 MG PO TABS: 875.0000 mg | ORAL_TABLET | Freq: Two times a day (BID) | ORAL | 0 refills | Status: DC

## 2020-05-11 MED ORDER — CETIRIZINE HCL 10 MG PO TABS: 10.0000 mg | ORAL_TABLET | Freq: Every day | ORAL | 0 refills | Status: DC

## 2020-05-11 NOTE — Patient Instructions (Signed)
Otitis Media, Adult  Otitis media is a condition in which the middle ear is red and swollen (inflamed) and full of fluid. The middle ear is the part of the ear that contains bones for hearing as well as air that helps send sounds to the brain. The condition usually goes away on its own. What are the causes? This condition is caused by a blockage in the eustachian tube. The eustachian tube connects the middle ear to the back of the nose. It normally allows air into the middle ear. The blockage is caused by fluid or swelling. Problems that can cause blockage include:  A cold or infection that affects the nose, mouth, or throat.  Allergies.  An irritant, such as tobacco smoke.  Adenoids that have become large. The adenoids are soft tissue located in the back of the throat, behind the nose and the roof of the mouth.  Growth or swelling in the upper part of the throat, just behind the nose (nasopharynx).  Damage to the ear caused by change in pressure. This is called barotrauma. What are the signs or symptoms? Symptoms of this condition include:  Ear pain.  Fever.  Problems with hearing.  Being tired.  Fluid leaking from the ear.  Ringing in the ear. How is this treated? This condition can go away on its own within 3-5 days. But if the condition is caused by bacteria or does not go away on its own, or if it keeps coming back, your doctor may:  Give you antibiotic medicines.  Give you medicines for pain. Follow these instructions at home:  Take over-the-counter and prescription medicines only as told by your doctor.  If you were prescribed an antibiotic medicine, take it as told by your doctor. Do not stop taking the antibiotic even if you start to feel better.  Keep all follow-up visits as told by your doctor. This is important. Contact a doctor if:  You have bleeding from your nose.  There is a lump on your neck.  You are not feeling better in 5 days.  You feel worse  instead of better. Get help right away if:  You have pain that is not helped with medicine.  You have swelling, redness, or pain around your ear.  You get a stiff neck.  You cannot move part of your face (paralysis).  You notice that the bone behind your ear hurts when you touch it.  You get a very bad headache. Summary  Otitis media means that the middle ear is red, swollen, and full of fluid.  This condition usually goes away on its own.  If the problem does not go away, treatment may be needed. You may be given medicines to treat the infection or to treat your pain.  If you were prescribed an antibiotic medicine, take it as told by your doctor. Do not stop taking the antibiotic even if you start to feel better.  Keep all follow-up visits as told by your doctor. This is important. This information is not intended to replace advice given to you by your health care provider. Make sure you discuss any questions you have with your health care provider. Document Revised: 02/27/2019 Document Reviewed: 02/27/2019 Elsevier Patient Education  2021 [employer]. Tinnitus Tinnitus refers to hearing a sound when there is no actual source for that sound. This is often described as ringing in the ears. However, people with this condition may hear a variety of noises, in one ear or in both  ears. The sounds of tinnitus can be soft, loud, or somewhere in between. Tinnitus can last for a few seconds or can be constant for days. It may go away without treatment and come back at various times. When tinnitus is constant or happens often, it can lead to other problems, such as trouble sleeping and trouble concentrating. Almost everyone experiences tinnitus at some point. Tinnitus that is long-lasting (chronic) or comes back often (recurs) may require medical attention. What are the causes? The cause of tinnitus is often not known. In some cases, it can result from:  Exposure to loud noises from  machinery, music, or other sources.  An object (foreign body) stuck in the ear.  Earwax buildup.  Drinking alcohol or caffeine.  Taking certain medicines.  Age-related hearing loss. It may also be caused by medical conditions such as:  Ear or sinus infections.  High blood pressure.  Heart diseases.  Anemia.  Allergies.  Meniere's disease.  Thyroid problems.  Tumors.  A weak, bulging blood vessel (aneurysm) near the ear. What are the signs or symptoms? The main symptom of tinnitus is hearing a sound when there is no source for that sound. It may sound like:  Buzzing.  Roaring.  Ringing.  Blowing air.  Hissing.  Whistling.  Sizzling.  Humming.  Running water.  A musical note.  Tapping. Symptoms may affect only one ear (unilateral) or both ears (bilateral). How is this diagnosed? Tinnitus is diagnosed based on your symptoms, your medical history, and a physical exam. Your health care provider may do a thorough hearing test (audiologic exam) if your tinnitus:  Is unilateral.  Causes hearing difficulties.  Lasts 6 months or longer. You may work with a health care provider who specializes in hearing disorders (audiologist). You may be asked questions about your symptoms and how they affect your daily life. You may have other tests done, such as:  CT scan.  MRI.  An imaging test of how blood flows through your blood vessels (angiogram). How is this treated? Treating an underlying medical condition can sometimes make tinnitus go away. If your tinnitus continues, other treatments may include:  Medicines.  Therapy and counseling to help you manage the stress of living with tinnitus.  Sound generators to mask the tinnitus. These include: ? Tabletop sound machines that play relaxing sounds to help you fall asleep. ? Wearable devices that fit in your ear and play sounds or music. ? Acoustic neural stimulation. This involves using headphones to listen  to music that contains an auditory signal. Over time, listening to this signal may change some pathways in your brain and make you less sensitive to tinnitus. This treatment is used for very severe cases when no other treatment is working.  Using hearing aids or cochlear implants if your tinnitus is related to hearing loss. Hearing aids are worn in the outer ear. Cochlear implants are surgically placed in the inner ear. Follow these instructions at home: Managing symptoms  When possible, avoid being in loud places and being exposed to loud sounds.  Wear hearing protection, such as earplugs, when you are exposed to loud noises.  Use a white noise machine, a humidifier, or other devices to mask the sound of tinnitus.  Practice techniques for reducing stress, such as meditation, yoga, or deep breathing. Work with your health care provider if you need help with managing stress.  Sleep with your head slightly raised. This may reduce the impact of tinnitus.      General  instructions  Do not use stimulants, such as nicotine, alcohol, or caffeine. Talk with your health care provider about other stimulants to avoid. Stimulants are substances that can make you feel alert and attentive by increasing certain activities in the body (such as heart rate and blood pressure). These substances may make tinnitus worse.  Take over-the-counter and prescription medicines only as told by your health care provider.  Try to get plenty of sleep each night.  Keep all follow-up visits as told by your health care provider. This is important. Contact a health care provider if:  Your tinnitus continues for 3 weeks or longer without stopping.  You develop sudden hearing loss.  Your symptoms get worse or do not get better with home care.  You feel you are not able to manage the stress of living with tinnitus. Get help right away if:  You develop tinnitus after a head injury.  You have tinnitus along with any  of the following: ? Dizziness. ? Loss of balance. ? Nausea and vomiting. ? Sudden, severe headache. These symptoms may represent a serious problem that is an emergency. Do not wait to see if the symptoms will go away. Get medical help right away. Call your local emergency services (911 in the [place] Do not drive yourself to the hospital. Summary  Tinnitus refers to hearing a sound when there is no actual source for that sound. This is often described as ringing in the ears.  Symptoms may affect only one ear (unilateral) or both ears (bilateral).  Use a white noise machine, a humidifier, or other devices to mask the sound of tinnitus.  Do not use stimulants, such as nicotine, alcohol, or caffeine. Talk with your health care provider about other stimulants to avoid. These substances may make tinnitus worse. This information is not intended to replace advice given to you by your health care provider. Make sure you discuss any questions you have with your health care provider. Document Revised: 10/08/2018 Document Reviewed: 01/04/2017 Elsevier Patient Education  2021 [employer].

## 2020-05-11 NOTE — Progress Notes (Signed)
Established patient visit   Patient: Alexandria Rodriguez   DOB: Sep 03, 1950   70 y.o. Female  MRN: [id number] Visit Date: 05/11/2020  Today's healthcare provider: Marcille Buffy, FNP   Chief Complaint  Patient presents with  . Tinnitus   Subjective    HPI HPI    Patient presents in office today with complaints of ringing in her ears since 05/09/2020. Patient denies sinus pain or pressure but states that she has had dizziness.    Last edited by Minette Headland, CMA on 05/11/2020 11:56 AM. (History)      Left ear pressure.  Accompanied by husband.  Patient  denies any fever, body aches,chills, rash, chest pain, shortness of breath, nausea, vomiting, or diarrhea.  Denies any falls. Walks minimally she reports due to chronic weakness, she denies any change in gait as does husband.  Denies any syncope or new onset of weakness.   Denies any new medications or recent illness.  Patient  denies any fever, body aches,chills, rash, chest pain, shortness of breath, nausea, vomiting, or diarrhea.  Denies lightheadedness, pre syncopal or syncopal episodes.   Past Medical History:  Diagnosis Date  . Anxiety   . Cataract   . Cerebrovascular disease   . Chronic pain    back pain  . Contact dermatitis   . COPD (chronic obstructive pulmonary disease) ([place])   . Depression   . Diverticulosis 10/31/10   On Tranverse colon  . GERD (gastroesophageal reflux disease)   . Myasthenia gravis ([place]) 02/2013   Diagnosed by doctor Manuella Ghazi  . Neuropathy   . Osteoporosis   . Parkinson's disease (tremor, stiffness, slow motion, unstable posture) ([place])   . Stroke ([place])   . Vitamin D deficiency    Past Surgical History:  Procedure Laterality Date  . ABDOMINAL HYSTERECTOMY  2000   due to excessive bleeeding  . CERVICAL CONE BIOPSY    . Cervical discectomy and fusion  [facility]  . COLONOSCOPY WITH PROPOFOL N/A 12/06/2015   Procedure: COLONOSCOPY WITH PROPOFOL;   Surgeon: Manya Silvas, MD;  [facility];  Service: Endoscopy;  Laterality: N/A;  . ESOPHAGOGASTRODUODENOSCOPY N/A 10/05/2014   Procedure: ESOPHAGOGASTRODUODENOSCOPY (EGD);  Surgeon: Manya Silvas, MD;  Location: [facility] ENDOSCOPY;  Service: Endoscopy;  Laterality: N/A;  . Repeat cuff surgery  2004   Developed adhesive capsulitis  . ROTATOR CUFF REPAIR Right 2003   Shoulder   Social History   Tobacco Use  . Smoking status: Current Every Day Smoker    Packs/day: 0.75    Years: 42.00    Pack years: 31.50    Types: Cigarettes  . Smokeless tobacco: Never Used  . Tobacco comment:  Has been smoking for 30+ years, Cigarettes per day;10.  Vaping Use  . Vaping Use: Never used  Substance Use Topics  . Alcohol use: No    Alcohol/week: 0.0 standard drinks  . Drug use: No   Social History   Socioeconomic History  . Marital status: Married    Spouse name: Jaquelyn Bitter  . Number of children: 3  . Years of education: Not on file  . Highest education level: Some college, no degree  Occupational History  . Occupation: disabled  Tobacco Use  . Smoking status: Current Every Day Smoker    Packs/day: 0.75    Years: 42.00    Pack years: 31.50    Types: Cigarettes  . Smokeless tobacco: Never Used  . Tobacco comment:  Has been smoking for 30+ years, Cigarettes per day;10.  Vaping Use  . Vaping Use: Never used  Substance and Sexual Activity  . Alcohol use: No    Alcohol/week: 0.0 standard drinks  . Drug use: No  . Sexual activity: Not on file  Other Topics Concern  . Not on file  Social History Narrative   Ambulates with walker rollator or using assitance holding on to walls or solid surfaces.    Social Determinants of Health   Financial Resource Strain: Low Risk   . Difficulty of Paying Living Expenses: Not hard at all  Food Insecurity: No Food Insecurity  . Worried About Charity fundraiser in the Last Year: Never true  . Ran Out of Food in the Last Year: Never true   Transportation Needs: No Transportation Needs  . Lack of Transportation (Medical): No  . Lack of Transportation (Non-Medical): No  Physical Activity: Inactive  . Days of Exercise per Week: 0 days  . Minutes of Exercise per Session: 0 min  Stress: No Stress Concern Present  . Feeling of Stress : Not at all  Social Connections: Moderately Isolated  . Frequency of Communication with Friends and Family: Three times a week  . Frequency of Social Gatherings with Friends and Family: Never  . Attends Religious Services: Never  . Active Member of Clubs or Organizations: No  . Attends Archivist Meetings: Never  . Marital Status: Married  Human resources officer Violence: Not At Risk  . Fear of Current or Ex-Partner: No  . Emotionally Abused: No  . Physically Abused: No  . Sexually Abused: No   Family Status  Relation Name Status  . Mother  Deceased at age 47       From complication of colon cancer  . Father  Deceased       Died from a stroke, and heart disease  . Sister  Alive  . Brother  Alive       colon polyps  . MGM  (Not Specified)  . Son  Alive   Family History  Problem Relation Age of Onset  . Colon cancer Mother   . Stroke Father   . Cancer Sister        Ovarian, stable  . Breast cancer Maternal Grandmother    Allergies  Allergen Reactions  . Effexor Xr [Venlafaxine Hcl Er] Other (See Comments)    Reaction:  GI upset   . Fosamax [Alendronate Sodium] Nausea And Vomiting  . Percocet [Oxycodone-Acetaminophen] Swelling  . Erythromycin Swelling and Rash    Pt states that her tongue swells.    Katrinka Blazing [Lidocaine] Swelling and Rash  . Morphine And Related Swelling, Rash and Other (See Comments)    Pt states that her tongue swells.   Pt states that her tongue swells.    . Sulfa Antibiotics Swelling and Rash    TONGUE SWELLING  . Sulfasalazine Rash and Swelling  . Tramadol Swelling and Rash       Medications: Outpatient Medications Prior to Visit   Medication Sig  . acetaminophen (TYLENOL) 325 MG tablet Take 2 tablets (650 mg total) by mouth every 6 (six) hours as needed for mild pain (or Fever >/= 101). (Patient not taking: Reported on 08/04/2019)  . carisoprodol (SOMA) 350 MG tablet TAKE 1 TABLET BY MOUTH TWICE DAILY AS NEEDED  . ENSTILAR 0.005-0.064 % FOAM Apply  a small amount to skin as directed  qd up to 5 days per week to aa  psoriasis on body, avoid face, groin, axilla (Patient not taking: Reported on 12/11/2019)  . folic acid (FOLVITE) 1 MG tablet Take 1 mg by mouth daily. (Patient not taking: Reported on 03/12/2020)  . gabapentin (NEURONTIN) 100 MG capsule Take 2 capsules (200 mg total) by mouth 3 (three) times daily. Take in addition to 600mg  gabapentin tablets  . gabapentin (NEURONTIN) 600 MG tablet TAKE 1 TABLET(600 MG) BY MOUTH TWICE DAILY  . IBU 400 MG tablet TAKE 1 TABLET BY MOUTH  TWICE DAILY AS NEEDED FOR  BACK PAIN (Patient not taking: Reported on 12/11/2019)  . omeprazole (PRILOSEC) 40 MG capsule TAKE 1 CAPSULE BY MOUTH  DAILY (Patient not taking: Reported on 12/11/2019)  . rosuvastatin (CRESTOR) 10 MG tablet Take 1 tablet (10 mg total) by mouth daily.  Marland Kitchen triamcinolone ointment (KENALOG) 0.1 % APPLY EXTERNALLY TO THE AFFECTED AREA TWICE DAILY AS NEEDED   No facility-administered medications prior to visit.    Review of Systems  Constitutional: Positive for fatigue. Negative for activity change, appetite change, chills, diaphoresis, fever and unexpected weight change.  HENT: Negative.   Respiratory: Negative.   Cardiovascular: Negative.   Gastrointestinal: Negative.   Genitourinary: Negative.   Musculoskeletal: Positive for arthralgias.  Neurological: Positive for dizziness and weakness (chronic no change. ). Negative for tremors, seizures, syncope, facial asymmetry, speech difficulty, light-headedness, numbness and headaches.  Psychiatric/Behavioral: Negative.     Last CBC Lab Results  Component Value Date   WBC 5.1  03/12/2020   HGB 12.7 03/12/2020   HCT 37.0 03/12/2020   MCV 98 (H) 03/12/2020   MCH 33.8 (H) 03/12/2020   RDW 12.7 03/12/2020   PLT 200 [id number]   Last metabolic panel Lab Results  Component Value Date   GLUCOSE 86 03/12/2020   NA 139 03/12/2020   K 4.3 03/12/2020   CL 104 03/12/2020   CO2 23 03/12/2020   BUN 6 (L) 03/12/2020   CREATININE 0.81 03/12/2020   GFRNONAA 74 03/12/2020   GFRAA 86 03/12/2020   CALCIUM 8.9 03/12/2020   PROT 6.5 03/12/2020   ALBUMIN 4.2 03/12/2020   LABGLOB 2.3 03/12/2020   AGRATIO 1.8 03/12/2020   BILITOT 0.5 03/12/2020   ALKPHOS 97 03/12/2020   AST 24 03/12/2020   ALT 27 03/12/2020   ANIONGAP 7 08/29/2018   Last lipids Lab Results  Component Value Date   CHOL 118 03/12/2020   HDL 60 03/12/2020   LDLCALC 45 03/12/2020   TRIG 57 03/12/2020   CHOLHDL 2.0 03/12/2020   Last hemoglobin A1c No results found for: HGBA1C Last thyroid functions Lab Results  Component Value Date   TSH 1.40 07/23/2014   Last vitamin D Lab Results  Component Value Date   VD25OH 4.8 (L) 03/12/2020   Last vitamin B12 and Folate Lab Results  Component Value Date   VITAMINB12 207 (L) 03/12/2020   FOLATE <2.0 (L) 06/29/2015     Objective    BP 113/65   Pulse 71   Resp 16   Wt 109 lb 9.6 oz (49.7 kg)   SpO2 99%   BMI 20.05 kg/m  BP Readings from Last 3 Encounters:  05/11/20 113/65  03/12/20 (!) 109/52  05/19/19 117/77   Wt Readings from Last 3 Encounters:  05/11/20 109 lb 9.6 oz (49.7 kg)  03/12/20 107 lb (48.5 kg)  12/08/19 103 lb (46.7 kg)       Physical Exam Vitals reviewed.  Constitutional:      General: She is not in acute distress.  Appearance: She is well-developed. She is not diaphoretic.     Interventions: She is not intubated.    Comments: Seated in wheelchair and alert and oriented x 3  HENT:     Head: Normocephalic and atraumatic.     Jaw: There is normal jaw occlusion.     Salivary Glands: Right salivary gland is not  diffusely enlarged or tender. Left salivary gland is not diffusely enlarged or tender.     Right Ear: Hearing, tympanic membrane, ear canal and external ear normal. There is no impacted cerumen. Tympanic membrane is not perforated or erythematous.     Left Ear: No swelling or tenderness. A middle ear effusion is present. There is no impacted cerumen. No mastoid tenderness. Tympanic membrane is erythematous. Tympanic membrane is not perforated.     Nose: Nose normal.     Mouth/Throat:     Pharynx: No oropharyngeal exudate.  Eyes:     General: Lids are normal. No scleral icterus.       Right eye: No discharge.        Left eye: No discharge.     Conjunctiva/sclera: Conjunctivae normal.     Right eye: Right conjunctiva is not injected. No exudate or hemorrhage.    Left eye: Left conjunctiva is not injected. No exudate or hemorrhage.    Pupils: Pupils are equal, round, and reactive to light.  Neck:     Thyroid: No thyroid mass or thyromegaly.     Vascular: Normal carotid pulses. No carotid bruit, hepatojugular reflux or JVD.     Trachea: Trachea and phonation normal. No tracheal tenderness or tracheal deviation.     Meningeal: Brudzinski's sign and Kernig's sign absent.  Cardiovascular:     Rate and Rhythm: Normal rate and regular rhythm.     Pulses: Normal pulses.          Radial pulses are 2+ on the right side and 2+ on the left side.       Dorsalis pedis pulses are 2+ on the right side and 2+ on the left side.       Posterior tibial pulses are 2+ on the right side and 2+ on the left side.     Heart sounds: Normal heart sounds, S1 normal and S2 normal. Heart sounds not distant. No murmur heard. No friction rub. No gallop.   Pulmonary:     Effort: Pulmonary effort is normal. No tachypnea, bradypnea, accessory muscle usage or respiratory distress. She is not intubated.     Breath sounds: Normal breath sounds. No stridor. No wheezing or rales.  Chest:     Chest wall: No tenderness.   Breasts:     Right: No supraclavicular adenopathy.     Left: No supraclavicular adenopathy.    Abdominal:     General: Bowel sounds are normal. There is no distension or abdominal bruit.     Palpations: Abdomen is soft. There is no shifting dullness, fluid wave, hepatomegaly, splenomegaly, mass or pulsatile mass.     Tenderness: There is no abdominal tenderness. There is no guarding or rebound.     Hernia: No hernia is present.  Musculoskeletal:        General: No tenderness or deformity. Normal range of motion.     Cervical back: Full passive range of motion without pain, normal range of motion and neck supple. No edema, erythema or rigidity. No spinous process tenderness or muscular tenderness. Normal range of motion.  Lymphadenopathy:     Head:  Right side of head: No submental, submandibular, tonsillar, preauricular, posterior auricular or occipital adenopathy.     Left side of head: No submental, submandibular, tonsillar, preauricular, posterior auricular or occipital adenopathy.     Cervical: No cervical adenopathy.     Right cervical: No superficial, deep or posterior cervical adenopathy.    Left cervical: No superficial, deep or posterior cervical adenopathy.     Upper Body:     Right upper body: No supraclavicular or pectoral adenopathy.     Left upper body: No supraclavicular or pectoral adenopathy.  Skin:    General: Skin is warm and dry.     Coloration: Skin is not pale.     Findings: No abrasion, bruising, burn, ecchymosis, erythema, lesion, petechiae or rash.     Nails: There is no clubbing.  Neurological:     General: No focal deficit present.     Mental Status: She is alert and oriented to person, place, and time.     GCS: GCS eye subscore is 4. GCS verbal subscore is 5. GCS motor subscore is 6.     Cranial Nerves: Cranial nerves are intact. No cranial nerve deficit.     Sensory: Sensation is intact. No sensory deficit.     Motor: Motor function is intact. No  tremor, atrophy, abnormal muscle tone or seizure activity.     Coordination: Coordination is intact. Coordination normal.     Gait: Gait is intact. Gait normal.     Deep Tendon Reflexes: Reflexes are normal and symmetric. Reflexes normal. Babinski sign absent on the right side. Babinski sign absent on the left side.     Reflex Scores:      Tricep reflexes are 2+ on the right side and 2+ on the left side.      Brachioradialis reflexes are 2+ on the right side and 2+ on the left side.      Achilles reflexes are 2+ on the right side and 2+ on the left side. Psychiatric:        Speech: Speech normal.        Behavior: Behavior normal.        Thought Content: Thought content normal.        Judgment: Judgment normal.       No results found for any visits on 05/11/20.  Assessment & Plan    Acute bacterial otitis media, left - Plan: CBC with Differential/Platelet, Comprehensive Metabolic Panel (CMET), Z61 and Folate Panel, VITAMIN D 25 Hydroxy (Vit-D Deficiency, Fractures), TSH  Eustachian tube dysfunction, left  Labs today.  Meds ordered this encounter  Medications  . amoxicillin (AMOXIL) 875 MG tablet    Sig: Take 1 tablet (875 mg total) by mouth 2 (two) times daily.    Dispense:  20 tablet    Refill:  0  . cetirizine (ZYRTEC) 10 MG tablet    Sig: Take 1 tablet (10 mg total) by mouth daily.    Dispense:  30 tablet    Refill:  0    Red Flags discussed. The patient was given clear instructions to go to ER or return to medical center if any red flags develop, symptoms do not improve, worsen or new problems develop. They verbalized understanding.   Return in about 2 weeks (around 05/25/2020), or if symptoms worsen or fail to improve, for at any time for any worsening symptoms, Go to Emergency room/ urgent care if worse.     Red Flags discussed. The patient was given clear instructions to  go to ER or return to medical center if any red flags develop, symptoms do not improve, worsen or  new problems develop. They verbalized understanding.    Marcille Buffy, [place] [phone] (phone) [phone] (fax)  [place]

## 2020-05-12 ENCOUNTER — Other Ambulatory Visit: Payer: Self-pay | Admitting: Adult Health

## 2020-05-12 DIAGNOSIS — E559 Vitamin D deficiency, unspecified: Secondary | ICD-10-CM

## 2020-05-12 LAB — COMPREHENSIVE METABOLIC PANEL
ALT: 18 IU/L (ref 0–32)
AST: 25 IU/L (ref 0–40)
Albumin/Globulin Ratio: 2 (ref 1.2–2.2)
Albumin: 4.1 g/dL (ref 3.8–4.8)
Alkaline Phosphatase: 87 IU/L (ref 44–121)
BUN/Creatinine Ratio: 14 (ref 12–28)
BUN: 11 mg/dL (ref 8–27)
Bilirubin Total: 0.4 mg/dL (ref 0.0–1.2)
CO2: 25 mmol/L (ref 20–29)
Calcium: 8.4 mg/dL — ABNORMAL LOW (ref 8.7–10.3)
Chloride: 106 mmol/L (ref 96–106)
Creatinine, Ser: 0.79 mg/dL (ref 0.57–1.00)
GFR calc Af Amer: 88 mL/min/{1.73_m2} (ref >59)
GFR calc non Af Amer: 77 mL/min/{1.73_m2} (ref >59)
Globulin, Total: 2.1 g/dL (ref 1.5–4.5)
Glucose: 96 mg/dL (ref 65–99)
Potassium: 4 mmol/L (ref 3.5–5.2)
Sodium: 142 mmol/L (ref 134–144)
Total Protein: 6.2 g/dL (ref 6.0–8.5)

## 2020-05-12 LAB — CBC WITH DIFFERENTIAL/PLATELET
Basophils Absolute: 0.1 10*3/uL (ref 0.0–0.2)
Basos: 1 %
EOS (ABSOLUTE): 0.5 10*3/uL — ABNORMAL HIGH (ref 0.0–0.4)
Eos: 8 %
Hematocrit: 37.3 % (ref 34.0–46.6)
Hemoglobin: 12.6 g/dL (ref 11.1–15.9)
Immature Grans (Abs): 0 10*3/uL (ref 0.0–0.1)
Immature Granulocytes: 0 %
Lymphocytes Absolute: 1.6 10*3/uL (ref 0.7–3.1)
Lymphs: 29 %
MCH: 32.8 pg (ref 26.6–33.0)
MCHC: 33.8 g/dL (ref 31.5–35.7)
MCV: 97 fL (ref 79–97)
Monocytes Absolute: 0.5 10*3/uL (ref 0.1–0.9)
Monocytes: 9 %
Neutrophils Absolute: 2.9 10*3/uL (ref 1.4–7.0)
Neutrophils: 53 %
Platelets: 176 10*3/uL (ref 150–450)
RBC: 3.84 x10E6/uL (ref 3.77–5.28)
RDW: 12.5 % (ref 11.7–15.4)
WBC: 5.6 10*3/uL (ref 3.4–10.8)

## 2020-05-12 LAB — TSH: TSH: 1.19 u[IU]/mL (ref 0.450–4.500)

## 2020-05-12 LAB — B12 AND FOLATE PANEL
Folate: 3.7 ng/mL (ref >3.0)
Vitamin B-12: 208 pg/mL — ABNORMAL LOW (ref 232–1245)

## 2020-05-12 LAB — VITAMIN D 25 HYDROXY (VIT D DEFICIENCY, FRACTURES): Vit D, 25-Hydroxy: 4.9 ng/mL — ABNORMAL LOW (ref 30.0–100.0)

## 2020-05-12 NOTE — Progress Notes (Signed)
B12 and vitamin D is still low : please verify how she is taking  see note below:  TSH for thyroid okay and CBC/ CMP   Dr Caryn Section last note as follows- please see which she is doing.( Vitamin d level is extremely low, b12 levels are also low.  If she is taking a vitamin d3 supplement then she needs to double it, if not then she needs to start 2000 units vitamin vitamin d3 daily If she is taking vitamin b12 supplement she needs to double it, if not then she needs to start taking 1000 mg b12 once a day. )

## 2020-05-20 ENCOUNTER — Other Ambulatory Visit: Payer: Self-pay | Admitting: Family Medicine

## 2020-05-20 DIAGNOSIS — I700 Atherosclerosis of aorta: Secondary | ICD-10-CM

## 2020-05-20 NOTE — Telephone Encounter (Signed)
Future visit in 1 week  

## 2020-05-27 NOTE — Progress Notes (Signed)
Established patient visit   Patient: Alexandria Rodriguez   DOB: 23-Aug-1950   70 y.o. Female  MRN: [id number] Visit Date: 05/28/2020  Today's healthcare provider: Lelon Huh, MD   Chief Complaint  Patient presents with   Follow-up   Subjective    HPI  Follow up for acute bacterial left otitis media:  The patient was last seen for this 05/11/2020 (seen by Laverna Peace, FNP).   Changes made at last visit include treating with Amoxicillin 875mg  and Zyrtec. Labs were ordered.  She reports good compliance with treatment. She feels that condition is Unchanged.She reports symptoms improved when she was taking the antibiotic, but returned when she finished taking it.  She is not having side effects. Patient reports having dizziness.  -----------------------------------------------------------------------------------------  Follow up for Vitamin B12 Deficiency:  The patient was last seen for this 2 weeks ago(seen by Laverna Peace, FNP). . Changes made at the previous visit in December include starting Vitamin B12 1000 mg daily. Lab Results  Component Value Date   VITAMINB12 208 (L) 05/11/2020   She reports good compliance with treatment. She feels that condition is Unchanged. She is not having side effects.  She previously required regular b12 injections.  -----------------------------------------------------------------------------------------   Follow up for Vitamin D Deficiency:  The patient was last seen for this 2 weeks ago (seen by Laverna Peace, FNP).  Lab Results  Component Value Date   VD25OH 4.9 (L) 05/11/2020    Changes made at the previous visit in December was starting Vitamin d 2000 mg daily.  She reports good compliance with treatment. She feels that condition is Unchanged. She is not having side effects.   -----------------------------------------------------------------------------------------  Speech impairment She also reports 5 days  ago having an episode of speech impairment. She says she was unable to get get words out as she usually does. She believes she may have had a mini-stroke. She has history of CVA with old lacunar infarct on brain CT in 2015. She is no longer taking aspirin, or any other anticoagulants or antiplatelet medications. The speech impairment lasted for 2 days. She did not seek medical treatment of these symptoms, but states they have now completely resolved.       Medications: Outpatient Medications Prior to Visit  Medication Sig   acetaminophen (TYLENOL) 325 MG tablet Take 2 tablets (650 mg total) by mouth every 6 (six) hours as needed for mild pain (or Fever >/= 101).   carisoprodol (SOMA) 350 MG tablet TAKE 1 TABLET BY MOUTH TWICE DAILY AS NEEDED   cetirizine (ZYRTEC) 10 MG tablet Take 1 tablet (10 mg total) by mouth daily.   Cholecalciferol (VITAMIN D3) 50 MCG (2000 UT) CAPS Take 1 capsule by mouth daily.   ENSTILAR 0.005-0.064 % FOAM Apply  a small amount to skin as directed  qd up to 5 days per week to aa psoriasis on body, avoid face, groin, axilla   folic acid (FOLVITE) 1 MG tablet Take 1 mg by mouth daily.   gabapentin (NEURONTIN) 100 MG capsule Take 2 capsules (200 mg total) by mouth 3 (three) times daily. Take in addition to 600mg  gabapentin tablets   gabapentin (NEURONTIN) 600 MG tablet TAKE 1 TABLET(600 MG) BY MOUTH TWICE DAILY   IBU 400 MG tablet TAKE 1 TABLET BY MOUTH  TWICE DAILY AS NEEDED FOR  BACK PAIN   omeprazole (PRILOSEC) 40 MG capsule TAKE 1 CAPSULE BY MOUTH  DAILY   rosuvastatin (CRESTOR) 10 MG tablet TAKE 1  TABLET(10 MG) BY MOUTH DAILY   triamcinolone ointment (KENALOG) 0.1 % APPLY EXTERNALLY TO THE AFFECTED AREA TWICE DAILY AS NEEDED   vitamin B-12 (CYANOCOBALAMIN) 1000 MCG tablet Take 1,000 mcg by mouth daily.   amoxicillin (AMOXIL) 875 MG tablet Take 1 tablet (875 mg total) by mouth 2 (two) times daily. (Patient not taking: Reported on 05/28/2020)   No  facility-administered medications prior to visit.    Review of Systems  Constitutional: Negative for appetite change, chills, fatigue and fever.  Respiratory: Negative for chest tightness and shortness of breath.   Cardiovascular: Negative for chest pain and palpitations.  Gastrointestinal: Negative for abdominal pain, nausea and vomiting.  Neurological: Positive for dizziness, tremors and speech difficulty. Negative for weakness.       Objective    BP 106/67 (BP Location: Left Arm, Patient Position: Sitting, Cuff Size: Normal)    Pulse 76    Temp (!) 97.5 F (36.4 C) (Temporal)    Resp 16    Wt 108 lb (49 kg)    SpO2 98% Comment: room air   BMI 19.75 kg/m     Physical Exam   General: Appearance:    Thin female in no acute distress  Eyes:    PERRL, conjunctiva/corneas clear, EOM's intact       ENT:     Lungs:     Clear to auscultation bilaterally, respirations unlabored  Heart:    Normal heart rate. Normal rhythm. No murmurs, rubs, or gallops.   MS:   All extremities are intact.   Neurologic:   Awake, alert, oriented x 3. Normal speech and normal though processes.         Assessment & Plan     1. Transient neurologic deficit Concerning for new ischemic event.   - ECHOCARDIOGRAM COMPLETE; Future - US Carotid Duplex Bilateral; Future - CT Head Wo Contrast; Future  2. TIA (transient ischemic attack) start- clopidogrel (PLAVIX) 75 MG tablet; Take 1 tablet (75 mg total) by mouth daily.  Dispense: 30 tablet; Refill: 3  - CT Head Wo Contrast; Future  3. B12 deficiency Sill low on oral- vitamin B-12 (CYANOCOBALAMIN) 1000 MCG tablet; Take 1,000 mcg by mouth daily.  Restart monthly cyanocobalamin ((VITAMIN B-12)) injection 1,000 mcg Follow up for injection 1 month.   4. Vitamin D deficiency No improvement on 2000 units daily vitamin D. Double to 2 x 2000 units daily.  - Cholecalciferol (VITAMIN D3) 50 MCG (2000 UT) CAPS; Take 2 capsules (4,000 Units total) by mouth  daily.  5. Eustachian tube dysfunction, left No improvement after finishing antibiotic for sinuses and starting cetirizine.  - fluticasone (FLONASE) 50 MCG/ACT nasal spray; Place 2 sprays into both nostrils daily.  Dispense: 16 g; Refill: 3         The entirety of the information documented in the History of Present Illness, Review of Systems and Physical Exam were personally obtained by me. Portions of this information were initially documented by the CMA and reviewed by me for thoroughness and accuracy.      Lelon Huh, MD  [facility] [phone] (phone) [phone] (fax)  [place]

## 2020-05-28 ENCOUNTER — Encounter: Payer: Self-pay | Admitting: Family Medicine

## 2020-05-28 ENCOUNTER — Ambulatory Visit (INDEPENDENT_AMBULATORY_CARE_PROVIDER_SITE_OTHER): Payer: Medicare Other | Admitting: Family Medicine

## 2020-05-28 ENCOUNTER — Other Ambulatory Visit: Payer: Self-pay

## 2020-05-28 VITALS — BP 106/67 | HR 76 | Temp 97.5°F | Resp 16 | Wt 108.0 lb

## 2020-05-28 DIAGNOSIS — E559 Vitamin D deficiency, unspecified: Secondary | ICD-10-CM

## 2020-05-28 DIAGNOSIS — H6982 Other specified disorders of Eustachian tube, left ear: Secondary | ICD-10-CM

## 2020-05-28 DIAGNOSIS — E538 Deficiency of other specified B group vitamins: Secondary | ICD-10-CM

## 2020-05-28 DIAGNOSIS — R29818 Other symptoms and signs involving the nervous system: Secondary | ICD-10-CM | POA: Diagnosis not present

## 2020-05-28 DIAGNOSIS — G459 Transient cerebral ischemic attack, unspecified: Secondary | ICD-10-CM

## 2020-05-28 MED ORDER — CLOPIDOGREL BISULFATE 75 MG PO TABS: 75.0000 mg | ORAL_TABLET | Freq: Every day | ORAL | 3 refills | Status: DC

## 2020-05-28 MED ORDER — CYANOCOBALAMIN 1000 MCG/ML IJ SOLN
1000.0000 ug | Freq: Once | INTRAMUSCULAR | Status: AC
  Administered 2020-05-28: 1000 ug via INTRAMUSCULAR

## 2020-05-28 MED ORDER — VITAMIN D (CHOLECALCIFEROL) 50 MCG (2000 UT) PO CAPS: 2.0000 | ORAL_CAPSULE | Freq: Every day | ORAL | Status: DC

## 2020-05-28 MED ORDER — FLUTICASONE PROPIONATE 50 MCG/ACT NA SUSP
2.0000 | Freq: Every day | NASAL | 3 refills | Status: DC
Start: 2020-05-28 — End: 2022-10-27

## 2020-05-28 NOTE — Patient Instructions (Addendum)
.   Please review the attached list of medications and notify my office if there are any errors.   . Double the dose of vitamin D3 that your are taking to 2 (two) capsules every day

## 2020-06-01 ENCOUNTER — Other Ambulatory Visit: Payer: Self-pay | Admitting: Family Medicine

## 2020-06-09 ENCOUNTER — Ambulatory Visit
Admission: RE | Admit: 2020-06-09 | Discharge: 2020-06-09 | Disposition: A | Discharge status: Home / Self Care | Payer: Medicare Other | Source: Ambulatory Visit | Attending: Family Medicine | Admitting: Family Medicine

## 2020-06-09 ENCOUNTER — Other Ambulatory Visit: Payer: Self-pay

## 2020-06-09 DIAGNOSIS — G459 Transient cerebral ischemic attack, unspecified: Secondary | ICD-10-CM | POA: Insufficient documentation

## 2020-06-09 DIAGNOSIS — R29818 Other symptoms and signs involving the nervous system: Secondary | ICD-10-CM | POA: Diagnosis not present

## 2020-06-09 DIAGNOSIS — I639 Cerebral infarction, unspecified: Secondary | ICD-10-CM | POA: Diagnosis not present

## 2020-06-11 ENCOUNTER — Telehealth: Payer: Self-pay

## 2020-06-11 DIAGNOSIS — I700 Atherosclerosis of aorta: Secondary | ICD-10-CM

## 2020-06-11 DIAGNOSIS — I480 Paroxysmal atrial fibrillation: Secondary | ICD-10-CM

## 2020-06-11 MED ORDER — ROSUVASTATIN CALCIUM 20 MG PO TABS: 20.0000 mg | ORAL_TABLET | Freq: Every day | ORAL | 5 refills | Status: DC

## 2020-06-11 NOTE — Telephone Encounter (Signed)
-----   Message from Birdie Sons, MD sent at 06/10/2020  9:30 AM EST ----- CT confirms that she has had a recent stroke. She needs to continue taking the clopidogrel that was prescribed. She needs to increase rosuvastatin to 20mg  daily, #30, rf x 5. She also needs referral to cardiology for Holter monitor to rule out intermittent atrial fibrillation.

## 2020-06-11 NOTE — Telephone Encounter (Signed)
Pt called and verbalized understanding of information below. Rx has been sent in and referral has been placed.

## 2020-06-15 ENCOUNTER — Other Ambulatory Visit: Payer: Self-pay

## 2020-06-15 ENCOUNTER — Encounter: Payer: Self-pay | Admitting: Cardiology

## 2020-06-15 ENCOUNTER — Ambulatory Visit: Payer: Medicare Other | Admitting: Cardiology

## 2020-06-15 ENCOUNTER — Ambulatory Visit (INDEPENDENT_AMBULATORY_CARE_PROVIDER_SITE_OTHER): Payer: Medicare Other

## 2020-06-15 VITALS — BP 104/78 | HR 78 | Ht 62.0 in | Wt 109.0 lb

## 2020-06-15 DIAGNOSIS — F17200 Nicotine dependence, unspecified, uncomplicated: Secondary | ICD-10-CM

## 2020-06-15 DIAGNOSIS — IMO0001 Reserved for inherently not codable concepts without codable children: Secondary | ICD-10-CM

## 2020-06-15 DIAGNOSIS — I639 Cerebral infarction, unspecified: Secondary | ICD-10-CM | POA: Diagnosis not present

## 2020-06-15 NOTE — Patient Instructions (Signed)
Medication Instructions:  Your physician recommends that you continue on your current medications as directed. Please refer to the Current Medication list given to you today.  *If you need a refill on your cardiac medications before your next appointment, please call your pharmacy*   Lab Work: None ordered If you have labs (blood work) drawn today and your tests are completely normal, you will receive your results only by: Marland Kitchen MyChart Message (if you have MyChart) OR . A paper copy in the mail If you have any lab test that is abnormal or we need to change your treatment, we will call you to review the results.   Testing/Procedures:  1.  Your physician has recommended that you wear a Zio monitor for 2 weeks. This monitor is a medical device that records the heart's electrical activity. Doctors most often use these monitors to diagnose arrhythmias. Arrhythmias are problems with the speed or rhythm of the heartbeat. The monitor is a small device applied to your chest. You can wear one while you do your normal daily activities. While wearing this monitor if you have any symptoms to push the button and record what you felt. Once you have worn this monitor for the period of time provider prescribed (Usually 14 days), you will return the monitor device in the postage paid box. Once it is returned they will download the data collected and provide [place] with a report which the provider will then review and we will call you with those results. Important tips:  1. Avoid showering during the first 24 hours of wearing the monitor. 2. Avoid excessive sweating to help maximize wear time. 3. Do not submerge the device, no hot tubs, and no swimming pools. 4. Keep any lotions or oils away from the patch. 5. After 24 hours you may shower with the patch on. Take brief showers with your back facing the shower head.  6. Do not remove patch once it has been placed because that will interrupt data and decrease adhesive  wear time. 7. Push the button when you have any symptoms and write down what you were feeling. 8. Once you have completed wearing your monitor, remove and place into box which has postage paid and place in your outgoing mailbox.  9. If for some reason you have misplaced your box then call our office and we can provide another box and/or mail it off for you.         Follow-Up: At [facility], you and your health needs are our priority.  As part of our continuing mission to provide you with exceptional heart care, we have created designated Provider Care Teams.  These Care Teams include your primary Cardiologist (physician) and Advanced Practice Providers (APPs -  Physician Assistants and Nurse Practitioners) who all work together to provide you with the care you need, when you need it.  We recommend signing up for the patient portal called "MyChart".  Sign up information is provided on this After Visit Summary.  MyChart is used to connect with patients for Virtual Visits (Telemedicine).  Patients are able to view lab/test results, encounter notes, upcoming appointments, etc.  Non-urgent messages can be sent to your provider as well.   To learn more about what you can do with MyChart, go to [email or web address].    Your next appointment:   5 week(s)  The format for your next appointment:   In Person  Provider:   Kate Sable, MD   Other Instructions

## 2020-06-15 NOTE — Progress Notes (Signed)
Cardiology Office Note:    Date:  06/15/2020   ID:  Alexandria Rodriguez, DOB 11/21/1950, MRN [id number]  PCP:  Birdie Sons, MD   [place]  Cardiologist:  Kate Sable, MD  Advanced Practice Provider:  No care team member to display Electrophysiologist:  None       Referring MD: Birdie Sons, MD   Chief Complaint  Patient presents with  . Other    Intermittent AFIB Hx Stroke. Meds reviewed verbally with pt.    History of Present Illness:    Alexandria Rodriguez is a 70 y.o. female with a hx of anxiety, current smoker x40+ years, CVA (2015,2022),  COPD, tremors who presents due to history of CVA.  Patient had speech impediment last month with symptoms of unable to get words out.  Symptoms lasted roughly 2 days and completely resolved.  Did not seek medical care.  Followed up with primary care provider who obtained a head CT after patient mentioned symptoms.  Head CT 06/09/2020 showed acute to subacute left frontal infarct.  She was referred to cardiology to evaluate presence of atrial fib or flutter due to history of multiple strokes.  She denies any history of palpitations, shortness of breath, chest pain.  Denies any history of heart disease.  She still smokes.  Has appointment to see neurology.  Started on Plavix after recent stroke.  Past Medical History:  Diagnosis Date  . Anxiety   . Cataract   . Cerebrovascular disease   . Chronic pain    back pain  . Contact dermatitis   . COPD (chronic obstructive pulmonary disease) ([place])   . Depression   . Diverticulosis 10/31/10   On Tranverse colon  . GERD (gastroesophageal reflux disease)   . Hyperlipidemia   . Myasthenia gravis ([place]) 02/2013   Diagnosed by doctor Manuella Ghazi  . Neuropathy   . Osteoporosis   . Parkinson's disease (tremor, stiffness, slow motion, unstable posture) ([place])   . Stroke ([place])   . Vitamin D deficiency     Past Surgical History:  Procedure Laterality Date  . ABDOMINAL  HYSTERECTOMY  2000   due to excessive bleeeding  . CERVICAL CONE BIOPSY    . Cervical discectomy and fusion  [facility]  . COLONOSCOPY WITH PROPOFOL N/A 12/06/2015   Procedure: COLONOSCOPY WITH PROPOFOL;  Surgeon: Manya Silvas, MD;  Location: [facility] ENDOSCOPY;  Service: Endoscopy;  Laterality: N/A;  . ESOPHAGOGASTRODUODENOSCOPY N/A 10/05/2014   Procedure: ESOPHAGOGASTRODUODENOSCOPY (EGD);  Surgeon: Manya Silvas, MD;  Location[facility] ENDOSCOPY;  Service: Endoscopy;  Laterality: N/A;  . Repeat cuff surgery  2004   Developed adhesive capsulitis  . ROTATOR CUFF REPAIR Right 2003   Shoulder    Current Medications: Current Meds  Medication Sig  . carisoprodol (SOMA) 350 MG tablet TAKE 1 TABLET BY MOUTH TWICE DAILY AS NEEDED  . cetirizine (ZYRTEC) 10 MG tablet Take 1 tablet (10 mg total) by mouth daily.  . Cholecalciferol (VITAMIN D3) 50 MCG (2000 UT) CAPS Take 2 capsules (4,000 Units total) by mouth daily.  . clopidogrel (PLAVIX) 75 MG tablet Take 75 mg by mouth daily.  . ENSTILAR 0.005-0.064 % FOAM Apply  a small amount to skin as directed  qd up to 5 days per week to aa psoriasis on body, avoid face, groin, axilla  . fluticasone (FLONASE) 50 MCG/ACT nasal spray Place 2 sprays into both nostrils daily.  Marland Kitchen gabapentin (NEURONTIN) 100 MG capsule  TAKE 2 CAPSULES(200 MG) BY MOUTH THREE TIMES DAILY IN ADDITION TO 600 MG GABAPENTIN TABLETS  . gabapentin (NEURONTIN) 600 MG tablet TAKE 1 TABLET(600 MG) BY MOUTH TWICE DAILY  . rosuvastatin (CRESTOR) 20 MG tablet Take 1 tablet (20 mg total) by mouth daily.  Marland Kitchen triamcinolone ointment (KENALOG) 0.1 % APPLY EXTERNALLY TO THE AFFECTED AREA TWICE DAILY AS NEEDED     Allergies:   Effexor xr [venlafaxine hcl er], Fosamax [alendronate sodium], Percocet [oxycodone-acetaminophen], Erythromycin, Lidoderm [lidocaine], Morphine and related, Sulfa antibiotics, Sulfasalazine, and Tramadol   Social History   Socioeconomic History  .  Marital status: Married    Spouse name: Jaquelyn Bitter  . Number of children: 3  . Years of education: Not on file  . Highest education level: Some college, no degree  Occupational History  . Occupation: disabled  Tobacco Use  . Smoking status: Current Every Day Smoker    Packs/day: 0.75    Years: 42.00    Pack years: 31.50    Types: Cigarettes  . Smokeless tobacco: Never Used  . Tobacco comment:  Has been smoking for 30+ years, Cigarettes per day;10.  Vaping Use  . Vaping Use: Never used  Substance and Sexual Activity  . Alcohol use: No    Alcohol/week: 0.0 standard drinks  . Drug use: No  . Sexual activity: Not on file  Other Topics Concern  . Not on file  Social History Narrative   Ambulates with walker rollator or using assitance holding on to walls or solid surfaces.    Social Determinants of Health   Financial Resource Strain: Low Risk   . Difficulty of Paying Living Expenses: Not hard at all  Food Insecurity: No Food Insecurity  . Worried About Charity fundraiser in the Last Year: Never true  . Ran Out of Food in the Last Year: Never true  Transportation Needs: No Transportation Needs  . Lack of Transportation (Medical): No  . Lack of Transportation (Non-Medical): No  Physical Activity: Inactive  . Days of Exercise per Week: 0 days  . Minutes of Exercise per Session: 0 min  Stress: No Stress Concern Present  . Feeling of Stress : Not at all  Social Connections: Moderately Isolated  . Frequency of Communication with Friends and Family: Three times a week  . Frequency of Social Gatherings with Friends and Family: Never  . Attends Religious Services: Never  . Active Member of Clubs or Organizations: No  . Attends Archivist Meetings: Never  . Marital Status: Married     Family History: The patient's family history includes Breast cancer in her maternal grandmother; Cancer in her sister; Colon cancer in her mother; Stroke in her father.  ROS:   Please see  the history of present illness.     All other systems reviewed and are negative.  EKGs/Labs/Other Studies Reviewed:    The following studies were reviewed today:   EKG:  EKG is  ordered today.  The ekg ordered today demonstrates normal sinus rhythm  Recent Labs: 05/11/2020: ALT 18; BUN 11; Creatinine, Ser 0.79; Hemoglobin 12.6; Platelets 176; Potassium 4.0; Sodium 142; TSH 1.190  Recent Lipid Panel    Component Value Date/Time   CHOL 118 03/12/2020 1604   CHOL 138 05/14/2013 0408   TRIG 57 03/12/2020 1604   TRIG 84 05/14/2013 0408   HDL 60 03/12/2020 1604   HDL 36 (L) 05/14/2013 0408   CHOLHDL 2.0 03/12/2020 1604   VLDL 17 05/14/2013 0408   LDLCALC  45 03/12/2020 1604   LDLCALC 85 05/14/2013 0408     Risk Assessment/Calculations:       Physical Exam:    VS:  BP 104/78 (BP Location: Right Arm, Patient Position: Sitting, Cuff Size: Normal)   Pulse 78   Ht 5\' 2"  (1.575 m)   Wt 109 lb (49.4 kg)   SpO2 98%   BMI 19.94 kg/m     Wt Readings from Last 3 Encounters:  06/15/20 109 lb (49.4 kg)  05/28/20 108 lb (49 kg)  05/11/20 109 lb 9.6 oz (49.7 kg)     GEN:  Well nourished, well developed in no acute distress HEENT: Normal NECK: No JVD; No carotid bruits LYMPHATICS: No lymphadenopathy CARDIAC: RRR, no murmurs, rubs, gallops RESPIRATORY:  Clear to auscultation without rales, wheezing or rhonchi  ABDOMEN: Soft, non-tender, non-distended MUSCULOSKELETAL:  No edema; No deformity  SKIN: Warm and dry NEUROLOGIC:  Alert and oriented x 3, resting tremors noted PSYCHIATRIC:  Normal affect   ASSESSMENT:    1. Cerebrovascular accident (CVA), unspecified mechanism ([place])   2. Smoking    PLAN:    In order of problems listed above:  1. Patient with recent CVA, place cardiac monitor to evaluate any arrhythmias such as A. fib or flutter.  Continue Plavix and statin as prescribed.  Keep appointment with neurology for additional work-up input.  Obtain echocardiogram as  scheduled. 2. Current smoker, cessation advised.  Follow-up after echo and cardiac monitor.     Medication Adjustments/Labs and Tests Ordered: Current medicines are reviewed at length with the patient today.  Concerns regarding medicines are outlined above.  Orders Placed This Encounter  Procedures  . LONG TERM MONITOR (3-14 DAYS)  . EKG 12-Lead   No orders of the defined types were placed in this encounter.   Patient Instructions  Medication Instructions:  Your physician recommends that you continue on your current medications as directed. Please refer to the Current Medication list given to you today.  *If you need a refill on your cardiac medications before your next appointment, please call your pharmacy*   Lab Work: None ordered If you have labs (blood work) drawn today and your tests are completely normal, you will receive your results only by: Marland Kitchen MyChart Message (if you have MyChart) OR . A paper copy in the mail If you have any lab test that is abnormal or we need to change your treatment, we will call you to review the results.   Testing/Procedures:  1.  Your physician has recommended that you wear a Zio monitor for 2 weeks. This monitor is a medical device that records the heart's electrical activity. Doctors most often use these monitors to diagnose arrhythmias. Arrhythmias are problems with the speed or rhythm of the heartbeat. The monitor is a small device applied to your chest. You can wear one while you do your normal daily activities. While wearing this monitor if you have any symptoms to push the button and record what you felt. Once you have worn this monitor for the period of time provider prescribed (Usually 14 days), you will return the monitor device in the postage paid box. Once it is returned they will download the data collected and provide [place] with a report which the provider will then review and we will call you with those results. Important  tips:  1. Avoid showering during the first 24 hours of wearing the monitor. 2. Avoid excessive sweating to help maximize wear time. 3. Do not submerge the  device, no hot tubs, and no swimming pools. 4. Keep any lotions or oils away from the patch. 5. After 24 hours you may shower with the patch on. Take brief showers with your back facing the shower head.  6. Do not remove patch once it has been placed because that will interrupt data and decrease adhesive wear time. 7. Push the button when you have any symptoms and write down what you were feeling. 8. Once you have completed wearing your monitor, remove and place into box which has postage paid and place in your outgoing mailbox.  9. If for some reason you have misplaced your box then call our office and we can provide another box and/or mail it off for you.         Follow-Up: At [facility], you and your health needs are our priority.  As part of our continuing mission to provide you with exceptional heart care, we have created designated Provider Care Teams.  These Care Teams include your primary Cardiologist (physician) and Advanced Practice Providers (APPs -  Physician Assistants and Nurse Practitioners) who all work together to provide you with the care you need, when you need it.  We recommend signing up for the patient portal called "MyChart".  Sign up information is provided on this After Visit Summary.  MyChart is used to connect with patients for Virtual Visits (Telemedicine).  Patients are able to view lab/test results, encounter notes, upcoming appointments, etc.  Non-urgent messages can be sent to your provider as well.   To learn more about what you can do with MyChart, go to [email or web address].    Your next appointment:   5 week(s)  The format for your next appointment:   In Person  Provider:   Kate Sable, MD   Other Instructions      Signed, Kate Sable, MD  06/15/2020 9:56 AM    [place]

## 2020-06-17 ENCOUNTER — Other Ambulatory Visit: Payer: Self-pay

## 2020-06-17 ENCOUNTER — Ambulatory Visit
Admission: RE | Admit: 2020-06-17 | Discharge: 2020-06-17 | Disposition: A | Discharge status: Home / Self Care | Payer: Medicare Other | Source: Ambulatory Visit | Attending: Family Medicine | Admitting: Family Medicine

## 2020-06-17 DIAGNOSIS — E785 Hyperlipidemia, unspecified: Secondary | ICD-10-CM | POA: Diagnosis not present

## 2020-06-17 DIAGNOSIS — R29818 Other symptoms and signs involving the nervous system: Secondary | ICD-10-CM | POA: Diagnosis not present

## 2020-06-17 DIAGNOSIS — I6523 Occlusion and stenosis of bilateral carotid arteries: Secondary | ICD-10-CM | POA: Diagnosis not present

## 2020-06-17 DIAGNOSIS — H539 Unspecified visual disturbance: Secondary | ICD-10-CM | POA: Diagnosis not present

## 2020-06-18 ENCOUNTER — Other Ambulatory Visit: Payer: Medicare Other

## 2020-06-18 ENCOUNTER — Encounter: Payer: Self-pay | Admitting: Family Medicine

## 2020-06-18 ENCOUNTER — Ambulatory Visit: Payer: Medicare Other

## 2020-06-18 DIAGNOSIS — I6529 Occlusion and stenosis of unspecified carotid artery: Secondary | ICD-10-CM | POA: Insufficient documentation

## 2020-06-18 DIAGNOSIS — R2689 Other abnormalities of gait and mobility: Secondary | ICD-10-CM | POA: Diagnosis not present

## 2020-06-18 DIAGNOSIS — Z8673 Personal history of transient ischemic attack (TIA), and cerebral infarction without residual deficits: Secondary | ICD-10-CM | POA: Insufficient documentation

## 2020-06-18 DIAGNOSIS — R251 Tremor, unspecified: Secondary | ICD-10-CM | POA: Diagnosis not present

## 2020-06-21 ENCOUNTER — Telehealth: Payer: Self-pay

## 2020-06-21 DIAGNOSIS — I6529 Occlusion and stenosis of unspecified carotid artery: Secondary | ICD-10-CM

## 2020-06-21 NOTE — Telephone Encounter (Signed)
Copied from [place] [id number]. Topic: General - Other >> Jun 21, 2020  3:36 PM Celene Kras wrote: Reason for CRM: Pts husband called and is requesting to have a printed out copy of pts recent imaging. He states that the pt went and saw a doctor in [place] and they are needing to have an overview of this too. Please advise.

## 2020-06-21 NOTE — Telephone Encounter (Signed)
Done. Copy of ultrasound report printed and placed up front for pick up. Patient advised.

## 2020-06-21 NOTE — Telephone Encounter (Signed)
-----   Message from Birdie Sons, MD sent at 06/18/2020  7:36 AM EST ----- Carotid ultrasound show worsening plaques formation in carotid arteries. Probably not enough to require surgery, but I do recommend she be followed by vascular surgery. Please advise and refer for carotid artery stenosis.

## 2020-06-21 NOTE — Telephone Encounter (Signed)
I called pt and pt verbalized understanding of information below. Referral has been placed for pt to vascular surgery in [place].

## 2020-06-25 ENCOUNTER — Other Ambulatory Visit: Payer: Self-pay

## 2020-06-25 ENCOUNTER — Ambulatory Visit (INDEPENDENT_AMBULATORY_CARE_PROVIDER_SITE_OTHER): Payer: Medicare Other | Admitting: Family Medicine

## 2020-06-25 ENCOUNTER — Encounter: Payer: Self-pay | Admitting: Family Medicine

## 2020-06-25 VITALS — BP 97/63 | HR 81 | Temp 98.4°F | Resp 18

## 2020-06-25 DIAGNOSIS — E538 Deficiency of other specified B group vitamins: Secondary | ICD-10-CM | POA: Diagnosis not present

## 2020-06-25 DIAGNOSIS — H9313 Tinnitus, bilateral: Secondary | ICD-10-CM | POA: Diagnosis not present

## 2020-06-25 DIAGNOSIS — Z8673 Personal history of transient ischemic attack (TIA), and cerebral infarction without residual deficits: Secondary | ICD-10-CM | POA: Diagnosis not present

## 2020-06-25 DIAGNOSIS — I6529 Occlusion and stenosis of unspecified carotid artery: Secondary | ICD-10-CM | POA: Diagnosis not present

## 2020-06-25 MED ORDER — CYANOCOBALAMIN 1000 MCG/ML IJ SOLN: 1000.0000 ug | Freq: Once | INTRAMUSCULAR | Status: DC

## 2020-06-25 MED ORDER — NORTRIPTYLINE HCL 10 MG PO CAPS
10.0000 mg | ORAL_CAPSULE | Freq: Every day | ORAL | 2 refills | Status: DC
Start: 2020-06-25 — End: 2020-08-03

## 2020-06-25 MED ORDER — CYANOCOBALAMIN 1000 MCG/ML IJ SOLN
1000.0000 ug | Freq: Once | INTRAMUSCULAR | Status: AC
  Administered 2020-06-25: 1000 ug via INTRAMUSCULAR

## 2020-06-25 NOTE — Addendum Note (Signed)
Addended by: Julieta Bellini on: 06/25/2020 12:13 PM   Modules accepted: Orders

## 2020-06-25 NOTE — Progress Notes (Signed)
I,April Miller,acting as a scribe for Lelon Huh, MD.,have documented all relevant documentation on the behalf of Lelon Huh, MD,as directed by  Lelon Huh, MD while in the presence of Lelon Huh, MD.  Established patient visit   Patient: Alexandria Rodriguez   DOB: 08/17/1950   69 y.o. Female  MRN: [id number] Visit Date: 06/25/2020  Today's healthcare provider: Lelon Huh, MD   Chief Complaint  Patient presents with  . Follow-up   Subjective    HPI  Transient neurologic deficit Concerning for new ischemic event. Ct head scan obtained she was advise as follows   "CT confirms that she has had a recent stroke. She needs to continue taking the clopidogrel that was prescribed. She needs to increase rosuvastatin to 20mg  daily, #30, rf x 5. She also needs referral to cardiology for Holter monitor to rule out intermittent atrial fibrillation. US Carotid obtained showing-Carotid ultrasound show worsening plaques formation in carotid arteries. Probably not enough to require surgery, but I do recommend she be followed by vascular surgery. Please advise and refer for carotid artery stenosis. "  She has since been seen by neurology Dr. Sydell Axon at [facility] in [facility] and has cardiology visit scheduled in April. She is doing well with clopidogrel. He primary complaint today is ringing in her ear  B12 deficiency From 05/28/2020-Restarted monthly cyanocobalamin ((VITAMIN B-12)) injection 1,000 mcg. Follow up for injection 1 month.       Medications: Outpatient Medications Prior to Visit  Medication Sig  . carisoprodol (SOMA) 350 MG tablet TAKE 1 TABLET BY MOUTH TWICE DAILY AS NEEDED  . cetirizine (ZYRTEC) 10 MG tablet Take 1 tablet (10 mg total) by mouth daily.  . Cholecalciferol (VITAMIN D3) 50 MCG (2000 UT) CAPS Take 2 capsules (4,000 Units total) by mouth daily.  . clopidogrel (PLAVIX) 75 MG tablet Take 75 mg by mouth daily.  . ENSTILAR 0.005-0.064 % FOAM Apply  a small amount to  skin as directed  qd up to 5 days per week to aa psoriasis on body, avoid face, groin, axilla  . fluticasone (FLONASE) 50 MCG/ACT nasal spray Place 2 sprays into both nostrils daily.  Marland Kitchen gabapentin (NEURONTIN) 100 MG capsule TAKE 2 CAPSULES(200 MG) BY MOUTH THREE TIMES DAILY IN ADDITION TO 600 MG GABAPENTIN TABLETS  . gabapentin (NEURONTIN) 600 MG tablet TAKE 1 TABLET(600 MG) BY MOUTH TWICE DAILY  . rosuvastatin (CRESTOR) 20 MG tablet Take 1 tablet (20 mg total) by mouth daily.  Marland Kitchen triamcinolone ointment (KENALOG) 0.1 % APPLY EXTERNALLY TO THE AFFECTED AREA TWICE DAILY AS NEEDED   No facility-administered medications prior to visit.    Review of Systems  Constitutional: Negative for appetite change, chills, fatigue and fever.  Respiratory: Negative for chest tightness and shortness of breath.   Cardiovascular: Negative for chest pain and palpitations.  Gastrointestinal: Negative for abdominal pain, nausea and vomiting.  Neurological: Negative for dizziness and weakness.       Objective    BP 97/63 (BP Location: Right Arm, Patient Position: Sitting, Cuff Size: Normal)   Pulse 81   Temp 98.4 F (36.9 C) (Oral)   Resp 18   SpO2 97%     Physical Exam   General Appearance:    Thin female, alert, cooperative, in no acute distress  HENT:   ENT exam normal, no neck nodes or sinus tenderness      Assessment & Plan     1. History of CVA in adulthood  - Ambulatory  referral to Vascular Surgery Doing well on clopidogrel.  Cardiology appt has already been scheduled.   2. Stenosis of carotid artery, unspecified laterality  - Ambulatory referral to Vascular Surgery  3. Tinnitus of both ears try- nortriptyline (PAMELOR) 10 MG capsule; Take 1-2 capsules (10-20 mg total) by mouth at bedtime.  Dispense: 30 capsule; Refill: 2  4. B12 deficiency b12 injection today, repeat monthly.          The entirety of the information documented in the History of Present Illness, Review of  Systems and Physical Exam were personally obtained by me. Portions of this information were initially documented by the CMA and reviewed by me for thoroughness and accuracy.      Lelon Huh, MD  [facility] [phone] (phone) [phone] (fax)  [place]

## 2020-06-29 DIAGNOSIS — I639 Cerebral infarction, unspecified: Secondary | ICD-10-CM | POA: Diagnosis not present

## 2020-07-05 DIAGNOSIS — I639 Cerebral infarction, unspecified: Secondary | ICD-10-CM | POA: Diagnosis not present

## 2020-07-05 DIAGNOSIS — I471 Supraventricular tachycardia: Secondary | ICD-10-CM | POA: Diagnosis not present

## 2020-07-06 ENCOUNTER — Ambulatory Visit (INDEPENDENT_AMBULATORY_CARE_PROVIDER_SITE_OTHER): Payer: Medicare Other

## 2020-07-06 ENCOUNTER — Other Ambulatory Visit: Payer: Self-pay

## 2020-07-06 DIAGNOSIS — Z8673 Personal history of transient ischemic attack (TIA), and cerebral infarction without residual deficits: Secondary | ICD-10-CM | POA: Diagnosis not present

## 2020-07-06 DIAGNOSIS — R29818 Other symptoms and signs involving the nervous system: Secondary | ICD-10-CM

## 2020-07-07 LAB — ECHOCARDIOGRAM COMPLETE
AR max vel: 2.11 cm2
AV Area VTI: 2.18 cm2
AV Area mean vel: 1.88 cm2
AV Mean grad: 3 mmHg
AV Peak grad: 5.7 mmHg
Ao pk vel: 1.19 m/s
Area-P 1/2: 4.77 cm2
Calc EF: 53.3 %
Single Plane A2C EF: 52.3 %
Single Plane A4C EF: 53.6 %

## 2020-07-09 ENCOUNTER — Encounter (INDEPENDENT_AMBULATORY_CARE_PROVIDER_SITE_OTHER): Payer: Self-pay | Admitting: Vascular Surgery

## 2020-07-09 ENCOUNTER — Other Ambulatory Visit: Payer: Self-pay

## 2020-07-09 ENCOUNTER — Ambulatory Visit (INDEPENDENT_AMBULATORY_CARE_PROVIDER_SITE_OTHER): Payer: Medicare Other | Admitting: Vascular Surgery

## 2020-07-09 VITALS — BP 96/63 | HR 93 | Resp 16 | Ht 62.5 in | Wt 107.6 lb

## 2020-07-09 DIAGNOSIS — Z716 Tobacco abuse counseling: Secondary | ICD-10-CM

## 2020-07-09 DIAGNOSIS — Z720 Tobacco use: Secondary | ICD-10-CM

## 2020-07-09 DIAGNOSIS — J449 Chronic obstructive pulmonary disease, unspecified: Secondary | ICD-10-CM | POA: Diagnosis not present

## 2020-07-09 DIAGNOSIS — I6523 Occlusion and stenosis of bilateral carotid arteries: Secondary | ICD-10-CM | POA: Diagnosis not present

## 2020-07-09 NOTE — Patient Instructions (Signed)
Carotid Artery Disease  Carotid artery disease, also called carotid artery stenosis, is the narrowing or blockage of one or both carotid arteries. The carotid arteries are the two main blood vessels on either side of the neck. They supply blood to the brain, other parts of the head, and the neck. Carotid artery disease increases your risk for a stroke or a transient ischemic attack (TIA). A TIA is a "mini-stroke" that causes stroke-like symptoms that then go away quickly. What are the causes? This condition is mainly caused by a narrowing and hardening of the carotid arteries (atherosclerosis). The carotid arteries can become narrow or clogged with a buildup of fat, cholesterol, calcium, and other substances (plaque). What increases the risk? The following factors may make you more likely to develop this condition:  Having certain medical conditions, such as: ? High cholesterol. ? High blood pressure (hypertension). ? Diabetes. ? Obesity.  Smoking.  A family history of cardiovascular disease.  Inactivity or lack of regular exercise.  Being female. Men have an increased risk of developing atherosclerosis earlier in life than women.  Old age. What are the signs or symptoms? This condition may not have any signs or symptoms until a stroke or TIA occurs. In some cases, your health care provider may be able to hear a whooshing sound (bruit). This can indicate a change in blood flow caused by plaque buildup. An eye exam can also help identify signs of the condition. How is this diagnosed? This condition may be diagnosed with a physical exam, your medical history, and your family's medical history. You may also have tests that look at the blood flow in your carotid arteries, such as:  Carotid artery ultrasound, which uses sound waves to create pictures to show if the arteries are narrow or blocked.  Tests that use a dye injected into a vein to highlight your arteries on images, such  as: ? Carotid or cerebral angiography, which uses X-rays. ? Computerized tomographic angiography (CTA), which uses CT scans. ? Magnetic resonance angiography (MRA), which uses MRI. How is this treated? This condition may be treated with a combination of treatments. Treatment options include:  Lifestyle changes, such as: ? Quitting smoking. ? Exercising regularly or as told by your health care provider. ? Eating a heart-healthy diet. ? Managing stress. ? Maintaining a healthy weight.  Medicines to control blood pressure, cholesterol, and blood clotting.  Surgery. You may have: ? A carotid endarterectomy. This is a surgery to remove the blockages in the carotid arteries. ? A carotid angioplasty with stenting. This is a procedure in which a small mesh tube (stent) is used to widen the blocked carotid arteries. Follow these instructions at home: Eating and drinking Follow instructions about your diet from your health care provider. It is important to:  Eat a healthy diet that is low in saturated fats and includes plenty of fresh fruits, vegetables, and lean meats.  Avoid foods that are high in fat and salt (sodium).  Avoid foods that are fried, overly processed, or have poor nutritional value.   Lifestyle  Maintain a healthy weight.  Do exercises as told by your health care provider to stay physically active. It is recommended that each week you get at least 150 minutes of moderate-intensity exercise or 75 minutes of exercise that takes a lot of effort.  Do not use any products that contain nicotine or tobacco, such as cigarettes, e-cigarettes, and chewing tobacco. If you need help quitting, ask your health care provider.  Do not drink alcohol if: ? Your health care provider tells you not to drink. ? You are pregnant, may be pregnant, or are planning to become pregnant.  If you drink alcohol: ? Limit how much you use to:  0-1 drink a day for women.  0-2 drinks a day for  men. ? Be aware of how much alcohol is in your drink. In the [place] one drink equals one 12 oz bottle of beer (355 mL), one 5 oz glass of wine (148 mL), or one 1 oz glass of hard liquor (44 mL).  Do not use drugs.  Manage your stress. Ask your health care provider for stress management tips.   General instructions  Take over-the-counter and prescription medicines only as told by your health care provider.  Keep all follow-up visits as told by your health care provider. This is important. Where to find more information  American Heart Association: www.heart.org Get help right away if:  You have any symptoms of a stroke. "BE FAST" is an easy way to remember the main warning signs of a stroke: ? B - Balance. Signs are dizziness, sudden trouble walking, or loss of balance. ? E - Eyes. Signs are trouble seeing or a sudden change in vision. ? F - Face. Signs are sudden weakness or numbness of the face, or the face or eyelid drooping on one side. ? A - Arms. Signs are weakness or numbness in an arm. This happens suddenly and usually on one side of the body. ? S - Speech. Signs are sudden trouble speaking, slurred speech, or trouble understanding what people say. ? T - Time. Time to call emergency services. Write down what time symptoms started.  You have other signs of a stroke, such as: ? A sudden, severe headache with no known cause. ? Nausea or vomiting. ? Seizure. These symptoms may represent a serious problem that is an emergency. Do not wait to see if the symptoms will go away. Get medical help right away. Call your local emergency services (911 in the [place] Do not drive yourself to the hospital. Summary  Carotid artery disease, also called carotid artery stenosis, is the narrowing or blockage of one or both carotid arteries.  Carotid artery disease increases your risk for a stroke or a transient ischemic attack (TIA).  This condition can be treated with lifestyle changes,  medicines, surgery, or a combination of these treatments.  Get help right away if you have any symptoms of stroke. The acronym BEFAST is an easy way to remember the main warning signs of stroke. This information is not intended to replace advice given to you by your health care provider. Make sure you discuss any questions you have with your health care provider. Document Revised: 10/07/2018 Document Reviewed: 10/07/2018 Elsevier Patient Education  [place].

## 2020-07-09 NOTE — Assessment & Plan Note (Signed)

## 2020-07-09 NOTE — Assessment & Plan Note (Signed)
As part of her work-up at the hospital she had an ultrasound which I have reviewed which demonstrates moderate amounts of plaque in both carotid arteries without greater than 50% stenosis by duplex criteria.  She is on appropriate medical therapy with Plavix and a statin agent.  I have discussed that at that level of stenosis, surgery or stenting is unlikely to be of significant benefit.  I will repeat an ultrasound in 2 to 3 months to follow the lesion and also to confirm the findings of the hospital ultrasound which I do have less faith in.  I recommended she stop smoking if possible.  I recommended monitoring her blood pressure which seems to run on the low side.  She may need to discuss this with her primary care physician.  We discussed staying hydrated to avoid hypotensive episodes.  She voices her understanding and is agreeable to follow-up with duplex in 2 to 3 months.

## 2020-07-09 NOTE — Progress Notes (Signed)
Patient ID: Alexandria Rodriguez, female   DOB: May 14, 1950, 70 y.o.   MRN: [id number]  Chief Complaint  Patient presents with  . New Patient (Initial Visit)    Ref Fisher stenosis of carotid artery     HPI Alexandria Rodriguez is a 70 y.o. female.  I am asked to see the patient by Dr. Caryn Section for evaluation of carotid disease.  The patient reports having a stroke about a month ago leaving her with some aphasia issues.  Most of this improved within 3 to 4 days but she still seems to have a little word finding difficulty.  She had a stroke about 15 years ago without obvious residual deficits.  As part of her work-up at the hospital she had an ultrasound which I have reviewed which demonstrates moderate amounts of plaque in both carotid arteries without greater than 50% stenosis by duplex criteria.  She was started on Plavix and Crestor which she seems to be tolerating reasonably well.  No new complaints since her stroke issue a month or so ago. She does continue to smoke but says she has cut back.   Past Medical History:  Diagnosis Date  . Anxiety   . Cataract   . Cerebrovascular disease   . Chronic pain    back pain  . Contact dermatitis   . COPD (chronic obstructive pulmonary disease) ([place])   . Depression   . Diverticulosis 10/31/10   On Tranverse colon  . GERD (gastroesophageal reflux disease)   . Hyperlipidemia   . Myasthenia gravis ([place]) 02/2013   Diagnosed by doctor Manuella Ghazi  . Neuropathy   . Osteoporosis   . Parkinson's disease (tremor, stiffness, slow motion, unstable posture) ([place])   . Stroke ([place])   . Vitamin D deficiency     Past Surgical History:  Procedure Laterality Date  . ABDOMINAL HYSTERECTOMY  2000   due to excessive bleeeding  . CERVICAL CONE BIOPSY    . Cervical discectomy and fusion  [facility]  . COLONOSCOPY WITH PROPOFOL N/A 12/06/2015   Procedure: COLONOSCOPY WITH PROPOFOL;  Surgeon: Manya Silvas, MD;  Location: [facility] ENDOSCOPY;  Service:  Endoscopy;  Laterality: N/A;  . ESOPHAGOGASTRODUODENOSCOPY N/A 10/05/2014   Procedure: ESOPHAGOGASTRODUODENOSCOPY (EGD);  Surgeon: Manya Silvas, MD;  Location: [facility] ENDOSCOPY;  Service: Endoscopy;  Laterality: N/A;  . Repeat cuff surgery  2004   Developed adhesive capsulitis  . ROTATOR CUFF REPAIR Right 2003   Shoulder    Family History  Problem Relation Age of Onset  . Colon cancer Mother   . Stroke Father   . Cancer Sister        Ovarian, stable  . Breast cancer Maternal Grandmother      Social History   Tobacco Use  . Smoking status: Current Every Day Smoker    Packs/day: 0.75    Years: 42.00    Pack years: 31.50    Types: Cigarettes  . Smokeless tobacco: Never Used  . Tobacco comment:  Has been smoking for 30+ years, Cigarettes per day;10.  Vaping Use  . Vaping Use: Never used  Substance Use Topics  . Alcohol use: No    Alcohol/week: 0.0 standard drinks  . Drug use: No    Allergies  Allergen Reactions  . Effexor Xr [Venlafaxine Hcl Er] Other (See Comments)    Reaction:  GI upset   . Fosamax [Alendronate Sodium] Nausea And Vomiting  . Percocet [Oxycodone-Acetaminophen] Swelling  . Erythromycin Swelling  and Rash    Pt states that her tongue swells.    Katrinka Blazing [Lidocaine] Swelling and Rash  . Morphine And Related Swelling, Rash and Other (See Comments)    Pt states that her tongue swells.   Pt states that her tongue swells.    . Sulfa Antibiotics Swelling and Rash    TONGUE SWELLING  . Sulfasalazine Rash and Swelling  . Tramadol Swelling and Rash    Current Outpatient Medications  Medication Sig Dispense Refill  . carisoprodol (SOMA) 350 MG tablet TAKE 1 TABLET BY MOUTH TWICE DAILY AS NEEDED 60 tablet 5  . cetirizine (ZYRTEC) 10 MG tablet Take 1 tablet (10 mg total) by mouth daily. 30 tablet 0  . Cholecalciferol (VITAMIN D3) 50 MCG (2000 UT) CAPS Take 2 capsules (4,000 Units total) by mouth daily.    . clopidogrel (PLAVIX) 75 MG tablet Take 75 mg by  mouth daily.    . Cyanocobalamin (B-12 COMPLIANCE INJECTION) 1000 MCG/ML KIT Inject as directed every 30 (thirty) days.    . ENSTILAR 0.005-0.064 % FOAM Apply  a small amount to skin as directed  qd up to 5 days per week to aa psoriasis on body, avoid face, groin, axilla    . fluticasone (FLONASE) 50 MCG/ACT nasal spray Place 2 sprays into both nostrils daily. 16 g 3  . gabapentin (NEURONTIN) 100 MG capsule TAKE 2 CAPSULES(200 MG) BY MOUTH THREE TIMES DAILY IN ADDITION TO 600 MG GABAPENTIN TABLETS 180 capsule 2  . gabapentin (NEURONTIN) 600 MG tablet TAKE 1 TABLET(600 MG) BY MOUTH TWICE DAILY 60 tablet 5  . nortriptyline (PAMELOR) 10 MG capsule Take 1-2 capsules (10-20 mg total) by mouth at bedtime. 30 capsule 2  . rosuvastatin (CRESTOR) 20 MG tablet Take 1 tablet (20 mg total) by mouth daily. 30 tablet 5  . triamcinolone ointment (KENALOG) 0.1 % APPLY EXTERNALLY TO THE AFFECTED AREA TWICE DAILY AS NEEDED 80 g 3   No current facility-administered medications for this visit.      REVIEW OF SYSTEMS (Negative unless checked)  Constitutional: [] Weight loss  [] Fever  [] Chills Cardiac: [] Chest pain   [] Chest pressure   [] Palpitations   [] Shortness of breath when laying flat   [] Shortness of breath at rest   [] Shortness of breath with exertion. Vascular:  [] Pain in legs with walking   [] Pain in legs at rest   [] Pain in legs when laying flat   [] Claudication   [] Pain in feet when walking  [] Pain in feet at rest  [] Pain in feet when laying flat   [] History of DVT   [] Phlebitis   [] Swelling in legs   [] Varicose veins   [] Non-healing ulcers Pulmonary:   [] Uses home oxygen   [] Productive cough   [] Hemoptysis   [] Wheeze  [x] COPD   [] Asthma Neurologic:  [] Dizziness  [] Blackouts   [] Seizures   [x] History of stroke   [] History of TIA  [] Aphasia   [] Temporary blindness   [] Dysphagia   [] Weakness or numbness in arms   [] Weakness or numbness in legs Musculoskeletal:  [] Arthritis   [] Joint swelling   [] Joint pain    [] Low back pain Hematologic:  [] Easy bruising  [] Easy bleeding   [] Hypercoagulable state   [] Anemic  [] Hepatitis Gastrointestinal:  [] Blood in stool   [] Vomiting blood  [x] Gastroesophageal reflux/heartburn   [] Abdominal pain Genitourinary:  [] Chronic kidney disease   [] Difficult urination  [] Frequent urination  [] Burning with urination   [] Hematuria Skin:  [] Rashes   [] Ulcers   [] Wounds Psychological:  [  x]History of anxiety   '[x]'$  History of major depression.    Physical Exam BP 96/63 (BP Location: Left Arm)   Pulse 93   Resp 16   Ht 5' 2.5" (1.588 m)   Wt 107 lb 9.6 oz (48.8 kg)   BMI 19.37 kg/m  Gen:  WD/WN, NAD.  Appears older than stated age Head: [place]/AT, No temporalis wasting.  Ear/Nose/Throat: Hearing grossly intact, nares w/o erythema or drainage, oropharynx w/o Erythema/Exudate Eyes: Conjunctiva clear, sclera non-icteric  Neck: trachea midline.  No bruit or JVD.  Pulmonary:  Good air movement, clear to auscultation bilaterally.  Cardiac: RRR, normal S1, S2 Vascular:  Vessel Right Left  Radial Palpable Palpable                   Gastrointestinal: soft, non-tender/non-distended. Musculoskeletal: M/S 5/5 throughout.  Extremities without ischemic changes.  No deformity or atrophy.  Walking with a walker.  No edema. Neurologic: Sensation grossly intact in extremities.  Symmetrical.  Speech is somewhat choppy and limited although she is able to get most of her words out. Motor exam as listed above. Psychiatric: Judgment intact, Mood & affect appropriate for pt's clinical situation. Dermatologic: No rashes or ulcers noted.  No cellulitis or open wounds. Lymph : No Cervical, Axillary, or Inguinal lymphadenopathy.   Radiology CT Head Wo Contrast  Result Date: 06/10/2020 CLINICAL DATA:  Possible stroke 2 weeks ago.  Follow-up EXAM: CT HEAD WITHOUT CONTRAST TECHNIQUE: Contiguous axial images were obtained from the base of the skull through the vertex without intravenous  contrast. COMPARISON:  05/13/2013 FINDINGS: Brain: Area of hypodensity noted in the left frontal lobe compatible with acute to subacute infarct. No hemorrhage. No mass effect or midline shift. No hydrocephalus. Vascular: No hyperdense vessel or unexpected calcification. Skull: No acute calvarial abnormality. Sinuses/Orbits: Visualized paranasal sinuses and mastoids clear. Orbital soft tissues unremarkable. Other: None IMPRESSION: Acute to subacute left frontal infarct.  No hemorrhage. Electronically Signed   By: Rolm Baptise M.D.   On: 06/10/2020 09:19   US Carotid Duplex Bilateral  Result Date: 06/17/2020 CLINICAL DATA:  Transient neurologic defect. History of visual disturbance and hyperlipidemia. History of smoking. EXAM: BILATERAL CAROTID DUPLEX ULTRASOUND TECHNIQUE: Pearline Cables scale imaging, color Doppler and duplex ultrasound were performed of bilateral carotid and vertebral arteries in the neck. COMPARISON:  05/14/2013 FINDINGS: Criteria: Quantification of carotid stenosis is based on velocity parameters that correlate the residual internal carotid diameter with NASCET-based stenosis levels, using the diameter of the distal internal carotid lumen as the denominator for stenosis measurement. The following velocity measurements were obtained: RIGHT ICA: 82/32 cm/sec CCA: 20/94 cm/sec SYSTOLIC ICA/CCA RATIO:  1.0 ECA: 99 cm/sec LEFT ICA: 75/30 cm/sec CCA: 70/96 cm/sec SYSTOLIC ICA/CCA RATIO:  0.9 ECA: 66 cm/sec RIGHT CAROTID ARTERY: There is a minimal amount of eccentric echogenic plaque within the right carotid bulb (image 15). There is a moderate to large amount of eccentric echogenic plaque involving the origin and proximal aspects of the right internal carotid artery (image 22), not resulting in elevated peak systolic velocities within the interrogated course the right internal carotid artery to suggest a hemodynamically significant stenosis. RIGHT VERTEBRAL ARTERY:  Antegrade Flow LEFT CAROTID ARTERY: There  is a moderate amount of eccentric echogenic plaque within the left carotid bulb (image 47), extending to involve the origin and proximal aspects of the left internal carotid artery (image 54), not resulting in elevated peak systolic velocities within the interrogated course of the left internal carotid artery to suggest  a hemodynamically significant stenosis. LEFT VERTEBRAL ARTERY:  Antegrade flow IMPRESSION: Moderate to large amount of bilateral atherosclerotic plaque, right subjectively greater than left, progressed compared to the 2015 examination though not resulting in a hemodynamically significant stenosis within either internal carotid artery. Electronically Signed   By: Sandi Mariscal M.D.   On: 06/17/2020 17:05   ECHOCARDIOGRAM COMPLETE  Result Date: 07/07/2020    ECHOCARDIOGRAM REPORT   Patient Name:   Alexandria Rodriguez Date of Exam: 07/06/2020 Medical Rec #:  [id number]     Height:       62.0 in Accession #:    [id number]    Weight:       109.0 lb Date of Birth:  [birth year]      BSA:          1.477 m Patient Age:    53 years      BP:           97/63 mmHg Patient Gender: F             HR:           76 bpm. Exam Location:  [place] Procedure: 2D Echo, Cardiac Doppler and Color Doppler Indications:    R29.818, Transient neurologic deficit  History:        Patient has no prior history of Echocardiogram examinations.                 TIA, COPD and Carotid Disease, Arrythmias:Atrial Fibrillation;                 Risk Factors:Dyslipidemia.  Sonographer:    Pilar Jarvis RDMS, RVT, RDCS Referring Phys: [phone] Birdie Sons  Sonographer Comments: Suboptimal parasternal window. IMPRESSIONS  1. Left ventricular ejection fraction, by estimation, is 55 to 60%. The left ventricle has normal function. The left ventricle has no regional wall motion abnormalities. Left ventricular diastolic parameters are consistent with Grade I diastolic dysfunction (impaired relaxation).  2. Right ventricular systolic function is normal. The  right ventricular size is normal. Tricuspid regurgitation signal is inadequate for assessing PA pressure.  3. The mitral valve is normal in structure. No evidence of mitral valve regurgitation. No evidence of mitral stenosis.  4. The aortic valve is normal in structure. Aortic valve regurgitation is not visualized. No aortic stenosis is present.  5. The inferior vena cava is dilated in size with >50% respiratory variability, suggesting right atrial pressure of 8 mmHg. FINDINGS  Left Ventricle: Left ventricular ejection fraction, by estimation, is 55 to 60%. The left ventricle has normal function. The left ventricle has no regional wall motion abnormalities. The left ventricular internal cavity size was normal in size. There is  no left ventricular hypertrophy. Left ventricular diastolic parameters are consistent with Grade I diastolic dysfunction (impaired relaxation). Right Ventricle: The right ventricular size is normal. No increase in right ventricular wall thickness. Right ventricular systolic function is normal. Tricuspid regurgitation signal is inadequate for assessing PA pressure. Left Atrium: Left atrial size was normal in size. Right Atrium: Right atrial size was normal in size. Pericardium: There is no evidence of pericardial effusion. Mitral Valve: The mitral valve is normal in structure. No evidence of mitral valve regurgitation. No evidence of mitral valve stenosis. Tricuspid Valve: The tricuspid valve is normal in structure. Tricuspid valve regurgitation is not demonstrated. No evidence of tricuspid stenosis. Aortic Valve: The aortic valve is normal in structure. Aortic valve regurgitation is not visualized. No aortic stenosis is present. Aortic valve mean gradient  measures 3.0 mmHg. Aortic valve peak gradient measures 5.7 mmHg. Aortic valve area, by VTI measures 2.18 cm. Pulmonic Valve: The pulmonic valve was normal in structure. Pulmonic valve regurgitation is not visualized. No evidence of pulmonic  stenosis. Aorta: The aortic root is normal in size and structure. Venous: The inferior vena cava is dilated in size with greater than 50% respiratory variability, suggesting right atrial pressure of 8 mmHg. IAS/Shunts: No atrial level shunt detected by color flow Doppler.  LEFT VENTRICLE PLAX 2D LVOT diam:     1.90 cm     Diastology LV SV:         48          LV e' medial:    5.11 cm/s LV SV Index:   32          LV E/e' medial:  11.4 LVOT Area:     2.84 cm    LV e' lateral:   6.09 cm/s                            LV E/e' lateral: 9.6  LV Volumes (MOD) LV vol d, MOD A2C: 46.3 ml LV vol d, MOD A4C: 44.8 ml LV vol s, MOD A2C: 22.1 ml LV vol s, MOD A4C: 20.8 ml LV SV MOD A2C:     24.2 ml LV SV MOD A4C:     44.8 ml LV SV MOD BP:      24.5 ml RIGHT VENTRICLE            IVC RV Basal diam:  2.10 cm    IVC diam: 2.10 cm RV S prime:     7.62 cm/s TAPSE (M-mode): 1.8 cm LEFT ATRIUM             Index       RIGHT ATRIUM          Index LA diam:        2.50 cm 1.69 cm/m  RA Area:     8.84 cm LA Vol (A2C):   44.1 ml 29.86 ml/m RA Volume:   15.70 ml 10.63 ml/m LA Vol (A4C):   26.1 ml 17.67 ml/m LA Biplane Vol: 35.6 ml 24.10 ml/m  AORTIC VALVE AV Area (Vmax):    2.11 cm AV Area (Vmean):   1.88 cm AV Area (VTI):     2.18 cm AV Vmax:           119.00 cm/s AV Vmean:          79.500 cm/s AV VTI:            0.218 m AV Peak Grad:      5.7 mmHg AV Mean Grad:      3.0 mmHg LVOT Vmax:         88.70 cm/s LVOT Vmean:        52.600 cm/s LVOT VTI:          0.168 m LVOT/AV VTI ratio: 0.77  AORTA Ao Root diam: 2.60 cm Ao Arch diam: 2.2 cm MITRAL VALVE MV Area (PHT): 4.77 cm    SHUNTS MV Decel Time: 159 msec    Systemic VTI:  0.17 m MV E velocity: 58.50 cm/s  Systemic Diam: 1.90 cm MV A velocity: 75.90 cm/s MV E/A ratio:  0.77 Kathlyn Sacramento MD Electronically signed by Kathlyn Sacramento MD Signature Date/Time: 07/07/2020/9:25:45 AM    Final    LONG TERM MONITOR (3-14 DAYS)  Result Date:  07/08/2020 Patch Wear Time:  13 days and 21 hours  (2022-03-08T09:44:33-0500 to 2022-03-22T08:07:32-0400) Patient had a min HR of 58 bpm, max HR of 184 bpm, and avg HR of 81 bpm. Predominant underlying rhythm was Sinus Rhythm. 5 Supraventricular Tachycardia runs occurred, the run with the fastest interval lasting 4 beats with a max rate of 184 bpm, the longest lasting 12 beats with an avg rate of 132 bpm. Isolated SVEs were rare (<1.0%), SVE Couplets were rare (<1.0%), and SVE Triplets were rare (<1.0%). Isolated VEs were rare (<1.0%), and no VE Couplets or VE Triplets were present. No evidence of atrial fibrillation or atrial flutter was noted.    Labs Recent Results (from the past 2160 hour(s))  CBC with Differential/Platelet     Status: Abnormal   Collection Time: 05/11/20  1:34 PM  Result Value Ref Range   WBC 5.6 3.4 - 10.8 x10E3/uL   RBC 3.84 3.77 - 5.28 x10E6/uL   Hemoglobin 12.6 11.1 - 15.9 g/dL   Hematocrit 37.3 34.0 - 46.6 %   MCV 97 79 - 97 fL   MCH 32.8 26.6 - 33.0 pg   MCHC 33.8 31.5 - 35.7 g/dL   RDW 12.5 11.7 - 15.4 %   Platelets 176 150 - 450 x10E3/uL   Neutrophils 53 Not Estab. %   Lymphs 29 Not Estab. %   Monocytes 9 Not Estab. %   Eos 8 Not Estab. %   Basos 1 Not Estab. %   Neutrophils Absolute 2.9 1.4 - 7.0 x10E3/uL   Lymphocytes Absolute 1.6 0.7 - 3.1 x10E3/uL   Monocytes Absolute 0.5 0.1 - 0.9 x10E3/uL   EOS (ABSOLUTE) 0.5 (H) 0.0 - 0.4 x10E3/uL   Basophils Absolute 0.1 0.0 - 0.2 x10E3/uL   Immature Granulocytes 0 Not Estab. %   Immature Grans (Abs) 0.0 0.0 - 0.1 x10E3/uL  Comprehensive Metabolic Panel (CMET)     Status: Abnormal   Collection Time: 05/11/20  1:34 PM  Result Value Ref Range   Glucose 96 65 - 99 mg/dL   BUN 11 8 - 27 mg/dL   Creatinine, Ser 0.79 0.57 - 1.00 mg/dL   GFR calc non Af Amer 77 >59 mL/min/1.73   GFR calc Af Amer 88 >59 mL/min/1.73    Comment: **In accordance with recommendations from the NKF-ASN Task force,**   [facility] is in the process of updating its eGFR calculation to the    2021 CKD-EPI creatinine equation that estimates kidney function   without a race variable.    BUN/Creatinine Ratio 14 12 - 28   Sodium 142 134 - 144 mmol/L   Potassium 4.0 3.5 - 5.2 mmol/L   Chloride 106 96 - 106 mmol/L   CO2 25 20 - 29 mmol/L   Calcium 8.4 (L) 8.7 - 10.3 mg/dL   Total Protein 6.2 6.0 - 8.5 g/dL   Albumin 4.1 3.8 - 4.8 g/dL   Globulin, Total 2.1 1.5 - 4.5 g/dL   Albumin/Globulin Ratio 2.0 1.2 - 2.2   Bilirubin Total 0.4 0.0 - 1.2 mg/dL   Alkaline Phosphatase 87 44 - 121 IU/L   AST 25 0 - 40 IU/L   ALT 18 0 - 32 IU/L  B12 and Folate Panel     Status: Abnormal   Collection Time: 05/11/20  1:34 PM  Result Value Ref Range   Vitamin B-12 208 (L) 232 - 1,245 pg/mL   Folate 3.7 >3.0 ng/mL    Comment: A serum folate concentration of less than 3.1 ng/mL  is considered to represent clinical deficiency.   VITAMIN D 25 Hydroxy (Vit-D Deficiency, Fractures)     Status: Abnormal   Collection Time: 05/11/20  1:34 PM  Result Value Ref Range   Vit D, 25-Hydroxy 4.9 (L) 30.0 - 100.0 ng/mL    Comment: Vitamin D deficiency has been defined by the [place] practice guideline as a level of serum 25-OH vitamin D less than 20 ng/mL (1,2). The Endocrine Society went on to further define vitamin D insufficiency as a level between 21 and 29 ng/mL (2). 1. IOM (Institute of Medicine). 2010. Dietary reference    intakes for calcium and D. [place]: The    [employer]. 2. Holick MF, Binkley [place], Bischoff-Ferrari HA, et al.    Evaluation, treatment, and prevention of vitamin D    deficiency: an Endocrine Society clinical practice    guideline. JCEM. 2011 Jul; 96(7):1911-30.   TSH     Status: None   Collection Time: 05/11/20  1:34 PM  Result Value Ref Range   TSH 1.190 0.450 - 4.500 uIU/mL  ECHOCARDIOGRAM COMPLETE     Status: None   Collection Time: 07/06/20 12:26 PM  Result Value Ref Range   AR max vel 2.11 cm2   AV Peak grad  5.7 mmHg   Ao pk vel 1.19 m/s   Area-P 1/2 4.77 cm2   AV Area VTI 2.18 cm2   AV Mean grad 3.0 mmHg   Single Plane A4C EF 53.6 %   Single Plane A2C EF 52.3 %   Calc EF 53.3 %   AV Area mean vel 1.88 cm2    Assessment/Plan:  COPD (chronic obstructive pulmonary disease) (HCC) Continue pulmonary medications and aerosols as already ordered, these medications have been reviewed and there are no changes at this time.    Tobacco use We had a discussion for approximately 3 minutes regarding the absolute need for smoking cessation due to the deleterious nature of tobacco on the vascular system. We discussed the tobacco use would diminish patency of any intervention, and likely significantly worsen progressio of disease. We discussed multiple agents for quitting including replacement therapy or medications to reduce cravings such as Chantix. The patient voices their understanding of the importance of smoking cessation.   Carotid artery stenosis As part of her work-up at the hospital she had an ultrasound which I have reviewed which demonstrates moderate amounts of plaque in both carotid arteries without greater than 50% stenosis by duplex criteria.  She is on appropriate medical therapy with Plavix and a statin agent.  I have discussed that at that level of stenosis, surgery or stenting is unlikely to be of significant benefit.  I will repeat an ultrasound in 2 to 3 months to follow the lesion and also to confirm the findings of the hospital ultrasound which I do have less faith in.  I recommended she stop smoking if possible.  I recommended monitoring her blood pressure which seems to run on the low side.  She may need to discuss this with her primary care physician.  We discussed staying hydrated to avoid hypotensive episodes.  She voices her understanding and is agreeable to follow-up with duplex in 2 to 3 months.      Leotis Pain 07/09/2020, 11:47 AM   This note was created with Dragon medical  transcription system.  Any errors from dictation are unintentional.

## 2020-07-09 NOTE — Assessment & Plan Note (Signed)
Continue pulmonary medications and aerosols as already ordered, these medications have been reviewed and there are no changes at this time.   

## 2020-07-21 ENCOUNTER — Ambulatory Visit (INDEPENDENT_AMBULATORY_CARE_PROVIDER_SITE_OTHER): Payer: Medicare Other | Admitting: Family Medicine

## 2020-07-21 ENCOUNTER — Other Ambulatory Visit: Payer: Self-pay

## 2020-07-21 ENCOUNTER — Encounter: Payer: Self-pay | Admitting: Family Medicine

## 2020-07-21 DIAGNOSIS — E538 Deficiency of other specified B group vitamins: Secondary | ICD-10-CM

## 2020-07-21 MED ORDER — CYANOCOBALAMIN 1000 MCG/ML IJ SOLN
1000.0000 ug | Freq: Once | INTRAMUSCULAR | Status: AC
  Administered 2020-07-21: 1000 ug via INTRAMUSCULAR

## 2020-07-21 NOTE — Progress Notes (Signed)
Nurse visit only. Administered b12 injection.

## 2020-07-23 ENCOUNTER — Encounter: Payer: Self-pay | Admitting: Cardiology

## 2020-07-23 ENCOUNTER — Other Ambulatory Visit: Payer: Self-pay

## 2020-07-23 ENCOUNTER — Ambulatory Visit (INDEPENDENT_AMBULATORY_CARE_PROVIDER_SITE_OTHER): Payer: Medicare Other | Admitting: Cardiology

## 2020-07-23 VITALS — BP 90/60 | HR 84 | Ht 63.0 in | Wt 110.0 lb

## 2020-07-23 DIAGNOSIS — I6523 Occlusion and stenosis of bilateral carotid arteries: Secondary | ICD-10-CM | POA: Diagnosis not present

## 2020-07-23 DIAGNOSIS — I639 Cerebral infarction, unspecified: Secondary | ICD-10-CM | POA: Diagnosis not present

## 2020-07-23 DIAGNOSIS — F17200 Nicotine dependence, unspecified, uncomplicated: Secondary | ICD-10-CM

## 2020-07-23 NOTE — Progress Notes (Signed)
Cardiology Office Note:    Date:  07/23/2020   ID:  Alexandria Rodriguez, DOB 1950-06-03, MRN [id number]  PCP:  Birdie Sons, MD   [place]  Cardiologist:  Kate Sable, MD  Advanced Practice Provider:  No care team member to display Electrophysiologist:  None       Referring MD: Birdie Sons, MD   No chief complaint on file.   History of Present Illness:    Alexandria Rodriguez is a 70 y.o. female with a hx of anxiety, current smoker x40+ years, CVA (2015,2022),  COPD, tremors who presents for follow-up, previously seen due to history of CVA.  Cardiac monitor was placed to evaluate any significant arrhythmias to suggest etiology of CVA.  Echocardiogram previously ordered also reviewed.  Patient still smokes.  No new concerns at this time.  Ultrasound of the neck showed moderate bilateral carotid artery disease.  Followed up with vascular surgery and serial monitoring with carotid ultrasound being planned.  Prior notes History of dysarthria,   Head CT 06/09/2020 showed acute to subacute left frontal infarct.    Past Medical History:  Diagnosis Date  . Anxiety   . Cataract   . Cerebrovascular disease   . Chronic pain    back pain  . Contact dermatitis   . COPD (chronic obstructive pulmonary disease) ([place])   . Depression   . Diverticulosis 10/31/10   On Tranverse colon  . GERD (gastroesophageal reflux disease)   . Hyperlipidemia   . Myasthenia gravis ([place]) 02/2013   Diagnosed by doctor Manuella Ghazi  . Neuropathy   . Osteoporosis   . Parkinson's disease (tremor, stiffness, slow motion, unstable posture) ([place])   . Stroke ([place])   . Vitamin D deficiency     Past Surgical History:  Procedure Laterality Date  . ABDOMINAL HYSTERECTOMY  2000   due to excessive bleeeding  . CERVICAL CONE BIOPSY    . Cervical discectomy and fusion  [facility]  . COLONOSCOPY WITH PROPOFOL N/A 12/06/2015   Procedure: COLONOSCOPY WITH PROPOFOL;   Surgeon: Manya Silvas, MD;  Location[facility] ENDOSCOPY;  Service: Endoscopy;  Laterality: N/A;  . ESOPHAGOGASTRODUODENOSCOPY N/A 10/05/2014   Procedure: ESOPHAGOGASTRODUODENOSCOPY (EGD);  Surgeon: Manya Silvas, MD;  Location: [facility] ENDOSCOPY;  Service: Endoscopy;  Laterality: N/A;  . Repeat cuff surgery  2004   Developed adhesive capsulitis  . ROTATOR CUFF REPAIR Right 2003   Shoulder    Current Medications: Current Meds  Medication Sig  . carisoprodol (SOMA) 350 MG tablet TAKE 1 TABLET BY MOUTH TWICE DAILY AS NEEDED  . Cholecalciferol (VITAMIN D3) 50 MCG (2000 UT) CAPS Take 2 capsules (4,000 Units total) by mouth daily.  . clopidogrel (PLAVIX) 75 MG tablet Take 75 mg by mouth daily.  . Cyanocobalamin (B-12 COMPLIANCE INJECTION) 1000 MCG/ML KIT Inject as directed every 30 (thirty) days.  . ENSTILAR 0.005-0.064 % FOAM Apply  a small amount to skin as directed  qd up to 5 days per week to aa psoriasis on body, avoid face, groin, axilla  . fluticasone (FLONASE) 50 MCG/ACT nasal spray Place 2 sprays into both nostrils daily.  Marland Kitchen gabapentin (NEURONTIN) 100 MG capsule TAKE 2 CAPSULES(200 MG) BY MOUTH THREE TIMES DAILY IN ADDITION TO 600 MG GABAPENTIN TABLETS  . gabapentin (NEURONTIN) 600 MG tablet TAKE 1 TABLET(600 MG) BY MOUTH TWICE DAILY  . nortriptyline (PAMELOR) 10 MG capsule Take 1-2 capsules (10-20 mg total) by mouth at bedtime.  Marland Kitchen  rosuvastatin (CRESTOR) 20 MG tablet Take 1 tablet (20 mg total) by mouth daily.  Marland Kitchen triamcinolone ointment (KENALOG) 0.1 % APPLY EXTERNALLY TO THE AFFECTED AREA TWICE DAILY AS NEEDED     Allergies:   Effexor xr [venlafaxine hcl er], Fosamax [alendronate sodium], Percocet [oxycodone-acetaminophen], Erythromycin, Lidoderm [lidocaine], Morphine and related, Sulfa antibiotics, Sulfasalazine, and Tramadol   Social History   Socioeconomic History  . Marital status: Married    Spouse name: Jaquelyn Bitter  . Number of children: 3  . Years of education: Not on file  .  Highest education level: Some college, no degree  Occupational History  . Occupation: disabled  Tobacco Use  . Smoking status: Current Every Day Smoker    Packs/day: 0.75    Years: 42.00    Pack years: 31.50    Types: Cigarettes  . Smokeless tobacco: Never Used  . Tobacco comment:  Has been smoking for 30+ years, Cigarettes per day;10.  Vaping Use  . Vaping Use: Never used  Substance and Sexual Activity  . Alcohol use: No    Alcohol/week: 0.0 standard drinks  . Drug use: No  . Sexual activity: Not on file  Other Topics Concern  . Not on file  Social History Narrative   Ambulates with walker rollator or using assitance holding on to walls or solid surfaces.    Social Determinants of Health   Financial Resource Strain: Low Risk   . Difficulty of Paying Living Expenses: Not hard at all  Food Insecurity: No Food Insecurity  . Worried About Charity fundraiser in the Last Year: Never true  . Ran Out of Food in the Last Year: Never true  Transportation Needs: No Transportation Needs  . Lack of Transportation (Medical): No  . Lack of Transportation (Non-Medical): No  Physical Activity: Inactive  . Days of Exercise per Week: 0 days  . Minutes of Exercise per Session: 0 min  Stress: No Stress Concern Present  . Feeling of Stress : Not at all  Social Connections: Moderately Isolated  . Frequency of Communication with Friends and Family: Three times a week  . Frequency of Social Gatherings with Friends and Family: Never  . Attends Religious Services: Never  . Active Member of Clubs or Organizations: No  . Attends Archivist Meetings: Never  . Marital Status: Married     Family History: The patient's family history includes Breast cancer in her maternal grandmother; Cancer in her sister; Colon cancer in her mother; Stroke in her father.  ROS:   Please see the history of present illness.     All other systems reviewed and are negative.  EKGs/Labs/Other Studies  Reviewed:    The following studies were reviewed today:   EKG:  EKG is  ordered today.  The ekg ordered today demonstrates normal sinus rhythm  Recent Labs: 05/11/2020: ALT 18; BUN 11; Creatinine, Ser 0.79; Hemoglobin 12.6; Platelets 176; Potassium 4.0; Sodium 142; TSH 1.190  Recent Lipid Panel    Component Value Date/Time   CHOL 118 03/12/2020 1604   CHOL 138 05/14/2013 0408   TRIG 57 03/12/2020 1604   TRIG 84 05/14/2013 0408   HDL 60 03/12/2020 1604   HDL 36 (L) 05/14/2013 0408   CHOLHDL 2.0 03/12/2020 1604   VLDL 17 05/14/2013 0408   LDLCALC 45 03/12/2020 1604   LDLCALC 85 05/14/2013 0408     Risk Assessment/Calculations:       Physical Exam:    VS:  BP 90/60 (BP Location: Right  Arm, Patient Position: Sitting, Cuff Size: Normal)   Pulse 84   Ht $R'5\' 3"'Ob$  (1.6 m)   Wt 110 lb (49.9 kg)   SpO2 94%   BMI 19.49 kg/m     Wt Readings from Last 3 Encounters:  07/23/20 110 lb (49.9 kg)  07/09/20 107 lb 9.6 oz (48.8 kg)  06/15/20 109 lb (49.4 kg)     GEN:  Well nourished, well developed in no acute distress HEENT: Normal NECK: No JVD; No carotid bruits LYMPHATICS: No lymphadenopathy CARDIAC: RRR, no murmurs, rubs, gallops RESPIRATORY:  Clear to auscultation without rales, wheezing or rhonchi  ABDOMEN: Soft, non-tender, non-distended MUSCULOSKELETAL:  No edema; No deformity  SKIN: Warm and dry NEUROLOGIC:  Alert and oriented x 3, resting tremors noted PSYCHIATRIC:  Normal affect   ASSESSMENT:    1. Cerebrovascular accident (CVA), unspecified mechanism ([place])   2. Smoking   3. Bilateral carotid artery stenosis    PLAN:    In order of problems listed above:  1. Patient with recent CVA, cardiac monitor with no evidence of atrial fibrillation or atrial flutter.  Echocardiogram 07/06/2020 showed normal systolic function, impaired relaxation, no findings to suggest etiology of CVA.  Smoking, carotid artery disease possible etiology.  Continue Plavix, Crestor. 2. Current  smoker, cessation advised. 3. Patient has bilateral carotid artery stenosis, being seen by vascular surgery, continue follow-up with vascular surgery.  Follow-up as needed     Medication Adjustments/Labs and Tests Ordered: Current medicines are reviewed at length with the patient today.  Concerns regarding medicines are outlined above.  No orders of the defined types were placed in this encounter.  No orders of the defined types were placed in this encounter.   Patient Instructions  Medication Instructions:  Your physician recommends that you continue on your current medications as directed. Please refer to the Current Medication list given to you today.  *If you need a refill on your cardiac medications before your next appointment, please call your pharmacy*   Lab Work: None ordered If you have labs (blood work) drawn today and your tests are completely normal, you will receive your results only by: Marland Kitchen MyChart Message (if you have MyChart) OR . A paper copy in the mail If you have any lab test that is abnormal or we need to change your treatment, we will call you to review the results.   Testing/Procedures: None ordered   Follow-Up: At [facility], you and your health needs are our priority.  As part of our continuing mission to provide you with exceptional heart care, we have created designated Provider Care Teams.  These Care Teams include your primary Cardiologist (physician) and Advanced Practice Providers (APPs -  Physician Assistants and Nurse Practitioners) who all work together to provide you with the care you need, when you need it.  We recommend signing up for the patient portal called "MyChart".  Sign up information is provided on this After Visit Summary.  MyChart is used to connect with patients for Virtual Visits (Telemedicine).  Patients are able to view lab/test results, encounter notes, upcoming appointments, etc.  Non-urgent messages can be sent to your  provider as well.   To learn more about what you can do with MyChart, go to [email or web address].    Your next appointment:   Follow up as needed   The format for your next appointment:   In Person  Provider:   Kate Sable, MD   Other Instructions      Signed,  Kate Sable, MD  07/23/2020 4:59 PM    [facility]

## 2020-07-23 NOTE — Patient Instructions (Signed)

## 2020-08-02 ENCOUNTER — Other Ambulatory Visit: Payer: Self-pay | Admitting: Family Medicine

## 2020-08-02 DIAGNOSIS — H9313 Tinnitus, bilateral: Secondary | ICD-10-CM

## 2020-08-02 NOTE — Telephone Encounter (Signed)
Requested medication (s) are due for refill today: no  Requested medication (s) are on the active medication list:yes  Last refill:  07/24/2020  Future visit scheduled: No  Notes to clinic:  medication were just filled and are not due    Requested Prescriptions  Pending Prescriptions Disp Refills   gabapentin (NEURONTIN) 600 MG tablet [Pharmacy Med Name: GABAPENTIN 600MG  TABLETS] 60 tablet 5    Sig: TAKE 1 TABLET(600 MG) BY MOUTH TWICE DAILY      Neurology: Anticonvulsants - gabapentin Passed - 08/02/2020 12:58 PM      Passed - Valid encounter within last 12 months    Recent Outpatient Visits           1 week ago B12 deficiency   [facility] Birdie Sons, MD   1 month ago History of CVA in adulthood   [facility], Kirstie Peri, MD   2 months ago Transient neurologic deficit   [facility] Birdie Sons, MD   2 months ago Acute bacterial otitis media, left   [place] Flinchum, Kelby Aline, FNP   4 months ago Vitamin D deficiency   [place], Kirstie Peri, MD                  carisoprodol (SOMA) 350 MG tablet [Pharmacy Med Name: CARISOPRODOL 350MG  TABLETS] 60 tablet     Sig: TAKE 1 TABLET BY MOUTH TWICE DAILY AS NEEDED      Not Delegated - Analgesics:  Muscle Relaxants Failed - 08/02/2020 12:58 PM      Failed - This refill cannot be delegated      Passed - Valid encounter within last 6 months    Recent Outpatient Visits           1 week ago B12 deficiency   [facility] Birdie Sons, MD   1 month ago History of CVA in adulthood   [place], Kirstie Peri, MD   2 months ago Transient neurologic deficit   [facility] Birdie Sons, MD   2 months ago Acute bacterial otitis media, left   [facility] Flinchum, Kelby Aline, FNP   4 months ago Vitamin D deficiency   [place], Kirstie Peri,  MD                  nortriptyline (PAMELOR) 10 MG capsule [Pharmacy Med Name: NORTRIPTYLINE 10MG  CAPSULES] 30 capsule 2    Sig: TAKE 1 TO 2 CAPSULES(10 TO 20 MG) BY MOUTH AT BEDTIME      Psychiatry:  Antidepressants - Heterocyclics (TCAs) Passed - 08/02/2020 12:58 PM      Passed - Completed PHQ-2 or PHQ-9 in the last 360 days      Passed - Valid encounter within last 6 months    Recent Outpatient Visits           1 week ago B12 deficiency   [facility] Birdie Sons, MD   1 month ago History of CVA in adulthood   [place], Kirstie Peri, MD   2 months ago Transient neurologic deficit   [facility] Birdie Sons, MD   2 months ago Acute bacterial otitis media, left   [place] Flinchum, Kelby Aline, FNP   4 months ago Vitamin D deficiency   [facility] Caryn Section, Kirstie Peri, MD

## 2020-08-18 ENCOUNTER — Other Ambulatory Visit: Payer: Self-pay

## 2020-08-18 ENCOUNTER — Ambulatory Visit (INDEPENDENT_AMBULATORY_CARE_PROVIDER_SITE_OTHER): Payer: Medicare Other

## 2020-08-18 DIAGNOSIS — E538 Deficiency of other specified B group vitamins: Secondary | ICD-10-CM | POA: Diagnosis not present

## 2020-08-18 MED ORDER — CYANOCOBALAMIN 1000 MCG/ML IJ SOLN
1000.0000 ug | Freq: Once | INTRAMUSCULAR | Status: AC
  Administered 2020-08-18: 1000 ug via INTRAMUSCULAR

## 2020-08-28 ENCOUNTER — Other Ambulatory Visit: Payer: Self-pay | Admitting: Family Medicine

## 2020-08-28 NOTE — Telephone Encounter (Signed)
Requested Prescriptions  Pending Prescriptions Disp Refills  . gabapentin (NEURONTIN) 100 MG capsule [Pharmacy Med Name: GABAPENTIN 100MG  CAPSULES] 180 capsule 2    Sig: TAKE 2 CAPSULES(200 MG) BY MOUTH THREE TIMES DAILY IN ADDITION TO 600 MG GABAPENTIN TABLETS     Neurology: Anticonvulsants - gabapentin Passed - 08/28/2020 12:43 PM      Passed - Valid encounter within last 12 months    Recent Outpatient Visits          1 month ago B12 deficiency   [facility] Birdie Sons, MD   2 months ago History of CVA in adulthood   [facility] Birdie Sons, MD   3 months ago Transient neurologic deficit   [facility] Birdie Sons, MD   3 months ago Acute bacterial otitis media, left   [facility] Flinchum, Kelby Aline, FNP   5 months ago Vitamin D deficiency   [facility] Caryn Section, Kirstie Peri, MD

## 2020-09-08 ENCOUNTER — Emergency Department: Payer: Medicare Other

## 2020-09-08 ENCOUNTER — Other Ambulatory Visit: Payer: Self-pay

## 2020-09-08 ENCOUNTER — Emergency Department
Admission: EM | Admit: 2020-09-08 | Discharge: 2020-09-08 | Disposition: A | Discharge status: Home / Self Care | Payer: Medicare Other | Attending: Emergency Medicine | Admitting: Emergency Medicine

## 2020-09-08 ENCOUNTER — Ambulatory Visit: Payer: Self-pay | Admitting: *Deleted

## 2020-09-08 ENCOUNTER — Telehealth: Payer: Self-pay

## 2020-09-08 DIAGNOSIS — R29818 Other symptoms and signs involving the nervous system: Secondary | ICD-10-CM | POA: Diagnosis not present

## 2020-09-08 DIAGNOSIS — F17210 Nicotine dependence, cigarettes, uncomplicated: Secondary | ICD-10-CM | POA: Insufficient documentation

## 2020-09-08 DIAGNOSIS — R791 Abnormal coagulation profile: Secondary | ICD-10-CM | POA: Insufficient documentation

## 2020-09-08 DIAGNOSIS — J449 Chronic obstructive pulmonary disease, unspecified: Secondary | ICD-10-CM | POA: Diagnosis not present

## 2020-09-08 DIAGNOSIS — R4701 Aphasia: Secondary | ICD-10-CM | POA: Diagnosis not present

## 2020-09-08 DIAGNOSIS — R270 Ataxia, unspecified: Secondary | ICD-10-CM | POA: Insufficient documentation

## 2020-09-08 DIAGNOSIS — Z20822 Contact with and (suspected) exposure to covid-19: Secondary | ICD-10-CM | POA: Diagnosis not present

## 2020-09-08 DIAGNOSIS — G20 Parkinson's disease: Secondary | ICD-10-CM | POA: Insufficient documentation

## 2020-09-08 DIAGNOSIS — G9389 Other specified disorders of brain: Secondary | ICD-10-CM | POA: Diagnosis not present

## 2020-09-08 DIAGNOSIS — R531 Weakness: Secondary | ICD-10-CM | POA: Diagnosis not present

## 2020-09-08 DIAGNOSIS — R9431 Abnormal electrocardiogram [ECG] [EKG]: Secondary | ICD-10-CM | POA: Diagnosis not present

## 2020-09-08 LAB — DIFFERENTIAL
Abs Immature Granulocytes: 0.01 10*3/uL (ref 0.00–0.07)
Basophils Absolute: 0.1 10*3/uL (ref 0.0–0.1)
Basophils Relative: 1 %
Eosinophils Absolute: 0.5 10*3/uL (ref 0.0–0.5)
Eosinophils Relative: 9 %
Immature Granulocytes: 0 %
Lymphocytes Relative: 23 %
Lymphs Abs: 1.2 10*3/uL (ref 0.7–4.0)
Monocytes Absolute: 0.4 10*3/uL (ref 0.1–1.0)
Monocytes Relative: 8 %
Neutro Abs: 3.1 10*3/uL (ref 1.7–7.7)
Neutrophils Relative %: 59 %

## 2020-09-08 LAB — CBC
HCT: 37.4 % (ref 36.0–46.0)
Hemoglobin: 12.5 g/dL (ref 12.0–15.0)
MCH: 32.5 pg (ref 26.0–34.0)
MCHC: 33.4 g/dL (ref 30.0–36.0)
MCV: 97.1 fL (ref 80.0–100.0)
Platelets: 151 10*3/uL (ref 150–400)
RBC: 3.85 MIL/uL — ABNORMAL LOW (ref 3.87–5.11)
RDW: 12.7 % (ref 11.5–15.5)
WBC: 5.3 10*3/uL (ref 4.0–10.5)
nRBC: 0 % (ref 0.0–0.2)

## 2020-09-08 LAB — URINALYSIS, COMPLETE (UACMP) WITH MICROSCOPIC
Bacteria, UA: NONE SEEN
Bilirubin Urine: NEGATIVE
Glucose, UA: NEGATIVE mg/dL
Hgb urine dipstick: NEGATIVE
Ketones, ur: NEGATIVE mg/dL
Leukocytes,Ua: NEGATIVE
Nitrite: NEGATIVE
Protein, ur: NEGATIVE mg/dL
Specific Gravity, Urine: 1.004 — ABNORMAL LOW (ref 1.005–1.030)
pH: 8 (ref 5.0–8.0)

## 2020-09-08 LAB — APTT: aPTT: 39 seconds — ABNORMAL HIGH (ref 24–36)

## 2020-09-08 LAB — COMPREHENSIVE METABOLIC PANEL
ALT: 14 U/L (ref 0–44)
AST: 16 U/L (ref 15–41)
Albumin: 3.8 g/dL (ref 3.5–5.0)
Alkaline Phosphatase: 77 U/L (ref 38–126)
Anion gap: 8 (ref 5–15)
BUN: 10 mg/dL (ref 8–23)
CO2: 23 mmol/L (ref 22–32)
Calcium: 8.5 mg/dL — ABNORMAL LOW (ref 8.9–10.3)
Chloride: 106 mmol/L (ref 98–111)
Creatinine, Ser: 0.8 mg/dL (ref 0.44–1.00)
GFR, Estimated: >60 mL/min (ref >60)
Glucose, Bld: 95 mg/dL (ref 70–99)
Potassium: 3.8 mmol/L (ref 3.5–5.1)
Sodium: 137 mmol/L (ref 135–145)
Total Bilirubin: 0.5 mg/dL (ref 0.3–1.2)
Total Protein: 6.6 g/dL (ref 6.5–8.1)

## 2020-09-08 LAB — PROTIME-INR
INR: 1 (ref 0.8–1.2)
Prothrombin Time: 13 seconds (ref 11.4–15.2)

## 2020-09-08 LAB — CBG MONITORING, ED: Glucose-Capillary: 84 mg/dL (ref 70–99)

## 2020-09-08 LAB — RESP PANEL BY RT-PCR (FLU A&B, COVID) ARPGX2
Influenza A by PCR: NEGATIVE
Influenza B by PCR: NEGATIVE
SARS Coronavirus 2 by RT PCR: NEGATIVE

## 2020-09-08 MED ORDER — SODIUM CHLORIDE 0.9% FLUSH
3.0000 mL | Freq: Once | INTRAVENOUS | Status: AC
  Administered 2020-09-08: 3 mL via INTRAVENOUS

## 2020-09-08 NOTE — Telephone Encounter (Signed)
FYI. Thanks.

## 2020-09-08 NOTE — ED Provider Notes (Signed)
5:39 PM Assumed care for off going team.   Blood pressure 122/74, pulse 73, temperature 98.4 F (36.9 C), temperature source Oral, resp. rate 15, height 5\' 3"  (1.6 m), weight 49.9 kg, SpO2 98 %.  See their HPI for full report but in brief UA and MRI most likely discharge home if negative  1. No acute intracranial abnormality.  2. Chronic left frontal lobe infarcts.  3. Chronic lacunar infarcts right thalamus and left lentiform  nucleus.    UA without evidence of UTI MRI was negative  5:42 PM reevaluated patient.  Updated on results.  They state they are aware that she is had old strokes.  At this time they denied any concerns and felt comfortable with being discharged home and following up with neurology  I discussed the provisional nature of ED diagnosis, the treatment so far, the ongoing plan of care, follow up appointments and return precautions with the patient and any family or support people present. They expressed understanding and agreed with the plan, discharged home.         Vanessa Lodge, MD 09/08/20 [phone]

## 2020-09-08 NOTE — ED Notes (Addendum)
See triage note. Pt states woke up yesterday morning with baseline tremor worse than normal, vision changes with vision "coming and going and getting blurry" intermittently, and harder to walk than usual. Pt has neuropathy in both legs and has essential tremor. Hx carotid artery blockages. Hx 2 strokes, last stroke was "3 or 4 months ago". Pt believes may have had  TIA about 1 week ago. Pt has hx myasthenia gravis. Pt voice is "shaky" and halted but this is normal for her. Pt crying at this time. VS stable. Husband at bedside.

## 2020-09-08 NOTE — Telephone Encounter (Signed)
Pt states "I had 2 strokes yesterday."   Reports "Off balance yesterday, spinning all day, little better today." States "This is what happens when I have a stroke." Also reports speech "Changed", onset yesterday "When this happens." Speech is halting, pt states "This is not my normal voice." Pt directed to ED, advised EMS, states will go now, husband will drive.  Reason for Disposition . [1] Loss of speech or garbled speech AND [2] sudden onset AND [3] brief (now gone)    Halting speech present , second day.  Protocols used: NEUROLOGIC DEFICIT-A-AH

## 2020-09-08 NOTE — ED Notes (Signed)
Pt husband states pt has had similar episodes every day this week where she wakes up, cannot keep balance, cannot walk because pt is staggering and "room is spinning".  States that pt has a hard time "getting words out" and has some issues with stuttering but this is normal for her.

## 2020-09-08 NOTE — ED Notes (Signed)
Pt to CT

## 2020-09-08 NOTE — ED Notes (Signed)
Assisted pt to the bathroom

## 2020-09-08 NOTE — ED Triage Notes (Signed)
Pt c/o waking with difficulty with speech and balance yesterday morning, pt has uncontrolled tremors in triage but states she does at normally but not this severe.the patient has hx of CAD blockages

## 2020-09-08 NOTE — Telephone Encounter (Signed)
See triage encounter.  Copied from [place] [phone]. Topic: General - Other >> Sep 08, 2020 10:24 AM Tessa Lerner A wrote: Reason for CRM: Patient has called to notify that they will be going [facility] to further address concerns they discussed previously with a member of nursing staff

## 2020-09-08 NOTE — ED Provider Notes (Signed)
[facility] Emergency Department Provider Note  Time seen: 2:02 PM  I have reviewed the triage vital signs and the nursing notes.   HISTORY  Chief Complaint Stroke Symptoms   HPI Alexandria Rodriguez is a 70 y.o. female with a past medical history of anxiety, COPD, chronic pain, gastric reflux, hyperlipidemia, CVA with left-sided deficits, presents to the emergency department for possible stroke.  According to the patient in March she had a stroke involving the left side.  States last night she began having some difficulties with her speech.  Patient says she knows what she wants to say but has difficulty finding the words to do so.  Patient denies any new weakness or numbness of any arm or leg.  Patient states she has a baseline tremor was diagnosed with Parkinson's disease but states her neurologist that since told her that she does not have Parkinson's disease.  States over the past 24 hours she is also had difficulty ambulating due to feeling off balance.   Denies any chest pain abdominal pain nausea vomiting diarrhea or dysuria.  Past Medical History:  Diagnosis Date  . Anxiety   . Cataract   . Cerebrovascular disease   . Chronic pain    back pain  . Contact dermatitis   . COPD (chronic obstructive pulmonary disease) ([place])   . Depression   . Diverticulosis 10/31/10   On Tranverse colon  . GERD (gastroesophageal reflux disease)   . Hyperlipidemia   . Myasthenia gravis ([place]) 02/2013   Diagnosed by doctor Manuella Ghazi  . Neuropathy   . Osteoporosis   . Parkinson's disease (tremor, stiffness, slow motion, unstable posture) ([place])   . Stroke ([place])   . Vitamin D deficiency     Patient Active Problem List   Diagnosis Date Noted  . Carotid artery stenosis 06/18/2020  . History of CVA in adulthood 06/18/2020  . Eustachian tube dysfunction, left 05/11/2020  . Acute bacterial otitis media, left 05/11/2020  . Non-recurrent acute allergic otitis media of left ear 05/11/2020   . Lung nodule 03/15/2018  . Aortic atherosclerosis ([place]) 03/14/2018  . Essential tremor 06/25/2017  . Humerus fracture 04/10/2016  . AD (atopic dermatitis) 06/01/2015  . Back pain, chronic 06/01/2015  . Compression fracture 06/01/2015  . 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 Myasthenia gravis ([place]) 06/01/2015  . TMJ (temporomandibular joint disorder) 12/22/2014  . Diverticulosis 12/22/2014  . Depression 12/22/2014  . GERD (gastroesophageal reflux disease) 12/22/2014  . Fibrocystic breast disease 12/22/2014  . Dyspnea, unspecified 12/22/2014  . Tremor 12/22/2014  . Radiculopathy of cervical region 12/22/2014  . Tobacco use 12/22/2014  . Diffuse cystic mastopathy of breast 12/22/2014  . Diverticulosis of intestine without perforation or abscess without bleeding 12/22/2014  . History of adenomatous polyp of colon 12/04/2014  . Osteoporosis 12/04/2014  . B12 deficiency 11/04/2014  . Polyneuropathy ([place]) 11/03/2014  . Abnormal barium swallow 10/02/2014  . Dysphagia 10/02/2014  . Anxiety 07/31/2013  . Low back pain 07/31/2013  . Vitamin D deficiency 07/31/2013  . COPD (chronic obstructive pulmonary disease) ([place]) 12/27/2012    Past Surgical History:  Procedure Laterality Date  . ABDOMINAL HYSTERECTOMY  2000   due to excessive bleeeding  . CERVICAL CONE BIOPSY    . Cervical discectomy and fusion  [facility]  . COLONOSCOPY WITH PROPOFOL N/A 12/06/2015   Procedure: COLONOSCOPY WITH PROPOFOL;  Surgeon: Manya Silvas, MD;  Location: [facility] ENDOSCOPY;  Service: Endoscopy;  Laterality: N/A;  . ESOPHAGOGASTRODUODENOSCOPY  N/A 10/05/2014   Procedure: ESOPHAGOGASTRODUODENOSCOPY (EGD);  Surgeon: Manya Silvas, MD;  Location: [facility] ENDOSCOPY;  Service: Endoscopy;  Laterality: N/A;  . Repeat cuff surgery  2004   Developed adhesive capsulitis  . ROTATOR CUFF REPAIR Right 2003   Shoulder    Prior to Admission medications   Medication Sig Start Date End Date  Taking? Authorizing Provider  carisoprodol (SOMA) 350 MG tablet TAKE 1 TABLET BY MOUTH TWICE DAILY AS NEEDED 08/03/20   Birdie Sons, MD  Cholecalciferol (VITAMIN D3) 50 MCG (2000 UT) CAPS Take 2 capsules (4,000 Units total) by mouth daily. 05/28/20   Birdie Sons, MD  clopidogrel (PLAVIX) 75 MG tablet Take 75 mg by mouth daily.    [provider]  Cyanocobalamin (B-12 COMPLIANCE INJECTION) 1000 MCG/ML KIT Inject as directed every 30 (thirty) days.    [provider]  ENSTILAR 0.005-0.064 % FOAM Apply  a small amount to skin as directed  qd up to 5 days per week to aa psoriasis on body, avoid face, groin, axilla 05/22/19   [provider]  fluticasone (FLONASE) 50 MCG/ACT nasal spray Place 2 sprays into both nostrils daily. 05/28/20   Birdie Sons, MD  gabapentin (NEURONTIN) 100 MG capsule TAKE 2 CAPSULES(200 MG) BY MOUTH THREE TIMES DAILY IN ADDITION TO 600 MG GABAPENTIN TABLETS 08/28/20   Birdie Sons, MD  gabapentin (NEURONTIN) 600 MG tablet Take 1 tablet (600 mg total) by mouth 2 (two) times daily. 08/03/20   Birdie Sons, MD  nortriptyline (PAMELOR) 10 MG capsule Take 1-2 capsules (10-20 mg total) by mouth at bedtime. 08/03/20   Birdie Sons, MD  rosuvastatin (CRESTOR) 20 MG tablet Take 1 tablet (20 mg total) by mouth daily. 06/11/20   Birdie Sons, MD  triamcinolone ointment (KENALOG) 0.1 % APPLY EXTERNALLY TO THE AFFECTED AREA TWICE DAILY AS NEEDED 11/06/18   Birdie Sons, MD    Allergies  Allergen Reactions  . Effexor Xr [Venlafaxine Hcl Er] Other (See Comments)    Reaction:  GI upset   . Fosamax [Alendronate Sodium] Nausea And Vomiting  . Percocet [Oxycodone-Acetaminophen] Swelling  . Erythromycin Swelling and Rash    Pt states that her tongue swells.    Katrinka Blazing [Lidocaine] Swelling and Rash  . Morphine And Related Swelling, Rash and Other (See Comments)    Pt states that her tongue swells.   Pt states that her tongue swells.    .  Sulfa Antibiotics Swelling and Rash    TONGUE SWELLING  . Sulfasalazine Rash and Swelling  . Tramadol Swelling and Rash    Family History  Problem Relation Age of Onset  . Colon cancer Mother   . Stroke Father   . Cancer Sister        Ovarian, stable  . Breast cancer Maternal Grandmother     Social History Social History   Tobacco Use  . Smoking status: Current Every Day Smoker    Packs/day: 0.75    Years: 42.00    Pack years: 31.50    Types: Cigarettes  . Smokeless tobacco: Never Used  . Tobacco comment:  Has been smoking for 30+ years, Cigarettes per day;10.  Vaping Use  . Vaping Use: Never used  Substance Use Topics  . Alcohol use: No    Alcohol/week: 0.0 standard drinks  . Drug use: No    Review of Systems Constitutional: Negative for fever Cardiovascular: Negative for chest pain. Respiratory: Negative for shortness of breath.  Gastrointestinal: Negative for abdominal pain, vomiting and diarrhea. Genitourinary: Negative for urinary compaints Musculoskeletal: Negative for musculoskeletal complaints Neurological: Negative for headache.  Difficulty with speech and feeling off balance over the past 12 to 24 hours. All other ROS negative  ____________________________________________   PHYSICAL EXAM:  VITAL SIGNS: ED Triage Vitals  Enc Vitals Group     BP 09/08/20 1226 108/67     Pulse Rate 09/08/20 1226 99     Resp 09/08/20 1226 18     Temp 09/08/20 1226 98.4 F (36.9 C)     Temp Source 09/08/20 1226 Oral     SpO2 09/08/20 1226 97 %     Weight 09/08/20 1234 110 lb (49.9 kg)     Height 09/08/20 1234 $RemoveBefor'5\' 3"'[id number]$  (1.6 m)     Head Circumference --      Peak Flow --      Pain Score 09/08/20 1234 0     Pain Loc --      Pain Edu? --      Excl. in [place]? --     Constitutional: Alert and oriented. Well appearing and in no distress. Eyes: Normal exam ENT      Head: Normocephalic and atraumatic.      Mouth/Throat: Mucous membranes are moist. Cardiovascular: Normal  rate, regular rhythm Respiratory: Normal respiratory effort without tachypnea nor retractions. Breath sounds are clear and equal  Gastrointestinal: Soft and nontender. No distention.   Musculoskeletal: Nontender with normal range of motion in all extremities. Neurologic:  Normal speech and language. No gross focal neurologic deficits Skin:  Skin is warm, dry and intact.  Psychiatric: Mood and affect are normal.   ____________________________________________    EKG  EKG viewed and interpreted by myself shows normal sinus rhythm at 96 bpm with a narrow QRS, normal axis, normal intervals with nonspecific ST changes.  ____________________________________________    RADIOLOGY  CT scan shows old infarct but no acute abnormality MRI pending ____________________________________________   INITIAL IMPRESSION / ASSESSMENT AND PLAN / ED COURSE  Pertinent labs & imaging results that were available during my care of the patient were reviewed by me and considered in my medical decision making (see chart for details).   Patient presents emergency department for trouble speaking and feeling off balance since yesterday.  States a history of a stroke in March affecting her left side but not her speech.  Patient is tremulous and states she had a history of Parkinson's but then has been recently told by her neurologist that she does not have Parkinson's.  Patient's lab work is thus far largely nonrevealing, CT scan of the head is negative.  We will proceed with an MRI to further evaluate.  Patient agreeable to plan of care.  Urinalysis pending  MRI pending, patient care signed out to oncoming provider.  Alexandria Rodriguez was evaluated in Emergency Department on 09/08/2020 for the symptoms described in the history of present illness. She was evaluated in the context of the global COVID-19 pandemic, which necessitated consideration that the patient might be at risk for infection with the SARS-CoV-2 virus that  causes COVID-19. Institutional protocols and algorithms that pertain to the evaluation of patients at risk for COVID-19 are in a state of rapid change based on information released by regulatory bodies including the CDC and federal and state organizations. These policies and algorithms were followed during the patient's care in the ED.  ____________________________________________   FINAL CLINICAL IMPRESSION(S) / ED DIAGNOSES  [place],  Lennette Bihari, MD 09/08/20 1434

## 2020-09-08 NOTE — Discharge Instructions (Signed)
Your MRI as below.  She does show some chronic old strokes.  She can follow this up with your neurologist.  Return to the ER for any other concerns   1. No acute intracranial abnormality.  2. Chronic left frontal lobe infarcts.  3. Chronic lacunar infarcts right thalamus and left lentiform  nucleus.

## 2020-09-08 NOTE — ED Notes (Signed)
Propped pt legs up with pillows per pt request to help with neuropathic pain.

## 2020-09-08 NOTE — ED Notes (Signed)
Pt back from CT. EDP at bedside.

## 2020-09-15 ENCOUNTER — Ambulatory Visit: Payer: Medicare Other

## 2020-09-21 ENCOUNTER — Ambulatory Visit (INDEPENDENT_AMBULATORY_CARE_PROVIDER_SITE_OTHER): Payer: Medicare Other | Admitting: Vascular Surgery

## 2020-09-21 ENCOUNTER — Encounter (INDEPENDENT_AMBULATORY_CARE_PROVIDER_SITE_OTHER): Payer: Medicare Other

## 2020-09-21 ENCOUNTER — Encounter (INDEPENDENT_AMBULATORY_CARE_PROVIDER_SITE_OTHER): Payer: Self-pay | Admitting: Vascular Surgery

## 2020-09-22 ENCOUNTER — Ambulatory Visit: Payer: Medicare Other

## 2020-09-24 ENCOUNTER — Other Ambulatory Visit: Payer: Self-pay

## 2020-09-24 ENCOUNTER — Encounter: Payer: Self-pay | Admitting: Family Medicine

## 2020-09-24 ENCOUNTER — Ambulatory Visit (INDEPENDENT_AMBULATORY_CARE_PROVIDER_SITE_OTHER): Payer: Medicare Other | Admitting: Family Medicine

## 2020-09-24 VITALS — BP 101/68 | HR 88 | Wt 111.0 lb

## 2020-09-24 DIAGNOSIS — E538 Deficiency of other specified B group vitamins: Secondary | ICD-10-CM | POA: Diagnosis not present

## 2020-09-24 DIAGNOSIS — H9313 Tinnitus, bilateral: Secondary | ICD-10-CM | POA: Diagnosis not present

## 2020-09-24 MED ORDER — NORTRIPTYLINE HCL 50 MG PO CAPS: 50.0000 mg | ORAL_CAPSULE | Freq: Every day | ORAL | 2 refills | Status: DC

## 2020-09-24 MED ORDER — CYANOCOBALAMIN 1000 MCG/ML IJ SOLN
1000.0000 ug | Freq: Once | INTRAMUSCULAR | Status: AC
  Administered 2020-09-24: 1000 ug via INTRAMUSCULAR

## 2020-09-24 NOTE — Progress Notes (Signed)
    Established patient visit   Patient: Alexandria Rodriguez   DOB: 03/30/1951   69 y.o. Female  MRN: [id number] Visit Date: 09/24/2020  Today's healthcare provider: Donald Fisher, MD   Chief Complaint  Patient presents with   Follow-up    ER follow -up    Weakness   Subjective    HPI HPI     Follow-up    Additional comments: ER follow -up       Last edited by Walsh, Laura E, CMA on 09/24/2020  3:05 PM.      Follow up ER visit  Patient was seen in ER for weakness on 09/08/2020. Per discharge summary, MRI was done and results were reviewed with patient . Patient was discharged home and advised to follow up with neurology.  She reports excellent compliance with treatment. She reports this condition is resolved. She has appt with [facility] neurology on 11-19-2020. She also has appt with vascular surgery 10-19-2020  Her primary complaint today is tinnitus. She has tried very low dose of nortriptyline which she is tolerating well, and seems to help slightly.  -----------------------------------------------------------------------------------------      Medications: Outpatient Medications Prior to Visit  Medication Sig   carisoprodol (SOMA) 350 MG tablet TAKE 1 TABLET BY MOUTH TWICE DAILY AS NEEDED   Cholecalciferol (VITAMIN D3) 50 MCG (2000 UT) CAPS Take 2 capsules (4,000 Units total) by mouth daily.   clopidogrel (PLAVIX) 75 MG tablet Take 75 mg by mouth daily.   Cyanocobalamin (B-12 COMPLIANCE INJECTION) 1000 MCG/ML KIT Inject as directed every 30 (thirty) days.   ENSTILAR 0.005-0.064 % FOAM Apply  a small amount to skin as directed  qd up to 5 days per week to aa psoriasis on body, avoid face, groin, axilla   fluticasone (FLONASE) 50 MCG/ACT nasal spray Place 2 sprays into both nostrils daily.   gabapentin (NEURONTIN) 100 MG capsule TAKE 2 CAPSULES(200 MG) BY MOUTH THREE TIMES DAILY IN ADDITION TO 600 MG GABAPENTIN TABLETS   gabapentin (NEURONTIN) 600 MG tablet Take 1 tablet (600  mg total) by mouth 2 (two) times daily.   nortriptyline (PAMELOR) 10 MG capsule Take 1-2 capsules (10-20 mg total) by mouth at bedtime.   rosuvastatin (CRESTOR) 20 MG tablet Take 1 tablet (20 mg total) by mouth daily.   triamcinolone ointment (KENALOG) 0.1 % APPLY EXTERNALLY TO THE AFFECTED AREA TWICE DAILY AS NEEDED   No facility-administered medications prior to visit.    Review of Systems  Constitutional: Negative.  Negative for appetite change, chills, fatigue and fever.  HENT:  Positive for tinnitus. Negative for congestion.   Respiratory: Negative.  Negative for chest tightness and shortness of breath.   Cardiovascular: Negative.  Negative for chest pain and palpitations.  Gastrointestinal: Negative.  Negative for abdominal pain, nausea and vomiting.  Neurological:  Positive for dizziness and tremors. Negative for weakness, light-headedness and headaches.     Objective    BP 101/68 (BP Location: Right Arm, Patient Position: Sitting, Cuff Size: Normal)   Pulse 88   Wt 111 lb (50.3 kg)   SpO2 98%   BMI 19.66 kg/m    Physical Exam   General: Appearance:    Thin female in no acute distress  Eyes:    PERRL, conjunctiva/corneas clear, EOM's intact       Lungs:     Clear to auscultation bilaterally, respirations unlabored  Heart:    Normal heart rate. Normal rhythm. No murmurs, rubs, or gallops.    MS:     All extremities are intact.    Neurologic:   Awake, alert, oriented x 3. No apparent focal neurological           defect.         Assessment & Plan     1. Tinnitus of both ears Slight improvement with 10-44m nortriptyline. Will try increasing to - nortriptyline (PAMELOR) 50 MG capsule; Take 1 capsule (50 mg total) by mouth at bedtime.  Dispense: 30 capsule; Refill: 2  2. B12 deficiency - cyanocobalamin ((VITAMIN B-12)) injection 1,000 mcg   Future Appointments  Date Time Provider DOlney 10/19/2020  3:00 PM [facility] VASC 1 [facility]-IMG None  10/19/2020  4:00 PM Dew,  JErskine Squibb MD [facility]-AV[facility] None  11/03/2020 11:00 AM BFP-BFP NURSE BFP-BFP PEC  12/16/2020 10:00 AM BFP-NURSE HEALTH ADVISOR BFP-BFP PEC  03/29/2021  1:40 PM Tyann Niehaus, DKirstie Peri MD BFP-BFP PEC         The entirety of the information documented in the History of Present Illness, Review of Systems and Physical Exam were personally obtained by me. Portions of this information were initially documented by the CMA and reviewed by me for thoroughness and accuracy.     DLelon Huh MD  [phone](phone) [phone](fax)  [place]

## 2020-10-06 ENCOUNTER — Other Ambulatory Visit: Payer: Self-pay | Admitting: Family Medicine

## 2020-10-19 ENCOUNTER — Encounter (INDEPENDENT_AMBULATORY_CARE_PROVIDER_SITE_OTHER): Payer: Medicare Other

## 2020-10-19 ENCOUNTER — Ambulatory Visit (INDEPENDENT_AMBULATORY_CARE_PROVIDER_SITE_OTHER): Payer: Medicare Other | Admitting: Vascular Surgery

## 2020-11-03 ENCOUNTER — Ambulatory Visit (INDEPENDENT_AMBULATORY_CARE_PROVIDER_SITE_OTHER): Payer: Medicare Other

## 2020-11-03 ENCOUNTER — Other Ambulatory Visit: Payer: Self-pay | Admitting: Family Medicine

## 2020-11-03 ENCOUNTER — Other Ambulatory Visit: Payer: Self-pay

## 2020-11-03 DIAGNOSIS — E538 Deficiency of other specified B group vitamins: Secondary | ICD-10-CM

## 2020-11-03 MED ORDER — CYANOCOBALAMIN 1000 MCG/ML IJ SOLN
1000.0000 ug | Freq: Once | INTRAMUSCULAR | Status: AC
  Administered 2020-11-03: 1000 ug via INTRAMUSCULAR

## 2020-11-03 NOTE — Telephone Encounter (Signed)
  Notes to clinic:  medication filled by a historical provider  Review for refill    Requested Prescriptions  Pending Prescriptions Disp Refills   clopidogrel (PLAVIX) 75 MG tablet [Pharmacy Med Name: CLOPIDOGREL '75MG'$  TABLETS] 30 tablet     Sig: TAKE 1 TABLET(75 MG) BY MOUTH DAILY      Hematology: Antiplatelets - clopidogrel Failed - 11/03/2020  8:36 AM      Failed - Evaluate AST, ALT within 2 months of therapy initiation.      Passed - ALT in normal range and within 360 days    ALT  Date Value Ref Range Status  09/08/2020 14 0 - 44 U/L Final   SGPT (ALT)  Date Value Ref Range Status  05/13/2013 22 12 - 78 U/L Final          Passed - AST in normal range and within 360 days    AST  Date Value Ref Range Status  09/08/2020 16 15 - 41 U/L Final   SGOT(AST)  Date Value Ref Range Status  05/13/2013 28 15 - 37 Unit/L Final          Passed - HCT in normal range and within 180 days    HCT  Date Value Ref Range Status  09/08/2020 37.4 36.0 - 46.0 % Final   Hematocrit  Date Value Ref Range Status  05/11/2020 37.3 34.0 - 46.6 % Final          Passed - HGB in normal range and within 180 days    Hemoglobin  Date Value Ref Range Status  09/08/2020 12.5 12.0 - 15.0 g/dL Final  05/11/2020 12.6 11.1 - 15.9 g/dL Final          Passed - PLT in normal range and within 180 days    Platelets  Date Value Ref Range Status  09/08/2020 151 150 - 400 K/uL Final  05/11/2020 176 150 - 450 x10E3/uL Final          Passed - Valid encounter within last 6 months    Recent Outpatient Visits           1 month ago B12 deficiency   [facility] Birdie Sons, MD   3 months ago B12 deficiency   [facility] Birdie Sons, MD   4 months ago History of CVA in adulthood   [facility] Birdie Sons, MD   5 months ago Transient neurologic deficit   [facility] Birdie Sons, MD   5 months ago Acute bacterial otitis  media, left   [place], Kelby Aline, FNP       Future Appointments             In 4 months Fisher, Kirstie Peri, MD [facility], [place]

## 2020-11-03 NOTE — Telephone Encounter (Signed)
Copied from [place] [id number]. Topic: Quick Communication - Rx Refill/Question >> Nov 03, 2020  4:18 PM Leward Quan A wrote: Medication: clopidogrel (PLAVIX) 75 MG tablet   Has the patient contacted their pharmacy? Yes.   (Agent: If no, request that the patient contact the pharmacy for the refill.) (Agent: If yes, when and what did the pharmacy advise?)  Preferred Pharmacy (with phone number or street name): [facility] [place], [place] - [address]  Phone:  [phone] Fax:  [phone]     Agent: Please be advised that RX refills may take up to 3 business days. We ask that you follow-up with your pharmacy.

## 2020-11-15 ENCOUNTER — Telehealth: Payer: Self-pay

## 2020-11-15 NOTE — Telephone Encounter (Signed)
Copied from [place] [id number]. Topic: Appointment Scheduling - Scheduling Inquiry for Clinic >> Nov 15, 2020  1:22 PM Pawlus, Brayton Layman A wrote: Reason for CRM: Pt stated she continues to have ringing in her ears, headaches and nothing has seemed to work. Pt wanted to know if she could be worked in this week to be seen since the pt believes she might need a referral to a ENT specialist, please advise.

## 2020-11-16 ENCOUNTER — Emergency Department: Payer: Medicare Other

## 2020-11-16 ENCOUNTER — Ambulatory Visit: Payer: Self-pay | Admitting: *Deleted

## 2020-11-16 ENCOUNTER — Emergency Department
Admission: EM | Admit: 2020-11-16 | Discharge: 2020-11-16 | Disposition: A | Discharge status: Home / Self Care | Payer: Medicare Other | Attending: Emergency Medicine | Admitting: Emergency Medicine

## 2020-11-16 ENCOUNTER — Other Ambulatory Visit: Payer: Self-pay

## 2020-11-16 DIAGNOSIS — G20 Parkinson's disease: Secondary | ICD-10-CM | POA: Insufficient documentation

## 2020-11-16 DIAGNOSIS — F17210 Nicotine dependence, cigarettes, uncomplicated: Secondary | ICD-10-CM | POA: Insufficient documentation

## 2020-11-16 DIAGNOSIS — R519 Headache, unspecified: Secondary | ICD-10-CM | POA: Diagnosis not present

## 2020-11-16 DIAGNOSIS — J449 Chronic obstructive pulmonary disease, unspecified: Secondary | ICD-10-CM | POA: Insufficient documentation

## 2020-11-16 LAB — CBC
HCT: 39.1 % (ref 36.0–46.0)
Hemoglobin: 13.2 g/dL (ref 12.0–15.0)
MCH: 33.2 pg (ref 26.0–34.0)
MCHC: 33.8 g/dL (ref 30.0–36.0)
MCV: 98.5 fL (ref 80.0–100.0)
Platelets: 122 K/uL — ABNORMAL LOW (ref 150–400)
RBC: 3.97 MIL/uL (ref 3.87–5.11)
RDW: 13.1 % (ref 11.5–15.5)
WBC: 3.7 K/uL — ABNORMAL LOW (ref 4.0–10.5)
nRBC: 0 % (ref 0.0–0.2)

## 2020-11-16 LAB — BASIC METABOLIC PANEL
Anion gap: 7 (ref 5–15)
BUN: 9 mg/dL (ref 8–23)
CO2: 25 mmol/L (ref 22–32)
Calcium: 8.4 mg/dL — ABNORMAL LOW (ref 8.9–10.3)
Chloride: 103 mmol/L (ref 98–111)
Creatinine, Ser: 0.84 mg/dL (ref 0.44–1.00)
GFR, Estimated: >60 mL/min (ref >60)
Glucose, Bld: 104 mg/dL — ABNORMAL HIGH (ref 70–99)
Potassium: 3.8 mmol/L (ref 3.5–5.1)
Sodium: 135 mmol/L (ref 135–145)

## 2020-11-16 MED ORDER — PROCHLORPERAZINE EDISYLATE 10 MG/2ML IJ SOLN
10.0000 mg | Freq: Once | INTRAMUSCULAR | Status: AC
  Administered 2020-11-16: 10 mg via INTRAVENOUS
  Filled 2020-11-16: qty 2

## 2020-11-16 MED ORDER — SODIUM CHLORIDE 0.9 % IV BOLUS
1000.0000 mL | Freq: Once | INTRAVENOUS | Status: AC
  Administered 2020-11-16: 1000 mL via INTRAVENOUS

## 2020-11-16 MED ORDER — DIPHENHYDRAMINE HCL 50 MG/ML IJ SOLN
25.0000 mg | Freq: Once | INTRAMUSCULAR | Status: AC
  Administered 2020-11-16: 25 mg via INTRAVENOUS
  Filled 2020-11-16: qty 1

## 2020-11-16 MED ORDER — PROCHLORPERAZINE MALEATE 10 MG PO TABS: 10.0000 mg | ORAL_TABLET | Freq: Four times a day (QID) | ORAL | 0 refills | Status: DC | PRN

## 2020-11-16 MED ORDER — KETOROLAC TROMETHAMINE 30 MG/ML IJ SOLN
15.0000 mg | Freq: Once | INTRAMUSCULAR | Status: AC
  Administered 2020-11-16: 15 mg via INTRAVENOUS
  Filled 2020-11-16: qty 1

## 2020-11-16 NOTE — ED Triage Notes (Signed)
Patient reports headache since Saturday. Pt denies h/x headache. Pt denies falls or thinners. Pt has been taking extra strength tylenol for pain without relief.

## 2020-11-16 NOTE — Telephone Encounter (Signed)
Call to patient- patient is crying in pain from 2 day headache- she states it hurts when she moves. Patient states she has blisters on her neck from the pain patch. Call to office to see if someone clinical that is familiar with patient can convince her she needs to go to ED-  Patient declines ED disposition- spoke to patient's husband and he will try to get her to agree to go.

## 2020-11-16 NOTE — Telephone Encounter (Signed)
See separate phone message. Patients husband called back today and spoke with [facility] triage nurse. Triage nurse advised patient's husband that she needs to go to ER.

## 2020-11-16 NOTE — Telephone Encounter (Signed)
Patient husband called in to say that patient continues to have ringing in her ears, headaches and nothing has seemed to work. Been going on for about three days.  Please call Ph# [phone]  Reason for Disposition  [1] SEVERE headache (e.g., excruciating) AND [2] "worst headache" of life  Answer Assessment - Initial Assessment Questions 1. LOCATION: "Where does it hurt?"      Pain under scalp- eyebrows 2. ONSET: "When did the headache start?" (Minutes, hours or days)      2 days- has gotten worse 3. PATTERN: "Does the pain come and go, or has it been constant since it started?"     Comes and goes- if she is still it may stop- movement makes it worse 4. SEVERITY: "How bad is the pain?" and "What does it keep you from doing?"  (e.g., Scale 1-10; mild, moderate, or severe)   - MILD (1-3): doesn't interfere with normal activities    - MODERATE (4-7): interferes with normal activities or awakens from sleep    - SEVERE (8-10): excruciating pain, unable to do any normal activities        severe 5. RECURRENT SYMPTOM: "Have you ever had headaches before?" If Yes, ask: "When was the last time?" and "What happened that time?"      Yes- not this bad 6. CAUSE: "What do you think is causing the headache?"     Not sure 7. MIGRAINE: "Have you been diagnosed with migraine headaches?" If Yes, ask: "Is this headache similar?"      No- not like migraine 8. HEAD INJURY: "Has there been any recent injury to the head?"      no 9. OTHER SYMPTOMS: "Do you have any other symptoms?" (fever, stiff neck, eye pain, sore throat, cold symptoms)     Ears ringing, neck has whelps- pain patch 10. PREGNANCY: "Is there any chance you are pregnant?" "When was your last menstrual period?"       N/a  Protocols used: Headache-A-AH

## 2020-11-16 NOTE — ED Provider Notes (Signed)
[facility] Emergency Department Provider Note   ____________________________________________   Event Date/Time   First MD Initiated Contact with Patient 11/16/20 1641     (approximate)  I have reviewed the triage vital signs and the nursing notes.   HISTORY  Chief Complaint Headache    HPI Alexandria Rodriguez is a 70 y.o. female with past medical history of hyperlipidemia, COPD, GERD, stroke, depression, and tremor who presents to the ED for headache.  Patient reports that she has had 2 days of gradually worsening headache affecting primarily her occiput but also both sides of her head.  Pain has been partially alleviated by Tylenol but this is no longer working as well as symptoms have become more severe.  She deals with a chronic tremor but denies any changes in this, has not had any changes in her vision, and denies any numbness or weakness.  She denies any history of migraines, symptoms or not exacerbated by bright lights and she denies any nausea or vomiting.  She denies any fevers, cough, chest pain, or shortness of breath.        Past Medical History:  Diagnosis Date   Anxiety    Cataract    Cerebrovascular disease    Chronic pain    back pain   Contact dermatitis    COPD (chronic obstructive pulmonary disease) ([place])    Depression    Diverticulosis 10/31/10   On Tranverse colon   GERD (gastroesophageal reflux disease)    Hyperlipidemia    Myasthenia gravis ([place]) 02/2013   Diagnosed by doctor Manuella Ghazi   Neuropathy    Osteoporosis    Parkinson's disease (tremor, stiffness, slow motion, unstable posture) ([place])    Stroke ([place])    Vitamin D deficiency     Patient Active Problem List   Diagnosis Date Noted   Carotid artery stenosis 06/18/2020   History of CVA in adulthood 06/18/2020   Eustachian tube dysfunction, left 05/11/2020   Acute bacterial otitis media, left 05/11/2020   Non-recurrent acute allergic otitis media of left ear 05/11/2020    Lung nodule 03/15/2018   Aortic atherosclerosis ([place]) 03/14/2018   Essential tremor 06/25/2017   Humerus fracture 04/10/2016   AD (atopic dermatitis) 06/01/2015   Back pain, chronic 06/01/2015   Compression fracture 06/01/2015   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   Myasthenia gravis ([place]) 06/01/2015   TMJ (temporomandibular joint disorder) 12/22/2014   Diverticulosis 12/22/2014   Depression 12/22/2014   GERD (gastroesophageal reflux disease) 12/22/2014   Fibrocystic breast disease 12/22/2014   Dyspnea, unspecified 12/22/2014   Tremor 12/22/2014   Radiculopathy of cervical region 12/22/2014   Tobacco use 12/22/2014   Diffuse cystic mastopathy of breast 12/22/2014   Diverticulosis of intestine without perforation or abscess without bleeding 12/22/2014   History of adenomatous polyp of colon 12/04/2014   Osteoporosis 12/04/2014   B12 deficiency 11/04/2014   Polyneuropathy ([place]) 11/03/2014   Abnormal barium swallow 10/02/2014   Dysphagia 10/02/2014   Anxiety 07/31/2013   Low back pain 07/31/2013   Vitamin D deficiency 07/31/2013   COPD (chronic obstructive pulmonary disease) ([place]) 12/27/2012    Past Surgical History:  Procedure Laterality Date   ABDOMINAL HYSTERECTOMY  2000   due to excessive bleeeding   CERVICAL CONE BIOPSY     Cervical discectomy and fusion  [facility]   COLONOSCOPY WITH PROPOFOL N/A 12/06/2015   Procedure: COLONOSCOPY WITH PROPOFOL;  Surgeon: Manya Silvas, MD;  Location: [facility] ENDOSCOPY;  Service: Endoscopy;  Laterality: N/A;   ESOPHAGOGASTRODUODENOSCOPY N/A 10/05/2014   Procedure: ESOPHAGOGASTRODUODENOSCOPY (EGD);  Surgeon: Manya Silvas, MD;  Location: [facility] ENDOSCOPY;  Service: Endoscopy;  Laterality: N/A;   Repeat cuff surgery  2004   Developed adhesive capsulitis   ROTATOR CUFF REPAIR Right 2003   Shoulder    Prior to Admission medications   Medication Sig Start Date End Date Taking? Authorizing Provider   prochlorperazine (COMPAZINE) 10 MG tablet Take 1 tablet (10 mg total) by mouth every 6 (six) hours as needed for nausea or vomiting (or headache). 11/16/20  Yes Blake Divine, MD  carisoprodol (SOMA) 350 MG tablet TAKE 1 TABLET BY MOUTH TWICE DAILY AS NEEDED 08/03/20   Birdie Sons, MD  Cholecalciferol (VITAMIN D3) 50 MCG (2000 UT) CAPS Take 2 capsules (4,000 Units total) by mouth daily. 05/28/20   Birdie Sons, MD  clopidogrel (PLAVIX) 75 MG tablet TAKE 1 TABLET(75 MG) BY MOUTH DAILY 11/03/20   Birdie Sons, MD  Cyanocobalamin (B-12 COMPLIANCE INJECTION) 1000 MCG/ML KIT Inject as directed every 30 (thirty) days.    [provider]  ENSTILAR 0.005-0.064 % FOAM Apply  a small amount to skin as directed  qd up to 5 days per week to aa psoriasis on body, avoid face, groin, axilla 05/22/19   [provider]  fluticasone (FLONASE) 50 MCG/ACT nasal spray Place 2 sprays into both nostrils daily. 05/28/20   Birdie Sons, MD  gabapentin (NEURONTIN) 100 MG capsule TAKE 2 CAPSULES(200 MG) BY MOUTH THREE TIMES DAILY IN ADDITION TO 600 MG GABAPENTIN TABLETS 08/28/20   Birdie Sons, MD  gabapentin (NEURONTIN) 600 MG tablet Take 1 tablet (600 mg total) by mouth 2 (two) times daily. 08/03/20   Birdie Sons, MD  nortriptyline (PAMELOR) 50 MG capsule Take 1 capsule (50 mg total) by mouth at bedtime. 09/24/20   Birdie Sons, MD  rosuvastatin (CRESTOR) 20 MG tablet Take 1 tablet (20 mg total) by mouth daily. 06/11/20   Birdie Sons, MD  triamcinolone ointment (KENALOG) 0.1 % APPLY EXTERNALLY TO THE AFFECTED AREA TWICE DAILY AS NEEDED 11/06/18   Birdie Sons, MD    Allergies Effexor xr [venlafaxine hcl er], Fosamax [alendronate sodium], Percocet [oxycodone-acetaminophen], Erythromycin, Lidoderm [lidocaine], Morphine and related, Sulfa antibiotics, Sulfasalazine, and Tramadol  Family History  Problem Relation Age of Onset   Colon cancer Mother    Stroke Father    Cancer  Sister        Ovarian, stable   Breast cancer Maternal Grandmother     Social History Social History   Tobacco Use   Smoking status: Every Day    Packs/day: 0.50    Years: 42.00    Pack years: 21.00    Types: Cigarettes   Smokeless tobacco: Never   Tobacco comments:     Has been smoking for 30+ years, Cigarettes per day;10.  Vaping Use   Vaping Use: Never used  Substance Use Topics   Alcohol use: No    Alcohol/week: 0.0 standard drinks   Drug use: No    Review of Systems  Constitutional: No fever/chills Eyes: No visual changes. ENT: No sore throat. Cardiovascular: Denies chest pain. Respiratory: Denies shortness of breath. Gastrointestinal: No abdominal pain.  No nausea, no vomiting.  No diarrhea.  No constipation. Genitourinary: Negative for dysuria. Musculoskeletal: Negative for back pain. Skin: Negative for rash. Neurological: Positive for headaches, negative for focal weakness or numbness.  ____________________________________________   PHYSICAL EXAM:  VITAL SIGNS: ED  Triage Vitals  Enc Vitals Group     BP 11/16/20 1622 106/77     Pulse Rate 11/16/20 1622 98     Resp 11/16/20 1622 20     Temp 11/16/20 1622 98.1 F (36.7 C)     Temp Source 11/16/20 1622 Oral     SpO2 11/16/20 1622 99 %     Weight 11/16/20 1623 115 lb (52.2 kg)     Height 11/16/20 1623 _0  (1.6 m)     Head Circumference --      Peak Flow --      Pain Score 11/16/20 1622 10     Pain Loc --      Pain Edu? --      Excl. in [place]? --     Constitutional: Alert and oriented. Eyes: Conjunctivae are normal.  Pupils equal, round, and reactive to light bilaterally. Head: Atraumatic.  No temporal tenderness noted. Nose: No congestion/rhinnorhea. Mouth/Throat: Mucous membranes are moist. Neck: Normal ROM, no midline cervical spine tenderness to palpation, no meningismus noted. Cardiovascular: Normal rate, regular rhythm. Grossly normal heart sounds. Respiratory: Normal respiratory effort.   No retractions. Lungs CTAB. Gastrointestinal: Soft and nontender. No distention. Genitourinary: deferred Musculoskeletal: No lower extremity tenderness nor edema. Neurologic:  Normal speech and language. No gross focal neurologic deficits are appreciated. Skin:  Skin is warm, dry and intact.  Atopic dermatitis noted chronic per patient. Psychiatric: Mood and affect are normal. Speech and behavior are normal.  ____________________________________________   LABS (all labs ordered are listed, but only abnormal results are displayed)  Labs Reviewed  CBC - Abnormal; Notable for the following components:      Result Value   WBC 3.7 (*)    Platelets 122 (*)    All other components within normal limits  BASIC METABOLIC PANEL - Abnormal; Notable for the following components:   Glucose, Bld 104 (*)    Calcium 8.4 (*)    All other components within normal limits    PROCEDURES  Procedure(s) performed (including Critical Care):  Procedures   ____________________________________________   INITIAL IMPRESSION / ASSESSMENT AND PLAN / ED COURSE      70 year old female with past medical history of hyperlipidemia, COPD, GERD, stroke, tremor, and depression who presents to the ED with gradually worsening headache for the past 2 to 3 days.  Low overall suspicion for [facility] given gradually worsening symptoms for the past 2 to 3 days, but we will screen CT head given patient's advanced age.  She is afebrile and has no meningismus to suggest a meningitis.  No temporal tenderness noted and patient with no focal neurologic deficits on exam.  We will screen basic labs and treat with migraine cocktail, reassess.  Patient with minimal improvement in headache following migraine cocktail, she was given dose of IV Toradol with significant improvement.  Labs are unremarkable.  She is appropriate for discharge home with neurology follow-up, will be prescribed Compazine for use as needed at home.  She was  counseled to return to the ED for new worsening symptoms, patient agrees with plan.      ____________________________________________   FINAL CLINICAL IMPRESSION(S) / ED DIAGNOSES  Final diagnoses:  Acute nonintractable headache, unspecified headache type     ED Discharge Orders          Ordered    prochlorperazine (COMPAZINE) 10 MG tablet  Every 6 hours PRN        11/16/20 1853  Note:  This document was prepared using Dragon voice recognition software and may include unintentional dictation errors.    Blake Divine, MD 11/16/20 [phone]

## 2020-11-24 ENCOUNTER — Ambulatory Visit: Payer: Self-pay

## 2020-11-25 ENCOUNTER — Other Ambulatory Visit: Payer: Self-pay | Admitting: Family Medicine

## 2020-11-25 NOTE — Telephone Encounter (Signed)
Future visit in 4 months 

## 2020-12-01 ENCOUNTER — Ambulatory Visit (INDEPENDENT_AMBULATORY_CARE_PROVIDER_SITE_OTHER): Payer: Medicare Other

## 2020-12-01 ENCOUNTER — Other Ambulatory Visit: Payer: Self-pay

## 2020-12-01 DIAGNOSIS — E538 Deficiency of other specified B group vitamins: Secondary | ICD-10-CM | POA: Diagnosis not present

## 2020-12-01 MED ORDER — CYANOCOBALAMIN 1000 MCG/ML IJ SOLN
1000.0000 ug | Freq: Once | INTRAMUSCULAR | Status: AC
  Administered 2020-12-01: 1000 ug via INTRAMUSCULAR

## 2020-12-15 ENCOUNTER — Other Ambulatory Visit: Payer: Self-pay | Admitting: Family Medicine

## 2020-12-15 DIAGNOSIS — H9313 Tinnitus, bilateral: Secondary | ICD-10-CM

## 2020-12-17 ENCOUNTER — Other Ambulatory Visit: Payer: Self-pay | Admitting: Family Medicine

## 2020-12-17 NOTE — Telephone Encounter (Signed)
Requested medication (s) are due for refill today - yes  Requested medication (s) are on the active medication list -yes  Future visit scheduled -yes  Last refill: 08/03/20 #60 3RF  Notes to clinic: Request RF: non delegated Rx  Requested Prescriptions  Pending Prescriptions Disp Refills   carisoprodol (SOMA) 350 MG tablet [Pharmacy Med Name: CARISOPRODOL '350MG'$  TABLETS] 60 tablet     Sig: TAKE 1 TABLET BY MOUTH TWICE DAILY AS NEEDED     Not Delegated - Analgesics:  Muscle Relaxants Failed - 12/17/2020 12:12 PM      Failed - This refill cannot be delegated      Passed - Valid encounter within last 6 months    Recent Outpatient Visits           2 months ago B12 deficiency   [facility] Birdie Sons, MD   4 months ago B12 deficiency   [facility] Birdie Sons, MD   5 months ago History of CVA in adulthood   [place], Kirstie Peri, MD   6 months ago Transient neurologic deficit   [facility] Birdie Sons, MD   7 months ago Acute bacterial otitis media, left   [facility] [place], Kelby Aline, FNP       Future Appointments             In 3 months Fisher, Kirstie Peri, MD [facility], [place]               Requested Prescriptions  Pending Prescriptions Disp Refills   carisoprodol (SOMA) 350 MG tablet [Pharmacy Med Name: CARISOPRODOL '350MG'$  TABLETS] 60 tablet     Sig: TAKE 1 TABLET BY MOUTH TWICE DAILY AS NEEDED     Not Delegated - Analgesics:  Muscle Relaxants Failed - 12/17/2020 12:12 PM      Failed - This refill cannot be delegated      Passed - Valid encounter within last 6 months    Recent Outpatient Visits           2 months ago B12 deficiency   [facility] Birdie Sons, MD   4 months ago B12 deficiency   [facility] Birdie Sons, MD   5 months ago History of CVA in adulthood   [facility] Birdie Sons,  MD   6 months ago Transient neurologic deficit   [facility] Birdie Sons, MD   7 months ago Acute bacterial otitis media, left   [facility] [place], Kelby Aline, FNP       Future Appointments             In 3 months Fisher, Kirstie Peri, MD [facility], [place]

## 2020-12-17 NOTE — Telephone Encounter (Signed)
LOV:  09/24/2020 NOV: 03/29/2021   Last Refill: 08/03/2020 #60 # Refills.

## 2020-12-18 ENCOUNTER — Other Ambulatory Visit: Payer: Self-pay | Admitting: Family Medicine

## 2020-12-18 DIAGNOSIS — I700 Atherosclerosis of aorta: Secondary | ICD-10-CM

## 2020-12-18 NOTE — Telephone Encounter (Signed)
Requested Prescriptions  Pending Prescriptions Disp Refills  . rosuvastatin (CRESTOR) 20 MG tablet [Pharmacy Med Name: ROSUVASTATIN '20MG'$  TABLETS] 30 tablet 2    Sig: TAKE 1 TABLET(20 MG) BY MOUTH DAILY     Cardiovascular:  Antilipid - Statins Passed - 12/18/2020 12:41 PM      Passed - Total Cholesterol in normal range and within 360 days    Cholesterol, Total  Date Value Ref Range Status  03/12/2020 118 100 - 199 mg/dL Final   Cholesterol  Date Value Ref Range Status  05/14/2013 138 0 - 200 mg/dL Final         Passed - LDL in normal range and within 360 days    Ldl Cholesterol, Calc  Date Value Ref Range Status  05/14/2013 85 0 - 100 mg/dL Final   LDL Chol Calc (NIH)  Date Value Ref Range Status  03/12/2020 45 0 - 99 mg/dL Final         Passed - HDL in normal range and within 360 days    HDL Cholesterol  Date Value Ref Range Status  05/14/2013 36 (L) 40 - 60 mg/dL Final   HDL  Date Value Ref Range Status  03/12/2020 60 >39 mg/dL Final         Passed - Triglycerides in normal range and within 360 days    Triglycerides  Date Value Ref Range Status  03/12/2020 57 0 - 149 mg/dL Final  05/14/2013 84 0 - 200 mg/dL Final         Passed - Patient is not pregnant      Passed - Valid encounter within last 12 months    Recent Outpatient Visits          2 months ago B12 deficiency   [facility] Birdie Sons, MD   5 months ago B12 deficiency   [facility] Birdie Sons, MD   5 months ago History of CVA in adulthood   [facility] Birdie Sons, MD   6 months ago Transient neurologic deficit   [facility] Birdie Sons, MD   7 months ago Acute bacterial otitis media, left   [place], Kelby Aline, FNP      Future Appointments            In 3 months Fisher, Kirstie Peri, MD [facility], [place]

## 2020-12-29 ENCOUNTER — Other Ambulatory Visit: Payer: Self-pay

## 2020-12-29 ENCOUNTER — Ambulatory Visit (INDEPENDENT_AMBULATORY_CARE_PROVIDER_SITE_OTHER): Payer: Medicare Other

## 2020-12-29 DIAGNOSIS — E538 Deficiency of other specified B group vitamins: Secondary | ICD-10-CM

## 2020-12-29 MED ORDER — CYANOCOBALAMIN 1000 MCG/ML IJ SOLN
1000.0000 ug | Freq: Once | INTRAMUSCULAR | Status: AC
  Administered 2020-12-29: 1000 ug via INTRAMUSCULAR

## 2021-01-26 ENCOUNTER — Ambulatory Visit: Payer: Medicare Other

## 2021-02-02 ENCOUNTER — Ambulatory Visit (INDEPENDENT_AMBULATORY_CARE_PROVIDER_SITE_OTHER): Payer: Medicare Other

## 2021-02-02 ENCOUNTER — Other Ambulatory Visit: Payer: Self-pay

## 2021-02-02 DIAGNOSIS — E538 Deficiency of other specified B group vitamins: Secondary | ICD-10-CM | POA: Diagnosis not present

## 2021-02-02 DIAGNOSIS — Z23 Encounter for immunization: Secondary | ICD-10-CM | POA: Diagnosis not present

## 2021-02-02 MED ORDER — CYANOCOBALAMIN 1000 MCG/ML IJ SOLN
1000.0000 ug | Freq: Once | INTRAMUSCULAR | Status: AC
  Administered 2021-02-02: 1000 ug via INTRAMUSCULAR

## 2021-02-23 ENCOUNTER — Ambulatory Visit (INDEPENDENT_AMBULATORY_CARE_PROVIDER_SITE_OTHER): Payer: Medicare Other | Admitting: Physician Assistant

## 2021-02-23 ENCOUNTER — Other Ambulatory Visit: Payer: Self-pay

## 2021-02-23 DIAGNOSIS — E538 Deficiency of other specified B group vitamins: Secondary | ICD-10-CM | POA: Diagnosis not present

## 2021-02-23 MED ORDER — CYANOCOBALAMIN 1000 MCG/ML IJ SOLN
1000.0000 ug | Freq: Once | INTRAMUSCULAR | Status: AC
  Administered 2021-02-23: 1000 ug via INTRAMUSCULAR

## 2021-02-28 ENCOUNTER — Telehealth: Payer: Self-pay | Admitting: Family Medicine

## 2021-02-28 MED ORDER — GABAPENTIN 100 MG PO CAPS: ORAL_CAPSULE | ORAL | 2 refills | Status: DC

## 2021-02-28 NOTE — Telephone Encounter (Signed)
[place] faxed refill request for the following medications:  gabapentin (NEURONTIN) 100 MG tablet   Please advise.

## 2021-02-28 NOTE — Telephone Encounter (Signed)
Last refill: 11/25/2020 #180 with 2 refills   Last office visit: 09/24/2020 (seen for b12 deficiency and tinnitus)  Next office visit: 03/29/2021

## 2021-03-23 ENCOUNTER — Ambulatory Visit (INDEPENDENT_AMBULATORY_CARE_PROVIDER_SITE_OTHER): Payer: Medicare Other

## 2021-03-23 ENCOUNTER — Other Ambulatory Visit: Payer: Self-pay

## 2021-03-23 DIAGNOSIS — E538 Deficiency of other specified B group vitamins: Secondary | ICD-10-CM | POA: Diagnosis not present

## 2021-03-23 MED ORDER — CYANOCOBALAMIN 1000 MCG/ML IJ SOLN
1000.0000 ug | Freq: Once | INTRAMUSCULAR | Status: AC
  Administered 2021-03-23: 11:00:00 1000 ug via INTRAMUSCULAR

## 2021-03-29 ENCOUNTER — Encounter: Payer: Self-pay | Admitting: Family Medicine

## 2021-03-29 ENCOUNTER — Other Ambulatory Visit: Payer: Self-pay

## 2021-03-29 ENCOUNTER — Ambulatory Visit (INDEPENDENT_AMBULATORY_CARE_PROVIDER_SITE_OTHER): Payer: Medicare Other | Admitting: Family Medicine

## 2021-03-29 VITALS — BP 131/81 | HR 87 | Temp 98.4°F | Resp 16 | Wt 122.0 lb

## 2021-03-29 DIAGNOSIS — G629 Polyneuropathy, unspecified: Secondary | ICD-10-CM | POA: Diagnosis not present

## 2021-03-29 DIAGNOSIS — E538 Deficiency of other specified B group vitamins: Secondary | ICD-10-CM

## 2021-03-29 DIAGNOSIS — H9313 Tinnitus, bilateral: Secondary | ICD-10-CM

## 2021-03-29 DIAGNOSIS — E559 Vitamin D deficiency, unspecified: Secondary | ICD-10-CM | POA: Diagnosis not present

## 2021-03-29 DIAGNOSIS — R251 Tremor, unspecified: Secondary | ICD-10-CM

## 2021-03-29 DIAGNOSIS — Z7409 Other reduced mobility: Secondary | ICD-10-CM

## 2021-03-29 DIAGNOSIS — I700 Atherosclerosis of aorta: Secondary | ICD-10-CM

## 2021-03-29 NOTE — Patient Instructions (Signed)
Please review the attached list of medications and notify my office if there are any errors.   Please bring all of your medications to every appointment so we can make sure that our medication list is the same as yours.  Let me know if you want me to set up a referral to a new neurologist

## 2021-03-29 NOTE — Progress Notes (Signed)
Established patient visit   Patient: Alexandria Rodriguez   DOB: Jan 02, 1951   70 y.o. Female  MRN: [id number] Visit Date: 03/29/2021  Today's healthcare provider: Lelon Huh, MD   Chief Complaint  Patient presents with   Tinnitus   Follow-up   Subjective    HPI  Follow up for Tinnitus:  The patient was last seen for this 6 months ago. Changes made at last visit include increasing to - nortriptyline (PAMELOR) 50 MG capsule; Take 1 capsule (50 mg total) by mouth at bedtime.   She reports excellent compliance with treatment. She feels that condition is Unchanged. She is not having side effects.  Has also tried running background noises which seem to make it even worse.  -----------------------------------------------------------------------------------------   Follow up for B12 Deficieny:  The patient was last seen for this 6 months ago. Changes made at last visit include none.  She reports excellent compliance with treatment. She feels that condition is Unchanged. She is not having side effects.   -----------------------------------------------------------------------------------------   Follow up for aortic atherosclerosis:  The patient was last seen for this 1  year  ago. Changes made at last visit include none.  She reports excellent compliance with treatment. She feels that condition is Unchanged. She is not having side effects.     Past Medical History:  Diagnosis Date   Anxiety    Cataract    Cerebrovascular disease    Chronic pain    back pain   Contact dermatitis    COPD (chronic obstructive pulmonary disease) ([place])    Depression    Diverticulosis 10/31/10   On Tranverse colon   GERD (gastroesophageal reflux disease)    Hyperlipidemia    Myasthenia gravis ([place]) 02/2013   Diagnosed by doctor Manuella Ghazi   Neuropathy    Osteoporosis    Parkinson's disease (tremor, stiffness, slow motion, unstable posture) ([place])    Stroke ([place])    Vitamin D deficiency      Patient Active Problem List   Diagnosis Date Noted   Impaired functional mobility, balance, gait, and endurance 03/29/2021   Carotid artery stenosis 06/18/2020   History of CVA in adulthood 06/18/2020   Eustachian tube dysfunction, left 05/11/2020   Lung nodule 03/15/2018   Aortic atherosclerosis ([place]) 03/14/2018   Essential tremor 06/25/2017   AD (atopic dermatitis) 06/01/2015   Back pain, chronic 06/01/2015   Compression fracture 06/01/2015   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   Myasthenia gravis ([place]) 06/01/2015   TMJ (temporomandibular joint disorder) 12/22/2014   Diverticulosis 12/22/2014   Depression 12/22/2014   GERD (gastroesophageal reflux disease) 12/22/2014   Fibrocystic breast disease 12/22/2014   Dyspnea, unspecified 12/22/2014   Tremor 12/22/2014   Radiculopathy of cervical region 12/22/2014   Tobacco use 12/22/2014   Diffuse cystic mastopathy of breast 12/22/2014   Diverticulosis of intestine without perforation or abscess without bleeding 12/22/2014   History of adenomatous polyp of colon 12/04/2014   Osteoporosis 12/04/2014   B12 deficiency 11/04/2014   Polyneuropathy ([place]) 11/03/2014   Abnormal barium swallow 10/02/2014   Dysphagia 10/02/2014   Anxiety 07/31/2013   Low back pain 07/31/2013   Vitamin D deficiency 07/31/2013   COPD (chronic obstructive pulmonary disease) ([place]) 12/27/2012     -----------------------------------------------------------------------------------------   She is also having trouble with her balance and gain and require wheelchair or walker to get around. She was seen by neurologist in [facility] and states she was not given a satisfactory explanation or plan to address this problem.  She had previously been followed by Dr. Manuella Ghazi for several year for tremors states he treated her for Parkinson's, but was then told by his NP hat she did not have Parkinsons so she does not want to return to his practice. Dr. Manuella Ghazi referred her to  Dr. Rexene Alberts at [facility] in 2019 for evaluation for evaluation of tremors and gait disorder and recommended tapering off of Sinemet and consider DBS treatment.   Medications: Outpatient Medications Prior to Visit  Medication Sig   carisoprodol (SOMA) 350 MG tablet TAKE 1 TABLET BY MOUTH TWICE DAILY AS NEEDED   Cholecalciferol (VITAMIN D3) 50 MCG (2000 UT) CAPS Take 2 capsules (4,000 Units total) by mouth daily.   clopidogrel (PLAVIX) 75 MG tablet TAKE 1 TABLET(75 MG) BY MOUTH DAILY   Cyanocobalamin (B-12 COMPLIANCE INJECTION) 1000 MCG/ML KIT Inject as directed every 30 (thirty) days.   ENSTILAR 0.005-0.064 % FOAM Apply  a small amount to skin as directed  qd up to 5 days per week to aa psoriasis on body, avoid face, groin, axilla   fluticasone (FLONASE) 50 MCG/ACT nasal spray Place 2 sprays into both nostrils daily.   gabapentin (NEURONTIN) 100 MG capsule TAKE 2 CAPSULES(200 MG) BY MOUTH THREE TIMES DAILY IN ADDITION TO 600 MG GABAPENTIN TABLETS   gabapentin (NEURONTIN) 600 MG tablet Take 1 tablet (600 mg total) by mouth 2 (two) times daily.   nortriptyline (PAMELOR) 50 MG capsule TAKE 1 CAPSULE(50 MG) BY MOUTH AT BEDTIME   prochlorperazine (COMPAZINE) 10 MG tablet Take 1 tablet (10 mg total) by mouth every 6 (six) hours as needed for nausea or vomiting (or headache).   rosuvastatin (CRESTOR) 20 MG tablet TAKE 1 TABLET(20 MG) BY MOUTH DAILY   triamcinolone ointment (KENALOG) 0.1 % APPLY EXTERNALLY TO THE AFFECTED AREA TWICE DAILY AS NEEDED   No facility-administered medications prior to visit.    Review of Systems  Constitutional:  Negative for appetite change.  HENT:  Positive for tinnitus. Negative for ear pain.   Respiratory:  Negative for shortness of breath.   Cardiovascular:  Negative for chest pain and palpitations.  Musculoskeletal:  Positive for gait problem and myalgias.      Objective    BP 131/81 (BP Location: Right Arm, Patient Position: Sitting, Cuff Size: Normal)    Pulse 87     Temp 98.4 F (36.9 C) (Oral)    Resp 16    Wt 122 lb (55.3 kg)    SpO2 97%    BMI 21.61 kg/m  {Show previous vital signs (optional):23777}  Physical Exam   General: Appearance:    Well developed, well nourished female sitting in wheelchair in no acute distress  Eyes:    PERRL, conjunctiva/corneas clear, EOM's intact       Lungs:     Clear to auscultation bilaterally, respirations unlabored  Heart:    Normal heart rate. Normal rhythm. No murmurs, rubs, or gallops.    MS:   All extremities are intact.    Neurologic:   Awake, alert, oriented x 3.          Assessment & Plan     1. Tinnitus of both ears Persistent over 6 months.  - Ambulatory referral to ENT  2. Impaired functional mobility, balance, gait, and endurance She had unsatisfactory evaluation by neurology at [facility]. Will try Ambulatory referral to Physical Therapy She feels Dr. Manuella Ghazi misdiagnosed her tremor and does not want to return. Consider referral to [facility] where she was last seen in 2019.  3. Tremor   4. Vitamin D deficiency  - VITAMIN D 25 Hydroxy (Vit-D Deficiency, Fractures)  5. B12 deficiency  - Vitamin B12  6. Aortic atherosclerosis (HCC)  - Comprehensive metabolic panel - CBC - Lipid panel  7. Polyneuropathy ([place])  - Ambulatory referral to Physical Therapy Continue gabapentin.        The entirety of the information documented in the History of Present Illness, Review of Systems and Physical Exam were personally obtained by me. Portions of this information were initially documented by the CMA and reviewed by me for thoroughness and accuracy.     Lelon Huh, MD  [facility] [phone] (phone) [phone] (fax)  [place]

## 2021-03-30 LAB — COMPREHENSIVE METABOLIC PANEL
ALT: 18 IU/L (ref 0–32)
AST: 19 IU/L (ref 0–40)
Albumin/Globulin Ratio: 2.1 (ref 1.2–2.2)
Albumin: 4.4 g/dL (ref 3.8–4.8)
Alkaline Phosphatase: 94 IU/L (ref 44–121)
BUN/Creatinine Ratio: 5 — ABNORMAL LOW (ref 12–28)
BUN: 6 mg/dL — ABNORMAL LOW (ref 8–27)
Bilirubin Total: 0.3 mg/dL (ref 0.0–1.2)
CO2: 25 mmol/L (ref 20–29)
Calcium: 8.2 mg/dL — ABNORMAL LOW (ref 8.7–10.3)
Chloride: 103 mmol/L (ref 96–106)
Creatinine, Ser: 1.16 mg/dL — ABNORMAL HIGH (ref 0.57–1.00)
Globulin, Total: 2.1 g/dL (ref 1.5–4.5)
Glucose: 84 mg/dL (ref 70–99)
Potassium: 3.9 mmol/L (ref 3.5–5.2)
Sodium: 140 mmol/L (ref 134–144)
Total Protein: 6.5 g/dL (ref 6.0–8.5)
eGFR: 51 mL/min/{1.73_m2} — ABNORMAL LOW (ref >59)

## 2021-03-30 LAB — LIPID PANEL
Chol/HDL Ratio: 2.2 ratio (ref 0.0–4.4)
Cholesterol, Total: 129 mg/dL (ref 100–199)
HDL: 60 mg/dL (ref >39)
LDL Chol Calc (NIH): 56 mg/dL (ref 0–99)
Triglycerides: 64 mg/dL (ref 0–149)
VLDL Cholesterol Cal: 13 mg/dL (ref 5–40)

## 2021-03-30 LAB — CBC
Hematocrit: 36.4 % (ref 34.0–46.6)
Hemoglobin: 12.1 g/dL (ref 11.1–15.9)
MCH: 32.4 pg (ref 26.6–33.0)
MCHC: 33.2 g/dL (ref 31.5–35.7)
MCV: 98 fL — ABNORMAL HIGH (ref 79–97)
Platelets: 202 10*3/uL (ref 150–450)
RBC: 3.73 x10E6/uL — ABNORMAL LOW (ref 3.77–5.28)
RDW: 12.6 % (ref 11.7–15.4)
WBC: 5.1 10*3/uL (ref 3.4–10.8)

## 2021-03-30 LAB — VITAMIN B12: Vitamin B-12: 782 pg/mL (ref 232–1245)

## 2021-03-30 LAB — VITAMIN D 25 HYDROXY (VIT D DEFICIENCY, FRACTURES): Vit D, 25-Hydroxy: 5.4 ng/mL — ABNORMAL LOW (ref 30.0–100.0)

## 2021-04-05 ENCOUNTER — Telehealth: Payer: Self-pay

## 2021-04-05 DIAGNOSIS — I700 Atherosclerosis of aorta: Secondary | ICD-10-CM

## 2021-04-05 MED ORDER — ROSUVASTATIN CALCIUM 20 MG PO TABS: ORAL_TABLET | ORAL | 2 refills | Status: DC

## 2021-04-05 MED ORDER — VITAMIN D (ERGOCALCIFEROL) 1.25 MG (50000 UNIT) PO CAPS: 50000.0000 [IU] | ORAL_CAPSULE | ORAL | 3 refills | Status: DC

## 2021-04-05 NOTE — Telephone Encounter (Signed)
Patient advised. Scheduled for follow up appointment. Prescription send for the Vitamin D and refilled Rosuvastatin.

## 2021-04-05 NOTE — Telephone Encounter (Signed)
-----   Message from Birdie Sons, MD sent at 04/04/2021 11:02 AM EST ----- Kidney functions have declined, need to drink more water.  Vitamin d level is EXTREMELY low. Which can cause all sorts of neurological problems and tinnitus (ringing in the ears) Need to start taking OTC vitamin D 50,000 units once every 7 days. Please send prescription for #12 with 3 refills. If she is already taking a daily vitamin D supplement she needs to continue taking it too.  Please schedule a follow up in 7-8 weeks.

## 2021-04-16 ENCOUNTER — Other Ambulatory Visit: Payer: Self-pay | Admitting: Family Medicine

## 2021-04-16 NOTE — Telephone Encounter (Signed)
Requested medication (s) are due for refill today: yes  Requested medication (s) are on the active medication list: yes  Last refill:  12/18/20 #60 3 RF  Future visit scheduled: yes  Notes to clinic:  med not delegated to NT to RF   Requested Prescriptions  Pending Prescriptions Disp Refills   carisoprodol (SOMA) 350 MG tablet [Pharmacy Med Name: CARISOPRODOL 350MG  TABLETS] 60 tablet     Sig: TAKE 1 TABLET BY MOUTH TWICE DAILY AS NEEDED     Not Delegated - Analgesics:  Muscle Relaxants Failed - 04/16/2021 12:07 PM      Failed - This refill cannot be delegated      Passed - Valid encounter within last 6 months    Recent Outpatient Visits           2 weeks ago Tinnitus of both ears   [facility] Birdie Sons, MD   6 months ago B12 deficiency   [facility] Birdie Sons, MD   8 months ago B12 deficiency   [facility] Birdie Sons, MD   9 months ago History of CVA in adulthood   [place], Kirstie Peri, MD   10 months ago Transient neurologic deficit   [place], Kirstie Peri, MD       Future Appointments             In 1 month Fisher, Kirstie Peri, MD [facility], [place]

## 2021-04-20 ENCOUNTER — Ambulatory Visit (INDEPENDENT_AMBULATORY_CARE_PROVIDER_SITE_OTHER): Payer: Medicare Other

## 2021-04-20 ENCOUNTER — Other Ambulatory Visit: Payer: Self-pay

## 2021-04-20 DIAGNOSIS — E538 Deficiency of other specified B group vitamins: Secondary | ICD-10-CM | POA: Diagnosis not present

## 2021-04-20 MED ORDER — CYANOCOBALAMIN 1000 MCG/ML IJ SOLN
1000.0000 ug | Freq: Once | INTRAMUSCULAR | Status: AC
  Administered 2021-04-20: 1000 ug via INTRAMUSCULAR

## 2021-04-28 DIAGNOSIS — M6281 Muscle weakness (generalized): Secondary | ICD-10-CM | POA: Diagnosis not present

## 2021-04-28 DIAGNOSIS — G629 Polyneuropathy, unspecified: Secondary | ICD-10-CM | POA: Diagnosis not present

## 2021-04-28 DIAGNOSIS — R2681 Unsteadiness on feet: Secondary | ICD-10-CM | POA: Diagnosis not present

## 2021-04-28 DIAGNOSIS — R262 Difficulty in walking, not elsewhere classified: Secondary | ICD-10-CM | POA: Diagnosis not present

## 2021-05-03 DIAGNOSIS — M6281 Muscle weakness (generalized): Secondary | ICD-10-CM | POA: Diagnosis not present

## 2021-05-03 DIAGNOSIS — G629 Polyneuropathy, unspecified: Secondary | ICD-10-CM | POA: Diagnosis not present

## 2021-05-03 DIAGNOSIS — R262 Difficulty in walking, not elsewhere classified: Secondary | ICD-10-CM | POA: Diagnosis not present

## 2021-05-03 DIAGNOSIS — R2681 Unsteadiness on feet: Secondary | ICD-10-CM | POA: Diagnosis not present

## 2021-05-06 DIAGNOSIS — R2681 Unsteadiness on feet: Secondary | ICD-10-CM | POA: Diagnosis not present

## 2021-05-06 DIAGNOSIS — G629 Polyneuropathy, unspecified: Secondary | ICD-10-CM | POA: Diagnosis not present

## 2021-05-06 DIAGNOSIS — R262 Difficulty in walking, not elsewhere classified: Secondary | ICD-10-CM | POA: Diagnosis not present

## 2021-05-06 DIAGNOSIS — M6281 Muscle weakness (generalized): Secondary | ICD-10-CM | POA: Diagnosis not present

## 2021-05-09 DIAGNOSIS — H9313 Tinnitus, bilateral: Secondary | ICD-10-CM | POA: Diagnosis not present

## 2021-05-09 DIAGNOSIS — H903 Sensorineural hearing loss, bilateral: Secondary | ICD-10-CM | POA: Diagnosis not present

## 2021-05-10 DIAGNOSIS — M6281 Muscle weakness (generalized): Secondary | ICD-10-CM | POA: Diagnosis not present

## 2021-05-10 DIAGNOSIS — G629 Polyneuropathy, unspecified: Secondary | ICD-10-CM | POA: Diagnosis not present

## 2021-05-10 DIAGNOSIS — R262 Difficulty in walking, not elsewhere classified: Secondary | ICD-10-CM | POA: Diagnosis not present

## 2021-05-10 DIAGNOSIS — R2681 Unsteadiness on feet: Secondary | ICD-10-CM | POA: Diagnosis not present

## 2021-05-12 ENCOUNTER — Other Ambulatory Visit: Payer: Self-pay | Admitting: Family Medicine

## 2021-05-12 NOTE — Telephone Encounter (Signed)
Requested Prescriptions  Pending Prescriptions Disp Refills   clopidogrel (PLAVIX) 75 MG tablet [Pharmacy Med Name: CLOPIDOGREL 75MG  TABLETS] 30 tablet 0    Sig: TAKE 1 TABLET(75 MG) BY MOUTH DAILY     Hematology: Antiplatelets - clopidogrel Failed - 05/12/2021  1:34 PM      Failed - Cr in normal range and within 360 days    Creatinine  Date Value Ref Range Status  05/13/2013 0.79 0.60 - 1.30 mg/dL Final   Creatinine, Ser  Date Value Ref Range Status  03/29/2021 1.16 (H) 0.57 - 1.00 mg/dL Final         Passed - HCT in normal range and within 180 days    Hematocrit  Date Value Ref Range Status  03/29/2021 36.4 34.0 - 46.6 % Final         Passed - HGB in normal range and within 180 days    Hemoglobin  Date Value Ref Range Status  03/29/2021 12.1 11.1 - 15.9 g/dL Final         Passed - PLT in normal range and within 180 days    Platelets  Date Value Ref Range Status  03/29/2021 202 150 - 450 x10E3/uL Final         Passed - Valid encounter within last 6 months    Recent Outpatient Visits          1 month ago Tinnitus of both ears   [facility] Birdie Sons, MD   7 months ago B12 deficiency   [facility] Birdie Sons, MD   9 months ago B12 deficiency   [facility] Birdie Sons, MD   10 months ago History of CVA in adulthood   [facility] Birdie Sons, MD   11 months ago Transient neurologic deficit   [place], Kirstie Peri, MD      Future Appointments            In 2 weeks Fisher, Kirstie Peri, MD [facility], [place]

## 2021-05-17 ENCOUNTER — Other Ambulatory Visit: Payer: Self-pay | Admitting: *Deleted

## 2021-05-17 DIAGNOSIS — F17210 Nicotine dependence, cigarettes, uncomplicated: Secondary | ICD-10-CM

## 2021-05-17 DIAGNOSIS — Z87891 Personal history of nicotine dependence: Secondary | ICD-10-CM

## 2021-05-18 ENCOUNTER — Other Ambulatory Visit: Payer: Self-pay

## 2021-05-18 ENCOUNTER — Ambulatory Visit (INDEPENDENT_AMBULATORY_CARE_PROVIDER_SITE_OTHER): Payer: Medicare Other

## 2021-05-18 DIAGNOSIS — E538 Deficiency of other specified B group vitamins: Secondary | ICD-10-CM | POA: Diagnosis not present

## 2021-05-18 IMAGING — CT CT CHEST LUNG CANCER SCREENING LOW DOSE W/O CM
2 of 5 series · 15 of 40 positions shown, 18 images · non-contrast
Comparison: 11/27/2018.

CLINICAL DATA: Current smoker, 43 pack-year history.

EXAM:
CT CHEST WITHOUT CONTRAST LOW-DOSE FOR LUNG CANCER SCREENING
TECHNIQUE: Multidetector CT imaging of the chest was performed following the
standard protocol without IV contrast.

[Series 3: lung 1.00 · axial · 0.53mm/px · z∈[-1158,-895]mm · 12 of 291 slices shown, 15 images]
[im 14/291  mediastinal]
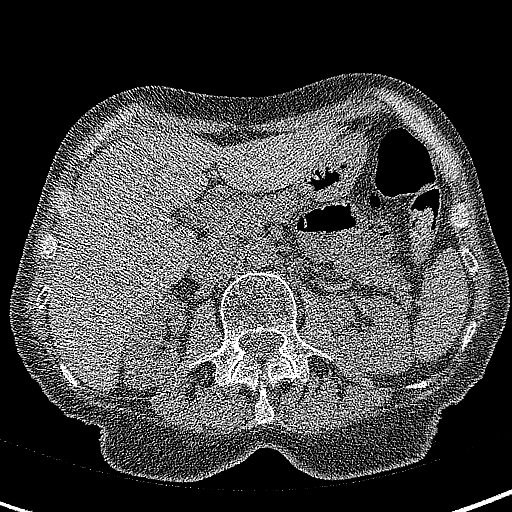
[im 14/291  lung]
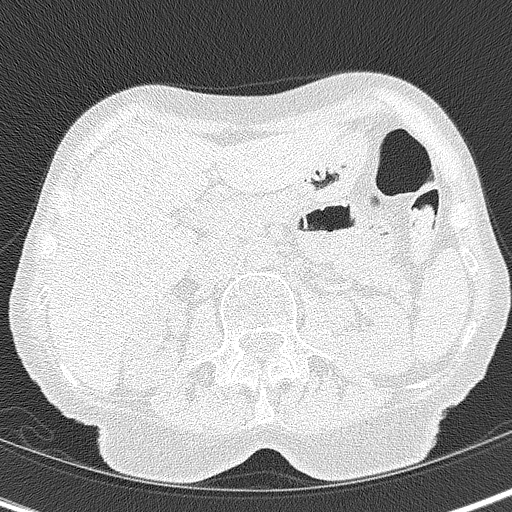
[im 42/291  lung]
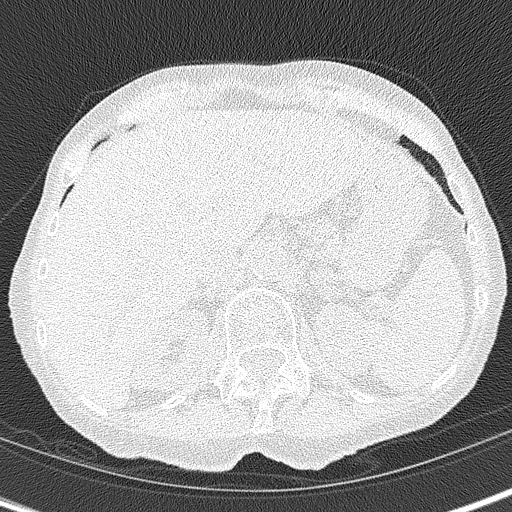
[im 70/291  lung]
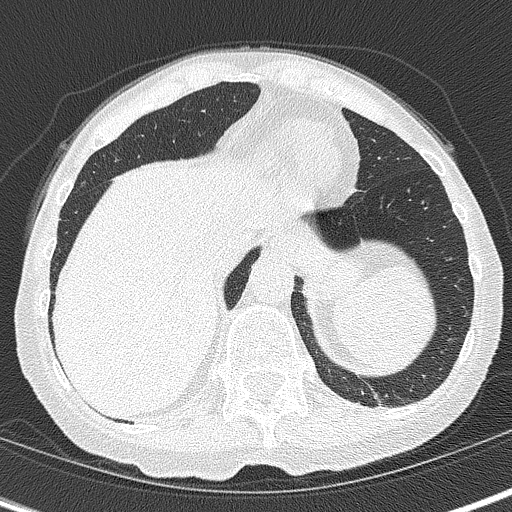
[im 83/291  lung]
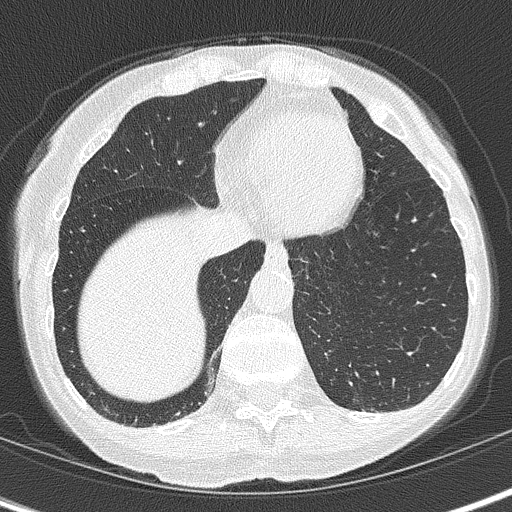
[im 111/291  mediastinal]
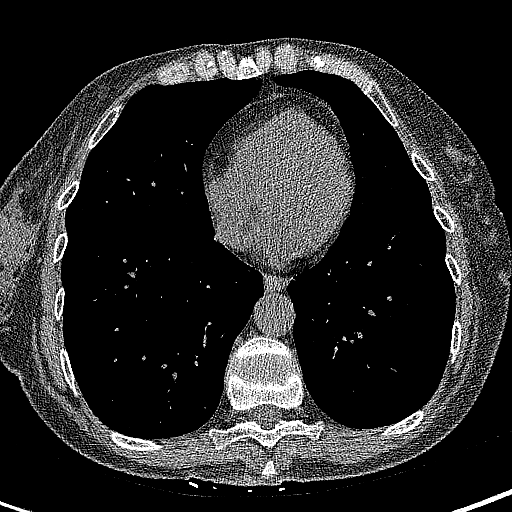
[im 111/291  lung]
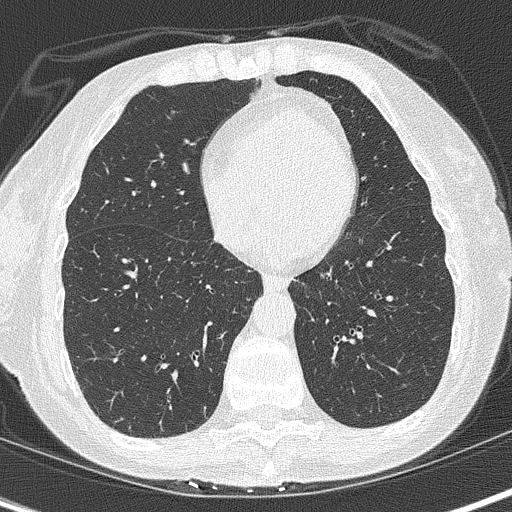
[im 139/291  lung]
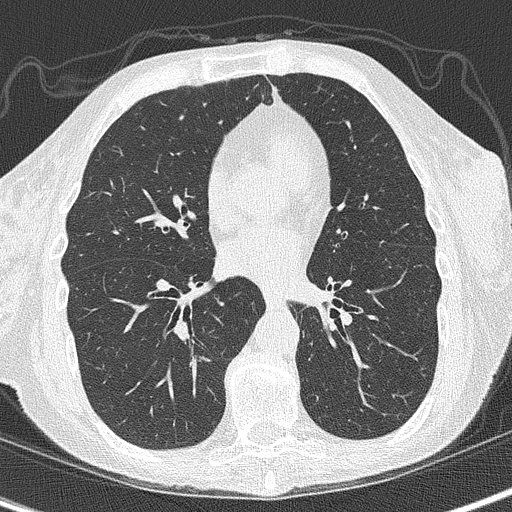
[im 152/291  lung]
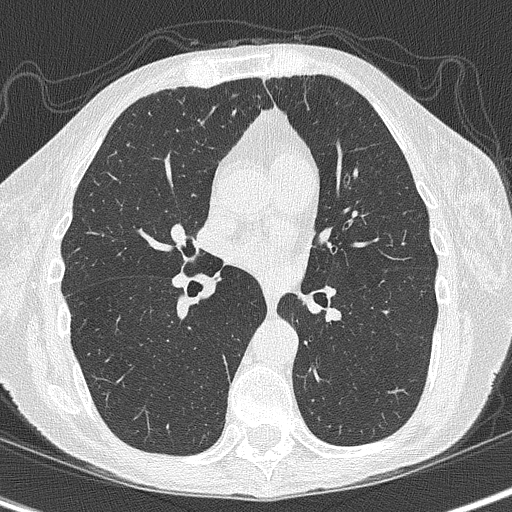
[im 180/291  lung]
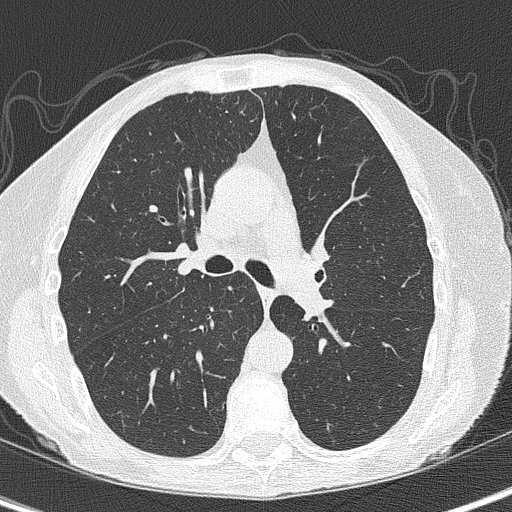
[im 208/291  mediastinal]
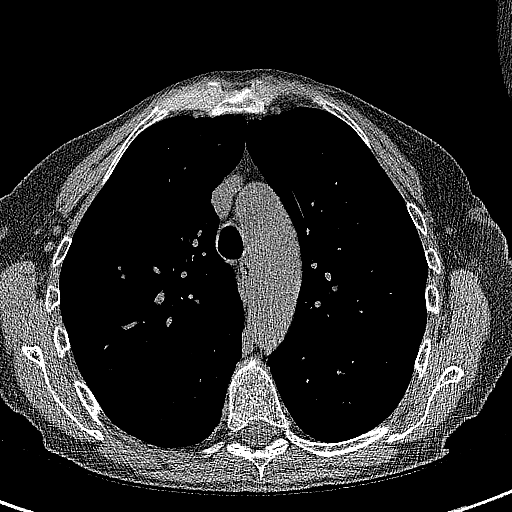
[im 208/291  lung]
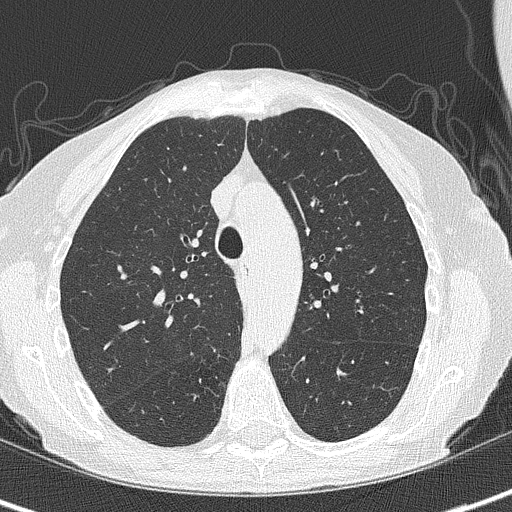
[im 221/291  lung]
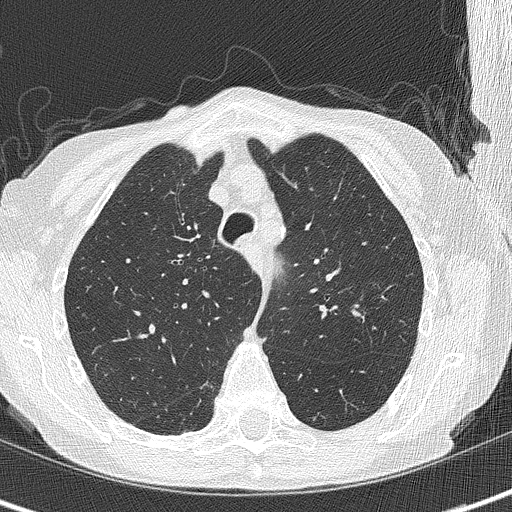
[im 249/291  lung]
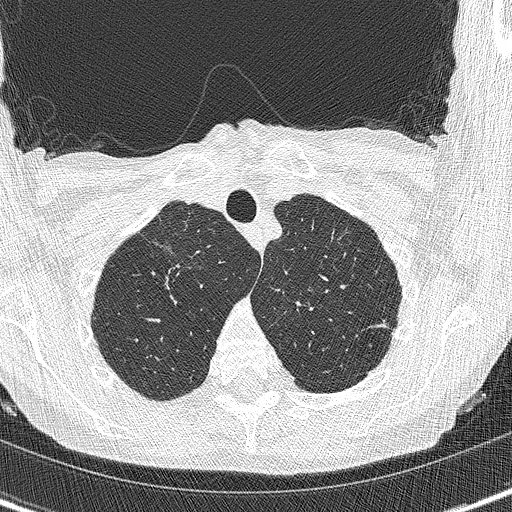
[im 277/291  lung]
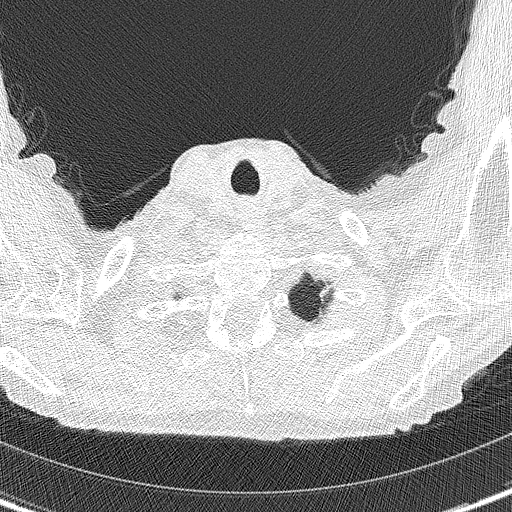

[Series 4: coronals lung 1.00 cor · coronal · 0.53mm/px · 3 of 242 slices shown]
[im 49/242  lung]
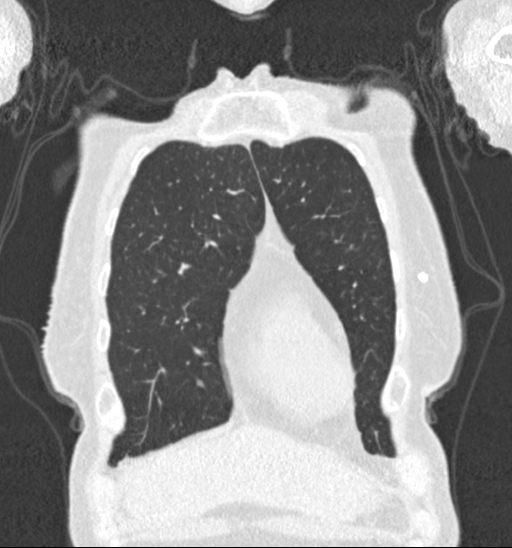
[im 97/242  lung]
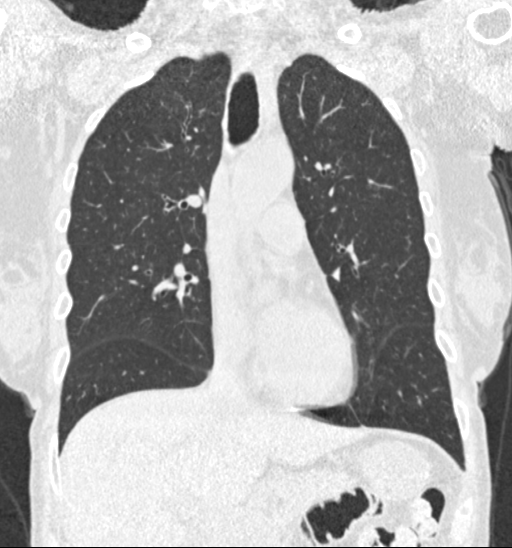
[im 145/242  lung]
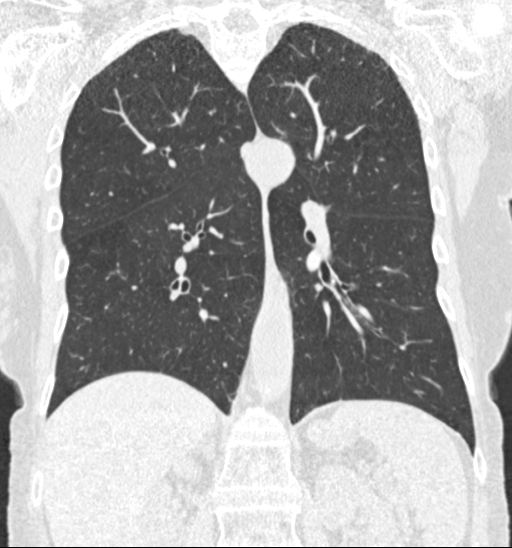

[15 of 40 positions shown; findings below may reference images not displayed]

FINDINGS: Cardiovascular: Atherosclerotic calcification of the aorta and
coronary arteries. Heart size normal. No pericardial effusion.

Mediastinum/Nodes: No pathologically enlarged mediastinal or
axillary lymph nodes. Hilar regions are difficult to evaluate
without IV contrast but appear grossly unremarkable. Esophagus is
grossly unremarkable.

Lungs/Pleura: Biapical pleuroparenchymal scarring. Centrilobular
emphysema. Smoking related respiratory bronchiolitis. Part solid
lesion in the apical segment right upper lobe may be minimally
smaller, now measuring 11.4 cm overall with internal solid component
stable at 5 mm. Other pulmonary nodules measure 6.4 mm or less in
size, as before. No new or worrisome pulmonary nodules. No pleural
fluid. Debris is seen dependently in the trachea.

Upper Abdomen: Visualized portions of the liver, adrenal glands,
kidneys, spleen, pancreas, stomach and bowel are grossly
unremarkable.

Musculoskeletal: Degenerative changes in the spine. No worrisome
lytic or sclerotic lesions.
IMPRESSION: 1. Lung-RADS 2, benign appearance or behavior. Continue annual
screening with low-dose chest CT without contrast in 12 months.
2. Aortic atherosclerosis (I8ZMB-[id number].N). Coronary artery
calcification.
3.  Emphysema ([id number]-[id number].N).

## 2021-05-18 MED ORDER — CYANOCOBALAMIN 1000 MCG/ML IJ SOLN
1000.0000 ug | Freq: Once | INTRAMUSCULAR | Status: AC
  Administered 2021-05-18: 1000 ug via INTRAMUSCULAR

## 2021-05-23 DIAGNOSIS — G629 Polyneuropathy, unspecified: Secondary | ICD-10-CM | POA: Diagnosis not present

## 2021-05-23 DIAGNOSIS — M6281 Muscle weakness (generalized): Secondary | ICD-10-CM | POA: Diagnosis not present

## 2021-05-23 DIAGNOSIS — R262 Difficulty in walking, not elsewhere classified: Secondary | ICD-10-CM | POA: Diagnosis not present

## 2021-05-23 DIAGNOSIS — R2681 Unsteadiness on feet: Secondary | ICD-10-CM | POA: Diagnosis not present

## 2021-05-25 DIAGNOSIS — R262 Difficulty in walking, not elsewhere classified: Secondary | ICD-10-CM | POA: Diagnosis not present

## 2021-05-25 DIAGNOSIS — M6281 Muscle weakness (generalized): Secondary | ICD-10-CM | POA: Diagnosis not present

## 2021-05-25 DIAGNOSIS — R2681 Unsteadiness on feet: Secondary | ICD-10-CM | POA: Diagnosis not present

## 2021-05-25 DIAGNOSIS — G629 Polyneuropathy, unspecified: Secondary | ICD-10-CM | POA: Diagnosis not present

## 2021-05-30 ENCOUNTER — Other Ambulatory Visit: Payer: Self-pay

## 2021-05-30 ENCOUNTER — Other Ambulatory Visit: Payer: Self-pay | Admitting: Family Medicine

## 2021-05-30 ENCOUNTER — Ambulatory Visit (INDEPENDENT_AMBULATORY_CARE_PROVIDER_SITE_OTHER): Payer: Medicare Other | Admitting: Family Medicine

## 2021-05-30 ENCOUNTER — Encounter: Payer: Self-pay | Admitting: Family Medicine

## 2021-05-30 ENCOUNTER — Ambulatory Visit
Admission: RE | Admit: 2021-05-30 | Discharge: 2021-05-30 | Disposition: A | Discharge status: Home / Self Care | Payer: Medicare Other | Source: Ambulatory Visit | Attending: Acute Care | Admitting: Acute Care

## 2021-05-30 VITALS — BP 123/77 | HR 83 | Temp 98.2°F | Resp 14 | Wt 121.0 lb

## 2021-05-30 DIAGNOSIS — N182 Chronic kidney disease, stage 2 (mild): Secondary | ICD-10-CM | POA: Diagnosis not present

## 2021-05-30 DIAGNOSIS — F17210 Nicotine dependence, cigarettes, uncomplicated: Secondary | ICD-10-CM | POA: Diagnosis not present

## 2021-05-30 DIAGNOSIS — L301 Dyshidrosis [pompholyx]: Secondary | ICD-10-CM | POA: Diagnosis not present

## 2021-05-30 DIAGNOSIS — E559 Vitamin D deficiency, unspecified: Secondary | ICD-10-CM

## 2021-05-30 DIAGNOSIS — Z87891 Personal history of nicotine dependence: Secondary | ICD-10-CM | POA: Insufficient documentation

## 2021-05-30 MED ORDER — TRIAMCINOLONE ACETONIDE 0.1 % EX OINT: TOPICAL_OINTMENT | CUTANEOUS | 1 refills | Status: AC

## 2021-05-30 NOTE — Progress Notes (Signed)
I,Aws Shere L Dominika Losey,acting as a scribe for Lelon Huh, MD.,have documented all relevant documentation on the behalf of Lelon Huh, MD,as directed by  Lelon Huh, MD while in the presence of Lelon Huh, MD.   Established patient visit   Patient: Alexandria Rodriguez   DOB: 1950-09-01   70 y.o. Female  MRN: [id number] Visit Date: 05/30/2021  Today's healthcare provider: Lelon Huh, MD   Chief Complaint  Patient presents with   Follow-up   Vitamin D Deficiency   Subjective    HPI  Vitamin D deficiency, follow-up  Lab Results  Component Value Date   VD25OH 5.4 (L) 03/29/2021   VD25OH 4.9 (L) 05/11/2020   VD25OH 4.8 (L) 03/12/2020   CALCIUM 8.2 (L) 03/29/2021   CALCIUM 8.4 (L) 11/16/2020        Wt Readings from Last 3 Encounters:  05/30/21 121 lb (54.9 kg)  05/30/21 120 lb (54.4 kg)  03/29/21 122 lb (55.3 kg)    She was last seen for vitamin D deficiency on 03/29/2021.    Management since that visit includes starting OTC vitamin D 50,000 units once every 7 days. She reports good compliance with treatment. She is not having side effects.   Symptoms: Yes change in energy level Yes numbness or tingling in feet and hands  No bone pain No unexplained fracture   ---------------------------------------------------------------------------------------------------   Follow up for abnormal kidney function:  The patient was last seen for this on 03/29/2021.   Changes made at last visit include advising patient to drink more water due to decline in kidney functions.  She reports fair compliance with treatment. She feels that condition is Unchanged. She is not having side effects.   -----------------------------------------------------------------------------------------   Medications: Outpatient Medications Prior to Visit  Medication Sig   carisoprodol (SOMA) 350 MG tablet TAKE 1 TABLET BY MOUTH TWICE DAILY AS NEEDED   Cholecalciferol (VITAMIN D3) 50 MCG  (2000 UT) CAPS Take 2 capsules (4,000 Units total) by mouth daily.   clopidogrel (PLAVIX) 75 MG tablet TAKE 1 TABLET(75 MG) BY MOUTH DAILY   Cyanocobalamin (B-12 COMPLIANCE INJECTION) 1000 MCG/ML KIT Inject as directed every 30 (thirty) days.   ENSTILAR 0.005-0.064 % FOAM Apply  a small amount to skin as directed  qd up to 5 days per week to aa psoriasis on body, avoid face, groin, axilla   fluticasone (FLONASE) 50 MCG/ACT nasal spray Place 2 sprays into both nostrils daily.   gabapentin (NEURONTIN) 100 MG capsule TAKE 2 CAPSULES(200 MG) BY MOUTH THREE TIMES DAILY IN ADDITION TO 600 MG GABAPENTIN TABLETS   gabapentin (NEURONTIN) 600 MG tablet Take 1 tablet (600 mg total) by mouth 2 (two) times daily.   prochlorperazine (COMPAZINE) 10 MG tablet Take 1 tablet (10 mg total) by mouth every 6 (six) hours as needed for nausea or vomiting (or headache).   rosuvastatin (CRESTOR) 20 MG tablet TAKE 1 TABLET(20 MG) BY MOUTH DAILY   triamcinolone ointment (KENALOG) 0.1 % APPLY EXTERNALLY TO THE AFFECTED AREA TWICE DAILY AS NEEDED   Vitamin D, Ergocalciferol, (DRISDOL) 1.25 MG (50000 UNIT) CAPS capsule Take 1 capsule (50,000 Units total) by mouth every 7 (seven) days.   No facility-administered medications prior to visit.    Review of Systems  Constitutional:  Negative for appetite change, chills, fatigue and fever.  HENT:  Positive for tinnitus.   Respiratory:  Negative for chest tightness and shortness of breath.   Cardiovascular:  Negative for chest pain and palpitations.  Gastrointestinal:  Negative for abdominal pain, nausea and vomiting.  Neurological:  Positive for weakness and numbness. Negative for dizziness.      Objective    BP 123/77 (BP Location: Left Arm, Patient Position: Sitting, Cuff Size: Normal)    Pulse 83    Temp 98.2 F (36.8 C) (Oral)    Resp 14    Wt 121 lb (54.9 kg)    SpO2 100% Comment: room air   BMI 21.43 kg/m  {Show previous vital signs (optional):23777}  Physical Exam   General appearance: Well developed, well nourished female, cooperative and in no acute distress Head: Normocephalic, without obvious abnormality, atraumatic Respiratory: Respirations even and unlabored, normal respiratory rate Extremities: All extremities are intact.  Skin: Skin color, texture, turgor normal. No rashes seen  Psych: Appropriate mood and affect. Neurologic: Mental status: Alert, oriented to person, place, and time, thought content appropriate.     Assessment & Plan     1. Vitamin D deficiency Now on weekly oral vitamin d supplements.  - VITAMIN D 25 Hydroxy (Vit-D Deficiency, Fractures)  2. Stage 2 chronic kidney disease Is working on increasing water consumption.  - Renal function panel  3. Dyshidrotic eczema Refill - triamcinolone ointment (KENALOG) 0.1 %; APPLY EXTERNALLY TO THE AFFECTED AREA TWICE DAILY AS NEEDED  Dispense: 454 g; Refill: 1        The entirety of the information documented in the History of Present Illness, Review of Systems and Physical Exam were personally obtained by me. Portions of this information were initially documented by the CMA and reviewed by me for thoroughness and accuracy.     Lelon Huh, MD  [facility] [phone] (phone) [phone] (fax)  [place]

## 2021-05-31 DIAGNOSIS — G629 Polyneuropathy, unspecified: Secondary | ICD-10-CM | POA: Diagnosis not present

## 2021-05-31 DIAGNOSIS — R262 Difficulty in walking, not elsewhere classified: Secondary | ICD-10-CM | POA: Diagnosis not present

## 2021-05-31 DIAGNOSIS — R2681 Unsteadiness on feet: Secondary | ICD-10-CM | POA: Diagnosis not present

## 2021-05-31 DIAGNOSIS — M6281 Muscle weakness (generalized): Secondary | ICD-10-CM | POA: Diagnosis not present

## 2021-05-31 LAB — VITAMIN D 25 HYDROXY (VIT D DEFICIENCY, FRACTURES): Vit D, 25-Hydroxy: 9.6 ng/mL — ABNORMAL LOW (ref 30.0–100.0)

## 2021-05-31 LAB — RENAL FUNCTION PANEL
Albumin: 4.1 g/dL (ref 3.8–4.8)
BUN/Creatinine Ratio: 7 — ABNORMAL LOW (ref 12–28)
BUN: 7 mg/dL — ABNORMAL LOW (ref 8–27)
CO2: 25 mmol/L (ref 20–29)
Calcium: 8.7 mg/dL (ref 8.7–10.3)
Chloride: 104 mmol/L (ref 96–106)
Creatinine, Ser: 0.94 mg/dL (ref 0.57–1.00)
Glucose: 85 mg/dL (ref 70–99)
Phosphorus: 3.6 mg/dL (ref 3.0–4.3)
Potassium: 3.8 mmol/L (ref 3.5–5.2)
Sodium: 140 mmol/L (ref 134–144)
eGFR: 65 mL/min/{1.73_m2} (ref >59)

## 2021-06-01 ENCOUNTER — Other Ambulatory Visit: Payer: Self-pay | Admitting: Family Medicine

## 2021-06-01 DIAGNOSIS — H9313 Tinnitus, bilateral: Secondary | ICD-10-CM

## 2021-06-01 NOTE — Telephone Encounter (Signed)
Is patient still taking nortriptyline? It was prescribed for tinnitus but I had thought she stopped taking it because it wasn't helping.

## 2021-06-03 ENCOUNTER — Other Ambulatory Visit: Payer: Self-pay

## 2021-06-03 DIAGNOSIS — Z87891 Personal history of nicotine dependence: Secondary | ICD-10-CM

## 2021-06-03 DIAGNOSIS — M6281 Muscle weakness (generalized): Secondary | ICD-10-CM | POA: Diagnosis not present

## 2021-06-03 DIAGNOSIS — F17210 Nicotine dependence, cigarettes, uncomplicated: Secondary | ICD-10-CM

## 2021-06-03 DIAGNOSIS — R262 Difficulty in walking, not elsewhere classified: Secondary | ICD-10-CM | POA: Diagnosis not present

## 2021-06-03 DIAGNOSIS — R2681 Unsteadiness on feet: Secondary | ICD-10-CM | POA: Diagnosis not present

## 2021-06-03 DIAGNOSIS — G629 Polyneuropathy, unspecified: Secondary | ICD-10-CM | POA: Diagnosis not present

## 2021-06-10 DIAGNOSIS — R2681 Unsteadiness on feet: Secondary | ICD-10-CM | POA: Diagnosis not present

## 2021-06-10 DIAGNOSIS — M6281 Muscle weakness (generalized): Secondary | ICD-10-CM | POA: Diagnosis not present

## 2021-06-10 DIAGNOSIS — R262 Difficulty in walking, not elsewhere classified: Secondary | ICD-10-CM | POA: Diagnosis not present

## 2021-06-10 DIAGNOSIS — G629 Polyneuropathy, unspecified: Secondary | ICD-10-CM | POA: Diagnosis not present

## 2021-06-15 ENCOUNTER — Other Ambulatory Visit: Payer: Self-pay

## 2021-06-15 ENCOUNTER — Ambulatory Visit (INDEPENDENT_AMBULATORY_CARE_PROVIDER_SITE_OTHER): Payer: Medicare Other

## 2021-06-15 DIAGNOSIS — E538 Deficiency of other specified B group vitamins: Secondary | ICD-10-CM

## 2021-06-15 MED ORDER — CYANOCOBALAMIN 1000 MCG/ML IJ SOLN
1000.0000 ug | Freq: Once | INTRAMUSCULAR | Status: AC
  Administered 2021-06-15: 1000 ug via INTRAMUSCULAR

## 2021-06-17 DIAGNOSIS — R2681 Unsteadiness on feet: Secondary | ICD-10-CM | POA: Diagnosis not present

## 2021-06-17 DIAGNOSIS — R262 Difficulty in walking, not elsewhere classified: Secondary | ICD-10-CM | POA: Diagnosis not present

## 2021-06-17 DIAGNOSIS — M6281 Muscle weakness (generalized): Secondary | ICD-10-CM | POA: Diagnosis not present

## 2021-06-17 DIAGNOSIS — G629 Polyneuropathy, unspecified: Secondary | ICD-10-CM | POA: Diagnosis not present

## 2021-06-18 ENCOUNTER — Other Ambulatory Visit: Payer: Self-pay | Admitting: Family Medicine

## 2021-06-20 DIAGNOSIS — R2681 Unsteadiness on feet: Secondary | ICD-10-CM | POA: Diagnosis not present

## 2021-06-20 DIAGNOSIS — G629 Polyneuropathy, unspecified: Secondary | ICD-10-CM | POA: Diagnosis not present

## 2021-06-20 DIAGNOSIS — R262 Difficulty in walking, not elsewhere classified: Secondary | ICD-10-CM | POA: Diagnosis not present

## 2021-06-20 DIAGNOSIS — M6281 Muscle weakness (generalized): Secondary | ICD-10-CM | POA: Diagnosis not present

## 2021-06-22 DIAGNOSIS — M6281 Muscle weakness (generalized): Secondary | ICD-10-CM | POA: Diagnosis not present

## 2021-06-22 DIAGNOSIS — G629 Polyneuropathy, unspecified: Secondary | ICD-10-CM | POA: Diagnosis not present

## 2021-06-22 DIAGNOSIS — R2681 Unsteadiness on feet: Secondary | ICD-10-CM | POA: Diagnosis not present

## 2021-06-22 DIAGNOSIS — R262 Difficulty in walking, not elsewhere classified: Secondary | ICD-10-CM | POA: Diagnosis not present

## 2021-07-03 ENCOUNTER — Other Ambulatory Visit: Payer: Self-pay | Admitting: Family Medicine

## 2021-07-03 DIAGNOSIS — I700 Atherosclerosis of aorta: Secondary | ICD-10-CM

## 2021-07-13 ENCOUNTER — Ambulatory Visit (INDEPENDENT_AMBULATORY_CARE_PROVIDER_SITE_OTHER): Payer: Medicare Other

## 2021-07-13 DIAGNOSIS — E538 Deficiency of other specified B group vitamins: Secondary | ICD-10-CM

## 2021-07-13 MED ORDER — CYANOCOBALAMIN 1000 MCG/ML IJ SOLN
1000.0000 ug | Freq: Once | INTRAMUSCULAR | Status: AC
  Administered 2021-07-13: 1000 ug via INTRAMUSCULAR

## 2021-08-10 ENCOUNTER — Ambulatory Visit: Payer: Medicare Other

## 2021-08-15 ENCOUNTER — Other Ambulatory Visit: Payer: Self-pay | Admitting: Family Medicine

## 2021-08-17 ENCOUNTER — Ambulatory Visit (INDEPENDENT_AMBULATORY_CARE_PROVIDER_SITE_OTHER): Payer: Medicare Other

## 2021-08-17 DIAGNOSIS — E538 Deficiency of other specified B group vitamins: Secondary | ICD-10-CM

## 2021-08-17 MED ORDER — CYANOCOBALAMIN 1000 MCG/ML IJ SOLN
1000.0000 ug | Freq: Once | INTRAMUSCULAR | Status: AC
  Administered 2021-08-17: 1000 ug via INTRAMUSCULAR

## 2021-08-30 ENCOUNTER — Ambulatory Visit (INDEPENDENT_AMBULATORY_CARE_PROVIDER_SITE_OTHER): Payer: Medicare Other | Admitting: Family Medicine

## 2021-08-30 ENCOUNTER — Encounter: Payer: Self-pay | Admitting: Family Medicine

## 2021-08-30 VITALS — BP 109/74 | HR 87 | Temp 98.1°F | Resp 16 | Wt 122.2 lb

## 2021-08-30 DIAGNOSIS — E538 Deficiency of other specified B group vitamins: Secondary | ICD-10-CM | POA: Diagnosis not present

## 2021-08-30 DIAGNOSIS — K219 Gastro-esophageal reflux disease without esophagitis: Secondary | ICD-10-CM | POA: Diagnosis not present

## 2021-08-30 DIAGNOSIS — E559 Vitamin D deficiency, unspecified: Secondary | ICD-10-CM | POA: Diagnosis not present

## 2021-08-30 DIAGNOSIS — I700 Atherosclerosis of aorta: Secondary | ICD-10-CM

## 2021-08-30 DIAGNOSIS — J449 Chronic obstructive pulmonary disease, unspecified: Secondary | ICD-10-CM

## 2021-08-30 MED ORDER — DEXLANSOPRAZOLE 60 MG PO CPDR: 60.0000 mg | DELAYED_RELEASE_CAPSULE | Freq: Every day | ORAL | 5 refills | Status: DC

## 2021-08-30 NOTE — Progress Notes (Signed)
I,[name],acting as a scribe for Lelon Huh, MD.,have documented all relevant documentation on the behalf of Lelon Huh, MD,as directed by  Lelon Huh, MD while in the presence of Lelon Huh, MD.   Established patient visit   Patient: Alexandria Rodriguez   DOB: 1951/02/02   71 y.o. Female  MRN: [id number] Visit Date: 08/30/2021  Today's healthcare provider: Lelon Huh, MD   No chief complaint on file.  Subjective    HPI HPI   Vitamin D deficiency   Abnormal kidney function Last edited by Alanson Puls, CMA on 08/30/2021  1:45 PM.      Vitamin D deficiency, follow-up  Lab Results  Component Value Date   VD25OH 9.6 (L) 05/30/2021   VD25OH 5.4 (L) 03/29/2021   VD25OH 4.9 (L) 05/11/2020   CALCIUM 8.7 05/30/2021   CALCIUM 8.2 (L) 03/29/2021        Wt Readings from Last 3 Encounters:  08/30/21 122 lb 3.2 oz (55.4 kg)  05/30/21 120 lb (54.4 kg)  05/30/21 121 lb (54.9 kg)    She was last seen for vitamin D deficiency 3 months ago.  Management since that visit includes: start vitamin D3 5,000 units daily.  She reports fair compliance with treatment. Reports she takes about 3 times weekly due to it giving her indigestion.    Symptoms: No change in energy level No numbness or tingling  No bone pain No unexplained fracture   ---------------------------------------------------------------------------------------------------  Follow up for abnormal kidney function  The patient was last seen for this 3 months ago. Changes made at last visit include advising pt to drink more water. She reports poor compliance with treatment. She feels that condition unchanged.  Lab Results  Component Value Date   NA 140 05/30/2021   K 3.8 05/30/2021   CO2 25 05/30/2021   GLUCOSE 85 05/30/2021   BUN 7 (L) 05/30/2021   CREATININE 0.94 05/30/2021   CALCIUM 8.7 05/30/2021   EGFR 65 05/30/2021     -----------------------------------------------------------------------------------------  She reports that she frequently has heart burn after eating and occasionally feels like food gets stuck in esophagus after eating. She was seen by Dr. Tiffany Kocher fo this in 2016 and had esophageal dilation which helped with problem for a few year, but has been bothering more lately.    Medications: Outpatient Medications Prior to Visit  Medication Sig   carisoprodol (SOMA) 350 MG tablet TAKE 1 TABLET BY MOUTH TWICE DAILY AS NEEDED   Cholecalciferol (VITAMIN D3) 50 MCG (2000 UT) CAPS Take 2 capsules (4,000 Units total) by mouth daily.   clopidogrel (PLAVIX) 75 MG tablet Take 1 tablet (75 mg total) by mouth daily.   Cyanocobalamin (B-12 COMPLIANCE INJECTION) 1000 MCG/ML KIT Inject as directed every 30 (thirty) days.   ENSTILAR 0.005-0.064 % FOAM Apply  a small amount to skin as directed  qd up to 5 days per week to aa psoriasis on body, avoid face, groin, axilla   gabapentin (NEURONTIN) 100 MG capsule TAKE 2 CAPSULES(200 MG) BY MOUTH THREE TIMES DAILY IN ADDITION TO 600 MG GABAPENTIN TABLETS   gabapentin (NEURONTIN) 600 MG tablet TAKE 1 TABLET(600 MG) BY MOUTH TWICE DAILY   nortriptyline (PAMELOR) 50 MG capsule TAKE 1 CAPSULE(50 MG) BY MOUTH AT BEDTIME   rosuvastatin (CRESTOR) 20 MG tablet TAKE 1 TABLET(20 MG) BY MOUTH DAILY   triamcinolone ointment (KENALOG) 0.1 % APPLY EXTERNALLY TO THE AFFECTED AREA TWICE DAILY AS NEEDED   Vitamin D, Ergocalciferol, (DRISDOL) 1.25 MG (  50000 UNIT) CAPS capsule Take 1 capsule (50,000 Units total) by mouth every 7 (seven) days.   fluticasone (FLONASE) 50 MCG/ACT nasal spray Place 2 sprays into both nostrils daily. (Patient not taking: Reported on 08/30/2021)   prochlorperazine (COMPAZINE) 10 MG tablet Take 1 tablet (10 mg total) by mouth every 6 (six) hours as needed for nausea or vomiting (or headache). (Patient not taking: Reported on 08/30/2021)   No facility-administered  medications prior to visit.    Review of Systems     Objective    BP 109/74 (BP Location: Right Arm, Patient Position: Sitting, Cuff Size: Normal)   Pulse 87   Temp 98.1 F (36.7 C) (Oral)   Resp 16   Wt 122 lb 3.2 oz (55.4 kg)   SpO2 100%   BMI 21.65 kg/m    Physical Exam    General: Appearance:    Well developed, well nourished female in no acute distress  Eyes:    PERRL, conjunctiva/corneas clear, EOM's intact       Lungs:     Clear to auscultation bilaterally, respirations unlabored  Heart:    Normal heart rate. Normal rhythm. No murmurs, rubs, or gallops.    MS:   All extremities are intact.    Neurologic:   Awake, alert, oriented x 3. No apparent focal neurological defect.         Assessment & Plan   1. Gastroesophageal reflux disease, unspecified whether esophagitis present - dexlansoprazole (DEXILANT) 60 MG capsule; Take 1 capsule (60 mg total) by mouth daily.  Dispense: 30 capsule; Refill: 5   2. Vitamin D deficiency Only tolerating vitamin d supplement supplement 2-3 x /week due to indigestion, which sounds like GERD. We are starting her back on PPI today.  - VITAMIN D 25 Hydroxy (Vit-D Deficiency, Fractures) - Renal function panel  3. B12 deficiency Continue b12 injections.   4. Chronic obstructive pulmonary disease, unspecified COPD type ([place]) Per LDCT. Fairly asymptomatic given limitations on her activity due to underlying neuruologic disorders.   5. Aortic atherosclerosis ([place]) She is tolerating rosuvastatin well with no adverse effects.  Followed by vascular.        The entirety of the information documented in the History of Present Illness, Review of Systems and Physical Exam were personally obtained by me. Portions of this information were initially documented by the CMA and reviewed by me for thoroughness and accuracy.     Lelon Huh, MD  [facility] [phone] (phone) [phone] (fax)  [place]

## 2021-08-31 LAB — RENAL FUNCTION PANEL
Albumin: 4.4 g/dL (ref 3.8–4.8)
BUN/Creatinine Ratio: 5 — ABNORMAL LOW (ref 12–28)
BUN: 6 mg/dL — ABNORMAL LOW (ref 8–27)
CO2: 25 mmol/L (ref 20–29)
Calcium: 8.7 mg/dL (ref 8.7–10.3)
Chloride: 105 mmol/L (ref 96–106)
Creatinine, Ser: 1.13 mg/dL — ABNORMAL HIGH (ref 0.57–1.00)
Glucose: 90 mg/dL (ref 70–99)
Phosphorus: 3.9 mg/dL (ref 3.0–4.3)
Potassium: 4.5 mmol/L (ref 3.5–5.2)
Sodium: 141 mmol/L (ref 134–144)
eGFR: 52 mL/min/{1.73_m2} — ABNORMAL LOW (ref >59)

## 2021-08-31 LAB — VITAMIN D 25 HYDROXY (VIT D DEFICIENCY, FRACTURES): Vit D, 25-Hydroxy: 6.4 ng/mL — ABNORMAL LOW (ref 30.0–100.0)

## 2021-09-04 ENCOUNTER — Other Ambulatory Visit: Payer: Self-pay | Admitting: Family Medicine

## 2021-09-04 DIAGNOSIS — K219 Gastro-esophageal reflux disease without esophagitis: Secondary | ICD-10-CM

## 2021-09-04 MED ORDER — LANSOPRAZOLE 30 MG PO CPDR: 30.0000 mg | DELAYED_RELEASE_CAPSULE | Freq: Every day | ORAL | 5 refills | Status: DC

## 2021-09-14 ENCOUNTER — Ambulatory Visit (INDEPENDENT_AMBULATORY_CARE_PROVIDER_SITE_OTHER): Payer: Medicare Other

## 2021-09-14 DIAGNOSIS — E538 Deficiency of other specified B group vitamins: Secondary | ICD-10-CM | POA: Diagnosis not present

## 2021-09-14 MED ORDER — CYANOCOBALAMIN 1000 MCG/ML IJ SOLN
1000.0000 ug | Freq: Once | INTRAMUSCULAR | Status: AC
Start: 2021-09-14 — End: 2021-09-14
  Administered 2021-09-14: 1000 ug via INTRAMUSCULAR

## 2021-10-08 ENCOUNTER — Other Ambulatory Visit: Payer: Self-pay | Admitting: Family Medicine

## 2021-10-08 DIAGNOSIS — I700 Atherosclerosis of aorta: Secondary | ICD-10-CM

## 2021-10-12 ENCOUNTER — Ambulatory Visit (INDEPENDENT_AMBULATORY_CARE_PROVIDER_SITE_OTHER): Payer: Medicare Other

## 2021-10-12 DIAGNOSIS — E538 Deficiency of other specified B group vitamins: Secondary | ICD-10-CM

## 2021-10-12 MED ORDER — CYANOCOBALAMIN 1000 MCG/ML IJ SOLN
1000.0000 ug | Freq: Once | INTRAMUSCULAR | Status: AC
  Administered 2021-10-12: 1000 ug via INTRAMUSCULAR

## 2021-10-13 DIAGNOSIS — H2513 Age-related nuclear cataract, bilateral: Secondary | ICD-10-CM | POA: Diagnosis not present

## 2021-10-17 ENCOUNTER — Other Ambulatory Visit: Payer: Self-pay | Admitting: Family Medicine

## 2021-11-01 ENCOUNTER — Other Ambulatory Visit: Payer: Self-pay | Admitting: Family Medicine

## 2021-11-02 ENCOUNTER — Ambulatory Visit: Payer: Self-pay

## 2021-11-02 NOTE — Telephone Encounter (Signed)
   Chief Complaint: cough Symptoms: nonproductive cough, sore throat from coughing so much, SOB with activity  Frequency: 2 weeks Pertinent Negatives: Patient denies fever or congestion Disposition: '[]'$ ED /'[]'$ Urgent Care (no appt availability in office) / '[x]'$ Appointment(In office/virtual)/ '[]'$  [place] [facility]/ '[]'$ Home Care/ '[]'$ Refused Recommended Disposition /'[]'$ [facility]/ '[]'$  Follow-up with PCP Additional Notes: pt states she has just been using cough drops but they dont really help with the cough. Pt didn't want to come in for appt and prefers something just be sent in but was agreeable to do telephone visit for tomorrow at 1540 with Letitia Libra but wants to know if anything can be sent in today for cough. Cough gets worse when she has to talk frequently.   Summary: cough   The patient has experienced a cough for roughly two weeks   The patient doesn't have any congestion, nausea or stomach discomfort   The patient shares that the cough worsens if they speak too much or too fast   Please contact the patient further when possible      Reason for Disposition  [1] Continuous (nonstop) coughing interferes with work or school AND [2] no improvement using cough treatment per Care Advice  Answer Assessment - Initial Assessment Questions 1. ONSET: "When did the cough begin?"      2 weeks 3. SPUTUM: "Describe the color of your sputum" (none, dry cough; clear, white, yellow, green)     Nothing will come up 5. DIFFICULTY BREATHING: "Are you having difficulty breathing?" If Yes, ask: "How bad is it?" (e.g., mild, moderate, severe)    - MILD: No SOB at rest, mild SOB with walking, speaks normally in sentences, can lie down, no retractions, pulse < 100.    - MODERATE: SOB at rest, SOB with minimal exertion and prefers to sit, cannot lie down flat, speaks in phrases, mild retractions, audible wheezing, pulse 100-120.    - SEVERE: Very SOB at rest, speaks in single words, struggling to  breathe, sitting hunched forward, retractions, pulse > 120      SOB with activity  10. OTHER SYMPTOMS: "Do you have any other symptoms?" (e.g., runny nose, wheezing, chest pain)       Sore throat from cough  Protocols used: Cough - Acute Productive-A-AH

## 2021-11-03 ENCOUNTER — Ambulatory Visit (INDEPENDENT_AMBULATORY_CARE_PROVIDER_SITE_OTHER): Payer: Medicare Other | Admitting: Physician Assistant

## 2021-11-03 ENCOUNTER — Encounter: Payer: Self-pay | Admitting: Physician Assistant

## 2021-11-03 VITALS — BP 109/78

## 2021-11-03 DIAGNOSIS — R051 Acute cough: Secondary | ICD-10-CM | POA: Diagnosis not present

## 2021-11-03 NOTE — Progress Notes (Signed)
Virtual telephone visit    Virtual Visit via Telephone Note   This visit type was conducted due to national recommendations for restrictions regarding the COVID-19 Pandemic (e.g. social distancing) in an effort to limit this patient's exposure and mitigate transmission in our community. Due to her co-morbid illnesses, this patient is at least at moderate risk for complications without adequate follow up. This format is felt to be most appropriate for this patient at this time. The patient did not have access to video technology or had technical difficulties with video requiring transitioning to audio format only (telephone). Physical exam was limited to content and character of the telephone converstion.    Patient location: home  Provider location: [facility]   I discussed the limitations of evaluation and management by telemedicine and the availability of in person appointments. The patient expressed understanding and agreed to proceed.   Visit Date: 11/03/2021  Today's healthcare provider: Debera Lat, PA-C   No chief complaint on file.  Subjective      Patient reports dry cough going on second week.  States it feels like its coming from chest. Non productive.  Denies congestion, wheezing, SOB, fever.  Reports cough drops help to suppress. States that she has been having cough mostly during the day but sometimes at night Current day smoker up to 12-10 cig a day  Medications: Outpatient Medications Prior to Visit  Medication Sig   carisoprodol (SOMA) 350 MG tablet TAKE 1 TABLET BY MOUTH TWICE DAILY AS NEEDED   Cholecalciferol (VITAMIN D3) 50 MCG (2000 UT) CAPS Take 2 capsules (4,000 Units total) by mouth daily.   clopidogrel (PLAVIX) 75 MG tablet Take 1 tablet (75 mg total) by mouth daily.   Cyanocobalamin (B-12 COMPLIANCE INJECTION) 1000 MCG/ML KIT Inject as directed every 30 (thirty) days.   ENSTILAR 0.005-0.064 % FOAM Apply  a small amount to skin as  directed  qd up to 5 days per week to aa psoriasis on body, avoid face, groin, axilla   fluticasone (FLONASE) 50 MCG/ACT nasal spray Place 2 sprays into both nostrils daily. (Patient not taking: Reported on 08/30/2021)   gabapentin (NEURONTIN) 100 MG capsule TAKE 2 CAPSULES(200 MG) BY MOUTH THREE TIMES DAILY IN ADDITION TO 600 MG GABAPENTIN TABLETS   gabapentin (NEURONTIN) 600 MG tablet TAKE 1 TABLET(600 MG) BY MOUTH TWICE DAILY   lansoprazole (PREVACID) 30 MG capsule Take 1 capsule (30 mg total) by mouth daily.   nortriptyline (PAMELOR) 50 MG capsule TAKE 1 CAPSULE(50 MG) BY MOUTH AT BEDTIME   prochlorperazine (COMPAZINE) 10 MG tablet Take 1 tablet (10 mg total) by mouth every 6 (six) hours as needed for nausea or vomiting (or headache). (Patient not taking: Reported on 08/30/2021)   rosuvastatin (CRESTOR) 20 MG tablet TAKE 1 TABLET(20 MG) BY MOUTH DAILY   triamcinolone ointment (KENALOG) 0.1 % APPLY EXTERNALLY TO THE AFFECTED AREA TWICE DAILY AS NEEDED   Vitamin D, Ergocalciferol, (DRISDOL) 1.25 MG (50000 UNIT) CAPS capsule Take 1 capsule (50,000 Units total) by mouth every 7 (seven) days.   No facility-administered medications prior to visit.    Review of Systems  Constitutional:  Positive for activity change. Negative for chills and fever.  HENT:  Positive for sore throat. Negative for postnasal drip and rhinorrhea.   Respiratory:  Positive for cough. Negative for chest tightness, shortness of breath and wheezing.   Cardiovascular: Negative.  Negative for chest pain and palpitations.       Objective    There  were no vitals taken for this visit.   Physical Exam Vitals reviewed.  Neurological:     Mental Status: She is alert and oriented to person, place, and time.  Psychiatric:        Thought Content: Thought content normal.        Judgment: Judgment normal.       Assessment & Plan     1. Acute cough Could be acute bronchitis? COPD Has hx of COPD, not on inhalers Current  day smoker Symptomatic treatment recommended: mucinex, hot tea with honey, proper hydration, tenting, OTC pain medications/tylenol not nsaids as pt has GERD Advised smoking cessation Continue dexilant for GERD  Scheduled pt with me next week . Might start her on inhalers Will contact her tomorrow to check if she has been doing better   I discussed the assessment and treatment plan with the patient. The patient was provided an opportunity to ask questions and all were answered. The patient agreed with the plan and demonstrated an understanding of the instructions.   The patient was advised to call back or seek an in-person evaluation if the symptoms worsen or if the condition fails to improve as anticipated.  I provided 20 minutes of non-face-to-face time during this encounter.  Portions of this note were created using dictation software and may contain typographical errors.    Mardene Speak, PA-C [facility] [phone] (phone) [phone] (fax)  [place]

## 2021-11-09 ENCOUNTER — Encounter: Payer: Self-pay | Admitting: Family Medicine

## 2021-11-09 ENCOUNTER — Ambulatory Visit (INDEPENDENT_AMBULATORY_CARE_PROVIDER_SITE_OTHER): Payer: Medicare Other | Admitting: Family Medicine

## 2021-11-09 ENCOUNTER — Ambulatory Visit: Payer: Medicare Other

## 2021-11-09 VITALS — BP 106/71 | HR 87 | Temp 98.1°F | Resp 14

## 2021-11-09 DIAGNOSIS — E538 Deficiency of other specified B group vitamins: Secondary | ICD-10-CM | POA: Diagnosis not present

## 2021-11-09 DIAGNOSIS — R051 Acute cough: Secondary | ICD-10-CM

## 2021-11-09 DIAGNOSIS — J449 Chronic obstructive pulmonary disease, unspecified: Secondary | ICD-10-CM

## 2021-11-09 MED ORDER — CYANOCOBALAMIN 1000 MCG/ML IJ SOLN
1000.0000 ug | Freq: Once | INTRAMUSCULAR | Status: AC
  Administered 2021-11-09: 1000 ug via INTRAMUSCULAR

## 2021-11-09 MED ORDER — FLUTICASONE-SALMETEROL 250-50 MCG/ACT IN AEPB: 1.0000 | INHALATION_SPRAY | Freq: Two times a day (BID) | RESPIRATORY_TRACT | 2 refills | Status: DC

## 2021-11-09 NOTE — Addendum Note (Signed)
Addended by: Randal Buba on: 11/09/2021 11:26 AM   Modules accepted: Orders

## 2021-11-09 NOTE — Progress Notes (Signed)
I,Roshena L Chambers,acting as a scribe for Lelon Huh, MD.,have documented all relevant documentation on the behalf of Lelon Huh, MD,as directed by  Lelon Huh, MD while in the presence of Lelon Huh, MD.    Established patient visit   Patient: Alexandria Rodriguez   DOB: 1951/03/24   71 y.o. Female  MRN: [id number] Visit Date: 11/09/2021  Today's healthcare provider: Lelon Huh, MD   Chief Complaint  Patient presents with   Cough   Subjective    Cough This is a new problem. Episode onset: 1-2 weeks ago. The problem has been gradually improving. Pertinent negatives include no chest pain, chills, fever or shortness of breath. Treatments tried: mucinex. The treatment provided mild relief.    Patient was seen for this problems via telephone visit on 11/03/2021 by Mardene Speak, PA-C. At that visit, symptomatic treatment was recommended: mucinex, hot tea with honey, proper hydration, tenting, OTC pain medications/tylenol not nsaids as pt has GERD. Advised smoking cessation and continue lansoprazole for GERD. She states it feels like it's coming from chest. No throat pain or heartburn. She does have history of GERD and strictures requiring esophageal dilation several years ago.   Medications: Outpatient Medications Prior to Visit  Medication Sig   carisoprodol (SOMA) 350 MG tablet TAKE 1 TABLET BY MOUTH TWICE DAILY AS NEEDED   Cholecalciferol (VITAMIN D3) 50 MCG (2000 UT) CAPS Take 2 capsules (4,000 Units total) by mouth daily.   clopidogrel (PLAVIX) 75 MG tablet Take 1 tablet (75 mg total) by mouth daily.   Cyanocobalamin (B-12 COMPLIANCE INJECTION) 1000 MCG/ML KIT Inject as directed every 30 (thirty) days.   ENSTILAR 0.005-0.064 % FOAM Apply  a small amount to skin as directed  qd up to 5 days per week to aa psoriasis on body, avoid face, groin, axilla   fluticasone (FLONASE) 50 MCG/ACT nasal spray Place 2 sprays into both nostrils daily.   gabapentin (NEURONTIN) 100 MG  capsule TAKE 2 CAPSULES(200 MG) BY MOUTH THREE TIMES DAILY IN ADDITION TO 600 MG GABAPENTIN TABLETS   gabapentin (NEURONTIN) 600 MG tablet TAKE 1 TABLET(600 MG) BY MOUTH TWICE DAILY   lansoprazole (PREVACID) 30 MG capsule Take 1 capsule (30 mg total) by mouth daily.   nortriptyline (PAMELOR) 50 MG capsule TAKE 1 CAPSULE(50 MG) BY MOUTH AT BEDTIME   prochlorperazine (COMPAZINE) 10 MG tablet Take 1 tablet (10 mg total) by mouth every 6 (six) hours as needed for nausea or vomiting (or headache).   rosuvastatin (CRESTOR) 20 MG tablet TAKE 1 TABLET(20 MG) BY MOUTH DAILY   triamcinolone ointment (KENALOG) 0.1 % APPLY EXTERNALLY TO THE AFFECTED AREA TWICE DAILY AS NEEDED   Vitamin D, Ergocalciferol, (DRISDOL) 1.25 MG (50000 UNIT) CAPS capsule Take 1 capsule (50,000 Units total) by mouth every 7 (seven) days.   No facility-administered medications prior to visit.    Review of Systems  Constitutional:  Negative for appetite change, chills, fatigue and fever.  Respiratory:  Positive for cough. Negative for chest tightness and shortness of breath.   Cardiovascular:  Negative for chest pain and palpitations.  Gastrointestinal:  Negative for abdominal pain, nausea and vomiting.  Neurological:  Negative for dizziness and weakness.       Objective    BP 106/71 (BP Location: Right Arm, Patient Position: Sitting, Cuff Size: Normal)   Pulse 87   Temp 98.1 F (36.7 C) (Oral)   Resp 14   SpO2 97% Comment: room air   Physical Exam  Diffuse expiratory  wheezes worse in upper lobes.  Normal ENT exam.     Assessment & Plan     1. Acute cough Differential diagnosis includes GERD, allergies, and COPD. She is taking lansoprazole consistently with no other reflux symptoms. She is not currently on any inhalers. Will try Advair as below. Consider increasing PPI, consider trial of montelukast.   2. Chronic obstructive pulmonary disease, unspecified COPD type ([place]) - fluticasone-salmeterol (ADVAIR) 250-50  MCG/ACT AEPB; Inhale 1 puff into the lungs in the morning and at bedtime.  Dispense: 1 each; Refill: 2     The entirety of the information documented in the History of Present Illness, Review of Systems and Physical Exam were personally obtained by me. Portions of this information were initially documented by the CMA and reviewed by me for thoroughness and accuracy.     Lelon Huh, MD  [facility] [phone] (phone) [phone] (fax)  [place]

## 2021-12-02 ENCOUNTER — Ambulatory Visit: Payer: Medicare Other | Admitting: Family Medicine

## 2021-12-02 ENCOUNTER — Ambulatory Visit: Payer: Self-pay

## 2021-12-02 NOTE — Progress Notes (Deleted)
      Established patient visit   Patient: Alexandria Rodriguez   DOB: 28-Dec-1950   71 y.o. Female  MRN: [id number] Visit Date: 12/02/2021  Today's healthcare provider: Lelon Huh, MD   No chief complaint on file.  Subjective    HPI  Follow up for Vitamin D deficiency  The patient was last seen for this 5 months ago. Changes made at last visit include take the 5,000 units of D3 every single day and skip the 50,000 weekly prescription dose..  She reports {excellent/good/fair/poor:19665} compliance with treatment. She feels that condition is {improved/worse/unchanged:3041574}. She {is/is not:21021397} having side effects. ***  -----------------------------------------------------------------------------------------  GERD, Follow up:  The patient was last seen for GERD 3 months ago. Changes made since that visit include start lansoprazole (PREVACID) 30 MG   She reports {excellent/good/fair/poor:19665} compliance with treatment. She {is/is not:21021397} having side effects. ***.  She IS experiencing {Sx; ge reflux:19417}. She is NOT experiencing {Sx; ge reflux:19417:o}  -----------------------------------------------------------------------------------------  Medications: Outpatient Medications Prior to Visit  Medication Sig   carisoprodol (SOMA) 350 MG tablet TAKE 1 TABLET BY MOUTH TWICE DAILY AS NEEDED   Cholecalciferol (VITAMIN D3) 50 MCG (2000 UT) CAPS Take 2 capsules (4,000 Units total) by mouth daily.   clopidogrel (PLAVIX) 75 MG tablet Take 1 tablet (75 mg total) by mouth daily.   Cyanocobalamin (B-12 COMPLIANCE INJECTION) 1000 MCG/ML KIT Inject as directed every 30 (thirty) days.   ENSTILAR 0.005-0.064 % FOAM Apply  a small amount to skin as directed  qd up to 5 days per week to aa psoriasis on body, avoid face, groin, axilla   fluticasone (FLONASE) 50 MCG/ACT nasal spray Place 2 sprays into both nostrils daily.   fluticasone-salmeterol (ADVAIR) 250-50 MCG/ACT AEPB Inhale  1 puff into the lungs in the morning and at bedtime.   gabapentin (NEURONTIN) 100 MG capsule TAKE 2 CAPSULES(200 MG) BY MOUTH THREE TIMES DAILY IN ADDITION TO 600 MG GABAPENTIN TABLETS   gabapentin (NEURONTIN) 600 MG tablet TAKE 1 TABLET(600 MG) BY MOUTH TWICE DAILY   lansoprazole (PREVACID) 30 MG capsule Take 1 capsule (30 mg total) by mouth daily.   nortriptyline (PAMELOR) 50 MG capsule TAKE 1 CAPSULE(50 MG) BY MOUTH AT BEDTIME   prochlorperazine (COMPAZINE) 10 MG tablet Take 1 tablet (10 mg total) by mouth every 6 (six) hours as needed for nausea or vomiting (or headache).   rosuvastatin (CRESTOR) 20 MG tablet TAKE 1 TABLET(20 MG) BY MOUTH DAILY   triamcinolone ointment (KENALOG) 0.1 % APPLY EXTERNALLY TO THE AFFECTED AREA TWICE DAILY AS NEEDED   Vitamin D, Ergocalciferol, (DRISDOL) 1.25 MG (50000 UNIT) CAPS capsule Take 1 capsule (50,000 Units total) by mouth every 7 (seven) days.   No facility-administered medications prior to visit.    Review of Systems  {Labs  Heme  Chem  Endocrine  Serology  Results Review (optional):23779}   Objective    There were no vitals taken for this visit. {Show previous vital signs (optional):23777}  Physical Exam  ***  No results found for any visits on 12/02/21.  Assessment & Plan     ***  No follow-ups on file.      {provider attestation***:1}   Lelon Huh, MD  [facility] [phone] (phone) [phone] (fax)  [place]

## 2021-12-02 NOTE — Telephone Encounter (Signed)
Chief Complaint: Fall/weakness Symptoms: ibid Frequency: This morning Pertinent Negatives: Patient denies injury from fall Disposition: '[x]'$ ED /'[]'$ Urgent Care (no appt availability in office) / '[]'$ Appointment(In office/virtual)/ '[]'$  [place] [facility]/ '[]'$ Home Care/ '[]'$ Refused Recommended Disposition /'[]'$ [place] [facility]/ '[]'$  Follow-up with PCP Additional Notes: Pt fell this morning  - she slid off the bed. Pt states she fell because she is weak. Pt in unable to come to appt this afternoon because of weakness. Pt and husband refuse EMS. PT states that she is going to stay in the house until she feels better.  Husband states that she gets weak when she falls and needs to recover. Pt states that she was weak before the fall. PT states that she falls often.  Per pt and husband, pt has no injury from fall this morning.  Called back to again recommend EMS with was declined.    Reason for Disposition  [1] MODERATE weakness (i.e., interferes with work, school, normal activities) AND [2] new-onset or worsening  [1] SEVERE weakness (i.e., unable to walk or barely able to walk, requires support) AND [2] new-onset or worsening  Answer Assessment - Initial Assessment Questions 1. MECHANISM: "How did the fall happen?"     [place] [address] getting out of bed - Very weakness - slipped off the bed 2. DOMESTIC VIOLENCE AND ELDER ABUSE SCREENING: "Did you fall because someone pushed you or tried to hurt you?" If Yes, ask: "Are you safe now?"     no 3. ONSET: "When did the fall happen?" (e.g., minutes, hours, or days ago)     This morning 4. LOCATION: "What part of the body hit the ground?" (e.g., back, buttocks, head, hips, knees, hands, head, stomach)     Landed on her behind 5. INJURY: "Did you hurt (injure) yourself when you fell?" If Yes, ask: "What did you injure? Tell me more about this?" (e.g., body area; type of injury; pain severity)"     no 6. PAIN: "Is there any pain?" If Yes, ask: "How bad is the  pain?" (e.g., Scale 1-10; or mild,  moderate, severe)   - NONE (0): No pain   - MILD (1-3): Doesn't interfere with normal activities    - MODERATE (4-7): Interferes with normal activities or awakens from sleep    - SEVERE (8-10): Excruciating pain, unable to do any normal activities      0 7. SIZE: For cuts, bruises, or swelling, ask: "How large is it?" (e.g., inches or centimeters)      none 8. PREGNANCY: "Is there any chance you are pregnant?" "When was your last menstrual period?"     na 9. OTHER SYMPTOMS: "Do you have any other symptoms?" (e.g., dizziness, fever, weakness; new onset or worsening).      weakness 10. CAUSE: "What do you think caused the fall (or falling)?" (e.g., tripped, dizzy spell)       Weakness  Answer Assessment - Initial Assessment Questions 1. DESCRIPTION: "Describe how you are feeling."     Very weak 2. SEVERITY: "How bad is it?"  "Can you stand and walk?"   - MILD (0-3): Feels weak or tired, but does not interfere with work, school or normal activities.   - MODERATE (4-7): Able to stand and walk; weakness interferes with work, school, or normal activities.   - SEVERE (8-10): Unable to stand or walk; unable to do usual activities.     severe 3. ONSET: "When did these symptoms begin?" (e.g., hours, days, weeks, months)  Today - but has had them in the past 4. CAUSE: "What do you think is causing the weakness or fatigue?" (e.g., not drinking enough fluids, medical problem, trouble sleeping)     Fall 5. NEW MEDICINES:  "Have you started on any new medicines recently?" (e.g., opioid pain medicines, benzodiazepines, muscle relaxants, antidepressants, antihistamines, neuroleptics, beta blockers)      6. OTHER SYMPTOMS: "Do you have any other symptoms?" (e.g., chest pain, fever, cough, SOB, vomiting, diarrhea, bleeding, other areas of pain)      7. PREGNANCY: "Is there any chance you are pregnant?" "When was your last menstrual period?"  Protocols used: Falls  and Falling-A-AH, Weakness (Generalized) and Fatigue-A-AH

## 2022-01-06 ENCOUNTER — Ambulatory Visit (INDEPENDENT_AMBULATORY_CARE_PROVIDER_SITE_OTHER): Payer: Medicare Other | Admitting: Family Medicine

## 2022-01-06 DIAGNOSIS — E538 Deficiency of other specified B group vitamins: Secondary | ICD-10-CM

## 2022-01-06 MED ORDER — CYANOCOBALAMIN 1000 MCG/ML IJ SOLN
1000.0000 ug | Freq: Once | INTRAMUSCULAR | Status: AC
  Administered 2022-01-06: 1000 ug via INTRAMUSCULAR

## 2022-01-06 NOTE — Progress Notes (Signed)
Injection only, no e &m visit

## 2022-01-24 ENCOUNTER — Telehealth: Payer: Self-pay | Admitting: Family Medicine

## 2022-01-24 NOTE — Telephone Encounter (Signed)
Copied from [place] [id number]. Topic: Medicare AWV >> Jan 24, 2022 11:51 AM Jae Dire wrote: Reason for CRM:  No answer unable to leave a message for patient to call back and schedule Medicare Annual Wellness Visit (AWV) in office.   If unable to come into the office for AWV,  please offer to do virtually or by telephone.  Last AWV: 12/11/2019  Please schedule at anytime with [facility] Advisor.  30 minute appointment for Virtual or phone 45 minute appointment for in office or Initial virtual/phone  Any questions, please contact me at [phone]

## 2022-01-31 ENCOUNTER — Telehealth: Payer: Self-pay

## 2022-01-31 NOTE — Telephone Encounter (Signed)
Patient advised to just ask for it.

## 2022-01-31 NOTE — Telephone Encounter (Signed)
Copied from [place] [id number]. Topic: Appointment Scheduling - Scheduling Inquiry for Clinic >> Jan 31, 2022  1:38 PM Alexandria Rodriguez wrote: Pt has an upcoming appt on 10-25  Pt inquiring if she can received her b12 injection during visit  Last injection was 9-29  Please fu w/ pt

## 2022-01-31 NOTE — Telephone Encounter (Signed)
That's fine

## 2022-02-01 ENCOUNTER — Ambulatory Visit (INDEPENDENT_AMBULATORY_CARE_PROVIDER_SITE_OTHER): Payer: Medicare Other | Admitting: Family Medicine

## 2022-02-01 ENCOUNTER — Encounter: Payer: Self-pay | Admitting: Family Medicine

## 2022-02-01 VITALS — BP 127/61 | HR 90 | Temp 98.0°F | Resp 16 | Wt 126.0 lb

## 2022-02-01 DIAGNOSIS — E538 Deficiency of other specified B group vitamins: Secondary | ICD-10-CM

## 2022-02-01 DIAGNOSIS — R350 Frequency of micturition: Secondary | ICD-10-CM

## 2022-02-01 DIAGNOSIS — R319 Hematuria, unspecified: Secondary | ICD-10-CM | POA: Diagnosis not present

## 2022-02-01 DIAGNOSIS — N390 Urinary tract infection, site not specified: Secondary | ICD-10-CM

## 2022-02-01 LAB — POCT URINALYSIS DIPSTICK
Glucose, UA: NEGATIVE
Ketones, UA: NEGATIVE
Nitrite, UA: POSITIVE
Protein, UA: POSITIVE — AB
Spec Grav, UA: 1.01 (ref 1.010–1.025)
Urobilinogen, UA: 0.2 E.U./dL
pH, UA: 7 (ref 5.0–8.0)

## 2022-02-01 MED ORDER — CYANOCOBALAMIN 1000 MCG/ML IJ SOLN
1000.0000 ug | Freq: Once | INTRAMUSCULAR | Status: AC
  Administered 2022-02-01: 1000 ug via INTRAMUSCULAR

## 2022-02-01 MED ORDER — CEPHALEXIN 500 MG PO CAPS: 500.0000 mg | ORAL_CAPSULE | Freq: Three times a day (TID) | ORAL | 0 refills | Status: AC

## 2022-02-01 NOTE — Progress Notes (Signed)
I,Roshena L Chambers,acting as a scribe for Lelon Huh, MD.,have documented all relevant documentation on the behalf of Lelon Huh, MD,as directed by  Lelon Huh, MD while in the presence of Lelon Huh, MD.    Established patient visit   Patient: Alexandria Rodriguez   DOB: September 22, 1950   71 y.o. Female  MRN: [id number] Visit Date: 02/01/2022  Today's healthcare provider: Lelon Huh, MD   Chief Complaint  Patient presents with   Urinary Frequency   Subjective    Urinary Frequency  Episode onset: 1 month ago. The problem has been waxing and waning. The quality of the pain is described as burning. Associated symptoms include frequency. Pertinent negatives include no chills, nausea or vomiting. Treatments tried: OTC UTI medication. The treatment provided mild relief.   Medications: Outpatient Medications Prior to Visit  Medication Sig   carisoprodol (SOMA) 350 MG tablet TAKE 1 TABLET BY MOUTH TWICE DAILY AS NEEDED   Cholecalciferol (VITAMIN D3) 50 MCG (2000 UT) CAPS Take 2 capsules (4,000 Units total) by mouth daily.   clopidogrel (PLAVIX) 75 MG tablet Take 1 tablet (75 mg total) by mouth daily.   Cyanocobalamin (B-12 COMPLIANCE INJECTION) 1000 MCG/ML KIT Inject as directed every 30 (thirty) days.   ENSTILAR 0.005-0.064 % FOAM Apply  a small amount to skin as directed  qd up to 5 days per week to aa psoriasis on body, avoid face, groin, axilla   fluticasone (FLONASE) 50 MCG/ACT nasal spray Place 2 sprays into both nostrils daily.   fluticasone-salmeterol (ADVAIR) 250-50 MCG/ACT AEPB Inhale 1 puff into the lungs in the morning and at bedtime.   gabapentin (NEURONTIN) 100 MG capsule TAKE 2 CAPSULES(200 MG) BY MOUTH THREE TIMES DAILY IN ADDITION TO 600 MG GABAPENTIN TABLETS   gabapentin (NEURONTIN) 600 MG tablet TAKE 1 TABLET(600 MG) BY MOUTH TWICE DAILY   lansoprazole (PREVACID) 30 MG capsule Take 1 capsule (30 mg total) by mouth daily.   nortriptyline (PAMELOR) 50 MG capsule  TAKE 1 CAPSULE(50 MG) BY MOUTH AT BEDTIME   prochlorperazine (COMPAZINE) 10 MG tablet Take 1 tablet (10 mg total) by mouth every 6 (six) hours as needed for nausea or vomiting (or headache).   rosuvastatin (CRESTOR) 20 MG tablet TAKE 1 TABLET(20 MG) BY MOUTH DAILY   triamcinolone ointment (KENALOG) 0.1 % APPLY EXTERNALLY TO THE AFFECTED AREA TWICE DAILY AS NEEDED   Vitamin D, Ergocalciferol, (DRISDOL) 1.25 MG (50000 UNIT) CAPS capsule Take 1 capsule (50,000 Units total) by mouth every 7 (seven) days.   No facility-administered medications prior to visit.    Review of Systems  Constitutional:  Negative for appetite change, chills, fatigue and fever.  Respiratory:  Negative for chest tightness and shortness of breath.   Cardiovascular:  Negative for chest pain and palpitations.  Gastrointestinal:  Positive for abdominal pain. Negative for nausea and vomiting.  Genitourinary:  Positive for dysuria and frequency.  Neurological:  Negative for dizziness and weakness.       Objective    BP 127/61 (BP Location: Left Arm, Patient Position: Sitting, Cuff Size: Normal)   Pulse 90   Temp 98 F (36.7 C) (Oral)   Resp 16   Wt 126 lb (57.2 kg)   SpO2 100% Comment: room air  BMI 22.32 kg/m    Physical Exam  General appearance: Well developed, well nourished female, cooperative and in no acute distress Head: Normocephalic, without obvious abnormality, atraumatic Respiratory: Respirations even and unlabored, normal respiratory rate Extremities: All extremities are  intact.  Skin: Skin color, texture, turgor normal. No rashes seen  Psych: Appropriate mood and affect. Neurologic: Mental status: Alert, oriented to person, place, and time, thought content appropriate.   Results for orders placed or performed in visit on 02/01/22  POCT Urinalysis Dipstick  Result Value Ref Range   Color, UA amber    Clarity, UA clear    Glucose, UA Negative Negative   Bilirubin, UA small    Ketones, UA  negative    Spec Grav, UA 1.010 1.010 - 1.025   Blood, UA trace (non hemolyzed)    pH, UA 7.0 5.0 - 8.0   Protein, UA Positive (A) Negative   Urobilinogen, UA 0.2 0.2 or 1.0 E.U./dL   Nitrite, UA positive    Leukocytes, UA Moderate (2+) (A) Negative   Appearance     Odor      Assessment & Plan     1. Urinary frequency  2. Urinary tract infection with hematuria, site unspecified - Urine Culture - cephALEXin (KEFLEX) 500 MG capsule; Take 1 capsule (500 mg total) by mouth 3 (three) times daily for 7 days.  Dispense: 21 capsule; Refill: 0   3. B12 deficiency B12 injection given today     The entirety of the information documented in the History of Present Illness, Review of Systems and Physical Exam were personally obtained by me. Portions of this information were initially documented by the CMA and reviewed by me for thoroughness and accuracy.     Donald Fisher, MD  [facility] [phone] (phone) [phone] (fax)  [facility]  

## 2022-02-04 LAB — URINE CULTURE

## 2022-02-08 ENCOUNTER — Ambulatory Visit: Payer: Medicare Other

## 2022-02-13 ENCOUNTER — Other Ambulatory Visit: Payer: Self-pay | Admitting: Family Medicine

## 2022-02-25 ENCOUNTER — Other Ambulatory Visit: Payer: Self-pay | Admitting: Family Medicine

## 2022-03-01 ENCOUNTER — Ambulatory Visit (INDEPENDENT_AMBULATORY_CARE_PROVIDER_SITE_OTHER): Payer: Medicare Other | Admitting: Family Medicine

## 2022-03-01 DIAGNOSIS — E538 Deficiency of other specified B group vitamins: Secondary | ICD-10-CM

## 2022-03-01 MED ORDER — CYANOCOBALAMIN 1000 MCG/ML IJ SOLN
1000.0000 ug | Freq: Once | INTRAMUSCULAR | Status: AC
  Administered 2022-03-01: 1000 ug via INTRAMUSCULAR

## 2022-03-01 NOTE — Progress Notes (Signed)
B12 injection only

## 2022-03-21 ENCOUNTER — Telehealth: Payer: Self-pay

## 2022-03-21 ENCOUNTER — Other Ambulatory Visit: Payer: Self-pay

## 2022-03-21 ENCOUNTER — Ambulatory Visit (INDEPENDENT_AMBULATORY_CARE_PROVIDER_SITE_OTHER): Payer: Medicare Other

## 2022-03-21 VITALS — Wt 126.0 lb

## 2022-03-21 DIAGNOSIS — Z1231 Encounter for screening mammogram for malignant neoplasm of breast: Secondary | ICD-10-CM

## 2022-03-21 DIAGNOSIS — Z800 Family history of malignant neoplasm of digestive organs: Secondary | ICD-10-CM

## 2022-03-21 DIAGNOSIS — Z1211 Encounter for screening for malignant neoplasm of colon: Secondary | ICD-10-CM

## 2022-03-21 DIAGNOSIS — Z0000 Encounter for general adult medical examination without abnormal findings: Secondary | ICD-10-CM

## 2022-03-21 MED ORDER — NA SULFATE-K SULFATE-MG SULF 17.5-3.13-1.6 GM/177ML PO SOLN: 1.0000 | Freq: Once | ORAL | 0 refills | Status: AC

## 2022-03-21 NOTE — Progress Notes (Signed)
Virtual Visit via Telephone Note  I connected with  Alexandria Rodriguez on 03/21/22 at  1:00 PM EST by telephone and verified that I am speaking with the correct person using two identifiers.  Location: Patient: home Provider: [facility] Persons participating in the virtual visit: Ruth   I discussed the limitations, risks, security and privacy concerns of performing an evaluation and management service by telephone and the availability of in person appointments. The patient expressed understanding and agreed to proceed.  Interactive audio and video telecommunications were attempted between this nurse and patient, however failed, due to patient having technical difficulties OR patient did not have access to video capability.  We continued and completed visit with audio only.  Some vital signs may be absent or patient reported.   Dionisio David, LPN  Subjective:   Alexandria Rodriguez is a 71 y.o. female who presents for Medicare Annual (Subsequent) preventive examination.  Review of Systems     Cardiac Risk Factors include: advanced age (>73mn, >>4women)     Objective:    Today's Vitals   03/21/22 1302  PainSc: 7    There is no height or weight on file to calculate BMI.     03/21/2022    1:08 PM 11/16/2020    4:28 PM 09/08/2020   12:36 PM 12/11/2019    9:30 AM 12/17/2018    4:35 PM 08/29/2018    3:23 PM 02/11/2018    3:38 PM  Advanced Directives  Does Patient Have a Medical Advance Directive? _0  No No  Would patient like information on creating a medical advance directive? No - Patient declined No - Patient declined No - Patient declined No - Patient declined Yes (MAU/Ambulatory/Procedural Areas - Information given)  Yes (MAU/Ambulatory/Procedural Areas - Information given)    Current Medications (verified) Outpatient Encounter Medications as of 03/21/2022  Medication Sig   carisoprodol (SOMA) 350 MG tablet TAKE 1 TABLET BY MOUTH TWICE DAILY AS NEEDED    Cholecalciferol (VITAMIN D3) 50 MCG (2000 UT) CAPS Take 2 capsules (4,000 Units total) by mouth daily.   clopidogrel (PLAVIX) 75 MG tablet Take 1 tablet (75 mg total) by mouth daily.   Cyanocobalamin (B-12 COMPLIANCE INJECTION) 1000 MCG/ML KIT Inject as directed every 30 (thirty) days.   ENSTILAR 0.005-0.064 % FOAM Apply  a small amount to skin as directed  qd up to 5 days per week to aa psoriasis on body, avoid face, groin, axilla   fluticasone-salmeterol (ADVAIR) 250-50 MCG/ACT AEPB Inhale 1 puff into the lungs in the morning and at bedtime.   gabapentin (NEURONTIN) 600 MG tablet TAKE 1 TABLET(600 MG) BY MOUTH TWICE DAILY   lansoprazole (PREVACID) 30 MG capsule TAKE 1 CAPSULE(30 MG) BY MOUTH DAILY   nortriptyline (PAMELOR) 50 MG capsule TAKE 1 CAPSULE(50 MG) BY MOUTH AT BEDTIME   rosuvastatin (CRESTOR) 20 MG tablet TAKE 1 TABLET(20 MG) BY MOUTH DAILY   triamcinolone ointment (KENALOG) 0.1 % APPLY EXTERNALLY TO THE AFFECTED AREA TWICE DAILY AS NEEDED   Vitamin D, Ergocalciferol, (DRISDOL) 1.25 MG (50000 UNIT) CAPS capsule Take 1 capsule (50,000 Units total) by mouth every 7 (seven) days.   fluticasone (FLONASE) 50 MCG/ACT nasal spray Place 2 sprays into both nostrils daily. (Patient not taking: Reported on 03/21/2022)   gabapentin (NEURONTIN) 100 MG capsule TAKE 2 CAPSULES(200 MG) BY MOUTH THREE TIMES DAILY IN ADDITION TO 600 MG GABAPENTIN TABLETS   prochlorperazine (COMPAZINE) 10 MG tablet Take 1 tablet (10 mg  total) by mouth every 6 (six) hours as needed for nausea or vomiting (or headache). (Patient not taking: Reported on 03/21/2022)   No facility-administered encounter medications on file as of 03/21/2022.    Allergies (verified) Effexor xr [venlafaxine hcl er], Fosamax [alendronate sodium], Percocet [oxycodone-acetaminophen], Erythromycin, Lidoderm [lidocaine], Morphine and related, Sulfa antibiotics, Sulfasalazine, and Tramadol   History: Past Medical History:  Diagnosis Date    Anxiety    Cataract    Cerebrovascular disease    Chronic pain    back pain   Contact dermatitis    COPD (chronic obstructive pulmonary disease) ([place])    Depression    Diverticulosis 10/31/2010   On Tranverse colon   GERD (gastroesophageal reflux disease)    Humerus fracture 04/10/2016   Hyperlipidemia    Myasthenia gravis ([place]) 02/2013   Diagnosed by doctor Manuella Ghazi   Neuropathy    Osteoporosis    Stroke [facility])    Vitamin D deficiency    Past Surgical History:  Procedure Laterality Date   ABDOMINAL HYSTERECTOMY  2000   due to excessive bleeeding   CERVICAL CONE BIOPSY     Cervical discectomy and fusion  [facility]   COLONOSCOPY WITH PROPOFOL N/A 12/06/2015   Procedure: COLONOSCOPY WITH PROPOFOL;  Surgeon: Manya Silvas, MD;  Location: [place];  Service: Endoscopy;  Laterality: N/A;   ESOPHAGOGASTRODUODENOSCOPY N/A 10/05/2014   Procedure: ESOPHAGOGASTRODUODENOSCOPY (EGD);  Surgeon: Manya Silvas, MD;  Location: [facility] ENDOSCOPY;  Service: Endoscopy;  Laterality: N/A;   Repeat cuff surgery  2004   Developed adhesive capsulitis   ROTATOR CUFF REPAIR Right 2003   Shoulder   Family History  Problem Relation Age of Onset   Colon cancer Mother    Stroke Father    Cancer Sister        Ovarian, stable   Breast cancer Maternal Grandmother    Social History   Socioeconomic History   Marital status: Married    Spouse name: Jaquelyn Bitter   Number of children: 3   Years of education: Not on file   Highest education level: Some college, no degree  Occupational History   Occupation: disabled  Tobacco Use   Smoking status: Every Day    Packs/day: 0.50    Years: 42.00    Total pack years: 21.00    Types: Cigarettes   Smokeless tobacco: Never   Tobacco comments:     Has been smoking for 30+ years, Cigarettes per day;10.  Vaping Use   Vaping Use: Never used  Substance and Sexual Activity   Alcohol use: No    Alcohol/week: 0.0 standard drinks of  alcohol   Drug use: No   Sexual activity: Not on file  Other Topics Concern   Not on file  Social History Narrative   Ambulates with walker rollator or using assitance holding on to walls or solid surfaces.    Social Determinants of Health   Financial Resource Strain: Low Risk  (03/21/2022)   Overall Financial Resource Strain (CARDIA)    Difficulty of Paying Living Expenses: Not hard at all  Food Insecurity: No Food Insecurity (03/21/2022)   Hunger Vital Sign    Worried About Running Out of Food in the Last Year: Never true    Ran Out of Food in the Last Year: Never true  Transportation Needs: No Transportation Needs (03/21/2022)   PRAPARE - Hydrologist (Medical): No    Lack of Transportation (Non-Medical): No  Physical Activity: Inactive (03/21/2022)   Exercise Vital Sign    Days of Exercise per Week: 0 days    Minutes of Exercise per Session: 0 min  Stress: No Stress Concern Present (03/21/2022)   [place]    Feeling of Stress : Not at all  Social Connections: Socially Isolated (03/21/2022)   Social Connection and Isolation Panel [NHANES]    Frequency of Communication with Friends and Family: Once a week    Frequency of Social Gatherings with Friends and Family: Never    Attends Religious Services: Never    Marine scientist or Organizations: No    Attends Music therapist: Never    Marital Status: Married    Tobacco Counseling Ready to quit: Not Answered Counseling given: Not Answered Tobacco comments:  Has been smoking for 30+ years, Cigarettes per day;10.   Clinical Intake:  Pre-visit preparation completed: Yes  Pain : 0-10 Pain Score: 7  Pain Type: Neuropathic pain Pain Location: Foot Pain Relieving Factors: pt feet up on pillow  Pain Relieving Factors: pt feet up on pillow  Nutritional Risks: None Diabetes: No  How often do you need to  have someone help you when you read instructions, pamphlets, or other written materials from your doctor or pharmacy?: 1 - Never  Diabetic?no  Interpreter Needed?: No  Information entered by :: Kirke Shaggy, LPN   Activities of Daily Living    03/21/2022    1:10 PM 08/30/2021    1:54 PM  In your present state of health, do you have any difficulty performing the following activities:  Hearing? 0 1  Vision? 0 0  Difficulty concentrating or making decisions? 0 1  Walking or climbing stairs? 1 1  Dressing or bathing? 0 0  Doing errands, shopping? 1 0  Preparing Food and eating ? N   Using the Toilet? N   In the past six months, have you accidently leaked urine? N   Do you have problems with loss of bowel control? N   Managing your Medications? N   Managing your Finances? N   Housekeeping or managing your Housekeeping? Y     Patient Care Team: Birdie Sons, MD as PCP - General (Family Medicine) Kate Sable, MD as PCP - Cardiology (Cardiology) Clydell Hakim, MD (Inactive) as Consulting Physician (Anesthesiology) [facility], [facility]) Drenda Freeze, MD as Referring Physician (Neurology)  Indicate any recent Medical Services you may have received from other than [facility] providers in the past year (date may be approximate).     Assessment:   This is a routine wellness examination for Alexandria Rodriguez.  Hearing/Vision screen Hearing Screening - Comments:: No aids Vision Screening - Comments:: Wears glasses- [facility]  Dietary issues and exercise activities discussed: Current Exercise Habits: The patient does not participate in regular exercise at present   Goals Addressed             This Visit's Progress    DIET - EAT MORE FRUITS AND VEGETABLES         Depression Screen    03/21/2022    1:06 PM 08/30/2021    1:53 PM 03/29/2021    2:03 PM 03/12/2020    3:36 PM 12/11/2019    9:26 AM 02/11/2018    2:58 PM 01/10/2017    2:52 PM  PHQ 2/9 Scores  PHQ -  2 Score 0 0 0 0 0 2 1  PHQ- 9 Score  0 _0 Fall Risk    03/21/2022    1:09 PM 08/30/2021    1:53 PM 03/29/2021    2:05 PM 12/11/2019    9:30 AM 02/11/2018    2:58 PM  Fall Risk   Falls in the past year? 1 1 0 1 0  Comment     near miss falls due impaired gait  Number falls in past yr: 1 0 0 1 0  Injury with Fall? 0 1 0 0   Risk for fall due to : History of fall(s)  Impaired balance/gait Medication side effect Impaired mobility  Follow up Falls prevention discussed;Falls evaluation completed Falls evaluation completed Falls evaluation completed;Education provided;Falls prevention discussed Falls prevention discussed Falls prevention discussed;Falls evaluation completed    FALL RISK PREVENTION PERTAINING TO THE HOME:  Any stairs in or around the home? Yes  If so, are there any without handrails? No  Home free of loose throw rugs in walkways, pet beds, electrical cords, etc? Yes  Adequate lighting in your home to reduce risk of falls? Yes   ASSISTIVE DEVICES UTILIZED TO PREVENT FALLS:  Life alert? No  Use of a cane, walker or w/c? Yes  Grab bars in the bathroom? Yes  Shower chair or bench in shower? Yes  Elevated toilet seat or a handicapped toilet? No    Cognitive Function:        03/21/2022    1:12 PM 02/11/2018    3:10 PM 01/10/2017    2:10 PM  6CIT Screen  What Year? 0 points 0 points 0 points  What month? 0 points 0 points 0 points  What time? 0 points 0 points 0 points  Count back from 20 0 points 0 points 0 points  Months in reverse 0 points 0 points 0 points  Repeat phrase 4 points 4 points 2 points  Total Score 4 points 4 points 2 points    Immunizations Immunization History  Administered Date(s) Administered   Fluad Quad(high Dose 65+) 03/12/2020, 02/02/2021   Influenza Split 02/16/2011   Influenza, High Dose Seasonal PF 12/28/2015, 01/10/2017, 02/11/2018   Influenza,inj,Quad PF,6+ Mos 12/27/2012, 12/31/2013   Pneumococcal Conjugate-13  01/10/2017   Pneumococcal Polysaccharide-23 02/16/2011, 02/11/2018    TDAP status: Due, Education has been provided regarding the importance of this vaccine. Advised may receive this vaccine at local pharmacy or Health Dept. Aware to provide a copy of the vaccination record if obtained from local pharmacy or Health Dept. Verbalized acceptance and understanding.  Flu Vaccine status: Up to date  Pneumococcal vaccine status: Up to date  Covid-19 vaccine status: Declined, Education has been provided regarding the importance of this vaccine but patient still declined. Advised may receive this vaccine at local pharmacy or Health Dept.or vaccine clinic. Aware to provide a copy of the vaccination record if obtained from local pharmacy or Health Dept. Verbalized acceptance and understanding.  Qualifies for Shingles Vaccine? Yes   Zostavax completed No   Shingrix Completed?: No.    Education has been provided regarding the importance of this vaccine. Patient has been advised to call insurance company to determine out of pocket expense if they have not yet received this vaccine. Advised may also receive vaccine at local pharmacy or Health Dept. Verbalized acceptance and understanding.  Screening Tests Health Maintenance  Topic Date Due   COVID-19 Vaccine (1) Never done   DTaP/Tdap/Td (1 - Tdap) Never done   Zoster Vaccines- Shingrix (1 of 2) Never  done   DEXA SCAN  01/24/2015   MAMMOGRAM  12/03/2020   COLONOSCOPY (Pts 45-17yr Insurance coverage will need to be confirmed)  12/05/2020   INFLUENZA VACCINE  11/08/2021   Lung Cancer Screening  05/30/2022   Medicare Annual Wellness (AWV)  03/22/2023   Pneumonia Vaccine 71 Years old  Completed   Hepatitis C Screening  Completed   HPV VACCINES  Aged Out    Health Maintenance  Health Maintenance Due  Topic Date Due   COVID-19 Vaccine (1) Never done   DTaP/Tdap/Td (1 - Tdap) Never done   Zoster Vaccines- Shingrix (1 of 2) Never done   DEXA SCAN   01/24/2015   MAMMOGRAM  12/03/2020   COLONOSCOPY (Pts 45-489yrInsurance coverage will need to be confirmed)  12/05/2020   INFLUENZA VACCINE  11/08/2021    Colorectal cancer screening: Type of screening: Colonoscopy. Completed 12/06/15. Repeat every 5 years  Mammogram status: Ordered 03/21/22. Pt provided with contact info and advised to call to schedule appt.   Bone Density status: Completed 01/23/13. Results reflect: Bone density results: OSTEOPOROSIS. Repeat every 2 years.- declined referral  Lung Cancer Screening: (Low Dose CT Chest recommended if Age 71-80ears, 30 pack-year currently smoking OR have quit w/in 15years.) does not qualify. -   Additional Screening:  Hepatitis C Screening: does qualify; Completed 06/18/18  Vision Screening: Recommended annual ophthalmology exams for early detection of glaucoma and other disorders of the eye. Is the patient up to date with their annual eye exam?  Yes  Who is the provider or what is the name of the office in which the patient attends annual eye exams? [facility] is not established with a provider, would they like to be referred to a provider to establish care? No .   Dental Screening: Recommended annual dental exams for proper oral hygiene  Community Resource Referral / Chronic Care Management: CRR required this visit?  No   CCM required this visit?  No      Plan:     I have personally reviewed and noted the following in the patient's chart:   Medical and social history Use of alcohol, tobacco or illicit drugs  Current medications and supplements including opioid prescriptions. Patient is not currently taking opioid prescriptions. Functional ability and status Nutritional status Physical activity Advanced directives List of other physicians Hospitalizations, surgeries, and ER visits in previous 12 months Vitals Screenings to include cognitive, depression, and falls Referrals and appointments  In addition, I  have reviewed and discussed with patient certain preventive protocols, quality metrics, and best practice recommendations. A written personalized care plan for preventive services as well as general preventive health recommendations were provided to patient.     LoDionisio DavidLPN   [id number] Nurse Notes: none

## 2022-03-21 NOTE — Telephone Encounter (Signed)
Gastroenterology Pre-Procedure Review  Request Date: 04/18/22 Requesting Physician: Dr. Vicente Males  PATIENT REVIEW QUESTIONS: The patient responded to the following health history questions as indicated:    1. Are you having any GI issues? no 2. Do you have a personal history of Polyps? no patients last colonoscopy was in 2017 no history of colon polyps but mother died of colon cancer 3. Do you have a family history of Colon Cancer or Polyps? yes (mother colon cancer) 4. Diabetes Mellitus? no 5. Joint replacements in the past 12 months?no 6. Major health problems in the past 3 months?no 7. Any artificial heart valves, MVP, or defibrillator?no    MEDICATIONS & ALLERGIES:    Patient reports the following regarding taking any anticoagulation/antiplatelet therapy:   Plavix, Coumadin, Eliquis, Xarelto, Lovenox, Pradaxa, Brilinta, or Effient? Plavix blood thinner request sent to Dr. Sabino Snipes office Aspirin? no  Patient confirms/reports the following medications:  Current Outpatient Medications  Medication Sig Dispense Refill   carisoprodol (SOMA) 350 MG tablet TAKE 1 TABLET BY MOUTH TWICE DAILY AS NEEDED 60 tablet 1   Cholecalciferol (VITAMIN D3) 50 MCG (2000 UT) CAPS Take 2 capsules (4,000 Units total) by mouth daily.     clopidogrel (PLAVIX) 75 MG tablet Take 1 tablet (75 mg total) by mouth daily. 90 tablet 4   Cyanocobalamin (B-12 COMPLIANCE INJECTION) 1000 MCG/ML KIT Inject as directed every 30 (thirty) days.     ENSTILAR 0.005-0.064 % FOAM Apply  a small amount to skin as directed  qd up to 5 days per week to aa psoriasis on body, avoid face, groin, axilla     fluticasone (FLONASE) 50 MCG/ACT nasal spray Place 2 sprays into both nostrils daily. (Patient not taking: Reported on 03/21/2022) 16 g 3   fluticasone-salmeterol (ADVAIR) 250-50 MCG/ACT AEPB Inhale 1 puff into the lungs in the morning and at bedtime. 1 each 2   gabapentin (NEURONTIN) 100 MG capsule TAKE 2 CAPSULES(200 MG) BY MOUTH THREE  TIMES DAILY IN ADDITION TO 600 MG GABAPENTIN TABLETS 180 capsule 4   gabapentin (NEURONTIN) 600 MG tablet TAKE 1 TABLET(600 MG) BY MOUTH TWICE DAILY 60 tablet 12   lansoprazole (PREVACID) 30 MG capsule TAKE 1 CAPSULE(30 MG) BY MOUTH DAILY 30 capsule 5   nortriptyline (PAMELOR) 50 MG capsule TAKE 1 CAPSULE(50 MG) BY MOUTH AT BEDTIME 30 capsule 11   prochlorperazine (COMPAZINE) 10 MG tablet Take 1 tablet (10 mg total) by mouth every 6 (six) hours as needed for nausea or vomiting (or headache). (Patient not taking: Reported on 03/21/2022) 20 tablet 0   rosuvastatin (CRESTOR) 20 MG tablet TAKE 1 TABLET(20 MG) BY MOUTH DAILY 90 tablet 4   triamcinolone ointment (KENALOG) 0.1 % APPLY EXTERNALLY TO THE AFFECTED AREA TWICE DAILY AS NEEDED 454 g 1   Vitamin D, Ergocalciferol, (DRISDOL) 1.25 MG (50000 UNIT) CAPS capsule Take 1 capsule (50,000 Units total) by mouth every 7 (seven) days. 12 capsule 3   No current facility-administered medications for this visit.    Patient confirms/reports the following allergies:  Allergies  Allergen Reactions   Effexor Xr [Venlafaxine Hcl Er] Other (See Comments)    Reaction:  GI upset    Fosamax [Alendronate Sodium] Nausea And Vomiting   Percocet [Oxycodone-Acetaminophen] Swelling   Erythromycin Swelling and Rash    Pt states that her tongue swells.     Lidoderm [Lidocaine] Swelling and Rash   Morphine And Related Swelling, Rash and Other (See Comments)    Pt states that her tongue swells.  Pt states that her tongue swells.     Sulfa Antibiotics Swelling and Rash    TONGUE SWELLING   Sulfasalazine Rash and Swelling   Tramadol Swelling and Rash    No orders of the defined types were placed in this encounter.   AUTHORIZATION INFORMATION Primary Insurance: 1D#: Group #:  Secondary Insurance: 1D#: Group #:  SCHEDULE INFORMATION: Date: 04/18/22 Time: Location: [facility]

## 2022-03-21 NOTE — Patient Instructions (Signed)
[name] , Thank you for taking time to come for your Medicare Wellness Visit. I appreciate your ongoing commitment to your health goals. Please review the following plan we discussed and let me know if I can assist you in the future.   Screening recommendations/referrals: Colonoscopy: 12/06/15, referral sent Mammogram: 12/04/18, referral sent Bone Density: 01/23/13, declined referral Recommended yearly ophthalmology/optometry visit for glaucoma screening and checkup Recommended yearly dental visit for hygiene and checkup  Vaccinations: Influenza vaccine: 02/02/21 Pneumococcal vaccine: 02/11/18 Tdap vaccine: n/d Shingles vaccine: n/d   Covid-19:n/d  Advanced directives: no  Conditions/risks identified: none  Next appointment: Follow up in one year for your annual wellness visit 03/26/23 @ 1:45 pm by phone   Preventive Care 65 Years and Older, Female Preventive care refers to lifestyle choices and visits with your health care provider that can promote health and wellness. What does preventive care include? A yearly physical exam. This is also called an annual well check. Dental exams once or twice a year. Routine eye exams. Ask your health care provider how often you should have your eyes checked. Personal lifestyle choices, including: Daily care of your teeth and gums. Regular physical activity. Eating a healthy diet. Avoiding tobacco and drug use. Limiting alcohol use. Practicing safe sex. Taking low-dose aspirin every day. Taking vitamin and mineral supplements as recommended by your health care provider. What happens during an annual well check? The services and screenings done by your health care provider during your annual well check will depend on your age, overall health, lifestyle risk factors, and family history of disease. Counseling  Your health care provider may ask you questions about your: Alcohol use. Tobacco use. Drug use. Emotional well-being. Home and  relationship well-being. Sexual activity. Eating habits. History of falls. Memory and ability to understand (cognition). Work and work Statistician. Reproductive health. Screening  You may have the following tests or measurements: Height, weight, and BMI. Blood pressure. Lipid and cholesterol levels. These may be checked every 5 years, or more frequently if you are over 71 years old. Skin check. Lung cancer screening. You may have this screening every year starting at age 48 if you have a 30-pack-year history of smoking and currently smoke or have quit within the past 15 years. Fecal occult blood test (FOBT) of the stool. You may have this test every year starting at age 48. Flexible sigmoidoscopy or colonoscopy. You may have a sigmoidoscopy every 5 years or a colonoscopy every 10 years starting at age 6. Hepatitis C blood test. Hepatitis B blood test. Sexually transmitted disease (STD) testing. Diabetes screening. This is done by checking your blood sugar (glucose) after you have not eaten for a while (fasting). You may have this done every 1-3 years. Bone density scan. This is done to screen for osteoporosis. You may have this done starting at age 2. Mammogram. This may be done every 1-2 years. Talk to your health care provider about how often you should have regular mammograms. Talk with your health care provider about your test results, treatment options, and if necessary, the need for more tests. Vaccines  Your health care provider may recommend certain vaccines, such as: Influenza vaccine. This is recommended every year. Tetanus, diphtheria, and acellular pertussis (Tdap, Td) vaccine. You may need a Td booster every 10 years. Zoster vaccine. You may need this after age 44. Pneumococcal 13-valent conjugate (PCV13) vaccine. One dose is recommended after age 17. Pneumococcal polysaccharide (PPSV23) vaccine. One dose is recommended after age 70. Talk  to your health care provider  about which screenings and vaccines you need and how often you need them. This information is not intended to replace advice given to you by your health care provider. Make sure you discuss any questions you have with your health care provider. Document Released: 04/23/2015 Document Revised: 12/15/2015 Document Reviewed: 01/26/2015 Elsevier Interactive Patient Education  2017 [employer] Prevention in the Home Falls can cause injuries. They can happen to people of all ages. There are many things you can do to make your home safe and to help prevent falls. What can I do on the outside of my home? Regularly fix the edges of walkways and driveways and fix any cracks. Remove anything that might make you trip as you walk through a door, such as a raised step or threshold. Trim any bushes or trees on the path to your home. Use bright outdoor lighting. Clear any walking paths of anything that might make someone trip, such as rocks or tools. Regularly check to see if handrails are loose or broken. Make sure that both sides of any steps have handrails. Any raised decks and porches should have guardrails on the edges. Have any leaves, snow, or ice cleared regularly. Use sand or salt on walking paths during winter. Clean up any spills in your garage right away. This includes oil or grease spills. What can I do in the bathroom? Use night lights. Install grab bars by the toilet and in the tub and shower. Do not use towel bars as grab bars. Use non-skid mats or decals in the tub or shower. If you need to sit down in the shower, use a plastic, non-slip stool. Keep the floor dry. Clean up any water that spills on the floor as soon as it happens. Remove soap buildup in the tub or shower regularly. Attach bath mats securely with double-sided non-slip rug tape. Do not have throw rugs and other things on the floor that can make you trip. What can I do in the bedroom? Use night lights. Make sure  that you have a light by your bed that is easy to reach. Do not use any sheets or blankets that are too big for your bed. They should not hang down onto the floor. Have a firm chair that has side arms. You can use this for support while you get dressed. Do not have throw rugs and other things on the floor that can make you trip. What can I do in the kitchen? Clean up any spills right away. Avoid walking on wet floors. Keep items that you use a lot in easy-to-reach places. If you need to reach something above you, use a strong step stool that has a grab bar. Keep electrical cords out of the way. Do not use floor polish or wax that makes floors slippery. If you must use wax, use non-skid floor wax. Do not have throw rugs and other things on the floor that can make you trip. What can I do with my stairs? Do not leave any items on the stairs. Make sure that there are handrails on both sides of the stairs and use them. Fix handrails that are broken or loose. Make sure that handrails are as long as the stairways. Check any carpeting to make sure that it is firmly attached to the stairs. Fix any carpet that is loose or worn. Avoid having throw rugs at the top or bottom of the stairs. If you do have throw rugs,  attach them to the floor with carpet tape. Make sure that you have a light switch at the top of the stairs and the bottom of the stairs. If you do not have them, ask someone to add them for you. What else can I do to help prevent falls? Wear shoes that: Do not have high heels. Have rubber bottoms. Are comfortable and fit you well. Are closed at the toe. Do not wear sandals. If you use a stepladder: Make sure that it is fully opened. Do not climb a closed stepladder. Make sure that both sides of the stepladder are locked into place. Ask someone to hold it for you, if possible. Clearly mark and make sure that you can see: Any grab bars or handrails. First and last steps. Where the edge of  each step is. Use tools that help you move around (mobility aids) if they are needed. These include: Canes. Walkers. Scooters. Crutches. Turn on the lights when you go into a dark area. Replace any light bulbs as soon as they burn out. Set up your furniture so you have a clear path. Avoid moving your furniture around. If any of your floors are uneven, fix them. If there are any pets around you, be aware of where they are. Review your medicines with your doctor. Some medicines can make you feel dizzy. This can increase your chance of falling. Ask your doctor what other things that you can do to help prevent falls. This information is not intended to replace advice given to you by your health care provider. Make sure you discuss any questions you have with your health care provider. Document Released: 01/21/2009 Document Revised: 09/02/2015 Document Reviewed: 05/01/2014 Elsevier Interactive Patient Education  2017 [employer].

## 2022-03-22 ENCOUNTER — Ambulatory Visit (INDEPENDENT_AMBULATORY_CARE_PROVIDER_SITE_OTHER): Payer: Medicare Other

## 2022-03-22 DIAGNOSIS — E538 Deficiency of other specified B group vitamins: Secondary | ICD-10-CM

## 2022-03-22 MED ORDER — CYANOCOBALAMIN 1000 MCG/ML IJ SOLN
1000.0000 ug | Freq: Once | INTRAMUSCULAR | Status: AC
  Administered 2022-03-22: 1000 ug via INTRAMUSCULAR

## 2022-03-27 ENCOUNTER — Telehealth: Payer: Self-pay

## 2022-03-27 NOTE — Telephone Encounter (Signed)
Blood thinner request received from Dr. Caryn Section on 03/24/23.  Patient has been advised to stop Plavix 5 days prior to colonoscopy on 04/13/22 and restart 1 day after procedure on 04/19/22.  Patient verbalized understanding.  Thanks,  [place], [place]

## 2022-03-28 ENCOUNTER — Other Ambulatory Visit: Payer: Self-pay | Admitting: Family Medicine

## 2022-03-28 ENCOUNTER — Telehealth: Payer: Self-pay | Admitting: Family Medicine

## 2022-03-28 DIAGNOSIS — R051 Acute cough: Secondary | ICD-10-CM

## 2022-03-28 NOTE — Telephone Encounter (Signed)
Medication Refill - Medication: gabapentin (NEURONTIN) 100 MG capsule   Has the patient contacted their pharmacy? Yes.   She said it was denied  Preferred Pharmacy (with phone number or street name): [facility] [place], [place]  Has the patient been seen for an appointment in the last year OR does the patient have an upcoming appointment? Yes.    Agent: Please be advised that RX refills may take up to 3 business days. We ask that you follow-up with your pharmacy.

## 2022-03-28 NOTE — Telephone Encounter (Signed)
Pt states the  rosuvastatin (CRESTOR) 20 MG tablet is too expensive, over $50.  Would like to know if there is an alternate she can take?  In addition, the fluticasone-salmeterol (ADVAIR) 250-50 MCG/ACT AEPB is also over $50.  Pt would like an alternate as well.  [facility] [place], [place]

## 2022-03-29 MED ORDER — GABAPENTIN 100 MG PO CAPS: ORAL_CAPSULE | ORAL | 4 refills | Status: DC

## 2022-03-29 MED ORDER — ATORVASTATIN CALCIUM 20 MG PO TABS: 20.0000 mg | ORAL_TABLET | Freq: Every day | ORAL | 3 refills | Status: DC

## 2022-03-29 NOTE — Telephone Encounter (Signed)
Please advise Advair also?

## 2022-03-29 NOTE — Telephone Encounter (Signed)
Have sent prescription for [facility]vastatin to [facility] to take instead of rosuvastatin

## 2022-03-29 NOTE — Telephone Encounter (Signed)
Requested medication (s) are due for refill today: routing for review  Requested medication (s) are on the active medication list: yes  Last refill:  11/01/21  Future visit scheduled:yes  Notes to clinic:  Unable to refill per protocol, cannot delegate. Medication needs sig, routing for review.      Requested Prescriptions  Pending Prescriptions Disp Refills   gabapentin (NEURONTIN) 100 MG capsule 180 capsule 4     Neurology: Anticonvulsants - gabapentin Failed - 03/28/2022  2:32 PM      Failed - Cr in normal range and within 360 days    Creatinine  Date Value Ref Range Status  05/13/2013 0.79 0.60 - 1.30 mg/dL Final   Creatinine, Ser  Date Value Ref Range Status  08/30/2021 1.13 (H) 0.57 - 1.00 mg/dL Final         Passed - Completed PHQ-2 or PHQ-9 in the last 360 days      Passed - Valid encounter within last 12 months    Recent Outpatient Visits           4 weeks ago B12 deficiency   [facility] Birdie Sons, [facility]   1 month ago Urinary tract infection with hematuria, site unspecified   [facility] Birdie Sons, MD   2 months ago B12 deficiency   [facility] Birdie Sons, MD   4 months ago Acute cough   [facility] Birdie Sons, MD   4 months ago Acute cough   [facility] [place], [place], [place]

## 2022-03-30 NOTE — Telephone Encounter (Signed)
Patient was advised.  

## 2022-03-30 NOTE — Telephone Encounter (Signed)
There are no inhalers that are less expensive and I can't find anything that is on lower tier on her formulary. She can check her insurance medication formulary and talk to her pharmacist.

## 2022-04-14 ENCOUNTER — Other Ambulatory Visit: Payer: Self-pay | Admitting: Family Medicine

## 2022-04-14 NOTE — Telephone Encounter (Signed)
Requested medication (s) are due for refill today - yes  Requested medication (s) are on the active medication list -yes  Future visit scheduled -no  Last refill: 02/13/22 #60 1RF  Notes to clinic: non delegated Rx  Requested Prescriptions  Pending Prescriptions Disp Refills   carisoprodol (SOMA) 350 MG tablet [Pharmacy Med Name: CARISOPRODOL '350MG'$  TABLETS] 60 tablet     Sig: TAKE 1 TABLET BY MOUTH TWICE DAILY AS NEEDED     Not Delegated - Analgesics:  Muscle Relaxants Failed - 04/14/2022  1:28 PM      Failed - This refill cannot be delegated      Passed - Valid encounter within last 6 months    Recent Outpatient Visits           1 month ago B12 deficiency   [facility] Birdie Sons, MD   2 months ago Urinary tract infection with hematuria, site unspecified   [facility] Birdie Sons, MD   3 months ago B12 deficiency   [facility] Birdie Sons, MD   5 months ago Acute cough   [facility] Birdie Sons, MD   5 months ago Acute cough   [facility] [place], [place], [place]                 Requested Prescriptions  Pending Prescriptions Disp Refills   carisoprodol (SOMA) 350 MG tablet [Pharmacy Med Name: CARISOPRODOL '350MG'$  TABLETS] 60 tablet     Sig: TAKE 1 TABLET BY MOUTH TWICE DAILY AS NEEDED     Not Delegated - Analgesics:  Muscle Relaxants Failed - 04/14/2022  1:28 PM      Failed - This refill cannot be delegated      Passed - Valid encounter within last 6 months    Recent Outpatient Visits           1 month ago B12 deficiency   [facility] Birdie Sons, MD   2 months ago Urinary tract infection with hematuria, site unspecified   [facility] Birdie Sons, MD   3 months ago B12 deficiency   [facility] Birdie Sons, MD   5 months ago Acute cough   [facility] Birdie Sons, MD   5 months ago Acute  cough   [facility] [place], [place], [place]

## 2022-04-17 ENCOUNTER — Encounter: Payer: Self-pay | Admitting: Gastroenterology

## 2022-04-18 ENCOUNTER — Encounter: Payer: Self-pay | Admitting: Anesthesiology

## 2022-04-18 ENCOUNTER — Encounter: Admission: RE | Payer: Self-pay | Source: Home / Self Care

## 2022-04-18 ENCOUNTER — Ambulatory Visit: Admission: RE | Admit: 2022-04-18 | Payer: Medicare Other | Source: Home / Self Care | Admitting: Gastroenterology

## 2022-04-18 SURGERY — COLONOSCOPY WITH PROPOFOL
Anesthesia: General

## 2022-04-26 ENCOUNTER — Ambulatory Visit (INDEPENDENT_AMBULATORY_CARE_PROVIDER_SITE_OTHER): Payer: Medicare Other

## 2022-04-26 DIAGNOSIS — E538 Deficiency of other specified B group vitamins: Secondary | ICD-10-CM | POA: Diagnosis not present

## 2022-04-26 MED ORDER — CYANOCOBALAMIN 1000 MCG/ML IJ SOLN
1000.0000 ug | Freq: Once | INTRAMUSCULAR | Status: AC
  Administered 2022-04-26: 1000 ug via INTRAMUSCULAR

## 2022-05-01 ENCOUNTER — Ambulatory Visit (INDEPENDENT_AMBULATORY_CARE_PROVIDER_SITE_OTHER): Payer: Medicare Other | Admitting: Family Medicine

## 2022-05-01 ENCOUNTER — Encounter: Payer: Self-pay | Admitting: Family Medicine

## 2022-05-01 VITALS — BP 114/60 | HR 82 | Temp 97.6°F | Ht 62.0 in | Wt 131.0 lb

## 2022-05-01 DIAGNOSIS — E559 Vitamin D deficiency, unspecified: Secondary | ICD-10-CM

## 2022-05-01 DIAGNOSIS — I6523 Occlusion and stenosis of bilateral carotid arteries: Secondary | ICD-10-CM | POA: Diagnosis not present

## 2022-05-01 DIAGNOSIS — E538 Deficiency of other specified B group vitamins: Secondary | ICD-10-CM

## 2022-05-01 DIAGNOSIS — G629 Polyneuropathy, unspecified: Secondary | ICD-10-CM | POA: Diagnosis not present

## 2022-05-01 DIAGNOSIS — H6992 Unspecified Eustachian tube disorder, left ear: Secondary | ICD-10-CM

## 2022-05-01 DIAGNOSIS — I739 Peripheral vascular disease, unspecified: Secondary | ICD-10-CM | POA: Diagnosis not present

## 2022-05-01 MED ORDER — DULOXETINE HCL 20 MG PO CPEP: 20.0000 mg | ORAL_CAPSULE | Freq: Every day | ORAL | 2 refills | Status: DC

## 2022-05-01 NOTE — Patient Instructions (Signed)
Please review the attached list of medications and notify my office if there are any errors.   Please go to the lab draw station in [address] on the second floor of [facility]. Normal hours are 8:00am to 11:30am and 1:00pm to 4:00pm Monday through Friday  

## 2022-05-01 NOTE — Progress Notes (Signed)
Established patient visit   Patient: Alexandria Rodriguez   DOB: 17-Apr-1950   72 y.o. Female  MRN: [id number] Visit Date: 05/01/2022  Today's healthcare provider: Lelon Huh, MD    Subjective    HPI  Pt stated--having pain both feet radiated to the calf--2 weeks ago. No injuries. Is worse when walking and improved, but does not resolve when resting. She has long history of neuropathy on gabapentin which is not controlling pain. She is concerned about circulation. She is followed by Dr .Lucky Cowboy for carotid artery disease.   Medications: Outpatient Medications Prior to Visit  Medication Sig   atorvastatin (LIPITOR) 20 MG tablet Take 1 tablet (20 mg total) by mouth daily.   carisoprodol (SOMA) 350 MG tablet TAKE 1 TABLET BY MOUTH TWICE DAILY AS NEEDED   Cholecalciferol (VITAMIN D3) 50 MCG (2000 UT) CAPS Take 2 capsules (4,000 Units total) by mouth daily.   clopidogrel (PLAVIX) 75 MG tablet Take 1 tablet (75 mg total) by mouth daily.   Cyanocobalamin (B-12 COMPLIANCE INJECTION) 1000 MCG/ML KIT Inject as directed every 30 (thirty) days.   ENSTILAR 0.005-0.064 % FOAM Apply  a small amount to skin as directed  qd up to 5 days per week to aa psoriasis on body, avoid face, groin, axilla   fluticasone (FLONASE) 50 MCG/ACT nasal spray Place 2 sprays into both nostrils daily.   fluticasone-salmeterol (ADVAIR) 250-50 MCG/ACT AEPB Inhale 1 puff into the lungs in the morning and at bedtime.   gabapentin (NEURONTIN) 100 MG capsule TAKE 2 CAPSULES(200 MG) BY MOUTH THREE TIMES DAILY IN ADDITION TO 600 MG GABAPENTIN TABLETS   gabapentin (NEURONTIN) 600 MG tablet TAKE 1 TABLET(600 MG) BY MOUTH TWICE DAILY   lansoprazole (PREVACID) 30 MG capsule TAKE 1 CAPSULE(30 MG) BY MOUTH DAILY   nortriptyline (PAMELOR) 50 MG capsule TAKE 1 CAPSULE(50 MG) BY MOUTH AT BEDTIME   prochlorperazine (COMPAZINE) 10 MG tablet Take 1 tablet (10 mg total) by mouth every 6 (six) hours as needed for nausea or vomiting (or headache).    triamcinolone ointment (KENALOG) 0.1 % APPLY EXTERNALLY TO THE AFFECTED AREA TWICE DAILY AS NEEDED   Vitamin D, Ergocalciferol, (DRISDOL) 1.25 MG (50000 UNIT) CAPS capsule Take 1 capsule (50,000 Units total) by mouth every 7 (seven) days.   No facility-administered medications prior to visit.    Review of Systems     Objective    BP 114/60 (BP Location: Left Arm, Patient Position: Sitting, Cuff Size: Normal)   Pulse 82   Temp 97.6 F (36.4 C)   Ht '5\' 2"'$  (1.575 m)   Wt 131 lb (59.4 kg)   SpO2 97%   BMI 23.96 kg/m    Physical Exam   General: Appearance:    Well developed, well nourished female in no acute distress  Eyes:    PERRL, conjunctiva/corneas clear, EOM's intact       Lungs:     Clear to auscultation bilaterally, respirations unlabored  Heart:    Normal heart rate. Normal rhythm. No murmurs, rubs, or gallops.    MS:   All extremities are intact.  Weak pedal pulses. Cap refill about 3 seconds  Neurologic:   Awake, alert, oriented x 3. No apparent focal neurological defect.         Assessment & Plan     1. Bilateral carotid artery stenosis Previously followed by Dr. Lucky Cowboy. She is overdue for follow up . - Ambulatory referral to Vascular Surgery  2. Polyneuropathy ([place]) Remains  symptomatic on gabapentin which is a bit sedating for her. Try adding DULoxetine (CYMBALTA) 20 MG capsule; Take 1 capsule (20 mg total) by mouth daily.  Dispense: 30 capsule; Refill: 2 - CBC - Renal function panel  3. Intermittent claudication ([place])  - Ambulatory referral to Vascular Surgery  4. Eustachian tube dysfunction, left Recommend OTC nasal steroid  5. Vitamin D deficiency Due to check VITAMIN D 25 Hydroxy (Vit-D Deficiency, Fractures)  6. B12 deficiency Due to check  Vitamin B12      The entirety of the information documented in the History of Present Illness, Review of Systems and Physical Exam were personally obtained by me. Portions of this information were initially  documented by the CMA and reviewed by me for thoroughness and accuracy.     Lelon Huh, MD  [place] [phone] (phone) [phone] (fax)  [place]

## 2022-05-04 DIAGNOSIS — E538 Deficiency of other specified B group vitamins: Secondary | ICD-10-CM | POA: Diagnosis not present

## 2022-05-04 DIAGNOSIS — E559 Vitamin D deficiency, unspecified: Secondary | ICD-10-CM | POA: Diagnosis not present

## 2022-05-04 DIAGNOSIS — G629 Polyneuropathy, unspecified: Secondary | ICD-10-CM | POA: Diagnosis not present

## 2022-05-05 LAB — RENAL FUNCTION PANEL
Albumin: 3.7 g/dL — ABNORMAL LOW (ref 3.8–4.8)
BUN/Creatinine Ratio: 9 — ABNORMAL LOW (ref 12–28)
BUN: 9 mg/dL (ref 8–27)
CO2: 22 mmol/L (ref 20–29)
Calcium: 8.6 mg/dL — ABNORMAL LOW (ref 8.7–10.3)
Chloride: 107 mmol/L — ABNORMAL HIGH (ref 96–106)
Creatinine, Ser: 0.95 mg/dL (ref 0.57–1.00)
Glucose: 90 mg/dL (ref 70–99)
Phosphorus: 3.1 mg/dL (ref 3.0–4.3)
Potassium: 4.4 mmol/L (ref 3.5–5.2)
Sodium: 143 mmol/L (ref 134–144)
eGFR: 64 mL/min/{1.73_m2} (ref >59)

## 2022-05-05 LAB — VITAMIN D 25 HYDROXY (VIT D DEFICIENCY, FRACTURES): Vit D, 25-Hydroxy: 4.6 ng/mL — ABNORMAL LOW (ref 30.0–100.0)

## 2022-05-05 LAB — CBC
Hematocrit: 35 % (ref 34.0–46.6)
Hemoglobin: 11.2 g/dL (ref 11.1–15.9)
MCH: 30.8 pg (ref 26.6–33.0)
MCHC: 32 g/dL (ref 31.5–35.7)
MCV: 96 fL (ref 79–97)
Platelets: 231 10*3/uL (ref 150–450)
RBC: 3.64 x10E6/uL — ABNORMAL LOW (ref 3.77–5.28)
RDW: 13.1 % (ref 11.7–15.4)
WBC: 4.4 10*3/uL (ref 3.4–10.8)

## 2022-05-05 LAB — VITAMIN B12: Vitamin B-12: 790 pg/mL (ref 232–1245)

## 2022-05-09 ENCOUNTER — Telehealth: Payer: Self-pay

## 2022-05-09 NOTE — Telephone Encounter (Signed)
Patient states that she missed the call from the office and is wanting someone to call her back to discuss with her about not taking any Vitamin D supplements.  Please call patient back.

## 2022-05-09 NOTE — Telephone Encounter (Signed)
Pt called for lab results. Shared provider's note. Pt states she is not taking any vitamin D supplements.

## 2022-05-10 NOTE — Telephone Encounter (Signed)
Her vitamin d levels are extremely low. She needs start taking OTC vitamin D3 2,000 units once every day.

## 2022-05-10 NOTE — Telephone Encounter (Signed)
Patient advised. Verbalized understanding 

## 2022-05-16 ENCOUNTER — Encounter: Payer: Self-pay | Admitting: Physician Assistant

## 2022-05-16 ENCOUNTER — Ambulatory Visit (INDEPENDENT_AMBULATORY_CARE_PROVIDER_SITE_OTHER): Payer: Medicare Other | Admitting: Physician Assistant

## 2022-05-16 VITALS — BP 114/78 | HR 88 | Wt 134.0 lb

## 2022-05-16 DIAGNOSIS — L304 Erythema intertrigo: Secondary | ICD-10-CM

## 2022-05-16 MED ORDER — MUPIROCIN 2 % EX OINT: 1.0000 | TOPICAL_OINTMENT | Freq: Two times a day (BID) | CUTANEOUS | 0 refills | Status: DC

## 2022-05-16 MED ORDER — NYSTATIN 100000 UNIT/GM EX POWD: 1.0000 | Freq: Three times a day (TID) | CUTANEOUS | 0 refills | Status: AC

## 2022-05-16 NOTE — Progress Notes (Signed)
I,Sha'taria Tyson,acting as a Education administrator for [employer], PA-C.,have documented all relevant documentation on the behalf of Alexandria Kirschner, PA-C,as directed by  Alexandria Kirschner, PA-C while in the presence of Alexandria Kirschner, PA-C.   Established patient visit   Patient: Alexandria Rodriguez   DOB: [birth year]   72 y.o. Female  MRN: [id number] Visit Date: 05/16/2022  Today's healthcare provider: [name], PA-C   Cc. Rash x 1 mo  Subjective    HPI  Pt reports a raw painful area on the crease of her left upper leg x 1 month. She has used baby ointment and benadryl cream with no improvement.  Denies bleeding, discharge.  Medications: Outpatient Medications Prior to Visit  Medication Sig   atorvastatin (LIPITOR) 20 MG tablet Take 1 tablet (20 mg total) by mouth daily.   carisoprodol (SOMA) 350 MG tablet TAKE 1 TABLET BY MOUTH TWICE DAILY AS NEEDED   Cholecalciferol (VITAMIN D3) 50 MCG (2000 UT) CAPS Take 2 capsules (4,000 Units total) by mouth daily.   clopidogrel (PLAVIX) 75 MG tablet Take 1 tablet (75 mg total) by mouth daily.   Cyanocobalamin (B-12 COMPLIANCE INJECTION) 1000 MCG/ML KIT Inject as directed every 30 (thirty) days.   DULoxetine (CYMBALTA) 20 MG capsule Take 1 capsule (20 mg total) by mouth daily.   ENSTILAR 0.005-0.064 % FOAM Apply  a small amount to skin as directed  qd up to 5 days per week to aa psoriasis on body, avoid face, groin, axilla   fluticasone (FLONASE) 50 MCG/ACT nasal spray Place 2 sprays into both nostrils daily.   fluticasone-salmeterol (ADVAIR) 250-50 MCG/ACT AEPB Inhale 1 puff into the lungs in the morning and at bedtime.   gabapentin (NEURONTIN) 100 MG capsule TAKE 2 CAPSULES(200 MG) BY MOUTH THREE TIMES DAILY IN ADDITION TO 600 MG GABAPENTIN TABLETS   gabapentin (NEURONTIN) 600 MG tablet TAKE 1 TABLET(600 MG) BY MOUTH TWICE DAILY   lansoprazole (PREVACID) 30 MG capsule TAKE 1 CAPSULE(30 MG) BY MOUTH DAILY   nortriptyline (PAMELOR) 50 MG capsule TAKE 1  CAPSULE(50 MG) BY MOUTH AT BEDTIME   prochlorperazine (COMPAZINE) 10 MG tablet Take 1 tablet (10 mg total) by mouth every 6 (six) hours as needed for nausea or vomiting (or headache).   triamcinolone ointment (KENALOG) 0.1 % APPLY EXTERNALLY TO THE AFFECTED AREA TWICE DAILY AS NEEDED   Vitamin D, Ergocalciferol, (DRISDOL) 1.25 MG (50000 UNIT) CAPS capsule Take 1 capsule (50,000 Units total) by mouth every 7 (seven) days.   Na Sulfate-K Sulfate-Mg Sulf 17.5-3.13-1.6 GM/177ML SOLN MIX AND DRINK AS DIRECTED (Patient not taking: Reported on 05/16/2022)   No facility-administered medications prior to visit.    Review of Systems  Constitutional:  Negative for fatigue and fever.  Respiratory:  Negative for cough and shortness of breath.   Cardiovascular:  Negative for chest pain and leg swelling.  Gastrointestinal:  Negative for abdominal pain.  Skin:  Positive for rash.  Neurological:  Negative for dizziness and headaches.      Objective    Blood pressure 114/78, pulse 88, weight 134 lb (60.8 kg), SpO2 99 %.   Physical Exam Vitals reviewed.  Constitutional:      Appearance: She is not ill-appearing.  HENT:     Head: Normocephalic.  Eyes:     Conjunctiva/sclera: Conjunctivae normal.  Cardiovascular:     Rate and Rhythm: Normal rate.  Pulmonary:     Effort: Pulmonary effort is normal. No respiratory distress.  Skin:    Comments: Left  upper thigh/pelvis with a thin skin separation and surrounding erythema. Area is not fluctuant, no discharge  Neurological:     General: No focal deficit present.     Mental Status: She is alert and oriented to person, place, and time.  Psychiatric:        Mood and Affect: Mood normal.        Behavior: Behavior normal.      No results found for any visits on 05/16/22.  Assessment & Plan     Intertrigo W/ some skin breakdown Rx topical bactroban and nystatin powder to use If no changes or worsening in the next 2 weeks to call office From the  rubbing of her pad-- recommending disposable underwear or cotton underwear with frequent changes  Return if symptoms worsen or fail to improve.      I, Alexandria Kirschner, PA-C have reviewed all documentation for this visit. The documentation on  05/16/22 for the exam, diagnosis, procedures, and orders are all accurate and complete.  Alexandria Kirschner, PA-C [facility] [address] [place], [place], [address] Office: [phone] Fax: [place]

## 2022-05-20 ENCOUNTER — Encounter: Payer: Self-pay | Admitting: Family Medicine

## 2022-05-20 DIAGNOSIS — H9319 Tinnitus, unspecified ear: Secondary | ICD-10-CM | POA: Insufficient documentation

## 2022-05-24 ENCOUNTER — Ambulatory Visit (INDEPENDENT_AMBULATORY_CARE_PROVIDER_SITE_OTHER): Payer: Medicare Other

## 2022-05-24 DIAGNOSIS — E538 Deficiency of other specified B group vitamins: Secondary | ICD-10-CM

## 2022-05-24 MED ORDER — CYANOCOBALAMIN 1000 MCG/ML IJ SOLN
1000.0000 ug | Freq: Once | INTRAMUSCULAR | Status: AC
  Administered 2022-05-24: 1000 ug via INTRAMUSCULAR

## 2022-05-25 ENCOUNTER — Inpatient Hospital Stay
Admission: EM | Admit: 2022-05-25 | Discharge: 2022-05-30 | DRG: 552 | Disposition: A | Discharge status: Skilled Nursing Facility | Payer: Medicare Other | Attending: Internal Medicine | Admitting: Internal Medicine

## 2022-05-25 ENCOUNTER — Encounter: Payer: Self-pay | Admitting: Internal Medicine

## 2022-05-25 ENCOUNTER — Emergency Department: Payer: Medicare Other

## 2022-05-25 ENCOUNTER — Other Ambulatory Visit: Payer: Self-pay

## 2022-05-25 DIAGNOSIS — Z7902 Long term (current) use of antithrombotics/antiplatelets: Secondary | ICD-10-CM

## 2022-05-25 DIAGNOSIS — W19XXXA Unspecified fall, initial encounter: Secondary | ICD-10-CM | POA: Diagnosis not present

## 2022-05-25 DIAGNOSIS — Y92009 Unspecified place in unspecified non-institutional (private) residence as the place of occurrence of the external cause: Secondary | ICD-10-CM

## 2022-05-25 DIAGNOSIS — Z8673 Personal history of transient ischemic attack (TIA), and cerebral infarction without residual deficits: Secondary | ICD-10-CM | POA: Diagnosis not present

## 2022-05-25 DIAGNOSIS — G250 Essential tremor: Secondary | ICD-10-CM | POA: Diagnosis present

## 2022-05-25 DIAGNOSIS — R296 Repeated falls: Secondary | ICD-10-CM | POA: Diagnosis not present

## 2022-05-25 DIAGNOSIS — M810 Age-related osteoporosis without current pathological fracture: Secondary | ICD-10-CM | POA: Diagnosis present

## 2022-05-25 DIAGNOSIS — G8929 Other chronic pain: Secondary | ICD-10-CM | POA: Diagnosis present

## 2022-05-25 DIAGNOSIS — Z043 Encounter for examination and observation following other accident: Secondary | ICD-10-CM | POA: Diagnosis not present

## 2022-05-25 DIAGNOSIS — S92315A Nondisplaced fracture of first metatarsal bone, left foot, initial encounter for closed fracture: Secondary | ICD-10-CM | POA: Diagnosis not present

## 2022-05-25 DIAGNOSIS — E559 Vitamin D deficiency, unspecified: Secondary | ICD-10-CM | POA: Diagnosis not present

## 2022-05-25 DIAGNOSIS — Z7189 Other specified counseling: Secondary | ICD-10-CM | POA: Diagnosis not present

## 2022-05-25 DIAGNOSIS — R933 Abnormal findings on diagnostic imaging of other parts of digestive tract: Secondary | ICD-10-CM | POA: Diagnosis present

## 2022-05-25 DIAGNOSIS — K219 Gastro-esophageal reflux disease without esophagitis: Secondary | ICD-10-CM | POA: Diagnosis present

## 2022-05-25 DIAGNOSIS — Z79899 Other long term (current) drug therapy: Secondary | ICD-10-CM | POA: Diagnosis not present

## 2022-05-25 DIAGNOSIS — Z888 Allergy status to other drugs, medicaments and biological substances status: Secondary | ICD-10-CM

## 2022-05-25 DIAGNOSIS — Z515 Encounter for palliative care: Secondary | ICD-10-CM | POA: Diagnosis not present

## 2022-05-25 DIAGNOSIS — Z7409 Other reduced mobility: Secondary | ICD-10-CM | POA: Diagnosis present

## 2022-05-25 DIAGNOSIS — J449 Chronic obstructive pulmonary disease, unspecified: Secondary | ICD-10-CM | POA: Diagnosis not present

## 2022-05-25 DIAGNOSIS — S199XXA Unspecified injury of neck, initial encounter: Secondary | ICD-10-CM | POA: Diagnosis not present

## 2022-05-25 DIAGNOSIS — R531 Weakness: Secondary | ICD-10-CM | POA: Diagnosis not present

## 2022-05-25 DIAGNOSIS — K5730 Diverticulosis of large intestine without perforation or abscess without bleeding: Secondary | ICD-10-CM | POA: Diagnosis not present

## 2022-05-25 DIAGNOSIS — G20A1 Parkinson's disease without dyskinesia, without mention of fluctuations: Secondary | ICD-10-CM | POA: Diagnosis not present

## 2022-05-25 DIAGNOSIS — S32010A Wedge compression fracture of first lumbar vertebra, initial encounter for closed fracture: Secondary | ICD-10-CM | POA: Diagnosis present

## 2022-05-25 DIAGNOSIS — S92309A Fracture of unspecified metatarsal bone(s), unspecified foot, initial encounter for closed fracture: Secondary | ICD-10-CM | POA: Diagnosis present

## 2022-05-25 DIAGNOSIS — R0689 Other abnormalities of breathing: Secondary | ICD-10-CM | POA: Diagnosis not present

## 2022-05-25 DIAGNOSIS — F17210 Nicotine dependence, cigarettes, uncomplicated: Secondary | ICD-10-CM | POA: Diagnosis not present

## 2022-05-25 DIAGNOSIS — Z823 Family history of stroke: Secondary | ICD-10-CM

## 2022-05-25 DIAGNOSIS — M79672 Pain in left foot: Secondary | ICD-10-CM | POA: Diagnosis not present

## 2022-05-25 DIAGNOSIS — F32A Depression, unspecified: Secondary | ICD-10-CM | POA: Diagnosis present

## 2022-05-25 DIAGNOSIS — Z885 Allergy status to narcotic agent status: Secondary | ICD-10-CM

## 2022-05-25 DIAGNOSIS — S32019A Unspecified fracture of first lumbar vertebra, initial encounter for closed fracture: Secondary | ICD-10-CM | POA: Diagnosis not present

## 2022-05-25 DIAGNOSIS — N179 Acute kidney failure, unspecified: Secondary | ICD-10-CM | POA: Diagnosis present

## 2022-05-25 DIAGNOSIS — E785 Hyperlipidemia, unspecified: Secondary | ICD-10-CM | POA: Diagnosis present

## 2022-05-25 DIAGNOSIS — Z882 Allergy status to sulfonamides status: Secondary | ICD-10-CM | POA: Diagnosis not present

## 2022-05-25 DIAGNOSIS — M545 Low back pain, unspecified: Secondary | ICD-10-CM | POA: Diagnosis not present

## 2022-05-25 DIAGNOSIS — Z720 Tobacco use: Secondary | ICD-10-CM | POA: Diagnosis present

## 2022-05-25 DIAGNOSIS — S92355A Nondisplaced fracture of fifth metatarsal bone, left foot, initial encounter for closed fracture: Secondary | ICD-10-CM | POA: Diagnosis not present

## 2022-05-25 DIAGNOSIS — G7000 Myasthenia gravis without (acute) exacerbation: Secondary | ICD-10-CM | POA: Diagnosis present

## 2022-05-25 DIAGNOSIS — W010XXA Fall on same level from slipping, tripping and stumbling without subsequent striking against object, initial encounter: Secondary | ICD-10-CM | POA: Diagnosis present

## 2022-05-25 DIAGNOSIS — F419 Anxiety disorder, unspecified: Secondary | ICD-10-CM | POA: Diagnosis present

## 2022-05-25 DIAGNOSIS — M79673 Pain in unspecified foot: Secondary | ICD-10-CM | POA: Diagnosis not present

## 2022-05-25 DIAGNOSIS — R6889 Other general symptoms and signs: Secondary | ICD-10-CM | POA: Diagnosis not present

## 2022-05-25 DIAGNOSIS — S0990XA Unspecified injury of head, initial encounter: Secondary | ICD-10-CM | POA: Diagnosis not present

## 2022-05-25 LAB — CBC WITH DIFFERENTIAL/PLATELET
Abs Immature Granulocytes: 0.04 10*3/uL (ref 0.00–0.07)
Basophils Absolute: 0.1 10*3/uL (ref 0.0–0.1)
Basophils Relative: 1 %
Eosinophils Absolute: 0.4 10*3/uL (ref 0.0–0.5)
Eosinophils Relative: 5 %
HCT: 36.8 % (ref 36.0–46.0)
Hemoglobin: 12 g/dL (ref 12.0–15.0)
Immature Granulocytes: 1 %
Lymphocytes Relative: 14 %
Lymphs Abs: 1.1 10*3/uL (ref 0.7–4.0)
MCH: 31.7 pg (ref 26.0–34.0)
MCHC: 32.6 g/dL (ref 30.0–36.0)
MCV: 97.4 fL (ref 80.0–100.0)
Monocytes Absolute: 0.6 10*3/uL (ref 0.1–1.0)
Monocytes Relative: 7 %
Neutro Abs: 5.8 10*3/uL (ref 1.7–7.7)
Neutrophils Relative %: 72 %
Platelets: 153 10*3/uL (ref 150–400)
RBC: 3.78 MIL/uL — ABNORMAL LOW (ref 3.87–5.11)
RDW: 13.7 % (ref 11.5–15.5)
WBC: 8 10*3/uL (ref 4.0–10.5)
nRBC: 0 % (ref 0.0–0.2)

## 2022-05-25 LAB — BASIC METABOLIC PANEL
Anion gap: 10 (ref 5–15)
BUN: 9 mg/dL (ref 8–23)
CO2: 24 mmol/L (ref 22–32)
Calcium: 8.3 mg/dL — ABNORMAL LOW (ref 8.9–10.3)
Chloride: 101 mmol/L (ref 98–111)
Creatinine, Ser: 1.13 mg/dL — ABNORMAL HIGH (ref 0.44–1.00)
GFR, Estimated: 52 mL/min — ABNORMAL LOW (ref >60)
Glucose, Bld: 91 mg/dL (ref 70–99)
Potassium: 3.6 mmol/L (ref 3.5–5.1)
Sodium: 135 mmol/L (ref 135–145)

## 2022-05-25 MED ORDER — FENTANYL CITRATE PF 50 MCG/ML IJ SOSY
25.0000 ug | PREFILLED_SYRINGE | INTRAMUSCULAR | Status: AC | PRN
  Administered 2022-05-25 – 2022-05-26 (×3): 25 ug via INTRAVENOUS
  Filled 2022-05-25 (×3): qty 1

## 2022-05-25 MED ORDER — SODIUM CHLORIDE 0.9 % IV SOLN: INTRAVENOUS | Status: DC

## 2022-05-25 MED ORDER — SODIUM CHLORIDE 0.9% FLUSH
3.0000 mL | Freq: Two times a day (BID) | INTRAVENOUS | Status: DC
  Administered 2022-05-25 – 2022-05-30 (×8): 3 mL via INTRAVENOUS

## 2022-05-25 MED ORDER — NICOTINE 14 MG/24HR TD PT24
14.0000 mg | MEDICATED_PATCH | Freq: Every day | TRANSDERMAL | Status: DC
  Administered 2022-05-25 – 2022-05-26 (×2): 14 mg via TRANSDERMAL
  Filled 2022-05-25 (×3): qty 1

## 2022-05-25 MED ORDER — HEPARIN SODIUM (PORCINE) 5000 UNIT/ML IJ SOLN
5000.0000 [IU] | Freq: Two times a day (BID) | INTRAMUSCULAR | Status: AC
  Administered 2022-05-25 – 2022-05-28 (×6): 5000 [IU] via SUBCUTANEOUS
  Filled 2022-05-25 (×6): qty 1

## 2022-05-25 MED ORDER — IBUPROFEN 600 MG PO TABS
600.0000 mg | ORAL_TABLET | Freq: Once | ORAL | Status: AC
  Administered 2022-05-25: 600 mg via ORAL
  Filled 2022-05-25: qty 1

## 2022-05-25 MED ORDER — ACETAMINOPHEN 325 MG PO TABS
650.0000 mg | ORAL_TABLET | Freq: Once | ORAL | Status: AC
  Administered 2022-05-25: 650 mg via ORAL
  Filled 2022-05-25: qty 2

## 2022-05-25 NOTE — ED Provider Notes (Incomplete)
[facility] Provider Note    Event Date/Time   First MD Initiated Contact with Patient 05/25/22 1816     (approximate)   History   Fall   HPI  Alexandria Rodriguez is a 72 y.o. female   Past medical history of tremor, neuropathy, gait instability, stroke, medical chart problem list includes myasthenia gravis but I reviewed [facility] neurology and [facility] neurology notes from the past several years that make no mention of myasthenia gravis and instead functional neurologic disorder/atypical parkinsonism and gait instability neuropathy only as the neurologic diagnosis-who presents to the emergency department with a mechanical fall today.  She states she was walking without her walker to the garbage can and threw something out turned around and slipped over her own feet and fell onto her side.  Denies head strike or loss of consciousness she does not take blood thinners.  She hurt her left foot and her lower back.  No significant downtime.  Husband helped her up.  No preceding illnesses, is otherwise been in her regular state of health.  No other acute medical complaints   External Medical Documents Reviewed: [facility] neurology and [facility] neurology notes from 2019 and 2022      Physical Exam   Triage Vital Signs: ED Triage Vitals  Enc Vitals Group     BP      Pulse      Resp      Temp      Temp src      SpO2      Weight      Height      Head Circumference      Peak Flow      Pain Score      Pain Loc      Pain Edu?      Excl. in [place]?     Most recent vital signs: There were no vitals filed for this visit.  General: Awake, no distress. *** CV:  Good peripheral perfusion. *** Resp:  Normal effort. *** Abd:  No distention. *** Other:  ***   ED Results / Procedures / Treatments   Labs (all labs ordered are listed, but only abnormal results are displayed) Labs Reviewed - No data to display   I ordered and reviewed the above labs they are notable  for ***  EKG  ED ECG REPORT I, Lucillie Garfinkel, the attending physician, personally viewed and interpreted this ECG.   Date: 05/25/2022  EKG Time: ***  Rate: ***  Rhythm: {ekg findings:315101}  Axis: ***  Intervals:{conduction defects:17367}  ST&T Change: ***    RADIOLOGY I independently reviewed and interpreted ***   PROCEDURES:  Critical Care performed: {CriticalCareYesNo:19197::"Yes, see critical care procedure note(s)","No"}  Procedures   MEDICATIONS ORDERED IN ED: Medications - No data to display  External physician / consultants:  I spoke with *** regarding care plan for this patient.   IMPRESSION / MDM / ASSESSMENT AND PLAN / ED COURSE  I reviewed the triage vital signs and the nursing notes.                                Patient's presentation is most consistent with {EM COPA:27473}  Differential diagnosis includes, but is not limited to, ***   ***The patient is on the cardiac monitor to evaluate for evidence of arrhythmia and/or significant heart rate changes.  MDM:  ***  I considered hospitalization for admission  or observation ***        FINAL CLINICAL IMPRESSION(S) / ED DIAGNOSES   Final diagnoses:  None     Rx / DC Orders   ED Discharge Orders     None        Note:  This document was prepared using Dragon voice recognition software and may include unintentional dictation errors.

## 2022-05-25 NOTE — H&P (Signed)
History and Physical    Chief Complaint: Fall.    HISTORY OF PRESENT ILLNESS: Alexandria Rodriguez is an 72 y.o. female  seen in ed today for fall on right side with her left foot underneath. Since fall has LBP and left foot pain.no dizziness loc or syncope per history. No bleeding or n/v/d/. No blood thinners. ROM is limited due to pain.  Pt has h/o MG and or ? Parkinsonism not sure.  She states she was walking without her walker to the garbage can and threw something out turned  around and slipped over her own feet and fell onto her side.  Patient is being followed by neurology for  baseline tremor and history of Parkinson's or myasthenia gravis. Pt sees neurology dr Manuella Ghazi .   Pt has  Past Medical History:  Diagnosis Date   Anxiety    Cataract    Cerebrovascular disease    Chronic pain    back pain   Contact dermatitis    COPD (chronic obstructive pulmonary disease) ([place])    Depression    Diverticulosis 10/31/2010   On Tranverse colon   GERD (gastroesophageal reflux disease)    Humerus fracture 04/10/2016   Hyperlipidemia    Myasthenia gravis ([place]) 02/2013   Diagnosed by doctor Manuella Ghazi   Neuropathy    Osteoporosis    Stroke [facility])    Vitamin D deficiency      Review of Systems  Musculoskeletal:  Positive for back pain and gait problem.       Fall.   All other systems reviewed and are negative.  Allergies  Allergen Reactions   Effexor Xr [Venlafaxine Hcl Er] Other (See Comments)    Reaction:  GI upset    Fosamax [Alendronate Sodium] Nausea And Vomiting   Percocet [Oxycodone-Acetaminophen] Swelling   Erythromycin Swelling and Rash    Pt states that her tongue swells.     Lidoderm [Lidocaine] Swelling and Rash   Morphine And Related Swelling, Rash and Other (See Comments)    Pt states that her tongue swells.   Pt states that her tongue swells.     Sulfa Antibiotics Swelling and Rash    TONGUE SWELLING   Sulfasalazine Rash and Swelling   Tramadol Swelling and Rash    Past Surgical History:  Procedure Laterality Date   ABDOMINAL HYSTERECTOMY  2000   due to excessive bleeeding   CERVICAL CONE BIOPSY     Cervical discectomy and fusion  [facility]   COLONOSCOPY WITH PROPOFOL N/A 12/06/2015   Procedure: COLONOSCOPY WITH PROPOFOL;  Surgeon: Manya Silvas, MD;  Location: [facility] ENDOSCOPY;  Service: Endoscopy;  Laterality: N/A;   ESOPHAGOGASTRODUODENOSCOPY N/A 10/05/2014   Procedure: ESOPHAGOGASTRODUODENOSCOPY (EGD);  Surgeon: Manya Silvas, MD;  Location: [facility] ENDOSCOPY;  Service: Endoscopy;  Laterality: N/A;   Repeat cuff surgery  2004   Developed adhesive capsulitis   ROTATOR CUFF REPAIR Right 2003   Shoulder       MEDICATIONS: Current Outpatient Medications  Medication Instructions   atorvastatin (LIPITOR) 20 mg, Oral, Daily   carisoprodol (SOMA) 350 MG tablet TAKE 1 TABLET BY MOUTH TWICE DAILY AS NEEDED   clopidogrel (PLAVIX) 75 mg, Oral, Daily   DULoxetine (CYMBALTA) 20 mg, Oral, Daily   ENSTILAR 0.005-0.064 % FOAM Apply  a small amount to skin as directed  qd up to 5 days per week to aa psoriasis on body, avoid face, groin, axilla   fluticasone (FLONASE) 50 MCG/ACT nasal spray 2  sprays, Each Nare, Daily   fluticasone-salmeterol (ADVAIR) 250-50 MCG/ACT AEPB 1 puff, Inhalation, 2 times daily   gabapentin (NEURONTIN) 100 MG capsule TAKE 2 CAPSULES(200 MG) BY MOUTH THREE TIMES DAILY IN ADDITION TO 600 MG GABAPENTIN TABLETS   gabapentin (NEURONTIN) 600 MG tablet TAKE 1 TABLET(600 MG) BY MOUTH TWICE DAILY   lansoprazole (PREVACID) 30 MG capsule TAKE 1 CAPSULE(30 MG) BY MOUTH DAILY   mupirocin ointment (BACTROBAN) 2 % 1 Application, Topical, 2 times daily   Na Sulfate-K Sulfate-Mg Sulf 17.5-3.13-1.6 GM/177ML SOLN MIX AND DRINK AS DIRECTED   nortriptyline (PAMELOR) 50 MG capsule TAKE 1 CAPSULE(50 MG) BY MOUTH AT BEDTIME   nystatin (MYCOSTATIN/NYSTOP) powder 1 Application, Topical, 3 times daily   prochlorperazine  (COMPAZINE) 10 mg, Oral, Every 6 hours PRN   triamcinolone ointment (KENALOG) 0.1 % APPLY EXTERNALLY TO THE AFFECTED AREA TWICE DAILY AS NEEDED   Vitamin D (Ergocalciferol) (DRISDOL) 50,000 Units, Oral, Every 7 days   vitamin D3 4,000 Units, Oral, Daily    ED Course: Pt in Ed pressure is elevated at 160/92. CMP shows creatinine of 1.93 which is mildly abnormal from previous month, GFR 52. Vitals:   05/25/22 1830 05/25/22 1831  BP: (!) 160/92   Pulse: 81   Resp: 12   Temp: 97.9 F (36.6 C)   TempSrc: Oral   SpO2: 96%   Weight:  63 kg  Height:  5' 2"$  (1.575 m)  EDMD d/w Dr Izora Ribas about her case and recommended TLSO brace.   No intake/output data recorded. SpO2: 96 % Blood work in ed shows: CBC shows normal white count hemoglobin and platelets. CT cervical spine shows no fracture or subluxation, degeneration present at C6-C7 level, multifocal areas of scarring or infiltrate, heterogeneous material within the trachea at the thoracic inlet which may be concerning for aspiration. Head CT noncontrast done today was negative for any acute intracranial abnormalities patient does have a left frontal lobe infarct but is chronic mild right maxillary sinus disease. Lumbar spine shows cortical irregularities and depression in the anterior superior endplate of L1 suggesting a nondisplaced acute fracture but degenerative changes in the lower spine. CT pelvis shows no acute fracture or dislocation and sigmoid colon diverticulosis.  Results for orders placed or performed during the hospital encounter of 05/25/22 (from the past 24 hour(s))  Basic metabolic panel     Status: Abnormal   Collection Time: 05/25/22  7:47 PM  Result Value Ref Range   Sodium 135 135 - 145 mmol/L   Potassium 3.6 3.5 - 5.1 mmol/L   Chloride 101 98 - 111 mmol/L   CO2 24 22 - 32 mmol/L   Glucose, Bld 91 70 - 99 mg/dL   BUN 9 8 - 23 mg/dL   Creatinine, Ser 1.13 (H) 0.44 - 1.00 mg/dL   Calcium 8.3 (L) 8.9 - 10.3 mg/dL    GFR, Estimated 52 (L) >60 mL/min   Anion gap 10 5 - 15  CBC with Differential     Status: Abnormal   Collection Time: 05/25/22  7:47 PM  Result Value Ref Range   WBC 8.0 4.0 - 10.5 K/uL   RBC 3.78 (L) 3.87 - 5.11 MIL/uL   Hemoglobin 12.0 12.0 - 15.0 g/dL   HCT 36.8 36.0 - 46.0 %   MCV 97.4 80.0 - 100.0 fL   MCH 31.7 26.0 - 34.0 pg   MCHC 32.6 30.0 - 36.0 g/dL   RDW 13.7 11.5 - 15.5 %   Platelets 153 150 - 400  K/uL   nRBC 0.0 0.0 - 0.2 %   Neutrophils Relative % 72 %   Neutro Abs 5.8 1.7 - 7.7 K/uL   Lymphocytes Relative 14 %   Lymphs Abs 1.1 0.7 - 4.0 K/uL   Monocytes Relative 7 %   Monocytes Absolute 0.6 0.1 - 1.0 K/uL   Eosinophils Relative 5 %   Eosinophils Absolute 0.4 0.0 - 0.5 K/uL   Basophils Relative 1 %   Basophils Absolute 0.1 0.0 - 0.1 K/uL   Immature Granulocytes 1 %   Abs Immature Granulocytes 0.04 0.00 - 0.07 K/uL    Unresulted Labs (From admission, onward)     Start     Ordered   05/26/22 0500  Comprehensive metabolic panel  Tomorrow morning,   STAT        05/25/22 2152   05/26/22 0500  CBC  Tomorrow morning,   STAT        05/25/22 2152           Pt has received : Orders Placed This Encounter  Procedures   CT Head Wo Contrast    Standing Status:   Standing    Number of Occurrences:   1   CT Cervical Spine Wo Contrast    Standing Status:   Standing    Number of Occurrences:   1   CT PELVIS WO CONTRAST    Standing Status:   Standing    Number of Occurrences:   1    Order Specific Question:   Is Oral Contrast requested for this exam?    Answer:   No oral contrast    Order Specific Question:   Reason for No Oral Contrast    Answer:   Trauma   CT Lumbar Spine Wo Contrast    Standing Status:   Standing    Number of Occurrences:   1   DG Foot Complete Left    Standing Status:   Standing    Number of Occurrences:   1    Order Specific Question:   Reason for Exam (SYMPTOM  OR DIAGNOSIS REQUIRED)    Answer:   1st metatarsal pain from fall    Basic metabolic panel    Standing Status:   Standing    Number of Occurrences:   1   CBC with Differential    Standing Status:   Standing    Number of Occurrences:   1   Comprehensive metabolic panel    Standing Status:   Standing    Number of Occurrences:   1   CBC    Standing Status:   Standing    Number of Occurrences:   1   Diet NPO time specified    Standing Status:   Standing    Number of Occurrences:   1   Apply TLSO brace    Standing Status:   Standing    Number of Occurrences:   1    Order Specific Question:   may remove brace to shower    Answer:   Yes   Maintain TLSO brace    Standing Status:   Standing    Number of Occurrences:   1    Order Specific Question:   may remove brace to shower    Answer:   Yes   Maintain IV access    Standing Status:   Standing    Number of Occurrences:   1   Vital signs    Standing Status:   Standing  Number of Occurrences:   1   Notify physician (specify)    Standing Status:   Standing    Number of Occurrences:   20    Order Specific Question:   Notify Physician    Answer:   for pulse less than 55 or greater than 120    Order Specific Question:   Notify Physician    Answer:   for respiratory rate less than 12 or greater than 25    Order Specific Question:   Notify Physician    Answer:   for temperature greater than 100.5 F    Order Specific Question:   Notify Physician    Answer:   for urinary output less than 30 mL/hr for four hours    Order Specific Question:   Notify Physician    Answer:   for systolic BP less than 90 or greater than 0000000, diastolic BP less than 60 or greater than 100    Order Specific Question:   Notify Physician    Answer:   for new hypoxia w/ oxygen saturations < 88%   Progressive Mobility Protocol: No Restrictions    Standing Status:   Standing    Number of Occurrences:   1   Daily weights    Standing Status:   Standing    Number of Occurrences:   1   Intake and Output    Standing Status:    Standing    Number of Occurrences:   1   Initiate Oral Care Protocol    Standing Status:   Standing    Number of Occurrences:   1   Initiate Carrier Fluid Protocol    Standing Status:   Standing    Number of Occurrences:   1   RN may order General Admission PRN Orders utilizing "General Admission PRN medications" (through manage orders) for the following patient needs: allergy symptoms (Claritin), cold sores (Carmex), cough (Robitussin DM), eye irritation (Liquifilm Tears), hemorrhoids (Tucks), indigestion (Maalox), minor skin irritation (Hydrocortisone Cream), muscle pain (Ben Gay), nose irritation (saline nasal spray) and sore throat (Chloraseptic spray).    Standing Status:   Standing    Number of Occurrences:   [id number]   Swallow screen    Standing Status:   Standing    Number of Occurrences:   1   Full code    Standing Status:   Standing    Number of Occurrences:   1    Order Specific Question:   By:    Answer:   Other   Consult to hospitalist    Standing Status:   Standing    Number of Occurrences:   1    Order Specific Question:   Place call to:    Answer:   [phone]    Order Specific Question:   Reason for Consult    Answer:   Admit   Pulse oximetry check with vital signs    Standing Status:   Standing    Number of Occurrences:   1   Oxygen therapy Mode or (Route): Nasal cannula; Liters Per Minute: 2; Keep 02 saturation: greater than 92 %    Standing Status:   Standing    Number of Occurrences:   20    Order Specific Question:   Mode or (Route)    Answer:   Nasal cannula    Order Specific Question:   Liters Per Minute    Answer:   2    Order Specific Question:   Keep  02 saturation    Answer:   greater than 92 %   Place in observation (patient's expected length of stay will be less than 2 midnights)    Standing Status:   Standing    Number of Occurrences:   1    Order Specific Question:   Hospital Area    Answer:   [place] [[id number]]    Order  Specific Question:   Level of Care    Answer:   Med-Surg [16]    Order Specific Question:   Covid Evaluation    Answer:   Asymptomatic - no recent exposure (last 10 days) testing not required    Order Specific Question:   Diagnosis    Answer:   Fall [id number]    Order Specific Question:   Admitting Physician    Answer:   Cherylann Ratel    Order Specific Question:   Attending Physician    Answer:   Cherylann Ratel   Aspiration precautions    Standing Status:   Standing    Number of Occurrences:   1   Fall precautions    Standing Status:   Standing    Number of Occurrences:   1    Meds ordered this encounter  Medications   ibuprofen (ADVIL) tablet 600 mg   acetaminophen (TYLENOL) tablet 650 mg   heparin injection 5,000 Units   sodium chloride flush (NS) 0.9 % injection 3 mL   0.9 %  sodium chloride infusion   nicotine (NICODERM CQ - dosed in mg/24 hours) patch 14 mg   fentaNYL (SUBLIMAZE) injection 25 mcg     Admission Imaging : CT Cervical Spine Wo Contrast  Result Date: 05/25/2022 CLINICAL DATA:  Status post fall. EXAM: CT CERVICAL SPINE WITHOUT CONTRAST TECHNIQUE: Multidetector CT imaging of the cervical spine was performed without intravenous contrast. Multiplanar CT image reconstructions were also generated. RADIATION DOSE REDUCTION: This exam was performed according to the departmental dose-optimization program which includes automated exposure control, adjustment of the mA and/or kV according to patient size and/or use of iterative reconstruction technique. COMPARISON:  None Available. FINDINGS: Alignment: Normal. Skull base and vertebrae: No acute fracture. No primary bone lesion or focal pathologic process. Soft tissues and spinal canal: No prevertebral fluid or swelling. No visible canal hematoma. Disc levels: Mild anterior osteophyte formation is seen at the level of C5-C6 with mild multilevel endplate sclerosis is seen throughout the remainder of the cervical  spine. There is marked severity narrowing of the anterior atlantoaxial articulation. Fusion of the C6-C7 intervertebral disc space is noted. Mild posterior intervertebral disc space narrowing is seen throughout the remainder of the cervical spine. Bilateral mild-to-moderate severity multilevel facet joint hypertrophy is noted. Upper chest: Multifocal areas of biapical scarring, atelectasis and/or infiltrate are seen. Other: A mild amount of heterogeneous material is seen within the trachea near the level of the thoracic inlet. IMPRESSION: 1. No acute fracture or subluxation in the cervical spine. 2. Mild degenerative changes with fusion of the C6-C7 intervertebral disc space. 3. Multifocal areas of biapical scarring, atelectasis and/or infiltrate. 4. Mild amount of heterogeneous material within the trachea near the level of the thoracic inlet, which may represent aspirated material. Electronically Signed   By: Virgina Norfolk M.D.   On: 05/25/2022 19:45   CT PELVIS WO CONTRAST  Result Date: 05/25/2022 CLINICAL DATA:  Fall EXAM: CT PELVIS WITHOUT CONTRAST TECHNIQUE: Multidetector CT imaging of the pelvis was performed following the standard protocol without  intravenous contrast. RADIATION DOSE REDUCTION: This exam was performed according to the departmental dose-optimization program which includes automated exposure control, adjustment of the mA and/or kV according to patient size and/or use of iterative reconstruction technique. COMPARISON:  CT abdomen and pelvis 02/01/2015 FINDINGS: Urinary Tract:  No abnormality visualized. Bowel: There is sigmoid colon diverticulosis. Appendix is within normal limits. Vascular/Lymphatic: There are atherosclerotic calcifications of the aorta and coronary arteries. No enlarged lymph nodes are seen. Reproductive:  Uterus is not seen. Other:  No focal body wall hematoma.  No ascites. Musculoskeletal: No acute fracture.  Bones are diffusely osteopenic. IMPRESSION: 1. No acute  fracture or dislocation of the pelvis. 2. Sigmoid colon diverticulosis. Aortic Atherosclerosis (ICD10-I70.0). Electronically Signed   By: Ronney Asters M.D.   On: 05/25/2022 19:36   DG Foot Complete Left  Result Date: 05/25/2022 CLINICAL DATA:  First metatarsal pain after fall EXAM: LEFT FOOT - COMPLETE 3 VIEW COMPARISON:  None Available. FINDINGS: Severe osteopenia. Preserved joint spaces. On the oblique view there is cortical irregularity along the base of the first metatarsal which is not as well seen on other views but there is some distortion of trabeculae in this location on the standard view. Possible fracture. IMPRESSION: 1. Cortical irregularity on one view at the base of the first metatarsal. Worrisome for a subtle fracture. Correlate for point tenderness. 2. Severe osteopenia. Electronically Signed   By: Jill Side M.D.   On: 05/25/2022 19:35   CT Head Wo Contrast  Result Date: 05/25/2022 CLINICAL DATA:  Status post fall. EXAM: CT HEAD WITHOUT CONTRAST TECHNIQUE: Contiguous axial images were obtained from the base of the skull through the vertex without intravenous contrast. RADIATION DOSE REDUCTION: This exam was performed according to the departmental dose-optimization program which includes automated exposure control, adjustment of the mA and/or kV according to patient size and/or use of iterative reconstruction technique. COMPARISON:  November 16, 2020 FINDINGS: Brain: There is mild cerebral atrophy with widening of the extra-axial spaces and ventricular dilatation. There are areas of decreased attenuation within the white matter tracts of the supratentorial brain, consistent with microvascular disease changes. A chronic left frontal lobe infarct is seen. Vascular: There is moderate severity bilateral cavernous carotid artery calcification. Skull: Normal. Negative for fracture or focal lesion. Sinuses/Orbits: There is mild anterior right maxillary sinus mucosal thickening. Other: None.  IMPRESSION: 1. No acute intracranial abnormality. 2. Chronic left frontal lobe infarct. 3. Mild right maxillary sinus disease. Electronically Signed   By: Virgina Norfolk M.D.   On: 05/25/2022 19:33   CT Lumbar Spine Wo Contrast  Result Date: 05/25/2022 CLINICAL DATA:  Back trauma with no prior imaging. Mechanical fall prior to arrival. Severe low back pain. EXAM: CT LUMBAR SPINE WITHOUT CONTRAST TECHNIQUE: Multidetector CT imaging of the lumbar spine was performed without intravenous contrast administration. Multiplanar CT image reconstructions were also generated. RADIATION DOSE REDUCTION: This exam was performed according to the departmental dose-optimization program which includes automated exposure control, adjustment of the mA and/or kV according to patient size and/or use of iterative reconstruction technique. COMPARISON:  Lumbar spine radiographs 07/23/2014 FINDINGS: Segmentation: 5 lumbar type vertebral bodies. Alignment: Normal alignment. Vertebrae: There is minimal superior anterior endplate compression with cortical irregularity and linear lucency demonstrated at L1 suggesting acute nondisplaced fracture. No evidence of posterior extension. No other vertebral compression deformities are demonstrated. No destructive bone lesions. Paraspinal and other soft tissues: No abnormal paraspinal mass or soft tissue infiltration. Incidental note of aortoiliac vascular calcifications and sigmoid colonic  diverticula without evidence of acute diverticulitis. Disc levels: Degenerative disc disease at L4-5 and L5-S1 levels, similar to prior study. IMPRESSION: Cortical irregularity and depression of the anterior superior endplate of L1 suggesting acute nondisplaced fracture. No posterior involvement. Degenerative changes in the lower lumbar spine. Electronically Signed   By: Lucienne Capers M.D.   On: 05/25/2022 19:33      Physical Examination: Vitals:   05/25/22 1830 05/25/22 1831  BP: (!) 160/92    Pulse: 81   Temp: 97.9 F (36.6 C)   Resp: 12   Height:  5' 2"$  (1.575 m)  Weight:  63 kg  SpO2: 96%   TempSrc: Oral   BMI (Calculated):  25.42   Physical Exam Vitals and nursing note reviewed.  Constitutional:      General: She is not in acute distress.    Appearance: Normal appearance. She is not ill-appearing, toxic-appearing or diaphoretic.  HENT:     Head: Normocephalic and atraumatic.     Right Ear: Hearing and external ear normal.     Left Ear: Hearing and external ear normal.     Nose: Nose normal. No nasal deformity.     Mouth/Throat:     Lips: Pink.     Mouth: Mucous membranes are moist.     Tongue: No lesions.     Pharynx: Oropharynx is clear.  Eyes:     Extraocular Movements: Extraocular movements intact.     Pupils: Pupils are equal, round, and reactive to light.  Neck:     Vascular: No carotid bruit.  Cardiovascular:     Rate and Rhythm: Normal rate and regular rhythm.     Pulses: Normal pulses.     Heart sounds: Normal heart sounds.  Pulmonary:     Effort: Pulmonary effort is normal.     Breath sounds: Normal breath sounds.  Abdominal:     General: Bowel sounds are normal. There is no distension.     Palpations: Abdomen is soft. There is no mass.     Tenderness: There is no abdominal tenderness. There is no guarding.     Hernia: No hernia is present.  Musculoskeletal:     Right lower leg: No edema.     Left lower leg: No edema.  Skin:    General: Skin is warm.  Neurological:     General: No focal deficit present.     Mental Status: She is alert and oriented to person, place, and time.     Cranial Nerves: Cranial nerves 2-12 are intact.     Motor: Motor function is intact.  Psychiatric:        Attention and Perception: Attention normal.        Mood and Affect: Mood normal.        Speech: Speech normal.        Behavior: Behavior normal. Behavior is cooperative.        Cognition and Memory: Cognition normal.       Assessment and Plan: *  Recurrent falls 2/2 to her neurological condition/ MG or parkinsonisms. Fall precaution and PT consult. Correct vit d deficiency.    Fall at home, initial encounter Gait strengthening. Modification of home and lifestyle to further prevent any falls.    Vitamin D deficiency Replacement of at least 5000 I.U vit d 3 daily.    Metatarsal fracture From fall . Pain control and boot if needed. Podiatry or ortho as deemed appropriate.   L1 vertebral fracture (HCC) TLSO brace to avoid  instability of spine.   Essential tremor We will get tft. Get esr and crp.    Myasthenia gravis ([place]) Pt is on soma / Cymbalta / Neurontin/ nortriptyline and compazine. Not sure how she is taking theres meds po if she is high risk for aspiration and is still aspirating.    COPD (chronic obstructive pulmonary disease) (HCC) Stable. Tobacco cessation. PRN albuterol as deemed appropriate.  Scheduled Advair.   Tobacco use Nicotine patch andcounselling.    AKI (acute kidney injury) [facility]) Lab Results  Component Value Date   CREATININE 1.13 (H) 05/25/2022   CREATININE 0.95 05/04/2022   CREATININE 1.13 (H) 08/30/2021  Start NS at 75 cc/hour x 1 days.    GERD (gastroesophageal reflux disease) IV PPI.  Abnormal barium swallow On ct imaging today there is mention of material aspirate in trachea which is consist with aspiration . NPOuntil repeat swallow..    DVT prophylaxis:  Heparin  q 12h.   Code Status:  Full Code.   Family Communication:  47 W (Spouse)  [phone]    Disposition Plan:  TBD.   Consults called:  None.  Admission status: Observation.   Unit/ Expected LOS: Med Surg / 2 days.    Para Skeans MD [facility]  6 PM- 2 AM. Please contact me via secure Chat 6 PM-2 AM. [phone]( Pager ) To contact the [facility] Attending or Consulting provider [place] or covering provider during after hours [place], for this patient.   Check the care team in  [facility] [place] and look for a) attending/consulting [facility] provider listed and b) the [facility] team listed Log into [email or web address] and use [place]'s universal password to access. If you do not have the password, please contact the hospital operator. Locate the [facility] provider you are looking for under [facility] and page to a number that you can be directly reached. If you still have difficulty reaching the provider, please page the [facility] (Director on Call) for the Hospitalists listed on amion for assistance. [email or web address] 05/25/2022, 10:13 PM

## 2022-05-25 NOTE — Assessment & Plan Note (Addendum)
Counseling done by admitting hospitalist

## 2022-05-25 NOTE — Assessment & Plan Note (Signed)
Pt is on soma / Cymbalta / Neurontin/ nortriptyline and compazine. Not sure how she is taking theres meds po if she is high risk for aspiration and is still aspirating.

## 2022-05-25 NOTE — ED Provider Notes (Signed)
[facility] Provider Note    Event Date/Time   First MD Initiated Contact with Patient 05/25/22 1816     (approximate)   History   Fall   HPI  Alexandria Rodriguez is a 72 y.o. female   Past medical history of tremor, neuropathy, gait instability, stroke, medical chart problem list includes myasthenia gravis but I reviewed [facility] neurology and [facility] neurology notes from the past several years that make no mention of myasthenia gravis and instead functional neurologic disorder/atypical parkinsonism and gait instability neuropathy only as the neurologic diagnosis-who presents to the emergency department with a mechanical fall today.  She states she was walking without her walker to the garbage can and threw something out turned around and slipped over her own feet and fell onto her side.  Denies head strike or loss of consciousness she does not take blood thinners.   She hurt her left foot and her lower back.   No significant downtime.  Husband helped her up.   No preceding illnesses, is otherwise been in her regular state of health.   No other acute medical complaints    External Medical Documents Reviewed: [facility] neurology and [facility] neurology notes from 2019 and 2022       Physical Exam   Triage Vital Signs: ED Triage Vitals  Enc Vitals Group     BP      Pulse      Resp      Temp      Temp src      SpO2      Weight      Height      Head Circumference      Peak Flow      Pain Score      Pain Loc      Pain Edu?      Excl. in [place]?     Most recent vital signs: Vitals:   05/25/22 1830  BP: (!) 160/92  Pulse: 81  Resp: 12  Temp: 97.9 F (36.6 C)  SpO2: 96%    General: Awake, no distress.  CV:  Good peripheral perfusion.  Resp:  Normal effort.  Abd:  No distention.  Other:  L-spine/sacral coccyx tenderness to palpation without obvious step-off or deformity.  She has tenderness to the first metatarsal left foot.  No obvious trauma  elsewhere deformities or tenderness to palpation.  Clear abdomen soft nontender   ED Results / Procedures / Treatments   Labs (all labs ordered are listed, but only abnormal results are displayed) Labs Reviewed  BASIC METABOLIC PANEL - Abnormal; Notable for the following components:      Result Value   Creatinine, Ser 1.13 (*)    Calcium 8.3 (*)    GFR, Estimated 52 (*)    All other components within normal limits  CBC WITH DIFFERENTIAL/PLATELET - Abnormal; Notable for the following components:   RBC 3.78 (*)    All other components within normal limits     I ordered and reviewed the above labs they are notable for creatinine is 1.1 at baseline, normal H&H normal white blood cell count   RADIOLOGY I independently reviewed and interpreted CT of the head see no obvious bleeding or midline shift   PROCEDURES:  Critical Care performed: No  Procedures   MEDICATIONS ORDERED IN ED: Medications - No data to display  External physician / consultants:  I spoke with Dr. Elayne Guerin of neurosurgery regarding care plan for this patient.  IMPRESSION / MDM / ASSESSMENT AND PLAN / ED COURSE  I reviewed the triage vital signs and the nursing notes.                                Patient's presentation is most consistent with acute presentation with potential threat to life or bodily function.  Differential diagnosis includes, but is not limited to, mechanical fall leading to blunt traumatic injury including intracranial bleeding, C-spine fracture dislocation, L-spine fracture dislocation, pelvic fracture dislocation, fracture of foot   The patient is on the cardiac monitor to evaluate for evidence of arrhythmia and/or significant heart rate changes.  MDM: Is a fracture of the first metatarsal left foot as well as L1 fracture nondisplaced no significant compression.  She has tenderness to both these areas.  Given her baseline instability with walking and frequent falls now  with these traumatic injuries, she will need admission for PT/OT/placement.  I spoke with Dr. Elayne Guerin regarding the L1 fracture and he recommended TSLO brace.         FINAL CLINICAL IMPRESSION(S) / ED DIAGNOSES   Final diagnoses:  Fall, initial encounter  Closed nondisplaced fracture of first metatarsal bone of left foot, initial encounter  Closed fracture of first lumbar vertebra, unspecified fracture morphology, initial encounter ([place])     Rx / DC Orders   ED Discharge Orders     None        Note:  This document was prepared using Dragon voice recognition software and may include unintentional dictation errors.    Lucillie Garfinkel, MD 05/25/22 2104

## 2022-05-25 NOTE — Assessment & Plan Note (Addendum)
Continue vitamin D.  

## 2022-05-25 NOTE — Assessment & Plan Note (Addendum)
Speech therapy evaluation finds nothing major.  Continue regular diet and PPI

## 2022-05-25 NOTE — Assessment & Plan Note (Addendum)
PPI  ? ?

## 2022-05-25 NOTE — Assessment & Plan Note (Signed)
Gait strengthening. Modification of home and lifestyle to further prevent any falls.

## 2022-05-25 NOTE — Assessment & Plan Note (Signed)
We will get tft. Get esr and crp.

## 2022-05-25 NOTE — Assessment & Plan Note (Addendum)
TLSO brace per neurosurgery.  Outpatient follow-up with neurosurgery in 2 to 3 weeks

## 2022-05-25 NOTE — ED Notes (Signed)
Pt given a cup of water 

## 2022-05-25 NOTE — Assessment & Plan Note (Addendum)
Stable. Tobacco cessation. PRN albuterol as deemed appropriate.  Scheduled Advair.

## 2022-05-25 NOTE — ED Triage Notes (Signed)
  Pt who arrived EMS had a mechanical fall prior to arrival where she fell onto her right side, with her left foot underneath her. She is now having severe lower back and left foot pain. She denies hitting her head/ LOC. She denies thinners. Pedal pulse and sensation intact in left foot, ROM limited due to pain.

## 2022-05-25 NOTE — ED Notes (Signed)
TLSO brace will be here on Friday morning

## 2022-05-25 NOTE — Assessment & Plan Note (Addendum)
Status post fall.  Pain control, immobilize the foot

## 2022-05-25 NOTE — Assessment & Plan Note (Addendum)
Likely multifactorial.  She does have baseline tremor and requires cane for ambulation but she was not able to find her cane yesterday started walking resulting in fall PT and OT eval-recommends SNF.

## 2022-05-25 NOTE — Assessment & Plan Note (Addendum)
Lab Results  Component Value Date   CREATININE 1.13 (H) 05/25/2022   CREATININE 0.95 05/04/2022   CREATININE 1.13 (H) 08/30/2021  Start NS at 75 cc/hour x 1 days.

## 2022-05-26 ENCOUNTER — Encounter: Payer: Self-pay | Admitting: Internal Medicine

## 2022-05-26 ENCOUNTER — Observation Stay: Payer: Medicare Other

## 2022-05-26 DIAGNOSIS — F32A Depression, unspecified: Secondary | ICD-10-CM | POA: Diagnosis not present

## 2022-05-26 DIAGNOSIS — J449 Chronic obstructive pulmonary disease, unspecified: Secondary | ICD-10-CM | POA: Diagnosis not present

## 2022-05-26 DIAGNOSIS — G629 Polyneuropathy, unspecified: Secondary | ICD-10-CM | POA: Diagnosis not present

## 2022-05-26 DIAGNOSIS — R531 Weakness: Secondary | ICD-10-CM | POA: Diagnosis not present

## 2022-05-26 DIAGNOSIS — Z882 Allergy status to sulfonamides status: Secondary | ICD-10-CM | POA: Diagnosis not present

## 2022-05-26 DIAGNOSIS — F17210 Nicotine dependence, cigarettes, uncomplicated: Secondary | ICD-10-CM | POA: Diagnosis present

## 2022-05-26 DIAGNOSIS — S32010A Wedge compression fracture of first lumbar vertebra, initial encounter for closed fracture: Secondary | ICD-10-CM | POA: Diagnosis present

## 2022-05-26 DIAGNOSIS — R296 Repeated falls: Secondary | ICD-10-CM | POA: Diagnosis not present

## 2022-05-26 DIAGNOSIS — F419 Anxiety disorder, unspecified: Secondary | ICD-10-CM | POA: Diagnosis present

## 2022-05-26 DIAGNOSIS — Z8673 Personal history of transient ischemic attack (TIA), and cerebral infarction without residual deficits: Secondary | ICD-10-CM | POA: Diagnosis not present

## 2022-05-26 DIAGNOSIS — M6259 Muscle wasting and atrophy, not elsewhere classified, multiple sites: Secondary | ICD-10-CM | POA: Diagnosis not present

## 2022-05-26 DIAGNOSIS — W19XXXA Unspecified fall, initial encounter: Secondary | ICD-10-CM

## 2022-05-26 DIAGNOSIS — S32019D Unspecified fracture of first lumbar vertebra, subsequent encounter for fracture with routine healing: Secondary | ICD-10-CM | POA: Diagnosis not present

## 2022-05-26 DIAGNOSIS — S92315A Nondisplaced fracture of first metatarsal bone, left foot, initial encounter for closed fracture: Secondary | ICD-10-CM | POA: Diagnosis not present

## 2022-05-26 DIAGNOSIS — E559 Vitamin D deficiency, unspecified: Secondary | ICD-10-CM

## 2022-05-26 DIAGNOSIS — Z515 Encounter for palliative care: Secondary | ICD-10-CM | POA: Diagnosis not present

## 2022-05-26 DIAGNOSIS — K219 Gastro-esophageal reflux disease without esophagitis: Secondary | ICD-10-CM | POA: Diagnosis not present

## 2022-05-26 DIAGNOSIS — G8929 Other chronic pain: Secondary | ICD-10-CM | POA: Diagnosis present

## 2022-05-26 DIAGNOSIS — Z888 Allergy status to other drugs, medicaments and biological substances status: Secondary | ICD-10-CM | POA: Diagnosis not present

## 2022-05-26 DIAGNOSIS — I679 Cerebrovascular disease, unspecified: Secondary | ICD-10-CM | POA: Diagnosis not present

## 2022-05-26 DIAGNOSIS — S92315D Nondisplaced fracture of first metatarsal bone, left foot, subsequent encounter for fracture with routine healing: Secondary | ICD-10-CM | POA: Diagnosis not present

## 2022-05-26 DIAGNOSIS — Z7902 Long term (current) use of antithrombotics/antiplatelets: Secondary | ICD-10-CM | POA: Diagnosis not present

## 2022-05-26 DIAGNOSIS — S32019A Unspecified fracture of first lumbar vertebra, initial encounter for closed fracture: Secondary | ICD-10-CM | POA: Diagnosis present

## 2022-05-26 DIAGNOSIS — R2681 Unsteadiness on feet: Secondary | ICD-10-CM | POA: Diagnosis not present

## 2022-05-26 DIAGNOSIS — Y92009 Unspecified place in unspecified non-institutional (private) residence as the place of occurrence of the external cause: Secondary | ICD-10-CM

## 2022-05-26 DIAGNOSIS — Z7189 Other specified counseling: Secondary | ICD-10-CM | POA: Diagnosis not present

## 2022-05-26 DIAGNOSIS — Z79899 Other long term (current) drug therapy: Secondary | ICD-10-CM | POA: Diagnosis not present

## 2022-05-26 DIAGNOSIS — E785 Hyperlipidemia, unspecified: Secondary | ICD-10-CM | POA: Diagnosis present

## 2022-05-26 DIAGNOSIS — N179 Acute kidney failure, unspecified: Secondary | ICD-10-CM | POA: Diagnosis present

## 2022-05-26 DIAGNOSIS — Z7409 Other reduced mobility: Secondary | ICD-10-CM | POA: Diagnosis not present

## 2022-05-26 DIAGNOSIS — G250 Essential tremor: Secondary | ICD-10-CM | POA: Diagnosis not present

## 2022-05-26 DIAGNOSIS — W010XXA Fall on same level from slipping, tripping and stumbling without subsequent striking against object, initial encounter: Secondary | ICD-10-CM | POA: Diagnosis present

## 2022-05-26 DIAGNOSIS — I700 Atherosclerosis of aorta: Secondary | ICD-10-CM | POA: Diagnosis not present

## 2022-05-26 DIAGNOSIS — Z823 Family history of stroke: Secondary | ICD-10-CM | POA: Diagnosis not present

## 2022-05-26 DIAGNOSIS — Z885 Allergy status to narcotic agent status: Secondary | ICD-10-CM | POA: Diagnosis not present

## 2022-05-26 DIAGNOSIS — G20A1 Parkinson's disease without dyskinesia, without mention of fluctuations: Secondary | ICD-10-CM | POA: Diagnosis present

## 2022-05-26 DIAGNOSIS — M810 Age-related osteoporosis without current pathological fracture: Secondary | ICD-10-CM | POA: Diagnosis present

## 2022-05-26 DIAGNOSIS — Z736 Limitation of activities due to disability: Secondary | ICD-10-CM | POA: Diagnosis not present

## 2022-05-26 LAB — CBC
HCT: 33.1 % — ABNORMAL LOW (ref 36.0–46.0)
Hemoglobin: 10.7 g/dL — ABNORMAL LOW (ref 12.0–15.0)
MCH: 31.2 pg (ref 26.0–34.0)
MCHC: 32.3 g/dL (ref 30.0–36.0)
MCV: 96.5 fL (ref 80.0–100.0)
Platelets: 136 K/uL — ABNORMAL LOW (ref 150–400)
RBC: 3.43 MIL/uL — ABNORMAL LOW (ref 3.87–5.11)
RDW: 13.6 % (ref 11.5–15.5)
WBC: 5.3 K/uL (ref 4.0–10.5)
nRBC: 0 % (ref 0.0–0.2)

## 2022-05-26 LAB — COMPREHENSIVE METABOLIC PANEL WITH GFR
ALT: 12 U/L (ref 0–44)
AST: 15 U/L (ref 15–41)
Albumin: 3.2 g/dL — ABNORMAL LOW (ref 3.5–5.0)
Alkaline Phosphatase: 81 U/L (ref 38–126)
Anion gap: 5 (ref 5–15)
BUN: 11 mg/dL (ref 8–23)
CO2: 25 mmol/L (ref 22–32)
Calcium: 8 mg/dL — ABNORMAL LOW (ref 8.9–10.3)
Chloride: 104 mmol/L (ref 98–111)
Creatinine, Ser: 1.11 mg/dL — ABNORMAL HIGH (ref 0.44–1.00)
GFR, Estimated: 53 mL/min — ABNORMAL LOW (ref >60)
Glucose, Bld: 101 mg/dL — ABNORMAL HIGH (ref 70–99)
Potassium: 3.7 mmol/L (ref 3.5–5.1)
Sodium: 134 mmol/L — ABNORMAL LOW (ref 135–145)
Total Bilirubin: 0.7 mg/dL (ref 0.3–1.2)
Total Protein: 5.9 g/dL — ABNORMAL LOW (ref 6.5–8.1)

## 2022-05-26 MED ORDER — FENTANYL CITRATE PF 50 MCG/ML IJ SOSY
25.0000 ug | PREFILLED_SYRINGE | INTRAMUSCULAR | Status: AC | PRN
  Administered 2022-05-26 (×3): 25 ug via INTRAVENOUS
  Filled 2022-05-26 (×3): qty 1

## 2022-05-26 MED ORDER — PANTOPRAZOLE SODIUM 40 MG PO TBEC
40.0000 mg | DELAYED_RELEASE_TABLET | Freq: Every day | ORAL | Status: DC
  Administered 2022-05-26 – 2022-05-30 (×5): 40 mg via ORAL
  Filled 2022-05-26 (×5): qty 1

## 2022-05-26 NOTE — Progress Notes (Signed)
Orthopedic Tech Progress Note Patient Details:  Alexandria Rodriguez [birth year] [id number]  Patient ID: Alexandria Rodriguez, female   DOB: January 08, 1951, 72 y.o.   MRN: [id number] Called order into hanger  Karolee Stamps 05/26/2022, 6:51 AM

## 2022-05-26 NOTE — Plan of Care (Signed)

## 2022-05-26 NOTE — Hospital Course (Signed)
72 year old female admitted status post fall with L1 vertebral fracture and subtle fracture of the base of the first metatarsal of left foot  2/16: Neurosurgery, PT and OT eval 2/17: TLSO brace when OOB, SNF recommended by PT and OT 2/18: Feeling nauseous 2/19: Stopping morphine for discharge planning

## 2022-05-26 NOTE — Evaluation (Signed)
Occupational Therapy Evaluation Patient Details Name: Alexandria Rodriguez MRN: [id number] DOB: 08/17/1950 Today's Date: 05/26/2022   History of Present Illness Alexandria Rodriguez is a 72 y.o. female with a history of chronic back pain, COPD, osteoporosis, stroke, HLD, GERD. Pt seen in ED today for fall on right side with her left foot underneath.  Patient is being followed by neurology for baseline tremor and history of Parkinson's. Found to have L1 compression fracture treated conservatively with TLSO; fracture of the first metatarsal left foot.   Clinical Impression   Ms Alexandria Rodriguez was seen for OT/PT co-evaluation this date. Prior to hospital admission, pt was MOS I using RW. Pt lives with spouse. Pt presents to acute OT demonstrating impaired ADL performance and functional mobility 2/2 pain, decreased activity tolerance and functional strength/ROM/balance deficits. Pt currently requires MIN A bed mobility. MOD A x2 sit<>stand and side steps along bed, poor tolerance 2/2 L foot 10/10 pain (premedicated and RN aware).MAX A don R shoe seated EOB. MOD A don TLSO sitting. Pt would benefit from skilled OT to address noted impairments and functional limitations (see below for any additional details). Upon hospital discharge, recommend STR to maximize pt safety, if pt chooses to return home with benefit from [facility] and assist from family for all mobility.    Recommendations for follow up therapy are one component of a multi-disciplinary discharge planning process, led by the attending physician.  Recommendations may be updated based on patient status, additional functional criteria and insurance authorization.   Follow Up Recommendations  Skilled nursing-short term rehab (<3 hours/day)     Assistance Recommended at Discharge Frequent or constant Supervision/Assistance  Patient can return home with the following A lot of help with walking and/or transfers;A lot of help with bathing/dressing/bathroom;Help with stairs or  ramp for entrance    Functional Status Assessment  Patient has had a recent decline in their functional status and demonstrates the ability to make significant improvements in function in a reasonable and predictable amount of time.  Equipment Recommendations  BSC/3in1    Recommendations for Other Services       Precautions / Restrictions Precautions Precautions: Fall Precaution Comments: cam boot L LE Required Braces or Orthoses: Spinal Brace Spinal Brace: Thoracolumbosacral orthotic Restrictions Weight Bearing Restrictions: No      Mobility Bed Mobility Overal bed mobility: Needs Assistance Bed Mobility: Rolling, Sidelying to Sit, Sit to Sidelying Rolling: Min assist Sidelying to sit: Min assist     Sit to sidelying: Min assist      Transfers Overall transfer level: Needs assistance Equipment used: Rolling walker (2 wheels) Transfers: Sit to/from Stand Sit to Stand: Mod assist, +2 physical assistance                  Balance Overall balance assessment: Needs assistance Sitting-balance support: Feet supported Sitting balance-Leahy Scale: Fair     Standing balance support: Bilateral upper extremity supported Standing balance-Leahy Scale: Poor                             ADL either performed or assessed with clinical judgement   ADL Overall ADL's : Needs assistance/impaired                                       General ADL Comments: MAX A don R shoe seated EOB. MOD A don TLSO  sitting.      Pertinent Vitals/Pain Pain Assessment Pain Assessment: 0-10 Pain Score: 10-Worst pain ever Pain Location: L foot with standing Pain Descriptors / Indicators: Discomfort, Grimacing, Guarding Pain Intervention(s): Limited activity within patient's tolerance, Repositioned, Premedicated before session     Hand Dominance     Extremity/Trunk Assessment Upper Extremity Assessment Upper Extremity Assessment: Generalized weakness  (baseline LUE tremor)   Lower Extremity Assessment Lower Extremity Assessment: LLE deficits/detail LLE: Unable to fully assess due to pain LLE Coordination: decreased gross motor   Cervical / Trunk Assessment Cervical / Trunk Assessment: Other exceptions (L1 fracture, TLSO)   Communication Communication Communication: No difficulties   Cognition Arousal/Alertness: Awake/alert Behavior During Therapy: WFL for tasks assessed/performed Overall Cognitive Status: Within Functional Limits for tasks assessed                                                  Home Living Family/patient expects to be discharged to:: Private residence Living Arrangements: Spouse/significant other Available Help at Discharge: Family;Available 24 hours/day Type of Home: House Home Access: Stairs to enter [employer] of Steps: 2 Entrance Stairs-Rails: Right;Left                 Home Equipment: Conservation officer, nature (2 wheels);Cane - single point;Shower seat          Prior Functioning/Environment Prior Level of Function : Independent/Modified Independent             Mobility Comments: used cane or walker at home, does not drive, needed assist for getting out of tub and going up.down steps at home          OT Problem List: Decreased strength;Decreased range of motion;Decreased activity tolerance;Impaired balance (sitting and/or standing);Decreased safety awareness      OT Treatment/Interventions: Self-care/ADL training;Therapeutic exercise;Energy conservation;DME and/or AE instruction;Therapeutic activities;Balance training;Patient/family education    OT Goals(Current goals can be found in the care plan section) Acute Rehab OT Goals Patient Stated Goal: to go home OT Goal Formulation: With patient Time For Goal Achievement: 06/09/22 Potential to Achieve Goals: Good ADL Goals Pt Will Perform Grooming: standing;with min assist Pt Will Perform Lower Body Dressing:  with min assist;sit to/from stand Pt Will Transfer to Toilet: with min assist;ambulating;regular height toilet  OT Frequency: Min 2X/week    Co-evaluation PT/OT/SLP Co-Evaluation/Treatment: Yes Reason for Co-Treatment: To address functional/ADL transfers;For patient/therapist safety PT goals addressed during session: Mobility/safety with mobility;Balance;Proper use of DME OT goals addressed during session: ADL's and self-care      AM-PAC OT "6 Clicks" Daily Activity     Outcome Measure Help from another person eating meals?: None Help from another person taking care of personal grooming?: A Little Help from another person toileting, which includes using toliet, bedpan, or urinal?: A Lot Help from another person bathing (including washing, rinsing, drying)?: A Lot Help from another person to put on and taking off regular upper body clothing?: A Lot Help from another person to put on and taking off regular lower body clothing?: A Lot 6 Click Score: 15   End of Session Nurse Communication: Mobility status  Activity Tolerance: Patient limited by pain Patient left: in bed;with call bell/phone within reach  OT Visit Diagnosis: Muscle weakness (generalized) (M62.81);Other abnormalities of gait and mobility (R26.89)  Time: [id number] OT Time Calculation (min): 19 min Charges:  OT General Charges $OT Visit: 1 Visit OT Evaluation $OT Eval Moderate Complexity: 1 Mod  [name], M.S. OTR/L  05/26/22, 2:33 PM  ascom [phone]

## 2022-05-26 NOTE — Consult Note (Signed)
Neurosurgery-New Consultation Evaluation 05/26/2022 Alexandria Rodriguez [id number]  Identifying Statement: Alexandria Rodriguez is a 72 y.o. female from [place] [id number] with a history of chronic back pain, COPD, osteoporosis, stroke, HLD, GERD  Physician Requesting Consultation: No ref. provider found  History of Present Illness: Alexandria Rodriguez is a 72 y.o presenting with acute low back pain after a fall on 05/25/22 around 4-5 pm. She states she was going to through something away in her trash can when she lost her balance and fell hurting her back and left foot. Today she reports mid to low back pain without pain that radiates into her legs. She denies any associated numbness or tingling.   Past Medical History:  Past Medical History:  Diagnosis Date   Anxiety    Cataract    Cerebrovascular disease    Chronic pain    back pain   Contact dermatitis    COPD (chronic obstructive pulmonary disease) ([place])    Depression    Diverticulosis 10/31/2010   On Tranverse colon   GERD (gastroesophageal reflux disease)    Humerus fracture 04/10/2016   Hyperlipidemia    Myasthenia gravis ([place]) 02/2013   Diagnosed by doctor Manuella Ghazi   Neuropathy    Osteoporosis    Stroke [facility])    Vitamin D deficiency     Social History: Social History   Socioeconomic History   Marital status: Married    Spouse name: Jaquelyn Bitter   Number of children: 3   Years of education: Not on file   Highest education level: Some college, no degree  Occupational History   Occupation: disabled  Tobacco Use   Smoking status: Every Day    Packs/day: 0.50    Years: 42.00    Total pack years: 21.00    Types: Cigarettes   Smokeless tobacco: Never   Tobacco comments:     Has been smoking for 30+ years, Cigarettes per day;10.  Vaping Use   Vaping Use: Never used  Substance and Sexual Activity   Alcohol use: No    Alcohol/week: 0.0 standard drinks of alcohol   Drug use: No   Sexual activity: Not on file  Other Topics Concern   Not  on file  Social History Narrative   Ambulates with walker rollator or using assitance holding on to walls or solid surfaces.    Social Determinants of Health   Financial Resource Strain: Low Risk  (03/21/2022)   Overall Financial Resource Strain (CARDIA)    Difficulty of Paying Living Expenses: Not hard at all  Food Insecurity: No Food Insecurity (05/25/2022)   Hunger Vital Sign    Worried About Running Out of Food in the Last Year: Never true    Ran Out of Food in the Last Year: Never true  Transportation Needs: No Transportation Needs (05/25/2022)   PRAPARE - Hydrologist (Medical): No    Lack of Transportation (Non-Medical): No  Physical Activity: Inactive (03/21/2022)   Exercise Vital Sign    Days of Exercise per Week: 0 days    Minutes of Exercise per Session: 0 min  Stress: No Stress Concern Present (03/21/2022)   [place]    Feeling of Stress : Not at all  Social Connections: Socially Isolated (03/21/2022)   Social Connection and Isolation Panel [NHANES]    Frequency of Communication with Friends and Family: Once a week    Frequency of Social Gatherings with Friends and Family: Never  Attends Religious Services: Never    Active Member of Clubs or Organizations: No    Attends Archivist Meetings: Never    Marital Status: Married  Human resources officer Violence: Not At Risk (05/25/2022)   Humiliation, Afraid, Rape, and Kick questionnaire    Fear of Current or Ex-Partner: No    Emotionally Abused: No    Physically Abused: No    Sexually Abused: No   Living arrangements (living alone, with partner): with partner  Family History: Family History  Problem Relation Age of Onset   Colon cancer Mother    Stroke Father    Cancer Sister        Ovarian, stable   Breast cancer Maternal Grandmother     Review of Systems:  Review of Systems - General ROS:  Negative Psychological ROS: Negative Ophthalmic ROS: Negative ENT ROS: Negative Hematological and Lymphatic ROS: Negative  Endocrine ROS: Negative Respiratory ROS: Negative Cardiovascular ROS: Negative Gastrointestinal ROS: Negative Genito-Urinary ROS: Negative Musculoskeletal ROS: Negative Neurological ROS: Negative Dermatological ROS: Negative  Physical Exam: BP 118/75 (BP Location: Right Arm)   Pulse 86   Temp 97.8 F (36.6 C)   Resp 17   Ht 5' 2"$  (1.575 m)   Wt 63 kg   SpO2 94%   BMI 25.42 kg/m  Body mass index is 25.42 kg/m. Body surface area is 1.66 meters squared. General appearance: Alert, cooperative, in no acute distress Head: Normocephalic, atraumatic Eyes: Normal, EOM intact Oropharynx: Moist without lesions Neck: Supple, no tenderness Heart: Normal, regular rate and rhythm, without murmur Lungs: Clear to auscultation, good air exchange Abdomen: Soft, nondistended Ext: No edema in LE bilaterally, good distal pulses  Neurologic exam:  Mental status: alertness: alert, orientation: person, place, time, affect: normal Speech: fluent and clear Cranial nerves:  CN II-XII grossly intact Motor:strength symmetric 5/5, normal muscle mass and tone in all extremities except left DF, PF and EHL deferred due to patient in boot. Pt is able to wiggle toes on left foot.  Sensory: intact to light touch in all extremities Reflexes: 2+ and symmetric bilaterally for arms and legs Coordination: intact finger to nose Gait: untested  Laboratory: Results for orders placed or performed during the hospital encounter of [id number]  Basic metabolic panel  Result Value Ref Range   Sodium 135 135 - 145 mmol/L   Potassium 3.6 3.5 - 5.1 mmol/L   Chloride 101 98 - 111 mmol/L   CO2 24 22 - 32 mmol/L   Glucose, Bld 91 70 - 99 mg/dL   BUN 9 8 - 23 mg/dL   Creatinine, Ser 1.13 (H) 0.44 - 1.00 mg/dL   Calcium 8.3 (L) 8.9 - 10.3 mg/dL   GFR, Estimated 52 (L) >60 mL/min   Anion gap 10 5  - 15  CBC with Differential  Result Value Ref Range   WBC 8.0 4.0 - 10.5 K/uL   RBC 3.78 (L) 3.87 - 5.11 MIL/uL   Hemoglobin 12.0 12.0 - 15.0 g/dL   HCT 36.8 36.0 - 46.0 %   MCV 97.4 80.0 - 100.0 fL   MCH 31.7 26.0 - 34.0 pg   MCHC 32.6 30.0 - 36.0 g/dL   RDW 13.7 11.5 - 15.5 %   Platelets 153 150 - 400 K/uL   nRBC 0.0 0.0 - 0.2 %   Neutrophils Relative % 72 %   Neutro Abs 5.8 1.7 - 7.7 K/uL   Lymphocytes Relative 14 %   Lymphs Abs 1.1 0.7 - 4.0 K/uL  Monocytes Relative 7 %   Monocytes Absolute 0.6 0.1 - 1.0 K/uL   Eosinophils Relative 5 %   Eosinophils Absolute 0.4 0.0 - 0.5 K/uL   Basophils Relative 1 %   Basophils Absolute 0.1 0.0 - 0.1 K/uL   Immature Granulocytes 1 %   Abs Immature Granulocytes 0.04 0.00 - 0.07 K/uL  Comprehensive metabolic panel  Result Value Ref Range   Sodium 134 (L) 135 - 145 mmol/L   Potassium 3.7 3.5 - 5.1 mmol/L   Chloride 104 98 - 111 mmol/L   CO2 25 22 - 32 mmol/L   Glucose, Bld 101 (H) 70 - 99 mg/dL   BUN 11 8 - 23 mg/dL   Creatinine, Ser 1.11 (H) 0.44 - 1.00 mg/dL   Calcium 8.0 (L) 8.9 - 10.3 mg/dL   Total Protein 5.9 (L) 6.5 - 8.1 g/dL   Albumin 3.2 (L) 3.5 - 5.0 g/dL   AST 15 15 - 41 U/L   ALT 12 0 - 44 U/L   Alkaline Phosphatase 81 38 - 126 U/L   Total Bilirubin 0.7 0.3 - 1.2 mg/dL   GFR, Estimated 53 (L) >60 mL/min   Anion gap 5 5 - 15  CBC  Result Value Ref Range   WBC 5.3 4.0 - 10.5 K/uL   RBC 3.43 (L) 3.87 - 5.11 MIL/uL   Hemoglobin 10.7 (L) 12.0 - 15.0 g/dL   HCT 33.1 (L) 36.0 - 46.0 %   MCV 96.5 80.0 - 100.0 fL   MCH 31.2 26.0 - 34.0 pg   MCHC 32.3 30.0 - 36.0 g/dL   RDW 13.6 11.5 - 15.5 %   Platelets 136 (L) 150 - 400 K/uL   nRBC 0.0 0.0 - 0.2 %   I personally reviewed labs  Imaging:  I personally reviewed radiology studies to include:  CT L spine 05/25/22 EXAM: CT LUMBAR SPINE WITHOUT CONTRAST   TECHNIQUE: Multidetector CT imaging of the lumbar spine was performed without intravenous contrast  administration. Multiplanar CT image reconstructions were also generated.   RADIATION DOSE REDUCTION: This exam was performed according to the departmental dose-optimization program which includes automated exposure control, adjustment of the mA and/or kV according to patient size and/or use of iterative reconstruction technique.   COMPARISON:  Lumbar spine radiographs 07/23/2014   FINDINGS: Segmentation: 5 lumbar type vertebral bodies.   Alignment: Normal alignment.   Vertebrae: There is minimal superior anterior endplate compression with cortical irregularity and linear lucency demonstrated at L1 suggesting acute nondisplaced fracture. No evidence of posterior extension. No other vertebral compression deformities are demonstrated. No destructive bone lesions.   Paraspinal and other soft tissues: No abnormal paraspinal mass or soft tissue infiltration. Incidental note of aortoiliac vascular calcifications and sigmoid colonic diverticula without evidence of acute diverticulitis.   Disc levels: Degenerative disc disease at L4-5 and L5-S1 levels, similar to prior study.   IMPRESSION: Cortical irregularity and depression of the anterior superior endplate of L1 suggesting acute nondisplaced fracture. No posterior involvement. Degenerative changes in the lower lumbar spine.     Electronically Signed   By: Lucienne Capers M.D.   On: 05/25/2022 19:33   Impression/Plan:     1.  Diagnosis: L1 compression fracture  2.  Plan - treat conservatively in TLSO brace when OOB - pain control - no plans for acute neurosurgical intervention at this time - Will arrange outpatient follow up in 2-3 weeks with lumbar xrays prior  Cooper Render PA-C Neurosurgery

## 2022-05-26 NOTE — Progress Notes (Signed)
  Progress Note   Patient: Alexandria Rodriguez [id number] DOB: [birth year] DOA: 05/25/2022     0 DOS: the patient was seen and examined on 05/26/2022   Brief hospital course: 72 year old female admitted status post fall with L1 vertebral fracture and subtle fracture of the base of the first metatarsal of left foot  2/16: Neurosurgery, PT and OT eval  Assessment and Plan: * Recurrent falls Likely multifactorial.  She does have baseline tremor and requires cane for ambulation but she was not able to find her cane yesterday started walking resulting in fall PT and OT eval-recommends SNF.  Fall at home, initial encounter Gait strengthening. Modification of home and lifestyle to further prevent any falls.    Vitamin D deficiency Continue vitamin D  Metatarsal fracture Status post fall.  Pain control, immobilize the foot  L1 vertebral fracture (HCC) TLSO brace per neurosurgery.  Outpatient follow-up with neurosurgery in 2 to 3 weeks  Essential tremor We will get tft. Get esr and crp.    COPD (chronic obstructive pulmonary disease) (HCC) Stable. Tobacco cessation. PRN albuterol as deemed appropriate.  Scheduled Advair.   Tobacco use Counseling done by admitting hospitalist   GERD (gastroesophageal reflux disease) PPI  Abnormal barium swallow Speech therapy evaluation finds nothing major.  Continue regular diet and PPI         Subjective: Complains of pain in her back and in her foot.  Wants to eat as she is very hungry  Physical Exam: Vitals:   05/25/22 1831 05/25/22 2300 05/26/22 0731 05/26/22 1522  BP:  111/82 118/75 131/74  Pulse:  86 86 99  Resp:  18 17 17  $ Temp:  97.8 F (36.6 C) 97.8 F (36.6 C) 98.6 F (37 C)  TempSrc:      SpO2:  98% 94% 97%  Weight: 63 kg     Height: 5' 2"$  (1.58 m)      72 year old female lying in the bed some pain Lungs clear to auscultation bilaterally Heart regular rate and rhythm Abdomen soft, benign Neuro alert and awake,  nonfocal Extremities/musculoskeletal: Tenderness in the lower back and left foot. Data Reviewed:  Left foot x-ray shows subtle fracture of the first metatarsal and CT of the lumbar spine showed nondisplaced fracture of the L1 vertebra  Family Communication: None at bedside  Disposition: Status is: Inpatient Remains inpatient appropriate because: Pain control and SNF placement  Planned Discharge Destination: Skilled nursing facility   DVT prophylaxis-heparin Time spent: 35 minutes  Author: Max Sane, MD 05/26/2022 7:06 PM  For on call review [email or web address].

## 2022-05-26 NOTE — TOC Initial Note (Signed)
Transition of Care [facility]) - Initial/Assessment Note    Patient Details  Name: Alexandria Rodriguez MRN: [id number] Date of Birth: 12/15/1950  Transition of Care [facility]) CM/SW Contact:    Conception Oms, RN Phone Number: 05/26/2022, 2:02 PM  Clinical Narrative:                   Plan to  treat conservatively in TLSO brace when OOB     Transition of Care [facility]) Screening Note   Patient Details  Name: Alexandria Rodriguez Date of Birth: 11-Aug-1950   Transition of Care [facility]) CM/SW Contact:    Conception Oms, RN Phone Number: 05/26/2022, 2:02 PM    Transition of Care Department [facility]) has reviewed patient and no TOC needs have been identified at this time. We will continue to monitor patient advancement through interdisciplinary progression rounds. If new patient transition needs arise, please place a TOC consult.    Patient Goals and CMS Choice            Expected Discharge Plan and Services                                              Prior Living Arrangements/Services                       Activities of Daily Living Home Assistive Devices/Equipment: Gilford Rile (specify type) ADL Screening (condition at time of admission) Patient's cognitive ability adequate to safely complete daily activities?: Yes Is the patient deaf or have difficulty hearing?: No Does the patient have difficulty seeing, even when wearing glasses/contacts?: No Does the patient have difficulty concentrating, remembering, or making decisions?: No Patient able to express need for assistance with ADLs?: Yes Does the patient have difficulty dressing or bathing?: No Independently performs ADLs?: Yes (appropriate for developmental age) Does the patient have difficulty walking or climbing stairs?: Yes Weakness of Legs: Both Weakness of Arms/Hands: None  Permission Sought/Granted                  Emotional Assessment              Admission diagnosis:  Fall [W19.XXXA] Fall, initial  encounter [id number]] Closed fracture of first lumbar vertebra, unspecified fracture morphology, initial encounter ([place]) [S32.019A] Closed nondisplaced fracture of first metatarsal bone of left foot, initial encounter [S92.315A] Patient Active Problem List   Diagnosis Date Noted   Fall at home, initial encounter 05/25/2022   L1 vertebral fracture ([place]) 05/25/2022   Metatarsal fracture 05/25/2022   Recurrent falls 05/25/2022   AKI (acute kidney injury) ([place]) 05/25/2022   Fall 05/25/2022   Tinnitus 05/20/2022   Stage 2 chronic kidney disease 05/30/2021   Impaired functional mobility, balance, gait, and endurance 03/29/2021   Carotid artery stenosis 06/18/2020   History of CVA in adulthood 06/18/2020   Eustachian tube dysfunction, left 05/11/2020   Lung nodule 03/15/2018   Aortic atherosclerosis ([place]) 03/14/2018   Essential tremor 06/25/2017   AD (atopic dermatitis) 06/01/2015   Back pain, chronic 06/01/2015   Compression fracture 06/01/2015   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   Myasthenia gravis ([place]) 06/01/2015   TMJ (temporomandibular joint disorder) 12/22/2014   Diverticulosis 12/22/2014   Depression 12/22/2014   GERD (gastroesophageal reflux disease) 12/22/2014   Fibrocystic breast disease 12/22/2014   Dyspnea, unspecified 12/22/2014   Tremor 12/22/2014   Radiculopathy of  cervical region 12/22/2014   Tobacco use 12/22/2014   Diffuse cystic mastopathy of breast 12/22/2014   Diverticulosis of intestine without perforation or abscess without bleeding 12/22/2014   History of adenomatous polyp of colon 12/04/2014   Osteoporosis 12/04/2014   B12 deficiency 11/04/2014   Deficiency of other specified B group vitamins 11/04/2014   Polyneuropathy ([place]) 11/03/2014   Abnormal barium swallow 10/02/2014   Dysphagia 10/02/2014   Anxiety 07/31/2013   Low back pain 07/31/2013   Vitamin D deficiency 07/31/2013   COPD (chronic obstructive pulmonary disease) ([place]) 12/27/2012   PCP:   Birdie Sons, MD Pharmacy:   [facility] [phone] - [address], [place] AT [facility] OF [address] [place] [address] Phone: [phone] Fax: [phone]     Social Determinants of Health (SDOH) Social History: [place]: No Food Insecurity (05/25/2022)  Housing: Low Risk  (05/25/2022)  Transportation Needs: No Transportation Needs (05/25/2022)  Utilities: Not At Risk (05/25/2022)  Alcohol Screen: Low Risk  (03/21/2022)  Depression (PHQ2-9): High Risk (05/01/2022)  Financial Resource Strain: Low Risk  (03/21/2022)  Physical Activity: Inactive (03/21/2022)  Social Connections: Socially Isolated (03/21/2022)  Stress: No Stress Concern Present (03/21/2022)  Tobacco Use: High Risk (05/26/2022)   SDOH Interventions:     Readmission Risk Interventions     No data to display

## 2022-05-26 NOTE — Evaluation (Signed)
Clinical/Bedside Swallow Evaluation Patient Details  Name: Alexandria Rodriguez MRN: [id number] Date of Birth: Jan 09, 1951  Today's Date: 05/26/2022 Time: SLP Start Time (ACUTE ONLY): 76 SLP Stop Time (ACUTE ONLY): 1410 SLP Time Calculation (min) (ACUTE ONLY): 50 min  Past Medical History:  Past Medical History:  Diagnosis Date   Anxiety    Cataract    Cerebrovascular disease    Chronic pain    back pain   Contact dermatitis    COPD (chronic obstructive pulmonary disease) ([place])    Depression    Diverticulosis 10/31/2010   On Tranverse colon   GERD (gastroesophageal reflux disease)    Humerus fracture 04/10/2016   Hyperlipidemia    Myasthenia gravis ([place]) 02/2013   Diagnosed by doctor Manuella Ghazi   Neuropathy    Osteoporosis    Stroke [facility])    Vitamin D deficiency    Past Surgical History:  Past Surgical History:  Procedure Laterality Date   ABDOMINAL HYSTERECTOMY  2000   due to excessive bleeeding   CERVICAL CONE BIOPSY     Cervical discectomy and fusion  [facility]   COLONOSCOPY WITH PROPOFOL N/A 12/06/2015   Procedure: COLONOSCOPY WITH PROPOFOL;  Surgeon: Manya Silvas, MD;  Location: [place];  Service: Endoscopy;  Laterality: N/A;   ESOPHAGOGASTRODUODENOSCOPY N/A 10/05/2014   Procedure: ESOPHAGOGASTRODUODENOSCOPY (EGD);  Surgeon: Manya Silvas, MD;  Location[facility]COPY;  Service: Endoscopy;  Laterality: N/A;   Repeat cuff surgery  2004   Developed adhesive capsulitis   ROTATOR CUFF REPAIR Right 2003   Shoulder   HPI:  Pt is a 72 y.o. female w/ PMH of tremor, chronic pain, anxiety, GERD, diverticulosis, cataract, neuropathy, gait instability, stroke, medical chart problem list includes myasthenia gravis but MD review of [facility] neurology and [facility] neurology notes from the past several years make no mention of myasthenia gravis and instead, mentiion functional neurologic disorder/atypical parkinsonism and gait instability w/ neuropathy only  as the neurologic diagnosis.  She presented to the emergency department with a mechanical fall today.  She states she was walking without her walker to the garbage can and threw something out turned around and slipped over her own feet and fell onto her side.  Denies head strike or loss of consciousness she does not take blood thinners.  She c/o foot and back pain.  No CXR imaging.   Pt endorsed she had an EGD in 2016 which revealed a schatzki's ring, then Dilation to tx.  Pt c/o intermittent Globus feelings and severe Belching when eating meals currently (no recent GI f/u).    Assessment / Plan / Recommendation  Clinical Impression    Pt seen for BSE this morning. Pt awake, verbal and pleasant. Communicated fully in general conversation. Multiple family members in room for support. Pt endorsed back discomfort when sitting fully upright; noted foot in boot also. NSG aware.  On [place] O2 support- 3L; afebrile, WBC not elevated.    OF NOTE: Pt endorses s/s of REFLUX at home; self-medicates. Pt endorses she had an EGD in 2016 which revealed a "schatzki's ring"; Dilation performed.  Pt c/o intermittent Globus feelings, coughing, and severe Belching when eating meals currently (no recent GI f/u).    Pt appears to present w/ functional oropharyngeal phase swallowing function w/ No overt oropharyngeal phase dysphagia appreciated during oral intake of trials; none reported by pt/family/NSG during recent Lunch meal w/in the past hour. No neuromuscular swallowing deficits appreciated during evaluation. Pt appears at reduced  risk for aspiration from an oropharyngeal phase standpoint following general aspiration precautions. HOWEVER, pt has a baseline presentation of GERD and episodes of REFLUX behavior, Globus. She is not on a PPI.  ANY Dysmotility or Regurgitation of Reflux material can increase risk for aspiration of the Reflux material during Retrograde flow thus impact Voicing and Pulmonary status. Pt described  issues of Globus, cough, and Belching frequently.    Pt sat upright in bed adequately given support and consumed several trials of thin liquids Via Straw, purees, and solid foods w/ No overt clinical s/s of aspiration noted; clear vocal quality b/t trials, no decline in pulmonary status, no cough/throat clearing, no multiple swallows noted post initial pharyngeal swallow. Oral phase appeared [facility] for bolus management and timely A-P transfer/clearing of material. Mastication appropriate for boluses. OM exam was [facility] for oral clearing; lingual/labial movements. No unilateral weakness. Speech clear.    Recommend continue a Regular diet (well-cut meats, moistened foods) w/ thin liquids. General aspiration precautions. Rest Breaks during meals/oral intake to allow for Esophageal clearing. REFLUX precautions strongly recommended to lessen chance for Regurgitation -- HOB elevated at night when sleeping. Pills in Puree if needed for ease of clearing/swallowing.  Requested MD consider a PPI for pt -- this was discussed w/ pt also.   Recommend pt f/u w/ GI for assessment/management of GERD/Dysmotility and tx as indicated. Discussion had on REFLUX, impact of REFLUX on swallowing and breathing, PPI, behaviors to manage REFLUX, Pills in Puree, and foods/prep/diet. MD to reconsult ST services if any new needs while admitted. NSG updated. Pt/Family appreciative of Education information. SLP Visit Diagnosis: Dysphagia, unspecified (R13.10) (Esophageal phase dysmotility)    Aspiration Risk   (reduced following general aspiration precautions); risk for aspiration of REFLUX material   Diet Recommendation   continue a Regular diet (well-cut meats, moistened foods) w/ thin liquids. General aspiration precautions. Rest Breaks during meals/oral intake to allow for Esophageal clearing. REFLUX precautions strongly recommended to lessen chance for Regurgitation -- HOB elevated at night when sleeping.   Medication Administration:  Whole meds with liquid (vs Whole in Puree as needed for ease of swallowing and Esophageal clearing)    Other  Recommendations Recommended Consults: Consider GI evaluation;Consider esophageal assessment Oral Care Recommendations: Oral care BID;Patient independent with oral care (setup)    Recommendations for follow up therapy are one component of a multi-disciplinary discharge planning process, led by the attending physician.  Recommendations may be updated based on patient status, additional functional criteria and insurance authorization.  Follow up Recommendations No SLP follow up      Assistance Recommended at Discharge  PRN  Functional Status Assessment Patient has had a recent decline in their functional status and demonstrates the ability to make significant improvements in function in a reasonable and predictable amount of time.  Frequency and Duration  (n/a)   (n/a)       Prognosis Prognosis for improved oropharyngeal function: Good Barriers to Reach Goals: Time post onset;Severity of deficits (Esophageal phase Dysmotility) Barriers/Prognosis Comment: Esophageal phase Dysmotility      Swallow Study   General Date of Onset: 05/25/22 HPI: Pt is a 72 y.o. female w/ PMH of tremor, chronic pain, anxiety, GERD, diverticulosis, cataract, neuropathy, gait instability, stroke, medical chart problem list includes myasthenia gravis but MD review of [facility] neurology and [facility] neurology notes from the past several years make no mention of myasthenia gravis and instead, mentiion functional neurologic disorder/atypical parkinsonism and gait instability w/ neuropathy only as the neurologic diagnosis.  She presented to the emergency department with a mechanical fall today.  She states she was walking without her walker to the garbage can and threw something out turned around and slipped over her own feet and fell onto her side.  Denies head strike or loss of consciousness she does not take blood  thinners.  She c/o foot and back pain.  No CXR imaging.  Pt endorsed she had an EGD in 2016 which revealed a schatzki's ring, then Dilation to tx.  Pt c/o intermittent Globus feelings and severe Belching when eating meals currently (no recent GI f/u). Type of Study: Bedside Swallow Evaluation Previous Swallow Assessment: none Diet Prior to this Study: Regular;Thin liquids (Level 0) Temperature Spikes Noted: No (WBC WNL, 5.3) Respiratory Status: Room air History of Recent Intubation: No Behavior/Cognition: Alert;Cooperative;Pleasant mood (slight tremors) Oral Cavity Assessment: Within Functional Limits Oral Care Completed by SLP: Recent completion by staff Oral Cavity - Dentition: Adequate natural dentition;Missing dentition Vision: Functional for self-feeding Self-Feeding Abilities: Able to feed self;Needs set up Patient Positioning: Postural control adequate for testing (needed positioning support d/t back discomfort - NSG aware) Baseline Vocal Quality: Normal Volitional Cough: Strong Volitional Swallow: Able to elicit    Oral/Motor/Sensory Function Overall Oral Motor/Sensory Function: Within functional limits   Ice Chips Ice chips: Not tested   Thin Liquid Thin Liquid: Within functional limits Presentation: Self Fed;Straw (10+ trials)    Nectar Thick Nectar Thick Liquid: Not tested   Honey Thick Honey Thick Liquid: Not tested   Puree Puree: Within functional limits Presentation: Self Fed;Spoon (5+ bites)   Solid     Solid: Within functional limits Presentation: Self Fed;Spoon (4+ bites) Other Comments: pt had just finished eating her lunch meal -- regular diet         Orinda Kenner, MS, [place]; [place] [phone] (ascom) Velisa Regnier 05/26/2022,2:26 PM

## 2022-05-26 NOTE — Evaluation (Signed)
Physical Therapy Evaluation Patient Details Name: Alexandria Rodriguez MRN: [id number] DOB: [birth year] Today's Date: 05/26/2022  History of Present Illness  Alexandria Rodriguez is a 72 y.o. female with a history of chronic back pain, COPD, osteoporosis, stroke, HLD, GERD. Pt seen in ED today for fall on right side with her left foot underneath.  Patient is being followed by neurology for baseline tremor and history of Parkinson's. Found to have L1 compression fracture treated conservatively with TLSO; fracture of the first metatarsal left foot.  Clinical Impression  Patient received in bed, she is agreeable to PT/OT assessment. She reports just received pain medicine in IV. She required min a for side lying to sit. Mod +2 to stand from bed. She is able to shift weight for about 2 steps but limited by pain and unable to walk. Patient will continue to benefit from skilled PT to improve functional mobility and independence for safe mobility.        Recommendations for follow up therapy are one component of a multi-disciplinary discharge planning process, led by the attending physician.  Recommendations may be updated based on patient status, additional functional criteria and insurance authorization.  Follow Up Recommendations Skilled nursing-short term rehab (<3 hours/day) Can patient physically be transported by private vehicle: No    Assistance Recommended at Discharge Frequent or constant Supervision/Assistance  Patient can return home with the following  A lot of help with walking and/or transfers;A lot of help with bathing/dressing/bathroom;Help with stairs or ramp for entrance;Assist for transportation    Equipment Recommendations None recommended by PT  Recommendations for Other Services       Functional Status Assessment Patient has had a recent decline in their functional status and demonstrates the ability to make significant improvements in function in a reasonable and predictable amount of time.      Precautions / Restrictions Precautions Precautions: Fall Precaution Comments: cam boot L LE Required Braces or Orthoses: Spinal Brace Spinal Brace: Thoracolumbosacral orthotic Restrictions Weight Bearing Restrictions: No      Mobility  Bed Mobility Overal bed mobility: Needs Assistance Bed Mobility: Rolling, Sidelying to Sit, Sit to Sidelying Rolling: Min assist Sidelying to sit: Min assist     Sit to sidelying: Min assist      Transfers Overall transfer level: Needs assistance Equipment used: Rolling walker (2 wheels) Transfers: Sit to/from Stand Sit to Stand: Mod assist, +2 physical assistance           General transfer comment: required mod A for boost up to standing.    Ambulation/Gait               General Gait Details: unable to shift weight to L LE due to pain. Able to take maybe 2 steps at edge of bed.  Stairs            Wheelchair Mobility    Modified Rankin (Stroke Patients Only)       Balance Overall balance assessment: Needs assistance Sitting-balance support: Feet supported Sitting balance-Leahy Scale: Fair     Standing balance support: Bilateral upper extremity supported, During functional activity, Reliant on assistive device for balance Standing balance-Leahy Scale: Poor Standing balance comment: reliant on AD                             Pertinent Vitals/Pain Pain Assessment Pain Assessment: 0-10 Pain Score: 10-Worst pain ever Pain Location: L foot with standing Pain Descriptors / Indicators: Discomfort, Grimacing,  Guarding Pain Intervention(s): Monitored during session, Premedicated before session, Repositioned    Home Living Family/patient expects to be discharged to:: Private residence Living Arrangements: Spouse/significant other Available Help at Discharge: Family;Available 24 hours/day Type of Home: House Home Access: Stairs to enter Entrance Stairs-Rails: Right;Left Entrance Stairs-Number of  Steps: 2     Home Equipment: Conservation officer, nature (2 wheels);Cane - single point;Shower seat      Prior Function Prior Level of Function : Independent/Modified Independent             Mobility Comments: used cane or walker at home, does not drive, needed assist for getting out of tub and going up.down steps at home       Hand Dominance        Extremity/Trunk Assessment   Upper Extremity Assessment Upper Extremity Assessment: Generalized weakness (baseline LUE tremor)    Lower Extremity Assessment Lower Extremity Assessment: LLE deficits/detail LLE: Unable to fully assess due to pain LLE Coordination: decreased gross motor    Cervical / Trunk Assessment Cervical / Trunk Assessment: Other exceptions (L1 fracture, TLSO)  Communication   Communication: No difficulties  Cognition Arousal/Alertness: Awake/alert Behavior During Therapy: WFL for tasks assessed/performed Overall Cognitive Status: Within Functional Limits for tasks assessed                                          General Comments      Exercises     Assessment/Plan    PT Assessment Patient needs continued PT services  PT Problem List Decreased strength;Decreased activity tolerance;Decreased balance;Decreased mobility;Decreased knowledge of precautions;Decreased safety awareness;Pain       PT Treatment Interventions DME instruction;Therapeutic exercise;Gait training;Balance training;Stair training;Functional mobility training;Therapeutic activities;Patient/family education;Neuromuscular re-education    PT Goals (Current goals can be found in the Care Plan section)  Acute Rehab PT Goals Patient Stated Goal: to decrease pain PT Goal Formulation: With patient Time For Goal Achievement: 06/08/22 Potential to Achieve Goals: Good    Frequency Min 2X/week     Co-evaluation PT/OT/SLP Co-Evaluation/Treatment: Yes Reason for Co-Treatment: To address functional/ADL transfers;For  patient/therapist safety PT goals addressed during session: Mobility/safety with mobility;Balance;Proper use of DME OT goals addressed during session: ADL's and self-care       AM-PAC PT "6 Clicks" Mobility  Outcome Measure Help needed turning from your back to your side while in a flat bed without using bedrails?: A Little Help needed moving from lying on your back to sitting on the side of a flat bed without using bedrails?: A Little Help needed moving to and from a bed to a chair (including a wheelchair)?: A Lot Help needed standing up from a chair using your arms (e.g., wheelchair or bedside chair)?: A Lot Help needed to walk in hospital room?: Total Help needed climbing 3-5 steps with a railing? : Total 6 Click Score: 12    End of Session Equipment Utilized During Treatment: Gait belt;Back brace;Other (comment) (Cam boot) Activity Tolerance: Patient limited by pain Patient left: in bed;with call bell/phone within reach;with bed alarm set Nurse Communication: Mobility status;Weight bearing status PT Visit Diagnosis: Other abnormalities of gait and mobility (R26.89);Muscle weakness (generalized) (M62.81);Difficulty in walking, not elsewhere classified (R26.2);Pain;History of falling (Z91.81);Unsteadiness on feet (R26.81) Pain - Right/Left: Left Pain - part of body:  (foot and back)    Time: [id number] PT Time Calculation (min) (ACUTE ONLY): 16 min   Charges:  PT Evaluation $PT Eval Moderate Complexity: 1 Mod          Corneisha Alvi, PT, GCS 05/26/22,2:31 PM

## 2022-05-27 DIAGNOSIS — E559 Vitamin D deficiency, unspecified: Secondary | ICD-10-CM | POA: Diagnosis not present

## 2022-05-27 DIAGNOSIS — W19XXXA Unspecified fall, initial encounter: Secondary | ICD-10-CM | POA: Diagnosis not present

## 2022-05-27 DIAGNOSIS — R296 Repeated falls: Secondary | ICD-10-CM | POA: Diagnosis not present

## 2022-05-27 DIAGNOSIS — S92315A Nondisplaced fracture of first metatarsal bone, left foot, initial encounter for closed fracture: Secondary | ICD-10-CM | POA: Diagnosis not present

## 2022-05-27 MED ORDER — ACETAMINOPHEN 500 MG PO TABS
1000.0000 mg | ORAL_TABLET | Freq: Once | ORAL | Status: AC
  Administered 2022-05-27: 1000 mg via ORAL
  Filled 2022-05-27: qty 2

## 2022-05-27 MED ORDER — FENTANYL CITRATE PF 50 MCG/ML IJ SOSY
25.0000 ug | PREFILLED_SYRINGE | INTRAMUSCULAR | Status: DC | PRN
  Administered 2022-05-27 – 2022-05-28 (×3): 25 ug via INTRAVENOUS
  Filled 2022-05-27 (×3): qty 1

## 2022-05-27 MED ORDER — ACETAMINOPHEN 500 MG PO TABS
1000.0000 mg | ORAL_TABLET | Freq: Four times a day (QID) | ORAL | Status: DC | PRN
  Administered 2022-05-27 – 2022-05-30 (×5): 1000 mg via ORAL
  Filled 2022-05-27 (×5): qty 2

## 2022-05-27 NOTE — Progress Notes (Signed)
  Progress Note   Patient: Alexandria Rodriguez [id number] DOB: 1950/07/21 DOA: 05/25/2022     1 DOS: the patient was seen and examined on 05/27/2022   Brief hospital course: 72 year old female admitted status post fall with L1 vertebral fracture and subtle fracture of the base of the first metatarsal of left foot  2/16: Neurosurgery, PT and OT eval 2/17: TLSO brace when OOB, SNF recommended by PT and OT  Assessment and Plan: * Recurrent falls Likely multifactorial.  She does have baseline tremor and requires cane for ambulation but she was not able to find her cane yesterday started walking resulting in fall PT and OT eval-recommends SNF.  TOC aware and working on placement  Fall at home, initial Nutritional therapist. Modification of home and lifestyle to further prevent any falls.    Vitamin D deficiency Continue vitamin D  Metatarsal fracture Status post fall.  Pain control, foot is immobilized in the boot  L1 vertebral fracture (HCC) TLSO brace when OOB per neurosurgery.  Outpatient follow-up with neurosurgery in 2 to 3 weeks  Essential tremor Check TSH   COPD (chronic obstructive pulmonary disease) (HCC) Stable. Tobacco cessation. PRN albuterol as deemed appropriate.  Scheduled Advair.   Tobacco use Counseling done by admitting hospitalist   GERD (gastroesophageal reflux disease) PPI  Abnormal barium swallow Speech therapy evaluation finds nothing major.  Continue regular diet and PPI         Subjective: Requesting pain medication  Physical Exam: Vitals:   05/26/22 1522 05/27/22 0002 05/27/22 0500 05/27/22 0749  BP: 131/74 132/87  138/84  Pulse: 99 96  83  Resp: 17 18  16  $ Temp: 98.6 F (37 C) 98.7 F (37.1 C)  98.1 F (36.7 C)  TempSrc:      SpO2: 97% 94%  97%  Weight:   59.2 kg   Height:       72 year old female lying in the bed some pain Lungs clear to auscultation bilaterally Heart regular rate and rhythm Abdomen soft,  benign Neuro alert and awake, nonfocal Extremities/musculoskeletal: Tenderness in the lower back and left foot which has boots on Data Reviewed:  There are no new results to review at this time.  Family Communication: None at bedside  Disposition: Status is: Inpatient Remains inpatient appropriate because: Medically stable, waiting for SNF placement  Planned Discharge Destination: Skilled nursing facility   DVT prophylaxis-heparin Time spent: 35 minutes  Author: Max Sane, MD 05/27/2022 10:42 AM  For on call review [email or web address].

## 2022-05-27 NOTE — Assessment & Plan Note (Signed)
Likely multifactorial.  She does have baseline tremor and requires cane for ambulation but she was not able to find her cane yesterday started walking resulting in fall PT and OT eval-recommends SNF.  TOC aware and working on placement

## 2022-05-27 NOTE — Assessment & Plan Note (Signed)
PPI ?

## 2022-05-27 NOTE — Plan of Care (Signed)

## 2022-05-27 NOTE — Progress Notes (Signed)
       CROSS COVER NOTE  NAME: Alexandria Rodriguez MRN: [id number] DOB : 20-Jul-1950    HPI/Events of Note   Patient admitted with L1 vertebral fractur and recurrent falls Nurse reports patient c/o 8/10 leg pain, multiple medication allergies  Assessment and  Interventions   Assessment:  Plan: 1 gm acetaminophen ordered       Kathlene Cote NP [facility]

## 2022-05-27 NOTE — Assessment & Plan Note (Signed)
Speech therapy evaluation finds nothing major.  Continue regular diet and PPI

## 2022-05-27 NOTE — Assessment & Plan Note (Signed)
TLSO brace when OOB per neurosurgery.  Outpatient follow-up with neurosurgery in 2 to 3 weeks

## 2022-05-27 NOTE — Assessment & Plan Note (Signed)
Stable. Tobacco cessation. PRN albuterol as deemed appropriate.  Scheduled Advair.

## 2022-05-27 NOTE — Assessment & Plan Note (Signed)
Counseling done by admitting hospitalist

## 2022-05-27 NOTE — Assessment & Plan Note (Signed)
Status post fall.  Pain control, foot is immobilized in the boot

## 2022-05-27 NOTE — Assessment & Plan Note (Signed)
Continue vitamin D.  

## 2022-05-27 NOTE — Assessment & Plan Note (Signed)
Check TSH 

## 2022-05-28 DIAGNOSIS — S92315A Nondisplaced fracture of first metatarsal bone, left foot, initial encounter for closed fracture: Secondary | ICD-10-CM | POA: Diagnosis not present

## 2022-05-28 DIAGNOSIS — W19XXXA Unspecified fall, initial encounter: Secondary | ICD-10-CM | POA: Diagnosis not present

## 2022-05-28 DIAGNOSIS — E559 Vitamin D deficiency, unspecified: Secondary | ICD-10-CM | POA: Diagnosis not present

## 2022-05-28 DIAGNOSIS — R296 Repeated falls: Secondary | ICD-10-CM | POA: Diagnosis not present

## 2022-05-28 MED ORDER — ONDANSETRON HCL 4 MG/2ML IJ SOLN
4.0000 mg | Freq: Four times a day (QID) | INTRAMUSCULAR | Status: DC | PRN
  Filled 2022-05-28: qty 2

## 2022-05-28 MED ORDER — IBUPROFEN 400 MG PO TABS
400.0000 mg | ORAL_TABLET | Freq: Four times a day (QID) | ORAL | Status: DC | PRN
  Administered 2022-05-28 – 2022-05-29 (×4): 400 mg via ORAL
  Filled 2022-05-28 (×4): qty 1

## 2022-05-28 MED ORDER — HEPARIN SODIUM (PORCINE) 5000 UNIT/ML IJ SOLN
5000.0000 [IU] | Freq: Two times a day (BID) | INTRAMUSCULAR | Status: DC
  Administered 2022-05-28 – 2022-05-30 (×4): 5000 [IU] via SUBCUTANEOUS
  Filled 2022-05-28 (×4): qty 1

## 2022-05-28 MED ORDER — MORPHINE SULFATE (PF) 2 MG/ML IV SOLN: 2.0000 mg | INTRAVENOUS | Status: DC | PRN

## 2022-05-28 NOTE — Assessment & Plan Note (Signed)
Continue vitamin D.  

## 2022-05-28 NOTE — Assessment & Plan Note (Signed)
Stable. Tobacco cessation. PRN albuterol as deemed appropriate.  Scheduled Advair.

## 2022-05-28 NOTE — Assessment & Plan Note (Signed)
TLSO brace when OOB per neurosurgery.  Outpatient follow-up with neurosurgery in 2 to 3 weeks

## 2022-05-28 NOTE — Assessment & Plan Note (Signed)
Check TSH 

## 2022-05-28 NOTE — NC FL2 (Signed)
[place] LEVEL OF CARE FORM     IDENTIFICATION  Patient Name: Alexandria Rodriguez Birthdate: 1951/02/04 Sex: female Admission Date (Current Location): 05/25/2022  [facility] and [place] Number:  Engineering geologist and Address:  [facility], [address] [place], [place] [address]      Provider Number: [id number]  Attending Physician Name and Address:  Max Sane, MD  Relative Name and Phone Number:  DPR-Earnest Bigg [phone] Home    Current Level of Care: SNF Recommended Level of Care: [place] Prior Approval Number:    Date Approved/Denied:   PASRR Number: [id number]  Discharge Plan: SNF    Current Diagnoses: Patient Active Problem List   Diagnosis Date Noted   Closed compression fracture of body of L1 vertebra ([place]) 05/26/2022   Fall at home, initial encounter 05/25/2022   L1 vertebral fracture ([place]) 05/25/2022   Metatarsal fracture 05/25/2022   Recurrent falls 05/25/2022   Fall 05/25/2022   Tinnitus 05/20/2022   Stage 2 chronic kidney disease 05/30/2021   Impaired functional mobility, balance, gait, and endurance 03/29/2021   Carotid artery stenosis 06/18/2020   History of CVA in adulthood 06/18/2020   Eustachian tube dysfunction, left 05/11/2020   Lung nodule 03/15/2018   Aortic atherosclerosis ([place]) 03/14/2018   Essential tremor 06/25/2017   AD (atopic dermatitis) 06/01/2015   Back pain, chronic 06/01/2015   Compression fracture 06/01/2015   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   TMJ (temporomandibular joint disorder) 12/22/2014   Diverticulosis 12/22/2014   Depression 12/22/2014   GERD (gastroesophageal reflux disease) 12/22/2014   Fibrocystic breast disease 12/22/2014   Dyspnea, unspecified 12/22/2014   Tremor 12/22/2014   Radiculopathy of cervical region 12/22/2014   Tobacco use 12/22/2014   Diffuse cystic mastopathy of breast 12/22/2014   Diverticulosis of intestine without perforation or  abscess without bleeding 12/22/2014   History of adenomatous polyp of colon 12/04/2014   Osteoporosis 12/04/2014   B12 deficiency 11/04/2014   Deficiency of other specified B group vitamins 11/04/2014   Polyneuropathy ([place]) 11/03/2014   Abnormal barium swallow 10/02/2014   Dysphagia 10/02/2014   Anxiety 07/31/2013   Low back pain 07/31/2013   Vitamin D deficiency 07/31/2013   COPD (chronic obstructive pulmonary disease) ([place]) 12/27/2012    Orientation RESPIRATION BLADDER Height & Weight     Self, Time, Situation, Place  Normal Incontinent Weight: 59.1 kg Height:  5' 2"$  (157.5 cm)  BEHAVIORAL SYMPTOMS/MOOD NEUROLOGICAL BOWEL NUTRITION STATUS      Continent  (Dysphagia)  AMBULATORY STATUS COMMUNICATION OF NEEDS Skin   Limited Assist Verbally Normal                       Personal Care Assistance Level of Assistance  Bathing, Dressing Bathing Assistance: Limited assistance   Dressing Assistance: Limited assistance     Functional Limitations Info             SPECIAL CARE FACTORS FREQUENCY  PT (By licensed PT), OT (By licensed OT)     PT Frequency: 5 X weekly OT Frequency: 5 X weekly            Contractures      Additional Factors Info                  Current Medications (05/28/2022):  This is the current hospital active medication list Current Facility-Administered Medications  Medication Dose Route Frequency Provider Last Rate Last Admin   acetaminophen (TYLENOL) tablet 1,000 mg  1,000 mg Oral Q6H PRN Max Sane, MD   1,000 mg at 05/27/22 2119   heparin injection 5,000 Units  5,000 Units Subcutaneous Q12H Max Sane, MD       ibuprofen (ADVIL) tablet 400 mg  400 mg Oral Q6H PRN Max Sane, MD   400 mg at 05/28/22 1109   morphine (PF) 2 MG/ML injection 2 mg  2 mg Intravenous Q4H PRN Max Sane, MD       nicotine (NICODERM CQ - dosed in mg/24 hours) patch 14 mg  14 mg Transdermal Daily Florina Ou V, MD   14 mg at 05/26/22 0956   ondansetron  (ZOFRAN) injection 4 mg  4 mg Intravenous Q6H PRN Max Sane, MD       pantoprazole (PROTONIX) EC tablet 40 mg  40 mg Oral Daily Manuella Ghazi, Vipul, MD   40 mg at 05/28/22 1109   sodium chloride flush (NS) 0.9 % injection 3 mL  3 mL Intravenous Q12H Para Skeans, MD   3 mL at 05/28/22 1111     Discharge Medications: Please see discharge summary for a list of discharge medications.  Relevant Imaging Results:  Relevant Lab Results:   Additional Information [id number]  Harriet Masson, RN

## 2022-05-28 NOTE — Assessment & Plan Note (Signed)
Likely multifactorial.  She does have baseline tremor and requires cane for ambulation but she was not able to find her cane yesterday started walking resulting in fall PT and OT eval-recommends SNF.  TOC aware and working on placement

## 2022-05-28 NOTE — Progress Notes (Signed)
  Progress Note   Patient: Alexandria Rodriguez [id number] DOB: 08-28-1950 DOA: 05/25/2022     2 DOS: the patient was seen and examined on 05/28/2022   Brief hospital course: 72 year old female admitted status post fall with L1 vertebral fracture and subtle fracture of the base of the first metatarsal of left foot  2/16: Neurosurgery, PT and OT eval 2/17: TLSO brace when OOB, SNF recommended by PT and OT 2/18: Feeling nauseous  Assessment and Plan: * Recurrent falls Likely multifactorial.  She does have baseline tremor and requires cane for ambulation but she was not able to find her cane yesterday started walking resulting in fall PT and OT eval-recommends SNF.  TOC aware and working on placement  Fall at home, initial Nutritional therapist. Modification of home and lifestyle to further prevent any falls.    Vitamin D deficiency Continue vitamin D  Metatarsal fracture Status post fall.  Pain control, foot is immobilized in the boot  L1 vertebral fracture (HCC) TLSO brace when OOB per neurosurgery.  Outpatient follow-up with neurosurgery in 2 to 3 weeks  Essential tremor Check TSH   COPD (chronic obstructive pulmonary disease) (HCC) Stable. Tobacco cessation. PRN albuterol as deemed appropriate.  Scheduled Advair.   Tobacco use Counseling done by admitting hospitalist   GERD (gastroesophageal reflux disease) Continue PPI  Abnormal barium swallow Speech therapy evaluation finds nothing major.  Continue regular diet and PPI         Subjective: Feels nauseous.  Agreeable to stop fentanyl and try ibuprofen if in case narcotic is contributing to her nausea  Physical Exam: Vitals:   05/27/22 1531 05/28/22 0010 05/28/22 0459 05/28/22 0833  BP: 135/85 116/75  (!) 129/94  Pulse: 85 92  (!) 110  Resp: 17 18  17  $ Temp: 98.5 F (36.9 C) 98.7 F (37.1 C)  98.1 F (36.7 C)  TempSrc:      SpO2: 98% 95%  99%  Weight:   59.1 kg   Height:       72 year old  female lying in the bed some pain Lungs clear to auscultation bilaterally Heart regular rate and rhythm Abdomen soft, benign Neuro alert and awake, nonfocal Extremities/musculoskeletal: Tenderness in the lower back and left foot. Data Reviewed:  There are no new results to review at this time.  Family Communication: None at bedside  Disposition: Status is: Inpatient Remains inpatient appropriate because: Medically stable, needs SNF  Planned Discharge Destination: Skilled nursing facility   DVT prophylaxis-subcu heparin Time spent: 35 minutes  Author: Max Sane, MD 05/28/2022 11:44 AM  For on call review [email or web address].

## 2022-05-28 NOTE — Assessment & Plan Note (Signed)
Continue PPI ?

## 2022-05-28 NOTE — Assessment & Plan Note (Signed)
Speech therapy evaluation finds nothing major.  Continue regular diet and PPI

## 2022-05-28 NOTE — TOC Progression Note (Signed)
Transition of Care [facility]) - Progression Note    Patient Details  Name: BATUL MENTING MRN: [id number] Date of Birth: [birth year]  Transition of [facility]) CM/SW Contact  Zigmund Daniel Dorian Pod, RN Phone Number: [phone] 05/28/2022, 3:06 PM  Clinical Narrative:    [facility] RN attempted to reach DPR spouse [name]) however unsuccessful (only able to leave a message). No one at bedside to discuss choice for the recommended SNF level of care. Will continue attempts for choice on facilities.  TOC will continue to follow up for SNF level of care.        Expected Discharge Plan and Services                                               Social Determinants of Health (SDOH) Interventions SDOH Screenings   Food Insecurity: No Food Insecurity (05/25/2022)  Housing: Low Risk  (05/25/2022)  Transportation Needs: No Transportation Needs (05/25/2022)  Utilities: Not At Risk (05/25/2022)  Alcohol Screen: Low Risk  (03/21/2022)  Depression (PHQ2-9): High Risk (05/01/2022)  Financial Resource Strain: Low Risk  (03/21/2022)  Physical Activity: Inactive (03/21/2022)  Social Connections: Socially Isolated (03/21/2022)  Stress: No Stress Concern Present (03/21/2022)  Tobacco Use: High Risk (05/26/2022)    Readmission Risk Interventions     No data to display

## 2022-05-28 NOTE — Assessment & Plan Note (Signed)
Status post fall.  Pain control, foot is immobilized in the boot

## 2022-05-28 NOTE — Assessment & Plan Note (Signed)
Counseling done by admitting hospitalist

## 2022-05-29 ENCOUNTER — Other Ambulatory Visit: Payer: Self-pay | Admitting: Family Medicine

## 2022-05-29 DIAGNOSIS — R531 Weakness: Secondary | ICD-10-CM | POA: Diagnosis not present

## 2022-05-29 DIAGNOSIS — Z7409 Other reduced mobility: Secondary | ICD-10-CM | POA: Diagnosis not present

## 2022-05-29 DIAGNOSIS — E559 Vitamin D deficiency, unspecified: Secondary | ICD-10-CM | POA: Diagnosis not present

## 2022-05-29 DIAGNOSIS — H9313 Tinnitus, bilateral: Secondary | ICD-10-CM

## 2022-05-29 DIAGNOSIS — S92315A Nondisplaced fracture of first metatarsal bone, left foot, initial encounter for closed fracture: Secondary | ICD-10-CM | POA: Diagnosis not present

## 2022-05-29 DIAGNOSIS — Z7189 Other specified counseling: Secondary | ICD-10-CM

## 2022-05-29 DIAGNOSIS — R296 Repeated falls: Secondary | ICD-10-CM | POA: Diagnosis not present

## 2022-05-29 DIAGNOSIS — W19XXXA Unspecified fall, initial encounter: Secondary | ICD-10-CM | POA: Diagnosis not present

## 2022-05-29 MED ORDER — IBUPROFEN 400 MG PO TABS
400.0000 mg | ORAL_TABLET | Freq: Four times a day (QID) | ORAL | Status: DC | PRN
  Administered 2022-05-29 – 2022-05-30 (×2): 400 mg via ORAL
  Filled 2022-05-29 (×2): qty 1

## 2022-05-29 MED ORDER — MORPHINE SULFATE (PF) 2 MG/ML IV SOLN: 1.0000 mg | INTRAVENOUS | Status: DC | PRN

## 2022-05-29 NOTE — Assessment & Plan Note (Signed)
Speech therapy evaluation finds nothing major.  Continue regular diet and PPI

## 2022-05-29 NOTE — Progress Notes (Signed)
Physical Therapy Treatment Patient Details Name: Alexandria Rodriguez MRN: [id number] DOB: 12-19-1950 Today's Date: 05/29/2022   History of Present Illness Alexandria Rodriguez is a 72 y.o. female with a history of chronic back pain, COPD, osteoporosis, stroke, HLD, GERD. Pt seen in ED today for fall on right side with her left foot underneath.  Patient is being followed by neurology for baseline tremor and history of Parkinson's. Found to have L1 compression fracture treated conservatively with TLSO; fracture of the first metatarsal left foot.    PT Comments    Great functional progression this date. Pt tolerated 34f of gait training with RW in room with CGA for safety. Pt with slight fatigue upon exertion and did require assist to donn TLSO and CAM Boot correctly. Will plan to continue PT acutely until d/c.    Recommendations for follow up therapy are one component of a multi-disciplinary discharge planning process, led by the attending physician.  Recommendations may be updated based on patient status, additional functional criteria and insurance authorization.  Follow Up Recommendations  Skilled nursing-short term rehab (<3 hours/day) Can patient physically be transported by private vehicle: Yes   Assistance Recommended at Discharge Frequent or constant Supervision/Assistance  Patient can return home with the following A lot of help with walking and/or transfers;A lot of help with bathing/dressing/bathroom;Help with stairs or ramp for entrance;Assist for transportation   Equipment Recommendations  None recommended by PT    Recommendations for Other Services       Precautions / Restrictions Precautions Precautions: Fall Precaution Comments: cam boot L LE Required Braces or Orthoses: Spinal Brace Spinal Brace: Thoracolumbosacral orthotic Restrictions Weight Bearing Restrictions: No     Mobility  Bed Mobility Overal bed mobility: Needs Assistance Bed Mobility: Rolling, Sidelying to  Sit Rolling: Supervision Sidelying to sit: Supervision     Sit to sidelying: Min assist      Transfers Overall transfer level: Needs assistance Equipment used: Rolling walker (2 wheels) Transfers: Sit to/from Stand Sit to Stand: Min assist                Ambulation/Gait Ambulation/Gait assistance: Min guard Gait Distance (Feet): 75 Feet Assistive device: Rolling walker (2 wheels) Gait Pattern/deviations: Step-through pattern, Decreased step length - right, Decreased step length - left       General Gait Details: Great progression with gait distance this session   Stairs             Wheelchair Mobility    Modified Rankin (Stroke Patients Only)       Balance Overall balance assessment: Needs assistance Sitting-balance support: Feet supported Sitting balance-Leahy Scale: Good     Standing balance support: Bilateral upper extremity supported, During functional activity, Reliant on assistive device for balance Standing balance-Leahy Scale: Fair Standing balance comment: reliant on AD                            Cognition Arousal/Alertness: Awake/alert Behavior During Therapy: WFL for tasks assessed/performed Overall Cognitive Status: Within Functional Limits for tasks assessed                                          Exercises      General Comments General comments (skin integrity, edema, etc.): Pt educated on proper donning of CAM boot and TLSO. Continued education beneficial.  Pertinent Vitals/Pain Pain Assessment Pain Assessment: No/denies pain Pain Intervention(s): Limited activity within patient's tolerance    Home Living                          Prior Function            PT Goals (current goals can now be found in the care plan section) Acute Rehab PT Goals Patient Stated Goal: to decrease pain    Frequency    Min 2X/week      PT Plan      Co-evaluation               AM-PAC PT "6 Clicks" Mobility   Outcome Measure  Help needed turning from your back to your side while in a flat bed without using bedrails?: A Little Help needed moving from lying on your back to sitting on the side of a flat bed without using bedrails?: A Little Help needed moving to and from a bed to a chair (including a wheelchair)?: A Lot Help needed standing up from a chair using your arms (e.g., wheelchair or bedside chair)?: A Lot Help needed to walk in hospital room?: Total Help needed climbing 3-5 steps with a railing? : Total 6 Click Score: 12    End of Session Equipment Utilized During Treatment: Gait belt;Back brace;Other (comment) Activity Tolerance: Patient tolerated treatment well Patient left: in bed;with call bell/phone within reach;with bed alarm set;with family/visitor present Nurse Communication: Mobility status PT Visit Diagnosis: Other abnormalities of gait and mobility (R26.89);Muscle weakness (generalized) (M62.81);Difficulty in walking, not elsewhere classified (R26.2);Pain;History of falling (Z91.81);Unsteadiness on feet (R26.81) Pain - Right/Left: Left     Time: [id number] PT Time Calculation (min) (ACUTE ONLY): 18 min  Charges:  $Gait Training: 8-22 mins                    Mikel Cella, PTA    Alexandria Rodriguez 05/29/2022, 3:29 PM

## 2022-05-29 NOTE — Progress Notes (Addendum)
  Progress Note   Patient: Alexandria Rodriguez [id number] DOB: 1950/09/28 DOA: 05/25/2022     3 DOS: the patient was seen and examined on 05/29/2022   Brief hospital course: 72 year old female admitted status post fall with L1 vertebral fracture and subtle fracture of the base of the first metatarsal of left foot  2/16: Neurosurgery, PT and OT eval 2/17: TLSO brace when OOB, SNF recommended by PT and OT 2/18: Feeling nauseous 2/19: Stopping morphine for discharge planning  Assessment and Plan: * Recurrent falls Likely multifactorial.  She does have baseline tremor and requires cane for ambulation but she was not able to find her cane yesterday started walking resulting in fall PT and OT eval-recommends SNF.  TOC aware and working on placement  Fall at home, initial Nutritional therapist. Modification of home and lifestyle to further prevent any falls.    Vitamin D deficiency Continue vitamin D  Metatarsal fracture Status post fall.  Pain control, foot is immobilized in the boot  L1 vertebral fracture (HCC) TLSO brace when OOB per neurosurgery.  Outpatient follow-up with neurosurgery in 2 to 3 weeks  Essential tremor Check TSH-pending   COPD (chronic obstructive pulmonary disease) (HCC) Stable. Tobacco cessation. PRN albuterol as deemed appropriate.  Scheduled Advair.   Tobacco use Counseling done by admitting hospitalist   GERD (gastroesophageal reflux disease) Continue PPI  Abnormal barium swallow Speech therapy evaluation finds nothing major.  Continue regular diet and PPI         Subjective: Feeling much better.  Husband at bedside  Physical Exam: Vitals:   05/28/22 0459 05/28/22 0833 05/28/22 2312 05/29/22 0832  BP:  (!) 129/94 122/70 (!) 142/69  Pulse:  (!) 110 90 86  Resp:  17 16 18  $ Temp:  98.1 F (36.7 C) 97.8 F (36.6 C) 98 F (36.7 C)  TempSrc:      SpO2:  99% 96% 99%  Weight: 59.1 kg     Height:       72 year old female lying in  the bed some pain Lungs clear to auscultation bilaterally Heart regular rate and rhythm Abdomen soft, benign Neuro alert and awake, nonfocal Extremities/musculoskeletal: Tenderness in the lower back and left foot. Data Reviewed:  There are no new results to review at this time.  Family Communication: Husband updated at bedside  Disposition: Status is: Inpatient Remains inpatient appropriate because: Medically stable, waiting for placement  Planned Discharge Destination: Skilled nursing facility   DVT prophylaxis-heparin Time spent: 35 minutes  Author: Max Sane, MD 05/29/2022 1:05 PM  For on call review [email or web address].

## 2022-05-29 NOTE — Assessment & Plan Note (Signed)
Continue vitamin D.  

## 2022-05-29 NOTE — Assessment & Plan Note (Signed)
Continue PPI ?

## 2022-05-29 NOTE — Consult Note (Signed)
Consultation Note Date: 05/29/2022   Patient Name: Alexandria Rodriguez  DOB: 1950-08-19  MRN: [id number]  Age / Sex: 72 y.o., female  PCP: Birdie Sons, MD Referring Physician: Max Sane, MD  Reason for Consultation: Establishing goals of care  HPI/Patient Profile: 72 y.o. female   admitted on 05/25/2022  status post fall with L1 vertebral fracture and subtle fracture of the base of the first metatarsal of left foot   2/16: Neurosurgery, PT and OT eval 2/17: TLSO brace when OOB, SNF recommended by PT and OT 2/18: Feeling nauseous  In discussion with patient and her husband this morning reported continued physical and functional decline over the past many months.  Clinical Assessment and Goals of Care:  This NP Wadie Lessen reviewed medical records, received report from team, assessed the patient and then meet at the patient's bedside along with her husband to discuss diagnosis, prognosis, GOC,  disposition and options.   Concept of Palliative Care was introduced as specialized medical care for people and their families living with serious illness.  If focuses on providing relief from the symptoms and stress of a serious illness.  The goal is to improve quality of life for both the patient and the family. Values and goals of care important to patient and family were attempted to be elicited.  Created space and opportunity for patient  and family to explore thoughts and feelings regarding current medical situation.  Ms. Marsland is tearful as she shares recognition of her continued physical and functional decline resulting in decreased independence.  She recognizes that she need strengthening and is open to SNF for short-term rehab hoping to regain strength and and therefore a more successful discharge home.  Both patient and her husband pleasantly care they are family story having been together for 27 years patient  has 1 son and her husband has 2 resulting in their blended family of 3 children.  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     A  discussion was had today regarding advanced directives.  Concepts specific to code status, artifical feeding and hydration, continued IV antibiotics and rehospitalization was had.    Education offered today regarding  the importance of continued conversation with family and their  medical providers regarding overall plan of care and treatment options,  ensuring decisions are within the context of the patients values and GOCs.  Plan of Care: -Full code -Patient is open to all offered and available medical interventions to treat the treatable and prolong life at this time -SNF for short-term rehab -Recommend outpatient palliative services once she is discharged facility/  home        Questions and concerns addressed.  Patient  encouraged to call with questions or concerns.     PMT will continue to support holistically.          No documented healthcare power of attorney or advanced care planning documents at this time.  Patient and husband are open to the assistance of  spiritual care to secure H POA and living well.  Husband is next of kin and main decision maker in the event she cannot make decisions for self       SUMMARY OF RECOMMENDATIONS    Code Status/Advance Care Planning: Full code   Symptom Management:  Pain:       Tylenol 1000 mg p.o. every 6 hours as needed,        Advil 400 mg p.o. every 6 hours as needed  Palliative Prophylaxis:  Frequent Pain Assessment  Additional Recommendations (Limitations, Scope, Preferences): Avoid Hospitalization  Psycho-social/Spiritual:  Desire for further Chaplaincy support:yes-advance care planning   Prognosis:  Unable to determine  Discharge Planning: [place] for rehab with Palliative care service follow-up      Primary Diagnoses: Present on  Admission:  Impaired functional mobility, balance, gait, and endurance  Essential tremor  COPD (chronic obstructive pulmonary disease) (HCC)  Tobacco use  L1 vertebral fracture (HCC)  Metatarsal fracture  Vitamin D deficiency  Abnormal barium swallow  GERD (gastroesophageal reflux disease)  Fall  Closed compression fracture of body of L1 vertebra ([place])   I have reviewed the medical record, interviewed the patient and family, and examined the patient. The following aspects are pertinent.  Past Medical History:  Diagnosis Date   Anxiety    Cataract    Cerebrovascular disease    Chronic pain    back pain   Contact dermatitis    COPD (chronic obstructive pulmonary disease) (HCC)    Depression    Diverticulosis 10/31/2010   On Tranverse colon   GERD (gastroesophageal reflux disease)    Humerus fracture 04/10/2016   Hyperlipidemia    Myasthenia gravis ([place]) 02/2013   Diagnosed by doctor Manuella Ghazi   Neuropathy    Osteoporosis    Stroke [facility])    Vitamin D deficiency    Social History   Socioeconomic History   Marital status: Married    Spouse name: Jaquelyn Bitter   Number of children: 3   Years of education: Not on file   Highest education level: Some college, no degree  Occupational History   Occupation: disabled  Tobacco Use   Smoking status: Every Day    Packs/day: 0.50    Years: 42.00    Total pack years: 21.00    Types: Cigarettes   Smokeless tobacco: Never   Tobacco comments:     Has been smoking for 30+ years, Cigarettes per day;10.  Vaping Use   Vaping Use: Never used  Substance and Sexual Activity   Alcohol use: No    Alcohol/week: 0.0 standard drinks of alcohol   Drug use: No   Sexual activity: Not on file  Other Topics Concern   Not on file  Social History Narrative   Ambulates with walker rollator or using assitance holding on to walls or solid surfaces.    Social Determinants of Health   Financial Resource Strain: Low Risk  (03/21/2022)   Overall  Financial Resource Strain (CARDIA)    Difficulty of Paying Living Expenses: Not hard at all  Food Insecurity: No Food Insecurity (05/25/2022)   Hunger Vital Sign    Worried About Running Out of Food in the Last Year: Never true    Ran Out of Food in the Last Year: Never true  Transportation Needs: No Transportation Needs (05/25/2022)   PRAPARE - Hydrologist (Medical): No    Lack of Transportation (Non-Medical): No  Physical Activity: Inactive (03/21/2022)  Exercise Vital Sign    Days of Exercise per Week: 0 days    Minutes of Exercise per Session: 0 min  Stress: No Stress Concern Present (03/21/2022)   Secretary    Feeling of Stress : Not at all  Social Connections: Socially Isolated (03/21/2022)   Social Connection and Isolation Panel [NHANES]    Frequency of Communication with Friends and Family: Once a week    Frequency of Social Gatherings with Friends and Family: Never    Attends Religious Services: Never    Marine scientist or Organizations: No    Attends Music therapist: Never    Marital Status: Married   Family History  Problem Relation Age of Onset   Colon cancer Mother    Stroke Father    Cancer Sister        Ovarian, stable   Breast cancer Maternal Grandmother    Scheduled Meds:  heparin  5,000 Units Subcutaneous Q12H   nicotine  14 mg Transdermal Daily   pantoprazole  40 mg Oral Daily   sodium chloride flush  3 mL Intravenous Q12H   Continuous Infusions: PRN Meds:.acetaminophen, ibuprofen, morphine injection, ondansetron (ZOFRAN) IV Medications Prior to Admission:  Prior to Admission medications   Medication Sig Start Date End Date Taking? Authorizing Provider  carisoprodol (SOMA) 350 MG tablet TAKE 1 TABLET BY MOUTH TWICE DAILY AS NEEDED Patient taking differently: Take 350 mg by mouth 2 (two) times daily. 04/17/22  Yes Birdie Sons, MD   Cholecalciferol (VITAMIN D3) 50 MCG (2000 UT) CAPS Take 2 capsules (4,000 Units total) by mouth daily. 05/28/20  Yes Birdie Sons, MD  clopidogrel (PLAVIX) 75 MG tablet Take 1 tablet (75 mg total) by mouth daily. 06/19/21  Yes Birdie Sons, MD  DULoxetine (CYMBALTA) 20 MG capsule Take 1 capsule (20 mg total) by mouth daily. 05/01/22  Yes Birdie Sons, MD  fluticasone (FLONASE) 50 MCG/ACT nasal spray Place 2 sprays into both nostrils daily. 05/28/20  Yes Birdie Sons, MD  fluticasone-salmeterol (ADVAIR) 250-50 MCG/ACT AEPB Inhale 1 puff into the lungs in the morning and at bedtime. 11/09/21  Yes Birdie Sons, MD  gabapentin (NEURONTIN) 100 MG capsule TAKE 2 CAPSULES(200 MG) BY MOUTH THREE TIMES DAILY IN ADDITION TO 600 MG GABAPENTIN TABLETS 03/29/22  Yes Birdie Sons, MD  gabapentin (NEURONTIN) 600 MG tablet TAKE 1 TABLET(600 MG) BY MOUTH TWICE DAILY 08/15/21  Yes Birdie Sons, MD  lansoprazole (PREVACID) 30 MG capsule TAKE 1 CAPSULE(30 MG) BY MOUTH DAILY 02/26/22  Yes Birdie Sons, MD  mupirocin ointment (BACTROBAN) 2 % Apply 1 Application topically 2 (two) times daily. 05/16/22  Yes [name], PA-C  nortriptyline (PAMELOR) 50 MG capsule TAKE 1 CAPSULE(50 MG) BY MOUTH AT BEDTIME 06/01/21  Yes Birdie Sons, MD  nystatin (MYCOSTATIN/NYSTOP) powder Apply 1 Application topically 3 (three) times daily. 05/16/22  Yes [name], PA-C  rosuvastatin (CRESTOR) 20 MG tablet Take 20 mg by mouth daily.   Yes [provider]  triamcinolone ointment (KENALOG) 0.1 % APPLY EXTERNALLY TO THE AFFECTED AREA TWICE DAILY AS NEEDED 05/30/21  Yes Birdie Sons, MD  atorvastatin (LIPITOR) 20 MG tablet Take 1 tablet (20 mg total) by mouth daily. Patient not taking: Reported on 05/28/2022 03/29/22   Birdie Sons, MD  ENSTILAR 0.005-0.064 % FOAM Apply  a small amount to skin as directed  qd up to 5 days  per week to aa psoriasis on body, avoid face, groin, axilla Patient not  taking: Reported on 05/28/2022 05/22/19   [provider]  Na Sulfate-K Sulfate-Mg Sulf 17.5-3.13-1.6 GM/177ML SOLN MIX AND DRINK AS DIRECTED Patient not taking: Reported on 05/16/2022 04/15/22   [provider]  prochlorperazine (COMPAZINE) 10 MG tablet Take 1 tablet (10 mg total) by mouth every 6 (six) hours as needed for nausea or vomiting (or headache). Patient not taking: Reported on 05/28/2022 11/16/20   Blake Divine, MD  Vitamin D, Ergocalciferol, (DRISDOL) 1.25 MG (50000 UNIT) CAPS capsule Take 1 capsule (50,000 Units total) by mouth every 7 (seven) days. Patient not taking: Reported on 05/28/2022 04/05/21   Birdie Sons, MD   Allergies  Allergen Reactions   Effexor Xr [Venlafaxine Hcl Er] Other (See Comments)    Reaction:  GI upset    Fosamax [Alendronate Sodium] Nausea And Vomiting   Percocet [Oxycodone-Acetaminophen] Swelling   Erythromycin Swelling and Rash    Pt states that her tongue swells.     Lidoderm [Lidocaine] Swelling and Rash   Morphine And Related Swelling, Rash and Other (See Comments)    Pt states that her tongue swells.   Pt states that her tongue swells.     Sulfa Antibiotics Swelling and Rash    TONGUE SWELLING   Sulfasalazine Rash and Swelling   Tramadol Swelling and Rash   Review of Systems  Constitutional:  Positive for fatigue.  Neurological:  Positive for weakness.    Physical Exam Cardiovascular:     Rate and Rhythm: Normal rate.  Musculoskeletal:     Comments: Generalized weakness and muscle atrophy  Skin:    General: Skin is warm and dry.  Neurological:     Mental Status: She is alert and oriented to person, place, and time.     Vital Signs: BP (!) 142/69 (BP Location: Left Arm)   Pulse 86   Temp 98 F (36.7 C)   Resp 18   Ht '5\' 2"'$  (1.575 m)   Wt 59.1 kg   SpO2 99%   BMI 23.83 kg/m  Pain Scale: 0-10   Pain Score: Asleep   SpO2: SpO2: 99 % O2 Device:SpO2: 99 % O2 Flow Rate: .   IO: Intake/output summary:   Intake/Output Summary (Last 24 hours) at 05/29/2022 [id number] Last data filed at 05/28/2022 2312 Gross per 24 hour  Intake --  Output 0 ml  Net 0 ml    LBM: Last BM Date : 05/28/22 Baseline Weight: Weight: 63 kg Most recent weight: Weight: 59.1 kg     Palliative Assessment/Data: 50%   Discussed with Dr. Manuella Ghazi and transition of care team  Time In: 0800 Time Out: 0915 Time Total: 75 mniutes Greater than 50%  of this time was spent counseling and coordinating care related to the above assessment and plan.  Signed by: Wadie Lessen, NP   Please contact Palliative Medicine Team phone at [phone] for questions and concerns.  For individual provider: See Shea Evans

## 2022-05-29 NOTE — Progress Notes (Signed)
   05/29/22 1100  Spiritual Encounters  Type of Visit Initial  Care provided to: Patient  Referral source Nurse (RN/NT/LPN)  Reason for visit Routine spiritual support  OnCall Visit No  Spiritual Framework  Presenting Themes Coping tools  Community/Connection None  Patient Stress Factors Lack of caregivers;Health changes;Lack of knowledge  Interventions  Spiritual Care Interventions Made Established relationship of care and support;Compassionate presence;Reflective listening;Normalization of emotions;Explored ethical dilemma;Encouragement  Intervention Outcomes  Outcomes Reduced anxiety  Spiritual Care Plan  Spiritual Care Issues Still Outstanding Chaplain will continue to follow   Spoke with patient have concerns of where she is going to be place. Patient is not willing to travel with EMS to the location that her health team has provided for her. Patient is weary of going to a place and being mistreated because of past experiences she has had before. Try to help patient deal with her anxiety regarding having to go to a rehab center.

## 2022-05-29 NOTE — Care Management Important Message (Signed)
Important Message  Patient Details  Name: Alexandria Rodriguez MRN: [id number] Date of Birth: [birth year]   Medicare Important Message Given:  N/A - LOS <3 / Initial given by admissions     Juliann Pulse A Lam Bjorklund 05/29/2022, 7:40 AM

## 2022-05-29 NOTE — Assessment & Plan Note (Signed)
Likely multifactorial.  She does have baseline tremor and requires cane for ambulation but she was not able to find her cane yesterday started walking resulting in fall PT and OT eval-recommends SNF.  TOC aware and working on placement

## 2022-05-29 NOTE — Assessment & Plan Note (Signed)
TLSO brace when OOB per neurosurgery.  Outpatient follow-up with neurosurgery in 2 to 3 weeks

## 2022-05-29 NOTE — Assessment & Plan Note (Signed)
Counseling done by admitting hospitalist

## 2022-05-29 NOTE — Assessment & Plan Note (Signed)
Check TSH-pending

## 2022-05-29 NOTE — Assessment & Plan Note (Signed)
Stable. Tobacco cessation. PRN albuterol as deemed appropriate.  Scheduled Advair.

## 2022-05-29 NOTE — Assessment & Plan Note (Signed)
Status post fall.  Pain control, foot is immobilized in the boot

## 2022-05-29 NOTE — TOC Progression Note (Signed)
Transition of Care [facility]) - Progression Note    Patient Details  Name: Alexandria Rodriguez MRN: [id number] Date of Birth: 23-Dec-1950  [facility] CM/SW Contact  Laurena Slimmer, RN Phone Number: 05/29/2022, 3:57 PM  Clinical Narrative:    Spoke with patient and her husband at bedisde regarding SNF and preferences. They were provided a list of SNF. They are not sure of which facility they want but are sure they would not want [facility]. They have been advised facility bed offering is up to the facility and would require insurance approval. They are agreeable to bed search.  Bed search started.         Expected Discharge Plan and Services                                               Social Determinants of Health (SDOH) Interventions SDOH Screenings   Food Insecurity: No Food Insecurity (05/25/2022)  Housing: Low Risk  (05/25/2022)  Transportation Needs: No Transportation Needs (05/25/2022)  Utilities: Not At Risk (05/25/2022)  Alcohol Screen: Low Risk  (03/21/2022)  Depression (PHQ2-9): High Risk (05/01/2022)  Financial Resource Strain: Low Risk  (03/21/2022)  Physical Activity: Inactive (03/21/2022)  Social Connections: Socially Isolated (03/21/2022)  Stress: No Stress Concern Present (03/21/2022)  Tobacco Use: High Risk (05/26/2022)    Readmission Risk Interventions     No data to display

## 2022-05-29 NOTE — Progress Notes (Signed)
Occupational Therapy Treatment Patient Details Name: Alexandria Rodriguez MRN: [id number] DOB: 1950/06/13 Today's Date: 05/29/2022   History of present illness Alexandria Rodriguez is a 72 y.o. female with a history of chronic back pain, COPD, osteoporosis, stroke, HLD, GERD. Pt seen in ED today for fall on right side with her left foot underneath.  Patient is being followed by neurology for baseline tremor and history of Parkinson's. Found to have L1 compression fracture treated conservatively with TLSO; fracture of the first metatarsal left foot.   OT comments  Alexandria Rodriguez was seen for OT treatment on this date. Upon arrival to room pt reclined in bed, agreeable to tx. Pt requires MIN A + RW bed>chair SPT, assist for stabilizing RW only. SETUP don R shoe, MIN A don L CAM boot at bed level. MOD A don TLSO seated EOB. Educated on falls prevention as pt continues to state furniture walking is safe at home. Pt making good progress toward goals, will continue to follow POC. Discharge recommendation remains appropriate.     Recommendations for follow up therapy are one component of a multi-disciplinary discharge planning process, led by the attending physician.  Recommendations may be updated based on patient status, additional functional criteria and insurance authorization.    Follow Up Recommendations  Skilled nursing-short term rehab (<3 hours/day)     Assistance Recommended at Discharge Frequent or constant Supervision/Assistance  Patient can return home with the following  A lot of help with walking and/or transfers;A lot of help with bathing/dressing/bathroom;Help with stairs or ramp for entrance   Equipment Recommendations  BSC/3in1    Recommendations for Other Services      Precautions / Restrictions Precautions Precautions: Fall Precaution Comments: cam boot L LE Required Braces or Orthoses: Spinal Brace Spinal Brace: Thoracolumbosacral orthotic Restrictions Weight Bearing Restrictions: No        Mobility Bed Mobility Overal bed mobility: Needs Assistance Bed Mobility: Rolling, Sidelying to Sit Rolling: Supervision Sidelying to sit: Supervision            Transfers Overall transfer level: Needs assistance Equipment used: Rolling walker (2 wheels) Transfers: Sit to/from Stand Sit to Stand: Min assist                 Balance Overall balance assessment: Needs assistance Sitting-balance support: Feet supported Sitting balance-Leahy Scale: Good     Standing balance support: Bilateral upper extremity supported, During functional activity, Reliant on assistive device for balance Standing balance-Leahy Scale: Fair                             ADL either performed or assessed with clinical judgement   ADL Overall ADL's : Needs assistance/impaired                                       General ADL Comments: MIN A + RW simulated BSC t/f, assist for stabilizing RW only. SETUP don R shoe, MIN A don L CAM boot at bed level. MOD A don TLSO seated EOB      Cognition Arousal/Alertness: Awake/alert Behavior During Therapy: WFL for tasks assessed/performed Overall Cognitive Status: Within Functional Limits for tasks assessed  Pertinent Vitals/ Pain       Pain Assessment Pain Assessment: No/denies pain Pain Intervention(s): Premedicated before session   Frequency  Min 2X/week        Progress Toward Goals  OT Goals(current goals can now be found in the care plan section)  Progress towards OT goals: Progressing toward goals  Acute Rehab OT Goals Patient Stated Goal: to go home OT Goal Formulation: With patient/family Time For Goal Achievement: 06/09/22 Potential to Achieve Goals: Good ADL Goals Pt Will Perform Grooming: standing;with min assist Pt Will Perform Lower Body Dressing: with min assist;sit to/from stand Pt Will Transfer to Toilet: with  min assist;ambulating;regular height toilet  Plan Discharge plan remains appropriate;Frequency remains appropriate    Co-evaluation                 AM-PAC OT "6 Clicks" Daily Activity     Outcome Measure   Help from another person eating meals?: None Help from another person taking care of personal grooming?: A Little Help from another person toileting, which includes using toliet, bedpan, or urinal?: A Lot Help from another person bathing (including washing, rinsing, drying)?: A Lot Help from another person to put on and taking off regular upper body clothing?: A Lot Help from another person to put on and taking off regular lower body clothing?: A Little 6 Click Score: 16    End of Session Equipment Utilized During Treatment: Rolling walker (2 wheels)  OT Visit Diagnosis: Muscle weakness (generalized) (M62.81);Other abnormalities of gait and mobility (R26.89)   Activity Tolerance Patient tolerated treatment well   Patient Left in chair;with call bell/phone within reach;with family/visitor present   Nurse Communication          Time: [id number] OT Time Calculation (min): 14 min  Charges: OT General Charges $OT Visit: 1 Visit OT Treatments $Self Care/Home Management : 8-22 mins  [name], M.S. OTR/L  05/29/22, 10:13 AM  ascom [phone]

## 2022-05-30 ENCOUNTER — Ambulatory Visit: Payer: Medicare Other | Attending: Family Medicine

## 2022-05-30 DIAGNOSIS — S32019G Unspecified fracture of first lumbar vertebra, subsequent encounter for fracture with delayed healing: Secondary | ICD-10-CM | POA: Diagnosis not present

## 2022-05-30 DIAGNOSIS — J449 Chronic obstructive pulmonary disease, unspecified: Secondary | ICD-10-CM | POA: Diagnosis not present

## 2022-05-30 DIAGNOSIS — R2681 Unsteadiness on feet: Secondary | ICD-10-CM | POA: Diagnosis not present

## 2022-05-30 DIAGNOSIS — Z736 Limitation of activities due to disability: Secondary | ICD-10-CM | POA: Diagnosis not present

## 2022-05-30 DIAGNOSIS — S32019D Unspecified fracture of first lumbar vertebra, subsequent encounter for fracture with routine healing: Secondary | ICD-10-CM | POA: Diagnosis not present

## 2022-05-30 DIAGNOSIS — G250 Essential tremor: Secondary | ICD-10-CM | POA: Diagnosis not present

## 2022-05-30 DIAGNOSIS — S92315D Nondisplaced fracture of first metatarsal bone, left foot, subsequent encounter for fracture with routine healing: Secondary | ICD-10-CM | POA: Diagnosis not present

## 2022-05-30 DIAGNOSIS — S92315A Nondisplaced fracture of first metatarsal bone, left foot, initial encounter for closed fracture: Secondary | ICD-10-CM | POA: Diagnosis not present

## 2022-05-30 DIAGNOSIS — G629 Polyneuropathy, unspecified: Secondary | ICD-10-CM | POA: Diagnosis not present

## 2022-05-30 DIAGNOSIS — E559 Vitamin D deficiency, unspecified: Secondary | ICD-10-CM | POA: Diagnosis not present

## 2022-05-30 DIAGNOSIS — S32010A Wedge compression fracture of first lumbar vertebra, initial encounter for closed fracture: Secondary | ICD-10-CM | POA: Diagnosis not present

## 2022-05-30 DIAGNOSIS — Y92009 Unspecified place in unspecified non-institutional (private) residence as the place of occurrence of the external cause: Secondary | ICD-10-CM | POA: Diagnosis not present

## 2022-05-30 DIAGNOSIS — Z79899 Other long term (current) drug therapy: Secondary | ICD-10-CM | POA: Diagnosis not present

## 2022-05-30 DIAGNOSIS — K219 Gastro-esophageal reflux disease without esophagitis: Secondary | ICD-10-CM | POA: Diagnosis not present

## 2022-05-30 DIAGNOSIS — R251 Tremor, unspecified: Secondary | ICD-10-CM | POA: Diagnosis not present

## 2022-05-30 DIAGNOSIS — I700 Atherosclerosis of aorta: Secondary | ICD-10-CM | POA: Diagnosis not present

## 2022-05-30 DIAGNOSIS — F32A Depression, unspecified: Secondary | ICD-10-CM | POA: Diagnosis not present

## 2022-05-30 DIAGNOSIS — M6259 Muscle wasting and atrophy, not elsewhere classified, multiple sites: Secondary | ICD-10-CM | POA: Diagnosis not present

## 2022-05-30 DIAGNOSIS — I10 Essential (primary) hypertension: Secondary | ICD-10-CM | POA: Diagnosis not present

## 2022-05-30 DIAGNOSIS — M6281 Muscle weakness (generalized): Secondary | ICD-10-CM | POA: Diagnosis not present

## 2022-05-30 DIAGNOSIS — I679 Cerebrovascular disease, unspecified: Secondary | ICD-10-CM | POA: Diagnosis not present

## 2022-05-30 DIAGNOSIS — R296 Repeated falls: Secondary | ICD-10-CM | POA: Diagnosis not present

## 2022-05-30 DIAGNOSIS — W19XXXA Unspecified fall, initial encounter: Secondary | ICD-10-CM | POA: Diagnosis not present

## 2022-05-30 DIAGNOSIS — S92316S Nondisplaced fracture of first metatarsal bone, unspecified foot, sequela: Secondary | ICD-10-CM | POA: Diagnosis not present

## 2022-05-30 LAB — THYROID PANEL WITH TSH
Free Thyroxine Index: 2.6 (ref 1.2–4.9)
T3 Uptake Ratio: 22 % — ABNORMAL LOW (ref 24–39)
T4, Total: 11.6 ug/dL (ref 4.5–12.0)
TSH: 7.43 u[IU]/mL — ABNORMAL HIGH (ref 0.450–4.500)

## 2022-05-30 MED ORDER — IBUPROFEN 400 MG PO TABS: 400.0000 mg | ORAL_TABLET | Freq: Four times a day (QID) | ORAL | 0 refills | Status: AC | PRN

## 2022-05-30 NOTE — Plan of Care (Signed)

## 2022-05-30 NOTE — TOC Transition Note (Addendum)
Transition of Care [facility]) - CM/SW Discharge Note   Patient Details  Name: Alexandria Rodriguez MRN: [id number] Date of Birth: Jul 20, 1950  Transition of Care [facility] CM/SW Contact:  Laurena Slimmer, RN Phone Number: 05/30/2022, 11:10 AM   Clinical Narrative:    Discharge received.  Spoke with Gena at [facility] to confirm discharge for today Patient assigned to Room [phone] Nurse will call report to [phone] Patient notified Patient stated her husband will take her to the facility Discharge summary sent via [place] signing off.      Final next level of care: [place] Barriers to Discharge: Barriers Resolved   Patient Goals and CMS Choice      Discharge Placement                Patient chooses bed at: [facility] [place] Patient to be transferred to facility by: [facility] Name of family member notified: Spouse, Earnest Patient and family notified of of transfer: 05/30/22  Discharge Plan and Services Additional resources added to the After Visit Summary for                                       Social Determinants of Health (SDOH) Interventions SDOH Screenings   Food Insecurity: No Food Insecurity (05/25/2022)  Housing: Low Risk  (05/25/2022)  Transportation Needs: No Transportation Needs (05/25/2022)  Utilities: Not At Risk (05/25/2022)  Alcohol Screen: Low Risk  (03/21/2022)  Depression (PHQ2-9): High Risk (05/01/2022)  Financial Resource Strain: Low Risk  (03/21/2022)  Physical Activity: Inactive (03/21/2022)  Social Connections: Socially Isolated (03/21/2022)  Stress: No Stress Concern Present (03/21/2022)  Tobacco Use: High Risk (05/26/2022)     Readmission Risk Interventions     No data to display

## 2022-05-30 NOTE — Plan of Care (Signed)
  Problem: Education: Goal: Knowledge of General Education information will improve Description: Including pain rating scale, medication(s)/side effects and non-pharmacologic comfort measures 05/30/2022 1357 by Verl Dicker, RN Outcome: Adequate for Discharge 05/30/2022 1049 by Verl Dicker, RN Outcome: Progressing   Problem: Health Behavior/Discharge Planning: Goal: Ability to manage health-related needs will improve 05/30/2022 1357 by Verl Dicker, RN Outcome: Adequate for Discharge 05/30/2022 1049 by Verl Dicker, RN Outcome: Progressing   Problem: Clinical Measurements: Goal: Ability to maintain clinical measurements within normal limits will improve 05/30/2022 1357 by Verl Dicker, RN Outcome: Adequate for Discharge 05/30/2022 1049 by Verl Dicker, RN Outcome: Progressing Goal: Will remain free from infection 05/30/2022 1357 by Verl Dicker, RN Outcome: Adequate for Discharge 05/30/2022 1049 by Verl Dicker, RN Outcome: Progressing Goal: Diagnostic test results will improve 05/30/2022 1357 by Verl Dicker, RN Outcome: Adequate for Discharge 05/30/2022 1049 by Verl Dicker, RN Outcome: Progressing Goal: Respiratory complications will improve 05/30/2022 1357 by Verl Dicker, RN Outcome: Adequate for Discharge 05/30/2022 1049 by Verl Dicker, RN Outcome: Progressing Goal: Cardiovascular complication will be avoided 05/30/2022 1357 by Verl Dicker, RN Outcome: Adequate for Discharge 05/30/2022 1049 by Verl Dicker, RN Outcome: Progressing   Problem: Activity: Goal: Risk for activity intolerance will decrease 05/30/2022 1357 by Verl Dicker, RN Outcome: Adequate for Discharge 05/30/2022 1049 by Verl Dicker, RN Outcome: Progressing   Problem: Nutrition: Goal: Adequate nutrition will be maintained 05/30/2022 1357 by Verl Dicker, RN Outcome: Adequate for Discharge 05/30/2022 1049 by Verl Dicker, RN Outcome: Progressing   Problem: Coping: Goal: Level of anxiety will decrease 05/30/2022 1357 by Verl Dicker, RN Outcome: Adequate for Discharge 05/30/2022 1049 by Verl Dicker, RN Outcome: Progressing   Problem: Elimination: Goal: Will not experience complications related to bowel motility 05/30/2022 1357 by Verl Dicker, RN Outcome: Adequate for Discharge 05/30/2022 1049 by Verl Dicker, RN Outcome: Progressing Goal: Will not experience complications related to urinary retention 05/30/2022 1357 by Verl Dicker, RN Outcome: Adequate for Discharge 05/30/2022 1049 by Verl Dicker, RN Outcome: Progressing   Problem: Pain Managment: Goal: General experience of comfort will improve 05/30/2022 1357 by Verl Dicker, RN Outcome: Adequate for Discharge 05/30/2022 1049 by Verl Dicker, RN Outcome: Progressing   Problem: Safety: Goal: Ability to remain free from injury will improve 05/30/2022 1357 by Verl Dicker, RN Outcome: Adequate for Discharge 05/30/2022 1049 by Verl Dicker, RN Outcome: Progressing   Problem: Skin Integrity: Goal: Risk for impaired skin integrity will decrease 05/30/2022 1357 by Verl Dicker, RN Outcome: Adequate for Discharge 05/30/2022 1049 by Verl Dicker, RN Outcome: Progressing

## 2022-05-30 NOTE — Plan of Care (Signed)

## 2022-05-30 NOTE — Progress Notes (Signed)
Occupational Therapy Treatment Patient Details Name: Alexandria Rodriguez MRN: [id number] DOB: 04-Oct-1950 Today's Date: 05/30/2022   History of present illness Alexandria Rodriguez is a 72 y.o. female with a history of chronic back pain, COPD, osteoporosis, stroke, HLD, GERD. Pt seen in ED today for fall on right side with her left foot underneath.  Patient is being followed by neurology for baseline tremor and history of Parkinson's. Found to have L1 compression fracture treated conservatively with TLSO; fracture of the first metatarsal left foot.   OT comments  Ms Alexandria Rodriguez was seen for OT treatment on this date. Upon arrival to room pt reclined in bed, agreeable to tx. Pt requires  MIN A + RW toilet t/f, assist for stabilizing RW and minor LOBs with turns. MIN A pericare standing, assist for balance and thoroughness. SETUP don R shoe, MOD A don TLSO seated EOB. Pt making good progress toward goals, will continue to follow POC. Discharge recommendation remains appropriate.     Recommendations for follow up therapy are one component of a multi-disciplinary discharge planning process, led by the attending physician.  Recommendations may be updated based on patient status, additional functional criteria and insurance authorization.    Follow Up Recommendations  Skilled nursing-short term rehab (<3 hours/day)     Assistance Recommended at Discharge Frequent or constant Supervision/Assistance  Patient can return home with the following  A lot of help with walking and/or transfers;A lot of help with bathing/dressing/bathroom;Help with stairs or ramp for entrance   Equipment Recommendations  BSC/3in1    Recommendations for Other Services      Precautions / Restrictions Precautions Precautions: Fall Precaution Comments: cam boot L LE Required Braces or Orthoses: Spinal Brace Spinal Brace: Thoracolumbosacral orthotic Restrictions Weight Bearing Restrictions: No       Mobility Bed Mobility Overal bed  mobility: Needs Assistance Bed Mobility: Rolling, Sidelying to Sit Rolling: Supervision Sidelying to sit: Supervision     Sit to sidelying: Min guard      Transfers Overall transfer level: Needs assistance Equipment used: Rolling walker (2 wheels) Transfers: Sit to/from Stand Sit to Stand: Min assist                 Balance Overall balance assessment: Needs assistance Sitting-balance support: Feet supported Sitting balance-Leahy Scale: Good     Standing balance support: During functional activity, Single extremity supported Standing balance-Leahy Scale: Fair                             ADL either performed or assessed with clinical judgement   ADL Overall ADL's : Needs assistance/impaired                                       General ADL Comments: MIN A + RW toilet t/f, assist for stabilizing RW and minor LOBs with turns. MIN A pericare standing, assist for balance and thoroughness. SETUP don R shoe, MOD A don TLSO seated EOB.      Cognition Arousal/Alertness: Awake/alert Behavior During Therapy: WFL for tasks assessed/performed Overall Cognitive Status: Within Functional Limits for tasks assessed                                           Pertinent Vitals/ Pain  Pain Assessment Pain Assessment: No/denies pain   Frequency  Min 2X/week        Progress Toward Goals  OT Goals(current goals can now be found in the care plan section)  Progress towards OT goals: Progressing toward goals  Acute Rehab OT Goals Patient Stated Goal: to go home OT Goal Formulation: With patient/family Time For Goal Achievement: 06/09/22 Potential to Achieve Goals: Good ADL Goals Pt Will Perform Grooming: standing;with min assist Pt Will Perform Lower Body Dressing: with min assist;sit to/from stand Pt Will Transfer to Toilet: with min assist;ambulating;regular height toilet  Plan Discharge plan remains  appropriate;Frequency remains appropriate    Co-evaluation                 AM-PAC OT "6 Clicks" Daily Activity     Outcome Measure   Help from another person eating meals?: None Help from another person taking care of personal grooming?: A Little Help from another person toileting, which includes using toliet, bedpan, or urinal?: A Little Help from another person bathing (including washing, rinsing, drying)?: A Lot Help from another person to put on and taking off regular upper body clothing?: A Lot Help from another person to put on and taking off regular lower body clothing?: A Little 6 Click Score: 17    End of Session    OT Visit Diagnosis: Muscle weakness (generalized) (M62.81);Other abnormalities of gait and mobility (R26.89)   Activity Tolerance Patient tolerated treatment well   Patient Left in bed;with call bell/phone within reach;with bed alarm set;with family/visitor present   Nurse Communication          Time: [id number] OT Time Calculation (min): 13 min  Charges: OT General Charges $OT Visit: 1 Visit OT Treatments $Self Care/Home Management : 8-22 mins  [name], M.S. OTR/L  05/30/22, 10:43 AM  ascom [phone]

## 2022-05-30 NOTE — Discharge Summary (Signed)
Physician Discharge Summary   Patient: Alexandria Rodriguez MRN: [id number] DOB: 08/17/1950  Admit date:     05/25/2022  Discharge date: 05/30/22  Discharge Physician: Max Sane   PCP: Birdie Sons, MD   Recommendations at discharge:    F/up with outpt providers as requested  Discharge Diagnoses: Principal Problem:   Recurrent falls Active Problems:   Vitamin D deficiency   Fall at home, initial encounter   Metatarsal fracture   Impaired functional mobility, balance, gait, and endurance   L1 vertebral fracture (HCC)   Essential tremor   COPD (chronic obstructive pulmonary disease) (HCC)   Tobacco use   Abnormal barium swallow   GERD (gastroesophageal reflux disease)   Fall   Closed compression fracture of body of L1 vertebra [facility])  Hospital Course: 72 year old female admitted status post fall with L1 vertebral fracture and subtle fracture of the base of the first metatarsal of left foot  2/16: Neurosurgery, PT and OT eval 2/17: TLSO brace when OOB, SNF recommended by PT and OT 2/18: Feeling nauseous 2/19: Stopping morphine for discharge planning  Assessment and Plan: * Recurrent falls Likely multifactorial.  She does have baseline tremor and requires cane for ambulation but she was not able to find her cane yesterday started walking resulting in fall PT and OT eval-recommends SNF where she is being D/Ced  Fall at home, initial encounter Gait strengthening at SNF  Vitamin D deficiency Continue vitamin D  Metatarsal fracture Status post fall.  Pain control, foot is immobilized in the boot  L1 vertebral fracture (HCC) TLSO brace when OOB per neurosurgery.  Outpatient follow-up with neurosurgery in 2 to 3 weeks  Essential tremor Follows with neuro at [facility]  COPD (chronic obstructive pulmonary disease) (HCC) Stable.  Tobacco use Counseling done   GERD (gastroesophageal reflux disease) Continue PPI        Consultants: Neurosurgery Disposition: Skilled  nursing facility Diet recommendation:  Discharge Diet Orders (From admission, onward)     Start     Ordered   05/30/22 0000  Diet - low sodium heart healthy        05/30/22 0931           Carb modified diet DISCHARGE MEDICATION: Allergies as of 05/30/2022       Reactions   Effexor Xr [venlafaxine Hcl Er] Other (See Comments)   Reaction:  GI upset    Fosamax [alendronate Sodium] Nausea And Vomiting   Percocet [oxycodone-acetaminophen] Swelling   Erythromycin Swelling, Rash   Pt states that her tongue swells.     Lidoderm [lidocaine] Swelling, Rash   Morphine And Related Swelling, Rash, Other (See Comments)   Pt states that her tongue swells.   Pt states that her tongue swells.     Sulfa Antibiotics Swelling, Rash   TONGUE SWELLING   Sulfasalazine Rash, Swelling   Tramadol Swelling, Rash        Medication List     STOP taking these medications    atorvastatin 20 MG tablet Commonly known as: LIPITOR   Enstilar 0.005-0.064 % Foam Generic drug: Calcipotriene-Betameth Diprop   Na Sulfate-K Sulfate-Mg Sulf 17.5-3.13-1.6 GM/177ML Soln   prochlorperazine 10 MG tablet Commonly known as: COMPAZINE   Vitamin D (Ergocalciferol) 1.25 MG (50000 UNIT) Caps capsule Commonly known as: DRISDOL       TAKE these medications    carisoprodol 350 MG tablet Commonly known as: SOMA TAKE 1 TABLET BY MOUTH TWICE DAILY AS NEEDED What changed: when to take this  clopidogrel 75 MG tablet Commonly known as: PLAVIX Take 1 tablet (75 mg total) by mouth daily.   DULoxetine 20 MG capsule Commonly known as: Cymbalta Take 1 capsule (20 mg total) by mouth daily.   fluticasone 50 MCG/ACT nasal spray Commonly known as: FLONASE Place 2 sprays into both nostrils daily.   fluticasone-salmeterol 250-50 MCG/ACT Aepb Commonly known as: ADVAIR Inhale 1 puff into the lungs in the morning and at bedtime.   gabapentin 600 MG tablet Commonly known as: NEURONTIN TAKE 1 TABLET(600 MG) BY  MOUTH TWICE DAILY   gabapentin 100 MG capsule Commonly known as: NEURONTIN TAKE 2 CAPSULES(200 MG) BY MOUTH THREE TIMES DAILY IN ADDITION TO 600 MG GABAPENTIN TABLETS   ibuprofen 400 MG tablet Commonly known as: ADVIL Take 1 tablet (400 mg total) by mouth every 6 (six) hours as needed for headache, moderate pain, cramping or mild pain.   lansoprazole 30 MG capsule Commonly known as: PREVACID TAKE 1 CAPSULE(30 MG) BY MOUTH DAILY   mupirocin ointment 2 % Commonly known as: BACTROBAN Apply 1 Application topically 2 (two) times daily.   nortriptyline 50 MG capsule Commonly known as: PAMELOR TAKE 1 CAPSULE(50 MG) BY MOUTH AT BEDTIME   nystatin powder Commonly known as: MYCOSTATIN/NYSTOP Apply 1 Application topically 3 (three) times daily.   rosuvastatin 20 MG tablet Commonly known as: CRESTOR Take 20 mg by mouth daily.   triamcinolone ointment 0.1 % Commonly known as: KENALOG APPLY EXTERNALLY TO THE AFFECTED AREA TWICE DAILY AS NEEDED   vitamin D3 50 MCG (2000 UT) Caps Take 2 capsules (4,000 Units total) by mouth daily.               Durable Medical Equipment  (From admission, onward)           Start     Ordered   05/26/22 1440  For home use only DME 3 n 1  Once        05/26/22 1439            Follow-up Information     Loleta Dicker, PA Follow up on 06/13/2022.   Specialty: Neurosurgery Contact information: [place] [address] [phone]                Discharge Exam: Danley Danker Weights   05/25/22 1831 05/27/22 0500 05/28/22 0459  Weight: 63 kg 59.2 kg 38.21 kg   72 year old female lying in the bed some pain Lungs clear to auscultation bilaterally Heart regular rate and rhythm Abdomen soft, benign Neuro alert and awake, nonfocal  Condition at discharge: fair  The results of significant diagnostics from this hospitalization (including imaging, microbiology, ancillary and laboratory) are listed below for  reference.   Imaging Studies: DG Lumbar Spine 2-3 Views  Result Date: 05/26/2022 CLINICAL DATA:  L1 vertebral fracture EXAM: LUMBAR SPINE - 2-3 VIEW COMPARISON:  CT lumbar spine dated 05/25/2022 FINDINGS: Five lumbar-type vertebral bodies. Normal lumbar lordosis. Stable superior endplate compression fracture deformity at L1 with approximately 10% loss of height. No retropulsion. IMPRESSION: Stable superior endplate compression fracture deformity at L1 with approximately 10% loss of height. No retropulsion. Electronically Signed   By: Julian Hy M.D.   On: 05/26/2022 11:24   CT Cervical Spine Wo Contrast  Result Date: 05/25/2022 CLINICAL DATA:  Status post fall. EXAM: CT CERVICAL SPINE WITHOUT CONTRAST TECHNIQUE: Multidetector CT imaging of the cervical spine was performed without intravenous contrast. Multiplanar CT image reconstructions were also generated. RADIATION DOSE REDUCTION: This  exam was performed according to the departmental dose-optimization program which includes automated exposure control, adjustment of the mA and/or kV according to patient size and/or use of iterative reconstruction technique. COMPARISON:  None Available. FINDINGS: Alignment: Normal. Skull base and vertebrae: No acute fracture. No primary bone lesion or focal pathologic process. Soft tissues and spinal canal: No prevertebral fluid or swelling. No visible canal hematoma. Disc levels: Mild anterior osteophyte formation is seen at the level of C5-C6 with mild multilevel endplate sclerosis is seen throughout the remainder of the cervical spine. There is marked severity narrowing of the anterior atlantoaxial articulation. Fusion of the C6-C7 intervertebral disc space is noted. Mild posterior intervertebral disc space narrowing is seen throughout the remainder of the cervical spine. Bilateral mild-to-moderate severity multilevel facet joint hypertrophy is noted. Upper chest: Multifocal areas of biapical scarring,  atelectasis and/or infiltrate are seen. Other: A mild amount of heterogeneous material is seen within the trachea near the level of the thoracic inlet. IMPRESSION: 1. No acute fracture or subluxation in the cervical spine. 2. Mild degenerative changes with fusion of the C6-C7 intervertebral disc space. 3. Multifocal areas of biapical scarring, atelectasis and/or infiltrate. 4. Mild amount of heterogeneous material within the trachea near the level of the thoracic inlet, which may represent aspirated material. Electronically Signed   By: Virgina Norfolk M.D.   On: 05/25/2022 19:45   CT PELVIS WO CONTRAST  Result Date: 05/25/2022 CLINICAL DATA:  Fall EXAM: CT PELVIS WITHOUT CONTRAST TECHNIQUE: Multidetector CT imaging of the pelvis was performed following the standard protocol without intravenous contrast. RADIATION DOSE REDUCTION: This exam was performed according to the departmental dose-optimization program which includes automated exposure control, adjustment of the mA and/or kV according to patient size and/or use of iterative reconstruction technique. COMPARISON:  CT abdomen and pelvis 02/01/2015 FINDINGS: Urinary Tract:  No abnormality visualized. Bowel: There is sigmoid colon diverticulosis. Appendix is within normal limits. Vascular/Lymphatic: There are atherosclerotic calcifications of the aorta and coronary arteries. No enlarged lymph nodes are seen. Reproductive:  Uterus is not seen. Other:  No focal body wall hematoma.  No ascites. Musculoskeletal: No acute fracture.  Bones are diffusely osteopenic. IMPRESSION: 1. No acute fracture or dislocation of the pelvis. 2. Sigmoid colon diverticulosis. Aortic Atherosclerosis (ICD10-I70.0). Electronically Signed   By: Ronney Asters M.D.   On: 05/25/2022 19:36   DG Foot Complete Left  Result Date: 05/25/2022 CLINICAL DATA:  First metatarsal pain after fall EXAM: LEFT FOOT - COMPLETE 3 VIEW COMPARISON:  None Available. FINDINGS: Severe osteopenia. Preserved  joint spaces. On the oblique view there is cortical irregularity along the base of the first metatarsal which is not as well seen on other views but there is some distortion of trabeculae in this location on the standard view. Possible fracture. IMPRESSION: 1. Cortical irregularity on one view at the base of the first metatarsal. Worrisome for a subtle fracture. Correlate for point tenderness. 2. Severe osteopenia. Electronically Signed   By: Jill Side M.D.   On: 05/25/2022 19:35   CT Head Wo Contrast  Result Date: 05/25/2022 CLINICAL DATA:  Status post fall. EXAM: CT HEAD WITHOUT CONTRAST TECHNIQUE: Contiguous axial images were obtained from the base of the skull through the vertex without intravenous contrast. RADIATION DOSE REDUCTION: This exam was performed according to the departmental dose-optimization program which includes automated exposure control, adjustment of the mA and/or kV according to patient size and/or use of iterative reconstruction technique. COMPARISON:  November 16, 2020 FINDINGS: Brain: There is  mild cerebral atrophy with widening of the extra-axial spaces and ventricular dilatation. There are areas of decreased attenuation within the white matter tracts of the supratentorial brain, consistent with microvascular disease changes. A chronic left frontal lobe infarct is seen. Vascular: There is moderate severity bilateral cavernous carotid artery calcification. Skull: Normal. Negative for fracture or focal lesion. Sinuses/Orbits: There is mild anterior right maxillary sinus mucosal thickening. Other: None. IMPRESSION: 1. No acute intracranial abnormality. 2. Chronic left frontal lobe infarct. 3. Mild right maxillary sinus disease. Electronically Signed   By: Virgina Norfolk M.D.   On: 05/25/2022 19:33   CT Lumbar Spine Wo Contrast  Result Date: 05/25/2022 CLINICAL DATA:  Back trauma with no prior imaging. Mechanical fall prior to arrival. Severe low back pain. EXAM: CT LUMBAR SPINE  WITHOUT CONTRAST TECHNIQUE: Multidetector CT imaging of the lumbar spine was performed without intravenous contrast administration. Multiplanar CT image reconstructions were also generated. RADIATION DOSE REDUCTION: This exam was performed according to the departmental dose-optimization program which includes automated exposure control, adjustment of the mA and/or kV according to patient size and/or use of iterative reconstruction technique. COMPARISON:  Lumbar spine radiographs 07/23/2014 FINDINGS: Segmentation: 5 lumbar type vertebral bodies. Alignment: Normal alignment. Vertebrae: There is minimal superior anterior endplate compression with cortical irregularity and linear lucency demonstrated at L1 suggesting acute nondisplaced fracture. No evidence of posterior extension. No other vertebral compression deformities are demonstrated. No destructive bone lesions. Paraspinal and other soft tissues: No abnormal paraspinal mass or soft tissue infiltration. Incidental note of aortoiliac vascular calcifications and sigmoid colonic diverticula without evidence of acute diverticulitis. Disc levels: Degenerative disc disease at L4-5 and L5-S1 levels, similar to prior study. IMPRESSION: Cortical irregularity and depression of the anterior superior endplate of L1 suggesting acute nondisplaced fracture. No posterior involvement. Degenerative changes in the lower lumbar spine. Electronically Signed   By: Lucienne Capers M.D.   On: 05/25/2022 19:33    Microbiology: Results for orders placed or performed in visit on 02/01/22  Urine Culture     Status: Abnormal   Collection Time: 02/01/22  9:08 AM   Specimen: Urine   Urine  Result Value Ref Range Status   Urine Culture, Routine Final report (A)  Final   Organism ID, Bacteria Escherichia coli (A)  Final    Comment: Cefazolin <=4 ug/mL Cefazolin with an MIC <=16 predicts susceptibility to the oral agents cefaclor, cefdinir, cefpodoxime, cefprozil, cefuroxime,  cephalexin, and loracarbef when used for therapy of uncomplicated urinary tract infections due to E. coli, Klebsiella pneumoniae, and Proteus mirabilis. Greater than 100,000 colony forming units per mL    Antimicrobial Susceptibility Comment  Final    Comment:       ** S = Susceptible; I = Intermediate; R = Resistant **                    P = Positive; N = Negative             MICS are expressed in micrograms per mL    Antibiotic                 RSLT#1    RSLT#2    RSLT#3    RSLT#4 Amoxicillin/Clavulanic Acid    S Ampicillin                     I Cefepime  S Ceftriaxone                    S Cefuroxime                     I Ciprofloxacin                  S Ertapenem                      S Gentamicin                     S Imipenem                       S Levofloxacin                   S Meropenem                      S Nitrofurantoin                 S Piperacillin/Tazobactam        S Tetracycline                   S Tobramycin                     S Trimethoprim/Sulfa             S     Labs: CBC: Recent Labs  Lab 05/25/22 1947 05/26/22 0219  WBC 8.0 5.3  NEUTROABS 5.8  --   HGB 12.0 10.7*  HCT 36.8 33.1*  MCV 97.4 96.5  PLT 153 [id number]*   Basic Metabolic Panel: Recent Labs  Lab 05/25/22 1947 05/26/22 0219  NA 135 134*  K 3.6 3.7  CL 101 104  CO2 24 25  GLUCOSE 91 101*  BUN 9 11  CREATININE 1.13* 1.11*  CALCIUM 8.3* 8.0*   Liver Function Tests: Recent Labs  Lab 05/26/22 0219  AST 15  ALT 12  ALKPHOS 81  BILITOT 0.7  PROT 5.9*  ALBUMIN 3.2*   CBG: No results for input(s): "GLUCAP" in the last 168 hours.  Discharge time spent: greater than 30 minutes.  Signed: Max Sane, MD [facility] 05/30/2022

## 2022-05-31 DIAGNOSIS — R251 Tremor, unspecified: Secondary | ICD-10-CM | POA: Diagnosis not present

## 2022-05-31 DIAGNOSIS — S32019G Unspecified fracture of first lumbar vertebra, subsequent encounter for fracture with delayed healing: Secondary | ICD-10-CM | POA: Diagnosis not present

## 2022-05-31 DIAGNOSIS — M6281 Muscle weakness (generalized): Secondary | ICD-10-CM | POA: Diagnosis not present

## 2022-05-31 DIAGNOSIS — S92316S Nondisplaced fracture of first metatarsal bone, unspecified foot, sequela: Secondary | ICD-10-CM | POA: Diagnosis not present

## 2022-06-01 DIAGNOSIS — S32019G Unspecified fracture of first lumbar vertebra, subsequent encounter for fracture with delayed healing: Secondary | ICD-10-CM | POA: Diagnosis not present

## 2022-06-01 DIAGNOSIS — S92316S Nondisplaced fracture of first metatarsal bone, unspecified foot, sequela: Secondary | ICD-10-CM | POA: Diagnosis not present

## 2022-06-01 DIAGNOSIS — J449 Chronic obstructive pulmonary disease, unspecified: Secondary | ICD-10-CM | POA: Diagnosis not present

## 2022-06-01 DIAGNOSIS — G629 Polyneuropathy, unspecified: Secondary | ICD-10-CM | POA: Diagnosis not present

## 2022-06-01 NOTE — Progress Notes (Deleted)
   Argentina Ponder Eryca Bolte,acting as a scribe for Lelon Huh, MD.,have documented all relevant documentation on the behalf of Lelon Huh, MD,as directed by  Lelon Huh, MD while in the presence of Lelon Huh, MD.     Established patient visit   Patient: Alexandria Rodriguez   DOB: 01-19-1951   72 y.o. Female  MRN: [id number] Visit Date: 06/02/2022  Today's healthcare provider: Lelon Huh, MD   No chief complaint on file.  Subjective    HPI  Follow up Hospitalization  Patient was admitted to [facility] on 05/25/2022 and discharged on 05/30/2022. She was treated for L1 vertebral fracture and left first metatarsal fracture secondary to fall. She reports {excellent/good/fair:19992} compliance with treatment. She reports this condition is {resolved/improved/worsened:23923}.  ----------------------------------------------------------------------------------------- -   Medications: Outpatient Medications Prior to Visit  Medication Sig   carisoprodol (SOMA) 350 MG tablet TAKE 1 TABLET BY MOUTH TWICE DAILY AS NEEDED (Patient taking differently: Take 350 mg by mouth 2 (two) times daily.)   Cholecalciferol (VITAMIN D3) 50 MCG (2000 UT) CAPS Take 2 capsules (4,000 Units total) by mouth daily.   clopidogrel (PLAVIX) 75 MG tablet Take 1 tablet (75 mg total) by mouth daily.   DULoxetine (CYMBALTA) 20 MG capsule Take 1 capsule (20 mg total) by mouth daily.   fluticasone (FLONASE) 50 MCG/ACT nasal spray Place 2 sprays into both nostrils daily.   fluticasone-salmeterol (ADVAIR) 250-50 MCG/ACT AEPB Inhale 1 puff into the lungs in the morning and at bedtime.   gabapentin (NEURONTIN) 100 MG capsule TAKE 2 CAPSULES(200 MG) BY MOUTH THREE TIMES DAILY IN ADDITION TO 600 MG GABAPENTIN TABLETS   gabapentin (NEURONTIN) 600 MG tablet TAKE 1 TABLET(600 MG) BY MOUTH TWICE DAILY   ibuprofen (ADVIL) 400 MG tablet Take 1 tablet (400 mg total) by mouth every 6 (six) hours as needed for headache, moderate pain, cramping  or mild pain.   lansoprazole (PREVACID) 30 MG capsule TAKE 1 CAPSULE(30 MG) BY MOUTH DAILY   mupirocin ointment (BACTROBAN) 2 % Apply 1 Application topically 2 (two) times daily.   nortriptyline (PAMELOR) 50 MG capsule TAKE 1 CAPSULE(50 MG) BY MOUTH AT BEDTIME   nystatin (MYCOSTATIN/NYSTOP) powder Apply 1 Application topically 3 (three) times daily.   rosuvastatin (CRESTOR) 20 MG tablet Take 20 mg by mouth daily.   triamcinolone ointment (KENALOG) 0.1 % APPLY EXTERNALLY TO THE AFFECTED AREA TWICE DAILY AS NEEDED   No facility-administered medications prior to visit.    Review of Systems  {Labs  Heme  Chem  Endocrine  Serology  Results Review (optional):23779}   Objective    There were no vitals taken for this visit. {Show previous vital signs (optional):23777}  Physical Exam  ***  No results found for any visits on 06/02/22.  Assessment & Plan     ***  No follow-ups on file.      {provider attestation***:1}   Lelon Huh, MD  [place] [phone] (phone) [phone] (fax)  [place]

## 2022-06-02 ENCOUNTER — Inpatient Hospital Stay: Payer: Medicare Other | Admitting: Family Medicine

## 2022-06-06 DIAGNOSIS — S32019G Unspecified fracture of first lumbar vertebra, subsequent encounter for fracture with delayed healing: Secondary | ICD-10-CM | POA: Diagnosis not present

## 2022-06-06 DIAGNOSIS — S92316S Nondisplaced fracture of first metatarsal bone, unspecified foot, sequela: Secondary | ICD-10-CM | POA: Diagnosis not present

## 2022-06-06 DIAGNOSIS — G629 Polyneuropathy, unspecified: Secondary | ICD-10-CM | POA: Diagnosis not present

## 2022-06-06 DIAGNOSIS — M6281 Muscle weakness (generalized): Secondary | ICD-10-CM | POA: Diagnosis not present

## 2022-06-08 ENCOUNTER — Telehealth: Payer: Self-pay | Admitting: *Deleted

## 2022-06-08 ENCOUNTER — Other Ambulatory Visit: Payer: Self-pay | Admitting: *Deleted

## 2022-06-08 DIAGNOSIS — G7000 Myasthenia gravis without (acute) exacerbation: Secondary | ICD-10-CM

## 2022-06-08 NOTE — Patient Outreach (Addendum)
Per [facility] Mrs. Teem discharged from [facility] skilled nursing facility on 06/07/22. Screening for potential [facility] care coordination services as benefit of health plan and PCP.   Will refer to [facility] care coordination team. History of COPD, osteoporosis, CVA, HLD, GERD, recurrent falls, L1 vertebral fractures.  Will inquire with [facility] social workers to inquire about home health arrangements.  Marthenia Rolling, MSN, RN,BSN [place] Acute Care Coordinator [phone] (Direct dial)

## 2022-06-08 NOTE — Progress Notes (Signed)
  Care Coordination   Note   06/08/2022 Name: [name] MRN: [id number] DOB: Sep 03, 1950  Alexandria Rodriguez is a 72 y.o. year old female who sees Fisher, Kirstie Peri, MD for primary care. I reached out to [name] by phone today to offer care coordination services.  [name] was given information about Care Coordination services today including:   The Care Coordination services include support from the care team which includes your Nurse Coordinator, Clinical Social Worker, or Pharmacist.  The Care Coordination team is here to help remove barriers to the health concerns and goals most important to you. Care Coordination services are voluntary, and the patient may decline or stop services at any time by request to their care team member.   Care Coordination Consent Status: Patient agreed to services and verbal consent obtained.   Follow up plan:  Telephone appointment with care coordination team member scheduled for:  06/12/2022  Encounter Outcome:  Pt. Scheduled from referral   Julian Hy, [place] Direct Dial: [phone]

## 2022-06-09 ENCOUNTER — Telehealth: Payer: Self-pay | Admitting: Family Medicine

## 2022-06-09 NOTE — Telephone Encounter (Signed)
Tiffany from [facility] called to report that the patient has declined services until March 2nd  Best contact: [phone] option 2

## 2022-06-10 DIAGNOSIS — S32019D Unspecified fracture of first lumbar vertebra, subsequent encounter for fracture with routine healing: Secondary | ICD-10-CM | POA: Diagnosis not present

## 2022-06-10 DIAGNOSIS — R21 Rash and other nonspecific skin eruption: Secondary | ICD-10-CM | POA: Diagnosis not present

## 2022-06-10 DIAGNOSIS — N390 Urinary tract infection, site not specified: Secondary | ICD-10-CM | POA: Diagnosis not present

## 2022-06-12 ENCOUNTER — Other Ambulatory Visit: Payer: Self-pay

## 2022-06-12 ENCOUNTER — Encounter: Payer: Self-pay | Admitting: *Deleted

## 2022-06-12 ENCOUNTER — Ambulatory Visit: Payer: Self-pay | Admitting: *Deleted

## 2022-06-12 DIAGNOSIS — S32010S Wedge compression fracture of first lumbar vertebra, sequela: Secondary | ICD-10-CM

## 2022-06-12 NOTE — Patient Instructions (Signed)
Visit Information  Thank you for taking time to visit with me today. Please don't hesitate to contact me if I can be of assistance to you before our next scheduled telephone appointment.  Following are the goals we discussed today:  Use walker and/or cane when up in the home.  Call insurance company to follow up on life alert system. Keep working with PT/OT for strength training.  Our next appointment is by telephone on 3/18  Please call the care guide team at [phone] if you need to cancel or reschedule your appointment.   Please call the Suicide and Crisis Lifeline: 988 call the [place] National Suicide Prevention Lifeline: 239-188-9142 or TTY: 639-678-7364 TTY (903)070-3833) to talk to a trained counselor call 1-800-273-TALK (toll free, 24 hour hotline) call 911 if you are experiencing a Mental Health or [place] or need someone to talk to.  The patient verbalized understanding of instructions, educational materials, and care plan provided today and agreed to receive a mailed copy of patient instructions, educational materials, and care plan.   The patient has been provided with contact information for the care management team and has been advised to call with any health related questions or concerns.   [place] Management Care Management Coordinator [phone]

## 2022-06-12 NOTE — Patient Outreach (Signed)
  Care Coordination   Initial Visit Note   06/12/2022 Name: Alexandria Rodriguez MRN: [id number] DOB: [birth year]  Alexandria Rodriguez is a 72 y.o. year old female who sees Fisher, Kirstie Peri, MD for primary care. I spoke with  Alexandria Rodriguez by phone today.  What matters to the patients health and wellness today?  Hospitalized 2/15-2/20 for fall, discharged to SNF for rehab 2/20-2/28.  Lives with husband, state she was eager to get back into her own environment.     Goals Addressed             This Visit's Progress    Free from recurrent falls       Care Coordination Interventions: Provided written and verbal education re: potential causes of falls and Fall prevention strategies Reviewed medications and discussed potential side effects of medications such as dizziness and frequent urination Advised patient of importance of notifying provider of falls Assessed for falls since last encounter Provided patient information for fall alert systems Advised patient to discuss compression socks with provider Screening for signs and symptoms of depression related to chronic disease state  Assessed social determinant of health barriers Advised to call insurance carrier to inquire about covered life/fall alert systems Discussed always using DME when mobile (feels she would not have fallen if she was using her cane).  Now using back brace and foot boot for additional support of fractures Confirmed home health for PT/OT has started, using [facility] Upcoming appointments: surgical provider on 3/5, podiatry on 3/6, and PCP on 3/13 Discussed pain management - using gabapentin currently (rates back 5/10 and foot 4/10, both bearable)          SDOH assessments and interventions completed:  Yes  SDOH Interventions Today    Flowsheet Row Most Recent Value  SDOH Interventions   Food Insecurity Interventions Intervention Not Indicated  Housing Interventions Intervention Not Indicated  Utilities Interventions  Intervention Not Indicated        Care Coordination Interventions:  Yes, provided   Interventions Today    Flowsheet Row Most Recent Value  Chronic Disease   Chronic disease during today's visit Other  [frequent falls]  General Interventions   General Interventions Discussed/Reviewed General Interventions Reviewed, Doctor Visits  Doctor Visits Discussed/Reviewed Doctor Visits Discussed, PCP, Specialist, Annual Wellness Visits  PCP/Specialist Visits Compliance with follow-up visit  Education Interventions   Education Provided Provided Printed Education  Safety Interventions   Safety Discussed/Reviewed Fall Risk, Home Safety  Home Safety Assistive Devices       Follow up plan: Follow up call scheduled for 3/18    Encounter Outcome:  Pt. Visit Completed   Alexandria David, RN, MSN, [place] [facility] Care Management Coordinator [phone]

## 2022-06-13 ENCOUNTER — Ambulatory Visit
Admission: RE | Admit: 2022-06-13 | Discharge: 2022-06-13 | Disposition: A | Discharge status: Home / Self Care | Payer: Medicare Other | Attending: Neurosurgery | Admitting: Neurosurgery

## 2022-06-13 ENCOUNTER — Ambulatory Visit: Payer: Medicare Other | Admitting: Neurosurgery

## 2022-06-13 ENCOUNTER — Encounter: Payer: Self-pay | Admitting: Neurosurgery

## 2022-06-13 ENCOUNTER — Ambulatory Visit
Admission: RE | Admit: 2022-06-13 | Discharge: 2022-06-13 | Disposition: A | Discharge status: Home / Self Care | Payer: Medicare Other | Source: Ambulatory Visit | Attending: Neurosurgery | Admitting: Neurosurgery

## 2022-06-13 VITALS — BP 112/62 | Ht 62.0 in | Wt 130.0 lb

## 2022-06-13 DIAGNOSIS — S32010D Wedge compression fracture of first lumbar vertebra, subsequent encounter for fracture with routine healing: Secondary | ICD-10-CM

## 2022-06-13 DIAGNOSIS — S32010S Wedge compression fracture of first lumbar vertebra, sequela: Secondary | ICD-10-CM

## 2022-06-13 DIAGNOSIS — W19XXXD Unspecified fall, subsequent encounter: Secondary | ICD-10-CM

## 2022-06-13 DIAGNOSIS — M8588 Other specified disorders of bone density and structure, other site: Secondary | ICD-10-CM | POA: Diagnosis not present

## 2022-06-13 NOTE — Progress Notes (Signed)
Follow-up note: Referring Physician:  Birdie Sons, MD [address] [place],  [place] [address]  Primary Physician:  Birdie Sons, MD  Chief Complaint:  f/u of L1 compression fracture   History of Present Illness: Alexandria Rodriguez is a 72 y.o. female who presents today for hospital follow-up of L1 fracture. Overall she is doing well.  She does continue to have some back pain but states it is significantly improved since her hospital consult.  She continues to deny any radiating leg pain.  05/26/22 hospital consult note Alexandria Rodriguez is a 72 y.o presenting with acute low back pain after a fall on 05/25/22 around 4-5 pm. She states she was going to through something away in her trash can when she lost her balance and fell hurting her back and left foot. Today she reports mid to low back pain without pain that radiates into her legs. She denies any associated numbness or tingling.   Review of Systems:  A 10 point review of systems is negative, and the pertinent positives and negatives detailed in the HPI.  Past Medical History: Past Medical History:  Diagnosis Date   Anxiety    Cataract    Cerebrovascular disease    Chronic pain    back pain   Contact dermatitis    COPD (chronic obstructive pulmonary disease) ([place])    Depression    Diverticulosis 10/31/2010   On Tranverse colon   GERD (gastroesophageal reflux disease)    Humerus fracture 04/10/2016   Hyperlipidemia    Myasthenia gravis ([place]) 02/2013   Diagnosed by doctor Manuella Ghazi   Neuropathy    Osteoporosis    Stroke [facility])    Vitamin D deficiency     Past Surgical History: Past Surgical History:  Procedure Laterality Date   ABDOMINAL HYSTERECTOMY  2000   due to excessive bleeeding   CERVICAL CONE BIOPSY     Cervical discectomy and fusion  [facility]   COLONOSCOPY WITH PROPOFOL N/A 12/06/2015   Procedure: COLONOSCOPY WITH PROPOFOL;  Surgeon: Manya Silvas, MD;  Location: [facility];  Service: Endoscopy;  Laterality: N/A;   ESOPHAGOGASTRODUODENOSCOPY N/A 10/05/2014   Procedure: ESOPHAGOGASTRODUODENOSCOPY (EGD);  Surgeon: Manya Silvas, MD;  Location: [facility] ENDOSCOPY;  Service: Endoscopy;  Laterality: N/A;   Repeat cuff surgery  2004   Developed adhesive capsulitis   ROTATOR CUFF REPAIR Right 2003   Shoulder    Allergies: Allergies as of 06/13/2022 - Review Complete 06/13/2022  Allergen Reaction Noted   Effexor xr [venlafaxine hcl er] Other (See Comments) 02/01/2015   Fosamax [alendronate sodium] Nausea And Vomiting 02/01/2015   Percocet [oxycodone-acetaminophen] Swelling 04/10/2016   Erythromycin Swelling and Rash 02/01/2015   Lidoderm [lidocaine] Swelling and Rash 10/05/2014   Morphine and related Swelling, Rash, and Other (See Comments) 10/05/2014   Sulfa antibiotics Swelling and Rash 07/31/2013   Sulfasalazine Rash and Swelling 12/04/2014   Tramadol Swelling and Rash 12/04/2014    Medications: Outpatient Encounter Medications as of 06/13/2022  Medication Sig   carisoprodol (SOMA) 350 MG tablet TAKE 1 TABLET BY MOUTH TWICE DAILY AS NEEDED (Patient taking differently: Take 350 mg by mouth 2 (two) times daily.)   Cholecalciferol (VITAMIN D3) 50 MCG (2000 UT) CAPS Take 2 capsules (4,000 Units total) by mouth daily.   clopidogrel (PLAVIX) 75 MG tablet Take 1 tablet (75 mg total) by mouth daily.   DULoxetine (CYMBALTA) 20 MG capsule Take 1 capsule (20 mg total) by  mouth daily.   fluticasone (FLONASE) 50 MCG/ACT nasal spray Place 2 sprays into both nostrils daily.   fluticasone-salmeterol (ADVAIR) 250-50 MCG/ACT AEPB Inhale 1 puff into the lungs in the morning and at bedtime.   gabapentin (NEURONTIN) 100 MG capsule TAKE 2 CAPSULES(200 MG) BY MOUTH THREE TIMES DAILY IN ADDITION TO 600 MG GABAPENTIN TABLETS   gabapentin (NEURONTIN) 600 MG tablet TAKE 1 TABLET(600 MG) BY MOUTH TWICE DAILY   ibuprofen (ADVIL) 400 MG tablet Take 1 tablet (400 mg total) by mouth  every 6 (six) hours as needed for headache, moderate pain, cramping or mild pain.   lansoprazole (PREVACID) 30 MG capsule TAKE 1 CAPSULE(30 MG) BY MOUTH DAILY   mupirocin ointment (BACTROBAN) 2 % Apply 1 Application topically 2 (two) times daily.   nortriptyline (PAMELOR) 50 MG capsule TAKE 1 CAPSULE(50 MG) BY MOUTH AT BEDTIME   nystatin (MYCOSTATIN/NYSTOP) powder Apply 1 Application topically 3 (three) times daily.   rosuvastatin (CRESTOR) 20 MG tablet Take 20 mg by mouth daily.   triamcinolone ointment (KENALOG) 0.1 % APPLY EXTERNALLY TO THE AFFECTED AREA TWICE DAILY AS NEEDED   No facility-administered encounter medications on file as of 06/13/2022.    Social History: Social History   Tobacco Use   Smoking status: Every Day    Packs/day: 0.50    Years: 42.00    Total pack years: 21.00    Types: Cigarettes   Smokeless tobacco: Never   Tobacco comments:     Has been smoking for 30+ years, Cigarettes per day;10.  Vaping Use   Vaping Use: Never used  Substance Use Topics   Alcohol use: No    Alcohol/week: 0.0 standard drinks of alcohol   Drug use: No    Family Medical History: Family History  Problem Relation Age of Onset   Colon cancer Mother    Stroke Father    Cancer Sister        Ovarian, stable   Breast cancer Maternal Grandmother     Exam: Today's Vitals   06/13/22 1102  BP: 112/62  Weight: 59 kg  Height: '5\' 2"'$  (1.575 m)  PainSc: 4   PainLoc: Back   Body mass index is 23.78 kg/m.  General: A&O ROM of spine: limited due to pain.  Palpation of spine: TTP.   Strength: 5/5 throughout BLE except left distal lower extremity untested due to patient being in a boot.  Imaging: 06/13/22 lumbar xrays  2 to 3 mm progression of L1 compression fracture without segmental kyphosis or concerning features.  Assessment and Plan: [name] is a pleasant 72 y.o. female presenting today for hospital follow-up with L1 fracture.  She is recovering as expected with some  continued back pain which has improved over the last 2 and half weeks.  We discussed continuing her TLSO brace at this time particularly when she is ambulating.  While her follow-up x-rays today do show some mild progression, her overall lumbar alignment is unchanged and clinically she is improving.  There is no segmental kyphosis.  See her back in 8 to 12 weeks with repeat lumbar x-rays prior.  She was given return precautions.  I spent a total of 27 minutes in both face-to-face and non-face-to-face activities for this visit on the date of this encounter including review of records, review of imaging, discussion of symptoms, physical exam, discussion of prognosis, and documentation.  Cooper Render PA-C Neurosurgery

## 2022-06-14 ENCOUNTER — Ambulatory Visit (INDEPENDENT_AMBULATORY_CARE_PROVIDER_SITE_OTHER): Payer: Medicare Other

## 2022-06-14 ENCOUNTER — Encounter: Payer: Self-pay | Admitting: Podiatry

## 2022-06-14 ENCOUNTER — Ambulatory Visit: Payer: Medicare Other | Admitting: Podiatry

## 2022-06-14 DIAGNOSIS — S92325A Nondisplaced fracture of second metatarsal bone, left foot, initial encounter for closed fracture: Secondary | ICD-10-CM | POA: Diagnosis not present

## 2022-06-14 DIAGNOSIS — S92335A Nondisplaced fracture of third metatarsal bone, left foot, initial encounter for closed fracture: Secondary | ICD-10-CM | POA: Diagnosis not present

## 2022-06-15 ENCOUNTER — Telehealth: Payer: Self-pay | Admitting: Family Medicine

## 2022-06-15 DIAGNOSIS — S32019D Unspecified fracture of first lumbar vertebra, subsequent encounter for fracture with routine healing: Secondary | ICD-10-CM | POA: Diagnosis not present

## 2022-06-15 DIAGNOSIS — R21 Rash and other nonspecific skin eruption: Secondary | ICD-10-CM | POA: Diagnosis not present

## 2022-06-15 DIAGNOSIS — N390 Urinary tract infection, site not specified: Secondary | ICD-10-CM | POA: Diagnosis not present

## 2022-06-15 NOTE — Telephone Encounter (Signed)
[place] for verbal orders.    Eulis Foster, MD  [facility]

## 2022-06-15 NOTE — Telephone Encounter (Signed)
Verbal orders given to [facility]

## 2022-06-15 NOTE — Progress Notes (Signed)
She presents today states that she fell about 3.5 weeks ago she states that she was fracture foot she was put in the boot and is still little tender.  She is wondering when she is will be able to get out of the boot.  Objective: Vital signs stable alert oriented x 3 there is no erythema edema salines drainage or odor.  Radiographs taken today demonstrate a fracture at the neck of the second and third metatarsals and what appears to be a fracture at the base of the first metatarsal all minimally dislocated.  Assessment: Fracture second and third metatarsals and first met base.  Plan: Stay in the boot for another 5 weeks have her back to x-ray at that time.  Did encourage vitamin D3

## 2022-06-15 NOTE — Telephone Encounter (Signed)
Gerald Stabs calling from [facility] calling to request PT. Frequency- 2 W 3, 1 W 5.  Verbal ok on VM CB- [phone]

## 2022-06-19 ENCOUNTER — Telehealth: Payer: Self-pay | Admitting: Family Medicine

## 2022-06-19 DIAGNOSIS — R21 Rash and other nonspecific skin eruption: Secondary | ICD-10-CM | POA: Diagnosis not present

## 2022-06-19 DIAGNOSIS — S32019D Unspecified fracture of first lumbar vertebra, subsequent encounter for fracture with routine healing: Secondary | ICD-10-CM | POA: Diagnosis not present

## 2022-06-19 DIAGNOSIS — N390 Urinary tract infection, site not specified: Secondary | ICD-10-CM | POA: Diagnosis not present

## 2022-06-19 NOTE — Telephone Encounter (Signed)
Lattie Haw calling from [place] calling for approval to move visit from last week to this week for OT eval. Cb- [phone] verbal ok on VM

## 2022-06-19 NOTE — Telephone Encounter (Signed)
That's fine

## 2022-06-19 NOTE — Telephone Encounter (Signed)
Verbal order been given

## 2022-06-20 DIAGNOSIS — S32019D Unspecified fracture of first lumbar vertebra, subsequent encounter for fracture with routine healing: Secondary | ICD-10-CM | POA: Diagnosis not present

## 2022-06-20 DIAGNOSIS — R21 Rash and other nonspecific skin eruption: Secondary | ICD-10-CM | POA: Diagnosis not present

## 2022-06-20 DIAGNOSIS — N390 Urinary tract infection, site not specified: Secondary | ICD-10-CM | POA: Diagnosis not present

## 2022-06-20 LAB — HM DEXA SCAN

## 2022-06-21 ENCOUNTER — Ambulatory Visit: Payer: Self-pay

## 2022-06-21 ENCOUNTER — Ambulatory Visit: Payer: Medicare Other

## 2022-06-21 DIAGNOSIS — E785 Hyperlipidemia, unspecified: Secondary | ICD-10-CM | POA: Diagnosis not present

## 2022-06-21 DIAGNOSIS — Z87891 Personal history of nicotine dependence: Secondary | ICD-10-CM | POA: Diagnosis not present

## 2022-06-21 DIAGNOSIS — Z8673 Personal history of transient ischemic attack (TIA), and cerebral infarction without residual deficits: Secondary | ICD-10-CM | POA: Diagnosis not present

## 2022-06-21 DIAGNOSIS — M80072D Age-related osteoporosis with current pathological fracture, left ankle and foot, subsequent encounter for fracture with routine healing: Secondary | ICD-10-CM | POA: Diagnosis not present

## 2022-06-21 DIAGNOSIS — F32A Depression, unspecified: Secondary | ICD-10-CM | POA: Diagnosis not present

## 2022-06-21 DIAGNOSIS — L259 Unspecified contact dermatitis, unspecified cause: Secondary | ICD-10-CM | POA: Diagnosis not present

## 2022-06-21 DIAGNOSIS — K5790 Diverticulosis of intestine, part unspecified, without perforation or abscess without bleeding: Secondary | ICD-10-CM | POA: Diagnosis not present

## 2022-06-21 DIAGNOSIS — Z7902 Long term (current) use of antithrombotics/antiplatelets: Secondary | ICD-10-CM | POA: Diagnosis not present

## 2022-06-21 DIAGNOSIS — G8929 Other chronic pain: Secondary | ICD-10-CM | POA: Diagnosis not present

## 2022-06-21 DIAGNOSIS — K219 Gastro-esophageal reflux disease without esophagitis: Secondary | ICD-10-CM | POA: Diagnosis not present

## 2022-06-21 DIAGNOSIS — J449 Chronic obstructive pulmonary disease, unspecified: Secondary | ICD-10-CM | POA: Diagnosis not present

## 2022-06-21 DIAGNOSIS — Z7951 Long term (current) use of inhaled steroids: Secondary | ICD-10-CM | POA: Diagnosis not present

## 2022-06-21 DIAGNOSIS — G629 Polyneuropathy, unspecified: Secondary | ICD-10-CM | POA: Diagnosis not present

## 2022-06-21 DIAGNOSIS — Z791 Long term (current) use of non-steroidal anti-inflammatories (NSAID): Secondary | ICD-10-CM | POA: Diagnosis not present

## 2022-06-21 DIAGNOSIS — G7000 Myasthenia gravis without (acute) exacerbation: Secondary | ICD-10-CM | POA: Diagnosis not present

## 2022-06-21 DIAGNOSIS — Z9181 History of falling: Secondary | ICD-10-CM | POA: Diagnosis not present

## 2022-06-21 DIAGNOSIS — M8008XD Age-related osteoporosis with current pathological fracture, vertebra(e), subsequent encounter for fracture with routine healing: Secondary | ICD-10-CM | POA: Diagnosis not present

## 2022-06-21 NOTE — Telephone Encounter (Signed)
     Chief Complaint: [place] at home lastnight. Tripped on her walker. Sore all over, denies injury, other than skin tear left arm. Declines OV. Symptoms: Above Frequency: Last night Pertinent Negatives: Patient denies injury. Did not hit head. Disposition: [] ED /[] Urgent Care (no appt availability in office) / [] Appointment(In office/virtual)/ []  [facility]/ [] Home Care/ [] Refused Recommended Disposition /[] [facility]/ [x]  Follow-up with PCP Additional Notes:   Reason for Disposition  MILD weakness (i.e., does not interfere with ability to work, go to school, normal activities)  (Exception: Mild weakness is a chronic symptom.)  Answer Assessment - Initial Assessment Questions 1. MECHANISM: "How did the fall happen?"     Tripped on walker 2. DOMESTIC VIOLENCE AND ELDER ABUSE SCREENING: "Did you fall because someone pushed you or tried to hurt you?" If Yes, ask: "Are you safe now?"     No 3. ONSET: "When did the fall happen?" (e.g., minutes, hours, or days ago)     Last night 4. LOCATION: "What part of the body hit the ground?" (e.g., back, buttocks, head, hips, knees, hands, head, stomach)     Left side - left arm 5. INJURY: "Did you hurt (injure) yourself when you fell?" If Yes, ask: "What did you injure? Tell me more about this?" (e.g., body area; type of injury; pain severity)"     Sore all over 6. PAIN: "Is there any pain?" If Yes, ask: "How bad is the pain?" (e.g., Scale 1-10; or mild,  moderate, severe)   - NONE (0): No pain   - MILD (1-3): Doesn't interfere with normal activities    - MODERATE (4-7): Interferes with normal activities or awakens from sleep    - SEVERE (8-10): Excruciating pain, unable to do any normal activities      Moderate - "SORE" 7. SIZE: For cuts, bruises, or swelling, ask: "How large is it?" (e.g., inches or centimeters)      Skin tear 8. PREGNANCY: "Is there any chance you are pregnant?" "When was your last menstrual period?"      No 9. OTHER SYMPTOMS: "Do you have any other symptoms?" (e.g., dizziness, fever, weakness; new onset or worsening).      No 10. CAUSE: "What do you think caused the fall (or falling)?" (e.g., tripped, dizzy spell)       Tripped in kitchen and tripped on walker  Protocols used: Falls and [facility]

## 2022-06-21 NOTE — Telephone Encounter (Signed)
That's fine

## 2022-06-21 NOTE — Telephone Encounter (Signed)
Beulah- requesting verbal order from provider. She has assessed the skin tear and believes patient would benefit from Xeroform With adhesive dressing. Would like order for biweekly dressing changes for monitoring of wound.   [phone] Kluver

## 2022-06-22 NOTE — Telephone Encounter (Signed)
Advised 

## 2022-06-23 ENCOUNTER — Telehealth: Payer: Self-pay | Admitting: Family Medicine

## 2022-06-23 DIAGNOSIS — M810 Age-related osteoporosis without current pathological fracture: Secondary | ICD-10-CM

## 2022-06-23 NOTE — Telephone Encounter (Signed)
Patient advised. Verbalized understanding

## 2022-06-23 NOTE — Telephone Encounter (Signed)
Her bone density test show severe osteoporosis. She needs referral to endocrinology since she can't take Fosmax. Have placed order.

## 2022-06-26 ENCOUNTER — Ambulatory Visit: Payer: Self-pay | Admitting: *Deleted

## 2022-06-26 DIAGNOSIS — Z87891 Personal history of nicotine dependence: Secondary | ICD-10-CM | POA: Diagnosis not present

## 2022-06-26 DIAGNOSIS — E785 Hyperlipidemia, unspecified: Secondary | ICD-10-CM | POA: Diagnosis not present

## 2022-06-26 DIAGNOSIS — Z7951 Long term (current) use of inhaled steroids: Secondary | ICD-10-CM | POA: Diagnosis not present

## 2022-06-26 DIAGNOSIS — L259 Unspecified contact dermatitis, unspecified cause: Secondary | ICD-10-CM | POA: Diagnosis not present

## 2022-06-26 DIAGNOSIS — F32A Depression, unspecified: Secondary | ICD-10-CM | POA: Diagnosis not present

## 2022-06-26 DIAGNOSIS — Z8673 Personal history of transient ischemic attack (TIA), and cerebral infarction without residual deficits: Secondary | ICD-10-CM | POA: Diagnosis not present

## 2022-06-26 DIAGNOSIS — G8929 Other chronic pain: Secondary | ICD-10-CM | POA: Diagnosis not present

## 2022-06-26 DIAGNOSIS — G629 Polyneuropathy, unspecified: Secondary | ICD-10-CM | POA: Diagnosis not present

## 2022-06-26 DIAGNOSIS — Z7902 Long term (current) use of antithrombotics/antiplatelets: Secondary | ICD-10-CM | POA: Diagnosis not present

## 2022-06-26 DIAGNOSIS — M8008XD Age-related osteoporosis with current pathological fracture, vertebra(e), subsequent encounter for fracture with routine healing: Secondary | ICD-10-CM | POA: Diagnosis not present

## 2022-06-26 DIAGNOSIS — Z791 Long term (current) use of non-steroidal anti-inflammatories (NSAID): Secondary | ICD-10-CM | POA: Diagnosis not present

## 2022-06-26 DIAGNOSIS — K5790 Diverticulosis of intestine, part unspecified, without perforation or abscess without bleeding: Secondary | ICD-10-CM | POA: Diagnosis not present

## 2022-06-26 DIAGNOSIS — K219 Gastro-esophageal reflux disease without esophagitis: Secondary | ICD-10-CM | POA: Diagnosis not present

## 2022-06-26 DIAGNOSIS — J449 Chronic obstructive pulmonary disease, unspecified: Secondary | ICD-10-CM | POA: Diagnosis not present

## 2022-06-26 DIAGNOSIS — Z9181 History of falling: Secondary | ICD-10-CM | POA: Diagnosis not present

## 2022-06-26 DIAGNOSIS — M80072D Age-related osteoporosis with current pathological fracture, left ankle and foot, subsequent encounter for fracture with routine healing: Secondary | ICD-10-CM | POA: Diagnosis not present

## 2022-06-26 DIAGNOSIS — G7000 Myasthenia gravis without (acute) exacerbation: Secondary | ICD-10-CM | POA: Diagnosis not present

## 2022-06-26 DIAGNOSIS — S51802A Unspecified open wound of left forearm, initial encounter: Secondary | ICD-10-CM | POA: Diagnosis not present

## 2022-06-26 NOTE — Patient Outreach (Signed)
  Care Coordination   Follow Up Visit Note   06/26/2022 Name: JAHZARRA DESHAY MRN: [id number] DOB: 07-24-1950  Alexandria Rodriguez is a 72 y.o. year old female who sees Fisher, Kirstie Peri, MD for primary care. I spoke with  [name] by phone today.  What matters to the patients health and wellness today?  Remaining fall free, working with PT/OT for increased strength.     Goals Addressed             This Visit's Progress    Free from recurrent falls   On track    Care Coordination Interventions: Provided written and verbal education re: potential causes of falls and Fall prevention strategies Reviewed medications and discussed potential side effects of medications such as dizziness and frequent urination Advised patient of importance of notifying provider of falls Assessed for falls since last encounter Provided patient information for fall alert systems Advised patient to discuss compression socks with provider Advised to call insurance carrier to inquire about covered life/fall alert systems - encouraged to call again Discussed always using DME when mobile (feels she would not have fallen if she was using her cane).  Now using back brace and foot boot for additional support of fractures Confirmed home health for PT/OT has started, using [facility], feels this has made her stronger.  Will continue using walker until boot is removed. Upcoming appointments: podiatry on 4/17, cardiology on 4/19, and neurosurgery on 5/28 Discussed pain management - using gabapentin currently (rates back 5/10 and foot 4/10, both bearable)          SDOH assessments and interventions completed:  No     Care Coordination Interventions:  Yes, provided   Interventions Today    Flowsheet Row Most Recent Value  Chronic Disease   Chronic disease during today's visit Other  [falls]  General Interventions   General Interventions Discussed/Reviewed General Interventions Reviewed, Doctor Visits  Doctor  Visits Discussed/Reviewed Doctor Visits Reviewed, Specialist  PCP/Specialist Visits Compliance with follow-up visit  Safety Interventions   Safety Discussed/Reviewed Fall Risk, Home Safety  Home Safety Assistive Devices        Follow up plan: Follow up call scheduled for 4/23    Encounter Outcome:  Pt. Visit Completed   Valente David, RN, MSN, [place] Care Management Care Management Coordinator [phone]

## 2022-06-28 ENCOUNTER — Ambulatory Visit (INDEPENDENT_AMBULATORY_CARE_PROVIDER_SITE_OTHER): Payer: Medicare Other

## 2022-06-28 DIAGNOSIS — G7000 Myasthenia gravis without (acute) exacerbation: Secondary | ICD-10-CM | POA: Diagnosis not present

## 2022-06-28 DIAGNOSIS — G629 Polyneuropathy, unspecified: Secondary | ICD-10-CM | POA: Diagnosis not present

## 2022-06-28 DIAGNOSIS — Z9181 History of falling: Secondary | ICD-10-CM | POA: Diagnosis not present

## 2022-06-28 DIAGNOSIS — G8929 Other chronic pain: Secondary | ICD-10-CM | POA: Diagnosis not present

## 2022-06-28 DIAGNOSIS — E538 Deficiency of other specified B group vitamins: Secondary | ICD-10-CM | POA: Diagnosis not present

## 2022-06-28 DIAGNOSIS — Z87891 Personal history of nicotine dependence: Secondary | ICD-10-CM | POA: Diagnosis not present

## 2022-06-28 DIAGNOSIS — Z8673 Personal history of transient ischemic attack (TIA), and cerebral infarction without residual deficits: Secondary | ICD-10-CM | POA: Diagnosis not present

## 2022-06-28 DIAGNOSIS — L259 Unspecified contact dermatitis, unspecified cause: Secondary | ICD-10-CM | POA: Diagnosis not present

## 2022-06-28 DIAGNOSIS — J449 Chronic obstructive pulmonary disease, unspecified: Secondary | ICD-10-CM | POA: Diagnosis not present

## 2022-06-28 DIAGNOSIS — M8008XD Age-related osteoporosis with current pathological fracture, vertebra(e), subsequent encounter for fracture with routine healing: Secondary | ICD-10-CM | POA: Diagnosis not present

## 2022-06-28 DIAGNOSIS — K219 Gastro-esophageal reflux disease without esophagitis: Secondary | ICD-10-CM | POA: Diagnosis not present

## 2022-06-28 DIAGNOSIS — F32A Depression, unspecified: Secondary | ICD-10-CM | POA: Diagnosis not present

## 2022-06-28 DIAGNOSIS — Z791 Long term (current) use of non-steroidal anti-inflammatories (NSAID): Secondary | ICD-10-CM | POA: Diagnosis not present

## 2022-06-28 DIAGNOSIS — E785 Hyperlipidemia, unspecified: Secondary | ICD-10-CM | POA: Diagnosis not present

## 2022-06-28 DIAGNOSIS — Z7951 Long term (current) use of inhaled steroids: Secondary | ICD-10-CM | POA: Diagnosis not present

## 2022-06-28 DIAGNOSIS — M80072D Age-related osteoporosis with current pathological fracture, left ankle and foot, subsequent encounter for fracture with routine healing: Secondary | ICD-10-CM | POA: Diagnosis not present

## 2022-06-28 DIAGNOSIS — K5790 Diverticulosis of intestine, part unspecified, without perforation or abscess without bleeding: Secondary | ICD-10-CM | POA: Diagnosis not present

## 2022-06-28 DIAGNOSIS — Z7902 Long term (current) use of antithrombotics/antiplatelets: Secondary | ICD-10-CM | POA: Diagnosis not present

## 2022-06-28 MED ORDER — CYANOCOBALAMIN 1000 MCG/ML IJ SOLN
1000.0000 ug | Freq: Once | INTRAMUSCULAR | Status: AC
  Administered 2022-06-28: 1000 ug via INTRAMUSCULAR

## 2022-06-28 NOTE — Progress Notes (Signed)
B12 administered in patient LD with 25G 1" needle. Advised to schedule next injection in 4 weeks

## 2022-07-03 ENCOUNTER — Telehealth: Payer: Self-pay

## 2022-07-03 DIAGNOSIS — K5790 Diverticulosis of intestine, part unspecified, without perforation or abscess without bleeding: Secondary | ICD-10-CM | POA: Diagnosis not present

## 2022-07-03 DIAGNOSIS — L259 Unspecified contact dermatitis, unspecified cause: Secondary | ICD-10-CM | POA: Diagnosis not present

## 2022-07-03 DIAGNOSIS — G629 Polyneuropathy, unspecified: Secondary | ICD-10-CM | POA: Diagnosis not present

## 2022-07-03 DIAGNOSIS — G7000 Myasthenia gravis without (acute) exacerbation: Secondary | ICD-10-CM | POA: Diagnosis not present

## 2022-07-03 DIAGNOSIS — Z87891 Personal history of nicotine dependence: Secondary | ICD-10-CM | POA: Diagnosis not present

## 2022-07-03 DIAGNOSIS — Z791 Long term (current) use of non-steroidal anti-inflammatories (NSAID): Secondary | ICD-10-CM | POA: Diagnosis not present

## 2022-07-03 DIAGNOSIS — M80072D Age-related osteoporosis with current pathological fracture, left ankle and foot, subsequent encounter for fracture with routine healing: Secondary | ICD-10-CM | POA: Diagnosis not present

## 2022-07-03 DIAGNOSIS — Z8673 Personal history of transient ischemic attack (TIA), and cerebral infarction without residual deficits: Secondary | ICD-10-CM | POA: Diagnosis not present

## 2022-07-03 DIAGNOSIS — Z7951 Long term (current) use of inhaled steroids: Secondary | ICD-10-CM | POA: Diagnosis not present

## 2022-07-03 DIAGNOSIS — M8008XD Age-related osteoporosis with current pathological fracture, vertebra(e), subsequent encounter for fracture with routine healing: Secondary | ICD-10-CM | POA: Diagnosis not present

## 2022-07-03 DIAGNOSIS — Z9181 History of falling: Secondary | ICD-10-CM | POA: Diagnosis not present

## 2022-07-03 DIAGNOSIS — G8929 Other chronic pain: Secondary | ICD-10-CM | POA: Diagnosis not present

## 2022-07-03 DIAGNOSIS — E785 Hyperlipidemia, unspecified: Secondary | ICD-10-CM | POA: Diagnosis not present

## 2022-07-03 DIAGNOSIS — Z7902 Long term (current) use of antithrombotics/antiplatelets: Secondary | ICD-10-CM | POA: Diagnosis not present

## 2022-07-03 DIAGNOSIS — J449 Chronic obstructive pulmonary disease, unspecified: Secondary | ICD-10-CM | POA: Diagnosis not present

## 2022-07-03 DIAGNOSIS — F32A Depression, unspecified: Secondary | ICD-10-CM | POA: Diagnosis not present

## 2022-07-03 DIAGNOSIS — K219 Gastro-esophageal reflux disease without esophagitis: Secondary | ICD-10-CM | POA: Diagnosis not present

## 2022-07-03 NOTE — Telephone Encounter (Signed)
Copied from Lane [id number] [phone]. Topic: General - Inquiry >> Jul 03, 2022 10:54 AM Erskine Squibb wrote: Reason for CRM: Alexandria Rodriguez with [facility] called in to let the provider know the patient fell on Saturday bruising her right knee and she has some additional stiffness.

## 2022-07-05 DIAGNOSIS — Z87891 Personal history of nicotine dependence: Secondary | ICD-10-CM | POA: Diagnosis not present

## 2022-07-05 DIAGNOSIS — Z9181 History of falling: Secondary | ICD-10-CM | POA: Diagnosis not present

## 2022-07-05 DIAGNOSIS — K5790 Diverticulosis of intestine, part unspecified, without perforation or abscess without bleeding: Secondary | ICD-10-CM | POA: Diagnosis not present

## 2022-07-05 DIAGNOSIS — L259 Unspecified contact dermatitis, unspecified cause: Secondary | ICD-10-CM | POA: Diagnosis not present

## 2022-07-05 DIAGNOSIS — Z7902 Long term (current) use of antithrombotics/antiplatelets: Secondary | ICD-10-CM | POA: Diagnosis not present

## 2022-07-05 DIAGNOSIS — E785 Hyperlipidemia, unspecified: Secondary | ICD-10-CM | POA: Diagnosis not present

## 2022-07-05 DIAGNOSIS — Z7951 Long term (current) use of inhaled steroids: Secondary | ICD-10-CM | POA: Diagnosis not present

## 2022-07-05 DIAGNOSIS — J449 Chronic obstructive pulmonary disease, unspecified: Secondary | ICD-10-CM | POA: Diagnosis not present

## 2022-07-05 DIAGNOSIS — M80072D Age-related osteoporosis with current pathological fracture, left ankle and foot, subsequent encounter for fracture with routine healing: Secondary | ICD-10-CM | POA: Diagnosis not present

## 2022-07-05 DIAGNOSIS — M8008XD Age-related osteoporosis with current pathological fracture, vertebra(e), subsequent encounter for fracture with routine healing: Secondary | ICD-10-CM | POA: Diagnosis not present

## 2022-07-05 DIAGNOSIS — Z8673 Personal history of transient ischemic attack (TIA), and cerebral infarction without residual deficits: Secondary | ICD-10-CM | POA: Diagnosis not present

## 2022-07-05 DIAGNOSIS — F32A Depression, unspecified: Secondary | ICD-10-CM | POA: Diagnosis not present

## 2022-07-05 DIAGNOSIS — G8929 Other chronic pain: Secondary | ICD-10-CM | POA: Diagnosis not present

## 2022-07-05 DIAGNOSIS — G629 Polyneuropathy, unspecified: Secondary | ICD-10-CM | POA: Diagnosis not present

## 2022-07-05 DIAGNOSIS — Z791 Long term (current) use of non-steroidal anti-inflammatories (NSAID): Secondary | ICD-10-CM | POA: Diagnosis not present

## 2022-07-05 DIAGNOSIS — K219 Gastro-esophageal reflux disease without esophagitis: Secondary | ICD-10-CM | POA: Diagnosis not present

## 2022-07-05 DIAGNOSIS — G7000 Myasthenia gravis without (acute) exacerbation: Secondary | ICD-10-CM | POA: Diagnosis not present

## 2022-07-06 DIAGNOSIS — K219 Gastro-esophageal reflux disease without esophagitis: Secondary | ICD-10-CM | POA: Diagnosis not present

## 2022-07-06 DIAGNOSIS — E785 Hyperlipidemia, unspecified: Secondary | ICD-10-CM | POA: Diagnosis not present

## 2022-07-06 DIAGNOSIS — Z9181 History of falling: Secondary | ICD-10-CM | POA: Diagnosis not present

## 2022-07-06 DIAGNOSIS — G629 Polyneuropathy, unspecified: Secondary | ICD-10-CM | POA: Diagnosis not present

## 2022-07-06 DIAGNOSIS — M80072D Age-related osteoporosis with current pathological fracture, left ankle and foot, subsequent encounter for fracture with routine healing: Secondary | ICD-10-CM | POA: Diagnosis not present

## 2022-07-06 DIAGNOSIS — G7000 Myasthenia gravis without (acute) exacerbation: Secondary | ICD-10-CM | POA: Diagnosis not present

## 2022-07-06 DIAGNOSIS — Z7902 Long term (current) use of antithrombotics/antiplatelets: Secondary | ICD-10-CM | POA: Diagnosis not present

## 2022-07-06 DIAGNOSIS — Z791 Long term (current) use of non-steroidal anti-inflammatories (NSAID): Secondary | ICD-10-CM | POA: Diagnosis not present

## 2022-07-06 DIAGNOSIS — G8929 Other chronic pain: Secondary | ICD-10-CM | POA: Diagnosis not present

## 2022-07-06 DIAGNOSIS — Z8673 Personal history of transient ischemic attack (TIA), and cerebral infarction without residual deficits: Secondary | ICD-10-CM | POA: Diagnosis not present

## 2022-07-06 DIAGNOSIS — F32A Depression, unspecified: Secondary | ICD-10-CM | POA: Diagnosis not present

## 2022-07-06 DIAGNOSIS — K5790 Diverticulosis of intestine, part unspecified, without perforation or abscess without bleeding: Secondary | ICD-10-CM | POA: Diagnosis not present

## 2022-07-06 DIAGNOSIS — M8008XD Age-related osteoporosis with current pathological fracture, vertebra(e), subsequent encounter for fracture with routine healing: Secondary | ICD-10-CM | POA: Diagnosis not present

## 2022-07-06 DIAGNOSIS — Z87891 Personal history of nicotine dependence: Secondary | ICD-10-CM | POA: Diagnosis not present

## 2022-07-06 DIAGNOSIS — Z7951 Long term (current) use of inhaled steroids: Secondary | ICD-10-CM | POA: Diagnosis not present

## 2022-07-06 DIAGNOSIS — J449 Chronic obstructive pulmonary disease, unspecified: Secondary | ICD-10-CM | POA: Diagnosis not present

## 2022-07-06 DIAGNOSIS — L259 Unspecified contact dermatitis, unspecified cause: Secondary | ICD-10-CM | POA: Diagnosis not present

## 2022-07-10 DIAGNOSIS — G629 Polyneuropathy, unspecified: Secondary | ICD-10-CM | POA: Diagnosis not present

## 2022-07-10 DIAGNOSIS — Z7951 Long term (current) use of inhaled steroids: Secondary | ICD-10-CM | POA: Diagnosis not present

## 2022-07-10 DIAGNOSIS — Z9181 History of falling: Secondary | ICD-10-CM | POA: Diagnosis not present

## 2022-07-10 DIAGNOSIS — G7000 Myasthenia gravis without (acute) exacerbation: Secondary | ICD-10-CM | POA: Diagnosis not present

## 2022-07-10 DIAGNOSIS — Z8673 Personal history of transient ischemic attack (TIA), and cerebral infarction without residual deficits: Secondary | ICD-10-CM | POA: Diagnosis not present

## 2022-07-10 DIAGNOSIS — Z791 Long term (current) use of non-steroidal anti-inflammatories (NSAID): Secondary | ICD-10-CM | POA: Diagnosis not present

## 2022-07-10 DIAGNOSIS — F32A Depression, unspecified: Secondary | ICD-10-CM | POA: Diagnosis not present

## 2022-07-10 DIAGNOSIS — Z87891 Personal history of nicotine dependence: Secondary | ICD-10-CM | POA: Diagnosis not present

## 2022-07-10 DIAGNOSIS — M8008XD Age-related osteoporosis with current pathological fracture, vertebra(e), subsequent encounter for fracture with routine healing: Secondary | ICD-10-CM | POA: Diagnosis not present

## 2022-07-10 DIAGNOSIS — L259 Unspecified contact dermatitis, unspecified cause: Secondary | ICD-10-CM | POA: Diagnosis not present

## 2022-07-10 DIAGNOSIS — Z7902 Long term (current) use of antithrombotics/antiplatelets: Secondary | ICD-10-CM | POA: Diagnosis not present

## 2022-07-10 DIAGNOSIS — E785 Hyperlipidemia, unspecified: Secondary | ICD-10-CM | POA: Diagnosis not present

## 2022-07-10 DIAGNOSIS — M80072D Age-related osteoporosis with current pathological fracture, left ankle and foot, subsequent encounter for fracture with routine healing: Secondary | ICD-10-CM | POA: Diagnosis not present

## 2022-07-10 DIAGNOSIS — J449 Chronic obstructive pulmonary disease, unspecified: Secondary | ICD-10-CM | POA: Diagnosis not present

## 2022-07-10 DIAGNOSIS — K5790 Diverticulosis of intestine, part unspecified, without perforation or abscess without bleeding: Secondary | ICD-10-CM | POA: Diagnosis not present

## 2022-07-10 DIAGNOSIS — K219 Gastro-esophageal reflux disease without esophagitis: Secondary | ICD-10-CM | POA: Diagnosis not present

## 2022-07-10 DIAGNOSIS — G8929 Other chronic pain: Secondary | ICD-10-CM | POA: Diagnosis not present

## 2022-07-12 ENCOUNTER — Other Ambulatory Visit: Payer: Self-pay | Admitting: Family Medicine

## 2022-07-12 DIAGNOSIS — G8929 Other chronic pain: Secondary | ICD-10-CM | POA: Diagnosis not present

## 2022-07-12 DIAGNOSIS — M8008XD Age-related osteoporosis with current pathological fracture, vertebra(e), subsequent encounter for fracture with routine healing: Secondary | ICD-10-CM | POA: Diagnosis not present

## 2022-07-12 DIAGNOSIS — L259 Unspecified contact dermatitis, unspecified cause: Secondary | ICD-10-CM | POA: Diagnosis not present

## 2022-07-12 DIAGNOSIS — Z9181 History of falling: Secondary | ICD-10-CM | POA: Diagnosis not present

## 2022-07-12 DIAGNOSIS — Z7902 Long term (current) use of antithrombotics/antiplatelets: Secondary | ICD-10-CM | POA: Diagnosis not present

## 2022-07-12 DIAGNOSIS — Z87891 Personal history of nicotine dependence: Secondary | ICD-10-CM | POA: Diagnosis not present

## 2022-07-12 DIAGNOSIS — G629 Polyneuropathy, unspecified: Secondary | ICD-10-CM | POA: Diagnosis not present

## 2022-07-12 DIAGNOSIS — Z8673 Personal history of transient ischemic attack (TIA), and cerebral infarction without residual deficits: Secondary | ICD-10-CM | POA: Diagnosis not present

## 2022-07-12 DIAGNOSIS — F32A Depression, unspecified: Secondary | ICD-10-CM | POA: Diagnosis not present

## 2022-07-12 DIAGNOSIS — Z791 Long term (current) use of non-steroidal anti-inflammatories (NSAID): Secondary | ICD-10-CM | POA: Diagnosis not present

## 2022-07-12 DIAGNOSIS — E785 Hyperlipidemia, unspecified: Secondary | ICD-10-CM | POA: Diagnosis not present

## 2022-07-12 DIAGNOSIS — G7000 Myasthenia gravis without (acute) exacerbation: Secondary | ICD-10-CM | POA: Diagnosis not present

## 2022-07-12 DIAGNOSIS — Z7951 Long term (current) use of inhaled steroids: Secondary | ICD-10-CM | POA: Diagnosis not present

## 2022-07-12 DIAGNOSIS — K219 Gastro-esophageal reflux disease without esophagitis: Secondary | ICD-10-CM | POA: Diagnosis not present

## 2022-07-12 DIAGNOSIS — M80072D Age-related osteoporosis with current pathological fracture, left ankle and foot, subsequent encounter for fracture with routine healing: Secondary | ICD-10-CM | POA: Diagnosis not present

## 2022-07-12 DIAGNOSIS — J449 Chronic obstructive pulmonary disease, unspecified: Secondary | ICD-10-CM | POA: Diagnosis not present

## 2022-07-12 DIAGNOSIS — K5790 Diverticulosis of intestine, part unspecified, without perforation or abscess without bleeding: Secondary | ICD-10-CM | POA: Diagnosis not present

## 2022-07-13 NOTE — Telephone Encounter (Signed)
Requested medication (s) are due for refill today: Yes  Requested medication (s) are on the active medication list: Yes  Last refill:  06/18/21  Future visit scheduled: Nurse visit  Notes to clinic:  Prescription has expired.    Requested Prescriptions  Pending Prescriptions Disp Refills   clopidogrel (PLAVIX) 75 MG tablet [Pharmacy Med Name: CLOPIDOGREL 75MG  TABLETS] 90 tablet 4    Sig: TAKE 1 TABLET(75 MG) BY MOUTH DAILY     Hematology: Antiplatelets - clopidogrel Failed - 07/12/2022  9:40 PM      Failed - HCT in normal range and within 180 days    HCT  Date Value Ref Range Status  05/26/2022 33.1 (L) 36.0 - 46.0 % Final   Hematocrit  Date Value Ref Range Status  05/04/2022 35.0 34.0 - 46.6 % Final         Failed - HGB in normal range and within 180 days    Hemoglobin  Date Value Ref Range Status  05/26/2022 10.7 (L) 12.0 - 15.0 g/dL Final  05/04/2022 11.2 11.1 - 15.9 g/dL Final         Failed - PLT in normal range and within 180 days    Platelets  Date Value Ref Range Status  05/26/2022 136 (L) 150 - 400 K/uL Final  05/04/2022 231 150 - 450 x10E3/uL Final         Failed - Cr in normal range and within 360 days    Creatinine  Date Value Ref Range Status  05/13/2013 0.79 0.60 - 1.30 mg/dL Final   Creatinine, Ser  Date Value Ref Range Status  05/26/2022 1.11 (H) 0.44 - 1.00 mg/dL Final         Passed - Valid encounter within last 6 months    Recent Outpatient Visits           1 month ago [facility] [name], PA-C   2 months ago Bilateral carotid artery stenosis   [place], Donald E, MD   4 months ago B12 deficiency   [facility] Birdie Sons, MD   5 months ago Urinary tract infection with hematuria, site unspecified   [place], Donald E, MD   6 months ago B12 deficiency   [facility] Birdie Sons, MD       Future Appointments             In 2 weeks Agbor-Etang, Aaron Edelman, MD [facility]

## 2022-07-18 DIAGNOSIS — Z791 Long term (current) use of non-steroidal anti-inflammatories (NSAID): Secondary | ICD-10-CM | POA: Diagnosis not present

## 2022-07-18 DIAGNOSIS — Z7902 Long term (current) use of antithrombotics/antiplatelets: Secondary | ICD-10-CM | POA: Diagnosis not present

## 2022-07-18 DIAGNOSIS — K5790 Diverticulosis of intestine, part unspecified, without perforation or abscess without bleeding: Secondary | ICD-10-CM | POA: Diagnosis not present

## 2022-07-18 DIAGNOSIS — G629 Polyneuropathy, unspecified: Secondary | ICD-10-CM | POA: Diagnosis not present

## 2022-07-18 DIAGNOSIS — E785 Hyperlipidemia, unspecified: Secondary | ICD-10-CM | POA: Diagnosis not present

## 2022-07-18 DIAGNOSIS — M80072D Age-related osteoporosis with current pathological fracture, left ankle and foot, subsequent encounter for fracture with routine healing: Secondary | ICD-10-CM | POA: Diagnosis not present

## 2022-07-18 DIAGNOSIS — F32A Depression, unspecified: Secondary | ICD-10-CM | POA: Diagnosis not present

## 2022-07-18 DIAGNOSIS — Z8673 Personal history of transient ischemic attack (TIA), and cerebral infarction without residual deficits: Secondary | ICD-10-CM | POA: Diagnosis not present

## 2022-07-18 DIAGNOSIS — G7000 Myasthenia gravis without (acute) exacerbation: Secondary | ICD-10-CM | POA: Diagnosis not present

## 2022-07-18 DIAGNOSIS — Z9181 History of falling: Secondary | ICD-10-CM | POA: Diagnosis not present

## 2022-07-18 DIAGNOSIS — G8929 Other chronic pain: Secondary | ICD-10-CM | POA: Diagnosis not present

## 2022-07-18 DIAGNOSIS — L259 Unspecified contact dermatitis, unspecified cause: Secondary | ICD-10-CM | POA: Diagnosis not present

## 2022-07-18 DIAGNOSIS — M8008XD Age-related osteoporosis with current pathological fracture, vertebra(e), subsequent encounter for fracture with routine healing: Secondary | ICD-10-CM | POA: Diagnosis not present

## 2022-07-18 DIAGNOSIS — J449 Chronic obstructive pulmonary disease, unspecified: Secondary | ICD-10-CM | POA: Diagnosis not present

## 2022-07-18 DIAGNOSIS — Z87891 Personal history of nicotine dependence: Secondary | ICD-10-CM | POA: Diagnosis not present

## 2022-07-18 DIAGNOSIS — K219 Gastro-esophageal reflux disease without esophagitis: Secondary | ICD-10-CM | POA: Diagnosis not present

## 2022-07-18 DIAGNOSIS — Z7951 Long term (current) use of inhaled steroids: Secondary | ICD-10-CM | POA: Diagnosis not present

## 2022-07-20 DIAGNOSIS — G8929 Other chronic pain: Secondary | ICD-10-CM | POA: Diagnosis not present

## 2022-07-20 DIAGNOSIS — M8008XD Age-related osteoporosis with current pathological fracture, vertebra(e), subsequent encounter for fracture with routine healing: Secondary | ICD-10-CM | POA: Diagnosis not present

## 2022-07-20 DIAGNOSIS — L259 Unspecified contact dermatitis, unspecified cause: Secondary | ICD-10-CM | POA: Diagnosis not present

## 2022-07-20 DIAGNOSIS — F32A Depression, unspecified: Secondary | ICD-10-CM | POA: Diagnosis not present

## 2022-07-20 DIAGNOSIS — Z791 Long term (current) use of non-steroidal anti-inflammatories (NSAID): Secondary | ICD-10-CM | POA: Diagnosis not present

## 2022-07-20 DIAGNOSIS — J449 Chronic obstructive pulmonary disease, unspecified: Secondary | ICD-10-CM | POA: Diagnosis not present

## 2022-07-20 DIAGNOSIS — Z8673 Personal history of transient ischemic attack (TIA), and cerebral infarction without residual deficits: Secondary | ICD-10-CM | POA: Diagnosis not present

## 2022-07-20 DIAGNOSIS — K5790 Diverticulosis of intestine, part unspecified, without perforation or abscess without bleeding: Secondary | ICD-10-CM | POA: Diagnosis not present

## 2022-07-20 DIAGNOSIS — Z9181 History of falling: Secondary | ICD-10-CM | POA: Diagnosis not present

## 2022-07-20 DIAGNOSIS — K219 Gastro-esophageal reflux disease without esophagitis: Secondary | ICD-10-CM | POA: Diagnosis not present

## 2022-07-20 DIAGNOSIS — E785 Hyperlipidemia, unspecified: Secondary | ICD-10-CM | POA: Diagnosis not present

## 2022-07-20 DIAGNOSIS — G7000 Myasthenia gravis without (acute) exacerbation: Secondary | ICD-10-CM | POA: Diagnosis not present

## 2022-07-20 DIAGNOSIS — Z7951 Long term (current) use of inhaled steroids: Secondary | ICD-10-CM | POA: Diagnosis not present

## 2022-07-20 DIAGNOSIS — G629 Polyneuropathy, unspecified: Secondary | ICD-10-CM | POA: Diagnosis not present

## 2022-07-20 DIAGNOSIS — Z7902 Long term (current) use of antithrombotics/antiplatelets: Secondary | ICD-10-CM | POA: Diagnosis not present

## 2022-07-20 DIAGNOSIS — M80072D Age-related osteoporosis with current pathological fracture, left ankle and foot, subsequent encounter for fracture with routine healing: Secondary | ICD-10-CM | POA: Diagnosis not present

## 2022-07-20 DIAGNOSIS — Z87891 Personal history of nicotine dependence: Secondary | ICD-10-CM | POA: Diagnosis not present

## 2022-07-24 DIAGNOSIS — G8929 Other chronic pain: Secondary | ICD-10-CM | POA: Diagnosis not present

## 2022-07-24 DIAGNOSIS — M8008XD Age-related osteoporosis with current pathological fracture, vertebra(e), subsequent encounter for fracture with routine healing: Secondary | ICD-10-CM | POA: Diagnosis not present

## 2022-07-24 DIAGNOSIS — F32A Depression, unspecified: Secondary | ICD-10-CM | POA: Diagnosis not present

## 2022-07-24 DIAGNOSIS — K5790 Diverticulosis of intestine, part unspecified, without perforation or abscess without bleeding: Secondary | ICD-10-CM | POA: Diagnosis not present

## 2022-07-24 DIAGNOSIS — L259 Unspecified contact dermatitis, unspecified cause: Secondary | ICD-10-CM | POA: Diagnosis not present

## 2022-07-24 DIAGNOSIS — Z87891 Personal history of nicotine dependence: Secondary | ICD-10-CM | POA: Diagnosis not present

## 2022-07-24 DIAGNOSIS — E785 Hyperlipidemia, unspecified: Secondary | ICD-10-CM | POA: Diagnosis not present

## 2022-07-24 DIAGNOSIS — J449 Chronic obstructive pulmonary disease, unspecified: Secondary | ICD-10-CM | POA: Diagnosis not present

## 2022-07-24 DIAGNOSIS — Z8673 Personal history of transient ischemic attack (TIA), and cerebral infarction without residual deficits: Secondary | ICD-10-CM | POA: Diagnosis not present

## 2022-07-24 DIAGNOSIS — Z791 Long term (current) use of non-steroidal anti-inflammatories (NSAID): Secondary | ICD-10-CM | POA: Diagnosis not present

## 2022-07-24 DIAGNOSIS — M80072D Age-related osteoporosis with current pathological fracture, left ankle and foot, subsequent encounter for fracture with routine healing: Secondary | ICD-10-CM | POA: Diagnosis not present

## 2022-07-24 DIAGNOSIS — K219 Gastro-esophageal reflux disease without esophagitis: Secondary | ICD-10-CM | POA: Diagnosis not present

## 2022-07-24 DIAGNOSIS — Z9181 History of falling: Secondary | ICD-10-CM | POA: Diagnosis not present

## 2022-07-24 DIAGNOSIS — Z7902 Long term (current) use of antithrombotics/antiplatelets: Secondary | ICD-10-CM | POA: Diagnosis not present

## 2022-07-24 DIAGNOSIS — Z7951 Long term (current) use of inhaled steroids: Secondary | ICD-10-CM | POA: Diagnosis not present

## 2022-07-24 DIAGNOSIS — G7000 Myasthenia gravis without (acute) exacerbation: Secondary | ICD-10-CM | POA: Diagnosis not present

## 2022-07-24 DIAGNOSIS — G629 Polyneuropathy, unspecified: Secondary | ICD-10-CM | POA: Diagnosis not present

## 2022-07-26 ENCOUNTER — Ambulatory Visit (INDEPENDENT_AMBULATORY_CARE_PROVIDER_SITE_OTHER): Payer: Medicare Other

## 2022-07-26 ENCOUNTER — Ambulatory Visit (INDEPENDENT_AMBULATORY_CARE_PROVIDER_SITE_OTHER): Payer: Medicare Other | Admitting: Podiatry

## 2022-07-26 ENCOUNTER — Encounter: Payer: Self-pay | Admitting: Podiatry

## 2022-07-26 DIAGNOSIS — E538 Deficiency of other specified B group vitamins: Secondary | ICD-10-CM

## 2022-07-26 DIAGNOSIS — S92325A Nondisplaced fracture of second metatarsal bone, left foot, initial encounter for closed fracture: Secondary | ICD-10-CM | POA: Diagnosis not present

## 2022-07-26 DIAGNOSIS — S92325D Nondisplaced fracture of second metatarsal bone, left foot, subsequent encounter for fracture with routine healing: Secondary | ICD-10-CM

## 2022-07-26 MED ORDER — CYANOCOBALAMIN 1000 MCG/ML IJ SOLN
1000.0000 ug | Freq: Once | INTRAMUSCULAR | Status: AC
  Administered 2022-07-26: 1000 ug via INTRAMUSCULAR

## 2022-07-26 NOTE — Progress Notes (Signed)
Alexandria Rodriguez presents today for follow-up of her multiple fractures base of her first and second metatarsals of her left foot.  She states that it seems to be feeling better.  Objective: Vital signs stable oriented x 3.  There is no erythematous mild edema no cellulitis drainage or odor still has tenderness on palpation to the fracture sites.  Assessment: Slowly healing fractures.  Plan: Placed her in a [place]co shoe today follow-up with her in 1 month for another set of x-rays left foot

## 2022-07-27 ENCOUNTER — Telehealth: Payer: Self-pay

## 2022-07-27 DIAGNOSIS — G8929 Other chronic pain: Secondary | ICD-10-CM | POA: Diagnosis not present

## 2022-07-27 DIAGNOSIS — E785 Hyperlipidemia, unspecified: Secondary | ICD-10-CM | POA: Diagnosis not present

## 2022-07-27 DIAGNOSIS — Z8673 Personal history of transient ischemic attack (TIA), and cerebral infarction without residual deficits: Secondary | ICD-10-CM | POA: Diagnosis not present

## 2022-07-27 DIAGNOSIS — M8008XD Age-related osteoporosis with current pathological fracture, vertebra(e), subsequent encounter for fracture with routine healing: Secondary | ICD-10-CM | POA: Diagnosis not present

## 2022-07-27 DIAGNOSIS — K5790 Diverticulosis of intestine, part unspecified, without perforation or abscess without bleeding: Secondary | ICD-10-CM | POA: Diagnosis not present

## 2022-07-27 DIAGNOSIS — Z7902 Long term (current) use of antithrombotics/antiplatelets: Secondary | ICD-10-CM | POA: Diagnosis not present

## 2022-07-27 DIAGNOSIS — Z7951 Long term (current) use of inhaled steroids: Secondary | ICD-10-CM | POA: Diagnosis not present

## 2022-07-27 DIAGNOSIS — M80072D Age-related osteoporosis with current pathological fracture, left ankle and foot, subsequent encounter for fracture with routine healing: Secondary | ICD-10-CM | POA: Diagnosis not present

## 2022-07-27 DIAGNOSIS — L259 Unspecified contact dermatitis, unspecified cause: Secondary | ICD-10-CM | POA: Diagnosis not present

## 2022-07-27 DIAGNOSIS — Z791 Long term (current) use of non-steroidal anti-inflammatories (NSAID): Secondary | ICD-10-CM | POA: Diagnosis not present

## 2022-07-27 DIAGNOSIS — J449 Chronic obstructive pulmonary disease, unspecified: Secondary | ICD-10-CM | POA: Diagnosis not present

## 2022-07-27 DIAGNOSIS — Z9181 History of falling: Secondary | ICD-10-CM | POA: Diagnosis not present

## 2022-07-27 DIAGNOSIS — G629 Polyneuropathy, unspecified: Secondary | ICD-10-CM | POA: Diagnosis not present

## 2022-07-27 DIAGNOSIS — G7000 Myasthenia gravis without (acute) exacerbation: Secondary | ICD-10-CM | POA: Diagnosis not present

## 2022-07-27 DIAGNOSIS — Z87891 Personal history of nicotine dependence: Secondary | ICD-10-CM | POA: Diagnosis not present

## 2022-07-27 DIAGNOSIS — F32A Depression, unspecified: Secondary | ICD-10-CM | POA: Diagnosis not present

## 2022-07-27 DIAGNOSIS — K219 Gastro-esophageal reflux disease without esophagitis: Secondary | ICD-10-CM | POA: Diagnosis not present

## 2022-07-27 NOTE — Telephone Encounter (Signed)
Ok for verbal orders.    Jensyn Cambria Simmons-Robinson, MD  [facility]  

## 2022-07-27 NOTE — Telephone Encounter (Signed)
Alexandria Rodriguez with home health advised of ok orders

## 2022-07-27 NOTE — Telephone Encounter (Signed)
Copied from CRM [id number]. Topic: General - Other >> Jul 27, 2022  1:28 PM Clide Dales wrote: Home Health Verbal Orders - Caller/Agency: [facility] Callback Number: [phone] Opt 2 Requesting OT/PT/Skilled Nursing/Social Work/Speech Therapy: Nursing Frequency: 1w9

## 2022-07-28 ENCOUNTER — Ambulatory Visit: Payer: Medicare Other | Attending: Cardiology | Admitting: Cardiology

## 2022-07-31 ENCOUNTER — Encounter: Payer: Self-pay | Admitting: Cardiology

## 2022-08-01 ENCOUNTER — Other Ambulatory Visit (INDEPENDENT_AMBULATORY_CARE_PROVIDER_SITE_OTHER): Payer: Self-pay | Admitting: Nurse Practitioner

## 2022-08-01 ENCOUNTER — Ambulatory Visit: Payer: Self-pay | Admitting: *Deleted

## 2022-08-01 DIAGNOSIS — E7800 Pure hypercholesterolemia, unspecified: Secondary | ICD-10-CM | POA: Diagnosis not present

## 2022-08-01 DIAGNOSIS — H9313 Tinnitus, bilateral: Secondary | ICD-10-CM | POA: Diagnosis not present

## 2022-08-01 DIAGNOSIS — E559 Vitamin D deficiency, unspecified: Secondary | ICD-10-CM | POA: Diagnosis not present

## 2022-08-01 DIAGNOSIS — M810 Age-related osteoporosis without current pathological fracture: Secondary | ICD-10-CM | POA: Diagnosis not present

## 2022-08-01 DIAGNOSIS — I6523 Occlusion and stenosis of bilateral carotid arteries: Secondary | ICD-10-CM

## 2022-08-01 DIAGNOSIS — K219 Gastro-esophageal reflux disease without esophagitis: Secondary | ICD-10-CM | POA: Diagnosis not present

## 2022-08-01 DIAGNOSIS — I739 Peripheral vascular disease, unspecified: Secondary | ICD-10-CM

## 2022-08-01 NOTE — Patient Outreach (Signed)
  Care Coordination   08/01/2022 Name: Alexandria Rodriguez MRN: [id number] DOB: 04/29/1950   Care Coordination Outreach Attempts:  An unsuccessful telephone outreach was attempted for a scheduled appointment today.  Follow Up Plan:  Additional outreach attempts will be made to offer the patient care coordination information and services.   Encounter Outcome:  No Answer   Care Coordination Interventions:  No, not indicated    Kemper Durie, RN, MSN, [facility] Care Management Care Management Coordinator [phone]

## 2022-08-02 ENCOUNTER — Ambulatory Visit (INDEPENDENT_AMBULATORY_CARE_PROVIDER_SITE_OTHER): Payer: Medicare Other | Admitting: Nurse Practitioner

## 2022-08-02 ENCOUNTER — Encounter (INDEPENDENT_AMBULATORY_CARE_PROVIDER_SITE_OTHER): Payer: Self-pay | Admitting: Nurse Practitioner

## 2022-08-02 ENCOUNTER — Ambulatory Visit (INDEPENDENT_AMBULATORY_CARE_PROVIDER_SITE_OTHER): Payer: Medicare Other

## 2022-08-02 VITALS — BP 140/78 | HR 84 | Resp 16 | Wt 130.6 lb

## 2022-08-02 DIAGNOSIS — Z720 Tobacco use: Secondary | ICD-10-CM | POA: Diagnosis not present

## 2022-08-02 DIAGNOSIS — I6523 Occlusion and stenosis of bilateral carotid arteries: Secondary | ICD-10-CM | POA: Diagnosis not present

## 2022-08-02 DIAGNOSIS — K219 Gastro-esophageal reflux disease without esophagitis: Secondary | ICD-10-CM | POA: Diagnosis not present

## 2022-08-02 DIAGNOSIS — I739 Peripheral vascular disease, unspecified: Secondary | ICD-10-CM | POA: Diagnosis not present

## 2022-08-03 LAB — VAS US ABI WITH/WO TBI
Left ABI: 1.11
Right ABI: 1.15

## 2022-08-04 DIAGNOSIS — Z7902 Long term (current) use of antithrombotics/antiplatelets: Secondary | ICD-10-CM | POA: Diagnosis not present

## 2022-08-04 DIAGNOSIS — G8929 Other chronic pain: Secondary | ICD-10-CM | POA: Diagnosis not present

## 2022-08-04 DIAGNOSIS — K219 Gastro-esophageal reflux disease without esophagitis: Secondary | ICD-10-CM | POA: Diagnosis not present

## 2022-08-04 DIAGNOSIS — Z791 Long term (current) use of non-steroidal anti-inflammatories (NSAID): Secondary | ICD-10-CM | POA: Diagnosis not present

## 2022-08-04 DIAGNOSIS — M80072D Age-related osteoporosis with current pathological fracture, left ankle and foot, subsequent encounter for fracture with routine healing: Secondary | ICD-10-CM | POA: Diagnosis not present

## 2022-08-04 DIAGNOSIS — M8008XD Age-related osteoporosis with current pathological fracture, vertebra(e), subsequent encounter for fracture with routine healing: Secondary | ICD-10-CM | POA: Diagnosis not present

## 2022-08-04 DIAGNOSIS — F32A Depression, unspecified: Secondary | ICD-10-CM | POA: Diagnosis not present

## 2022-08-04 DIAGNOSIS — G7000 Myasthenia gravis without (acute) exacerbation: Secondary | ICD-10-CM | POA: Diagnosis not present

## 2022-08-04 DIAGNOSIS — Z9181 History of falling: Secondary | ICD-10-CM | POA: Diagnosis not present

## 2022-08-04 DIAGNOSIS — L259 Unspecified contact dermatitis, unspecified cause: Secondary | ICD-10-CM | POA: Diagnosis not present

## 2022-08-04 DIAGNOSIS — K5790 Diverticulosis of intestine, part unspecified, without perforation or abscess without bleeding: Secondary | ICD-10-CM | POA: Diagnosis not present

## 2022-08-04 DIAGNOSIS — Z87891 Personal history of nicotine dependence: Secondary | ICD-10-CM | POA: Diagnosis not present

## 2022-08-04 DIAGNOSIS — J449 Chronic obstructive pulmonary disease, unspecified: Secondary | ICD-10-CM | POA: Diagnosis not present

## 2022-08-04 DIAGNOSIS — Z8673 Personal history of transient ischemic attack (TIA), and cerebral infarction without residual deficits: Secondary | ICD-10-CM | POA: Diagnosis not present

## 2022-08-04 DIAGNOSIS — Z7951 Long term (current) use of inhaled steroids: Secondary | ICD-10-CM | POA: Diagnosis not present

## 2022-08-04 DIAGNOSIS — E785 Hyperlipidemia, unspecified: Secondary | ICD-10-CM | POA: Diagnosis not present

## 2022-08-04 DIAGNOSIS — G629 Polyneuropathy, unspecified: Secondary | ICD-10-CM | POA: Diagnosis not present

## 2022-08-06 ENCOUNTER — Other Ambulatory Visit: Payer: Self-pay | Admitting: Family Medicine

## 2022-08-06 DIAGNOSIS — G629 Polyneuropathy, unspecified: Secondary | ICD-10-CM

## 2022-08-08 ENCOUNTER — Encounter: Payer: Self-pay | Admitting: *Deleted

## 2022-08-09 ENCOUNTER — Telehealth: Payer: Self-pay | Admitting: *Deleted

## 2022-08-09 NOTE — Progress Notes (Signed)
  Care Coordination Note  08/09/2022 Name: [name] MRN: [id number] DOB: 03/26/1951  Alexandria Rodriguez is a 72 y.o. year old female who is a primary care patient of Sherrie Mustache, Demetrios Isaacs, MD and is actively engaged with the care management team. I reached out to Lynetta Mare by phone today to assist with re-scheduling a follow up visit with the RN Case Manager  Follow up plan: Unsuccessful telephone outreach attempt made. A HIPAA compliant phone message was left for the patient providing contact information and requesting a return call.   Burman Nieves, CCMA Care Coordination Care Guide Direct Dial: [phone]

## 2022-08-15 ENCOUNTER — Ambulatory Visit
Admission: RE | Admit: 2022-08-15 | Discharge: 2022-08-15 | Disposition: A | Discharge status: Home / Self Care | Payer: Medicare Other | Source: Ambulatory Visit | Attending: Family Medicine | Admitting: Family Medicine

## 2022-08-15 DIAGNOSIS — Z1231 Encounter for screening mammogram for malignant neoplasm of breast: Secondary | ICD-10-CM | POA: Insufficient documentation

## 2022-08-17 DIAGNOSIS — G629 Polyneuropathy, unspecified: Secondary | ICD-10-CM | POA: Diagnosis not present

## 2022-08-17 DIAGNOSIS — F17210 Nicotine dependence, cigarettes, uncomplicated: Secondary | ICD-10-CM | POA: Diagnosis not present

## 2022-08-17 DIAGNOSIS — F17200 Nicotine dependence, unspecified, uncomplicated: Secondary | ICD-10-CM | POA: Diagnosis not present

## 2022-08-17 DIAGNOSIS — E7800 Pure hypercholesterolemia, unspecified: Secondary | ICD-10-CM | POA: Diagnosis not present

## 2022-08-17 DIAGNOSIS — H9313 Tinnitus, bilateral: Secondary | ICD-10-CM | POA: Diagnosis not present

## 2022-08-17 DIAGNOSIS — E559 Vitamin D deficiency, unspecified: Secondary | ICD-10-CM | POA: Diagnosis not present

## 2022-08-17 DIAGNOSIS — M810 Age-related osteoporosis without current pathological fracture: Secondary | ICD-10-CM | POA: Diagnosis not present

## 2022-08-17 DIAGNOSIS — K219 Gastro-esophageal reflux disease without esophagitis: Secondary | ICD-10-CM | POA: Diagnosis not present

## 2022-08-21 ENCOUNTER — Encounter (INDEPENDENT_AMBULATORY_CARE_PROVIDER_SITE_OTHER): Payer: Self-pay | Admitting: Nurse Practitioner

## 2022-08-21 NOTE — Progress Notes (Signed)
Subjective:    Patient ID: Alexandria Rodriguez, female    DOB: 1950/12/09, 72 y.o.   MRN: [id number] Chief Complaint  Patient presents with   Follow-up    Ref Fisher consult for carotid artery disease symptoms of claudicatio    The patient is seen for follow up evaluation of carotid stenosis. The carotid stenosis followed by ultrasound.   The patient denies amaurosis fugax. There is no recent history of TIA symptoms or focal motor deficits. There is no prior documented CVA.  The patient is taking enteric-coated aspirin 81 mg daily.  There is no history of migraine headaches. There is no history of seizures.  She has had recent claudication symptoms concerning for possible peripheral arterial disease given her smoking..  No history of rest pain symptoms. No new ulcers or wounds of the lower extremities have occurred.  There is no history of DVT, PE or superficial thrombophlebitis. No documented recent episodes of angina or shortness of breath documented.   Carotid Duplex done today shows 1-39% bilaterally.  No change compared to last study in 2022  Today the patient has an ABI of 1.15 on the right and 1.11 on the left.  She has a TBI 0.94 on the right and 0.93 on the left.  There are triphasic tibial artery waveforms bilaterally with normal toe waveforms bilaterally.    Review of Systems  All other systems reviewed and are negative.      Objective:   Physical Exam Vitals reviewed.  HENT:     Head: Normocephalic.  Cardiovascular:     Rate and Rhythm: Normal rate.     Pulses:          Dorsalis pedis pulses are 1+ on the right side and 1+ on the left side.       Posterior tibial pulses are 1+ on the right side and 1+ on the left side.  Pulmonary:     Effort: Pulmonary effort is normal.  Skin:    General: Skin is warm and dry.  Neurological:     Mental Status: She is alert and oriented to person, place, and time.  Psychiatric:        Mood and Affect: Mood normal.         Behavior: Behavior normal.        Thought Content: Thought content normal.        Judgment: Judgment normal.     BP (!) 140/78 (BP Location: Left Arm)   Pulse 84   Resp 16   Wt 130 lb 9.6 oz (59.2 kg)   BMI 23.89 kg/m   Past Medical History:  Diagnosis Date   Anxiety    Cataract    Cerebrovascular disease    Chronic pain    back pain   Contact dermatitis    COPD (chronic obstructive pulmonary disease) (HCC)    Depression    Diverticulosis 10/31/2010   On Tranverse colon   GERD (gastroesophageal reflux disease)    Humerus fracture 04/10/2016   Hyperlipidemia    Myasthenia gravis (HCC) 02/2013   Diagnosed by doctor Sherryll Burger   Neuropathy    Osteoporosis    Stroke [facility])    Vitamin D deficiency     Social History   Socioeconomic History   Marital status: Married    Spouse name: Alden Server   Number of children: 3   Years of education: Not on file   Highest education level: Some college, no degree  Occupational History   Occupation: disabled  Tobacco Use   Smoking status: Every Day    Packs/day: 0.50    Years: 42.00    Additional pack years: 0.00    Total pack years: 21.00    Types: Cigarettes   Smokeless tobacco: Never   Tobacco comments:     Has been smoking for 30+ years, Cigarettes per day;10.  Vaping Use   Vaping Use: Never used  Substance and Sexual Activity   Alcohol use: No    Alcohol/week: 0.0 standard drinks of alcohol   Drug use: No   Sexual activity: Not on file  Other Topics Concern   Not on file  Social History Narrative   Ambulates with walker rollator or using assitance holding on to walls or solid surfaces.    Social Determinants of Health   Financial Resource Strain: Low Risk  (03/21/2022)   Overall Financial Resource Strain (CARDIA)    Difficulty of Paying Living Expenses: Not hard at all  Food Insecurity: No Food Insecurity (06/12/2022)   Hunger Vital Sign    Worried About Running Out of Food in the Last Year: Never true    Ran Out of Food  in the Last Year: Never true  Transportation Needs: No Transportation Needs (06/12/2022)   PRAPARE - Administrator, Civil Service (Medical): No    Lack of Transportation (Non-Medical): No  Physical Activity: Inactive (03/21/2022)   Exercise Vital Sign    Days of Exercise per Week: 0 days    Minutes of Exercise per Session: 0 min  Stress: No Stress Concern Present (03/21/2022)   [employer] of Occupational Health - Occupational Stress Questionnaire    Feeling of Stress : Not at all  Social Connections: Socially Isolated (03/21/2022)   Social Connection and Isolation Panel [NHANES]    Frequency of Communication with Friends and Family: Once a week    Frequency of Social Gatherings with Friends and Family: Never    Attends Religious Services: Never    Database administrator or Organizations: No    Attends Banker Meetings: Never    Marital Status: Married  Catering manager Violence: Not At Risk (05/25/2022)   Humiliation, Afraid, Rape, and Kick questionnaire    Fear of Current or Ex-Partner: No    Emotionally Abused: No    Physically Abused: No    Sexually Abused: No    Past Surgical History:  Procedure Laterality Date   ABDOMINAL HYSTERECTOMY  2000   due to excessive bleeeding   CERVICAL CONE BIOPSY     Cervical discectomy and fusion  1999   [facility]   COLONOSCOPY WITH PROPOFOL N/A 12/06/2015   Procedure: COLONOSCOPY WITH PROPOFOL;  Surgeon: Scot Jun, MD;  Location: [facility] ENDOSCOPY;  Service: Endoscopy;  Laterality: N/A;   ESOPHAGOGASTRODUODENOSCOPY N/A 10/05/2014   Procedure: ESOPHAGOGASTRODUODENOSCOPY (EGD);  Surgeon: Scot Jun, MD;  Location: [facility] ENDOSCOPY;  Service: Endoscopy;  Laterality: N/A;   Repeat cuff surgery  2004   Developed adhesive capsulitis   ROTATOR CUFF REPAIR Right 2003   Shoulder    Family History  Problem Relation Age of Onset   Colon cancer Mother    Stroke Father    Cancer Sister         Ovarian, stable   Breast cancer Maternal Grandmother     Allergies  Allergen Reactions   Effexor Xr [Venlafaxine Hcl Er] Other (See Comments)    Reaction:  GI upset    Fosamax [Alendronate Sodium] Nausea And Vomiting  Percocet [Oxycodone-Acetaminophen] Swelling   Erythromycin Swelling and Rash    Pt states that her tongue swells.     Lidoderm [Lidocaine] Swelling and Rash   Morphine And Related Swelling, Rash and Other (See Comments)    Pt states that her tongue swells.   Pt states that her tongue swells.     Sulfa Antibiotics Swelling and Rash    TONGUE SWELLING   Sulfasalazine Rash and Swelling   Tramadol Swelling and Rash       Latest Ref Rng & Units 05/26/2022    2:19 AM 05/25/2022    7:47 PM 05/04/2022    1:45 PM  CBC  WBC 4.0 - 10.5 K/uL 5.3  8.0  4.4   Hemoglobin 12.0 - 15.0 g/dL 04.5  40.9  81.1   Hematocrit 36.0 - 46.0 % 33.1  36.8  35.0   Platelets 150 - 400 K/uL 136  153  231       CMP     Component Value Date/Time   NA 134 (L) 05/26/2022 0219   NA 143 05/04/2022 1345   NA 138 05/13/2013 1951   K 3.7 05/26/2022 0219   K 4.5 05/13/2013 1951   CL 104 05/26/2022 0219   CL 106 05/13/2013 1951   CO2 25 05/26/2022 0219   CO2 28 05/13/2013 1951   GLUCOSE 101 (H) 05/26/2022 0219   GLUCOSE 92 05/13/2013 1951   BUN 11 05/26/2022 0219   BUN 9 05/04/2022 1345   BUN 6 (L) 05/13/2013 1951   CREATININE 1.11 (H) 05/26/2022 0219   CREATININE 0.79 05/13/2013 1951   CALCIUM 8.0 (L) 05/26/2022 0219   CALCIUM 8.9 05/13/2013 1951   PROT 5.9 (L) 05/26/2022 0219   PROT 6.5 03/29/2021 1437   PROT 7.1 05/13/2013 1951   ALBUMIN 3.2 (L) 05/26/2022 0219   ALBUMIN 3.7 (L) 05/04/2022 1345   ALBUMIN 3.8 05/13/2013 1951   AST 15 05/26/2022 0219   AST 28 05/13/2013 1951   ALT 12 05/26/2022 0219   ALT 22 05/13/2013 1951   ALKPHOS 81 05/26/2022 0219   ALKPHOS 91 05/13/2013 1951   BILITOT 0.7 05/26/2022 0219   BILITOT 0.3 03/29/2021 1437   BILITOT 0.5 05/13/2013 1951    GFRNONAA 53 (L) 05/26/2022 0219   GFRNONAA >60 05/13/2013 1951   GFRAA 88 05/11/2020 1334   GFRAA >60 05/13/2013 1951     VAS [place] ABI WITH/WO TBI  Result Date: 08/03/2022  LOWER EXTREMITY DOPPLER STUDY Patient Name:  KALIROSE HEDRICH  Date of Exam:   08/02/2022 Medical Rec #: [id number]      Accession #:    [id number] Date of Birth: 10/19/1950       Patient Gender: F Patient Age:   57 years Exam Location:  [facility] Procedure:      VAS [place] ABI WITH/WO TBI Referring Phys: Weston Brass --------------------------------------------------------------------------------  Indications: Ulceration.  Performing Technologist: Debbe Bales RVS  Examination Guidelines: A complete evaluation includes at minimum, Doppler waveform signals and systolic blood pressure reading at the level of bilateral brachial, anterior tibial, and posterior tibial arteries, when vessel segments are accessible. Bilateral testing is considered an integral part of a complete examination. Photoelectric Plethysmograph (PPG) waveforms and toe systolic pressure readings are included as required and additional duplex testing as needed. Limited examinations for reoccurring indications may be performed as noted.  ABI Findings: +---------+------------------+-----+---------+--------+ Right    Rt Pressure (mmHg)IndexWaveform Comment  +---------+------------------+-----+---------+--------+ Brachial 156                                      +---------+------------------+-----+---------+--------+  ATA      170               1.09 triphasic         +---------+------------------+-----+---------+--------+ PTA      180               1.15 triphasic         +---------+------------------+-----+---------+--------+ Great Toe146               0.94 Normal            +---------+------------------+-----+---------+--------+ +---------+------------------+-----+---------+-------+ Left     Lt Pressure (mmHg)IndexWaveform Comment  +---------+------------------+-----+---------+-------+ Brachial 151                                     +---------+------------------+-----+---------+-------+ ATA      153               0.98 triphasic        +---------+------------------+-----+---------+-------+ PTA      173               1.11 triphasic        +---------+------------------+-----+---------+-------+ Great Toe145               0.93 Normal           +---------+------------------+-----+---------+-------+ +-------+-----------+-----------+------------+------------+ ABI/TBIToday's ABIToday's TBIPrevious ABIPrevious TBI +-------+-----------+-----------+------------+------------+ Right  1.15       .94                                 +-------+-----------+-----------+------------+------------+ Left   1.11       .93                                 +-------+-----------+-----------+------------+------------+  Summary: Right: Resting right ankle-brachial index is within normal range. The right toe-brachial index is normal. Left: Resting left ankle-brachial index is within normal range. The left toe-brachial index is normal. *See table(s) above for measurements and observations.  Electronically signed by Festus Barren MD on 08/03/2022 at 9:49:23 AM.    Final        Assessment & Plan:   1. Claudication [facility]) Recommend:  The patient has atypical pain symptoms for vascular disease and on exam I do not find evidence of vascular pathology that would explain the patient's symptoms.  Noninvasive studies do not identify significant vascular problems  I suspect the patient is c/o pseudoclaudication.  Patient should have an evaluation of the LS spine which I defer to the primary service or the Spine service.  The patient should continue walking and begin a more formal exercise program. The patient should continue his antiplatelet therapy and aggressive treatment of the lipid abnormalities.  Patient will follow-up with me on  a PRN basis.  2. Bilateral carotid artery stenosis Recommend:  Given the patient's asymptomatic subcritical stenosis no further invasive testing or surgery at this time.  Duplex ultrasound shows 1-39% stenosis bilaterally.  Continue antiplatelet therapy as prescribed Continue management of CAD, HTN and Hyperlipidemia Healthy heart diet,  encouraged exercise at least 4 times per week Follow up in 12 months with duplex ultrasound and physical exam   3. Tobacco use Smoking cessation was discussed, 3-10 minutes spent on this topic specifically  4. Gastroesophageal reflux disease, unspecified whether esophagitis present Continue PPI as already ordered,  this medication has been reviewed and there are no changes at this time.  Avoidence of caffeine and alcohol  Moderate elevation of the head of the bed    Current Outpatient Medications on File Prior to Visit  Medication Sig Dispense Refill   carisoprodol (SOMA) 350 MG tablet TAKE 1 TABLET BY MOUTH TWICE DAILY AS NEEDED (Patient taking differently: Take 350 mg by mouth 2 (two) times daily.) 60 tablet 3   Cholecalciferol (VITAMIN D3) 50 MCG (2000 UT) CAPS Take 2 capsules (4,000 Units total) by mouth daily.     clopidogrel (PLAVIX) 75 MG tablet TAKE 1 TABLET(75 MG) BY MOUTH DAILY 90 tablet 4   fluticasone (FLONASE) 50 MCG/ACT nasal spray Place 2 sprays into both nostrils daily. 16 g 3   fluticasone-salmeterol (ADVAIR) 250-50 MCG/ACT AEPB Inhale 1 puff into the lungs in the morning and at bedtime. 1 each 2   gabapentin (NEURONTIN) 100 MG capsule TAKE 2 CAPSULES(200 MG) BY MOUTH THREE TIMES DAILY IN ADDITION TO 600 MG GABAPENTIN TABLETS 180 capsule 4   gabapentin (NEURONTIN) 600 MG tablet TAKE 1 TABLET(600 MG) BY MOUTH TWICE DAILY 60 tablet 12   ibuprofen (ADVIL) 400 MG tablet Take 1 tablet (400 mg total) by mouth every 6 (six) hours as needed for headache, moderate pain, cramping or mild pain. 30 tablet 0   lansoprazole (PREVACID) 30 MG  capsule TAKE 1 CAPSULE(30 MG) BY MOUTH DAILY 30 capsule 5   mupirocin ointment (BACTROBAN) 2 % Apply 1 Application topically 2 (two) times daily. 30 g 0   nortriptyline (PAMELOR) 50 MG capsule TAKE 1 CAPSULE(50 MG) BY MOUTH AT BEDTIME 30 capsule 11   nystatin (MYCOSTATIN/NYSTOP) powder Apply 1 Application topically 3 (three) times daily. 60 g 0   rosuvastatin (CRESTOR) 20 MG tablet Take 20 mg by mouth daily.     triamcinolone ointment (KENALOG) 0.1 % APPLY EXTERNALLY TO THE AFFECTED AREA TWICE DAILY AS NEEDED 454 g 1   No current facility-administered medications on file prior to visit.    There are no Patient Instructions on file for this visit. No follow-ups on file.   Georgiana Spinner, NP

## 2022-08-23 ENCOUNTER — Ambulatory Visit (INDEPENDENT_AMBULATORY_CARE_PROVIDER_SITE_OTHER): Payer: Medicare Other

## 2022-08-23 DIAGNOSIS — E538 Deficiency of other specified B group vitamins: Secondary | ICD-10-CM

## 2022-08-23 MED ORDER — CYANOCOBALAMIN 1000 MCG/ML IJ SOLN
1000.0000 ug | Freq: Once | INTRAMUSCULAR | Status: AC
  Administered 2022-08-23: 1000 ug via INTRAMUSCULAR

## 2022-08-25 NOTE — Progress Notes (Signed)
  Care Coordination Note  08/25/2022 Name: Alexandria Rodriguez MRN: [id number] DOB: September 28, 1950  Alexandria Rodriguez is a 72 y.o. year old female who is a primary care patient of Sherrie Mustache, Demetrios Isaacs, MD and is actively engaged with the care management team. I reached out to Lynetta Mare by phone today to assist with re-scheduling a follow up visit with the RN Case Manager  Follow up plan: Telephone appointment with care management team member scheduled for: 09/11/2022  Burman Nieves, [facility] Care Coordination Care Guide Direct Dial: [phone]

## 2022-08-28 ENCOUNTER — Ambulatory Visit: Payer: Medicare Other | Admitting: Podiatry

## 2022-08-28 ENCOUNTER — Encounter: Payer: Self-pay | Admitting: Podiatry

## 2022-08-28 ENCOUNTER — Ambulatory Visit (INDEPENDENT_AMBULATORY_CARE_PROVIDER_SITE_OTHER): Payer: Medicare Other

## 2022-08-28 DIAGNOSIS — S92325D Nondisplaced fracture of second metatarsal bone, left foot, subsequent encounter for fracture with routine healing: Secondary | ICD-10-CM

## 2022-08-29 NOTE — Progress Notes (Signed)
She presents today for follow-up of her fracture metatarsal bases of her left foot.  States they are feeling so much better and the swelling has gone down.  She denies any pain.  She states that her neuropathy prevents a lot of pain.  Objective: Vital signs stable alert oriented x 3.  She has no erythema Dem salines drainage odor no pain on frontal plane range of motion direct palpation of the metatarsals.  Radiographs taken today do not demonstrate any type of osseous abnormalities appears to be healing very nicely at the bases of the metatarsals.  Assessment: Well-healing fractures metatarsal bases.  Plan: Follow-up with her as needed.

## 2022-08-31 ENCOUNTER — Other Ambulatory Visit: Payer: Self-pay | Admitting: Family Medicine

## 2022-08-31 ENCOUNTER — Other Ambulatory Visit: Payer: Self-pay

## 2022-08-31 DIAGNOSIS — S32010S Wedge compression fracture of first lumbar vertebra, sequela: Secondary | ICD-10-CM

## 2022-08-31 NOTE — Telephone Encounter (Signed)
Requested Prescriptions  Pending Prescriptions Disp Refills   gabapentin (NEURONTIN) 100 MG capsule [Pharmacy Med Name: GABAPENTIN 100MG  CAPSULES] 180 capsule 4    Sig: TAKE 2 CAPSULES(200 MG) BY MOUTH THREE TIMES DAILY IN ADDITION TO 600 MG GABAPENTIN TABLETS     Neurology: Anticonvulsants - gabapentin Failed - 08/31/2022  3:21 AM      Failed - Cr in normal range and within 360 days    Creatinine  Date Value Ref Range Status  05/13/2013 0.79 0.60 - 1.30 mg/dL Final   Creatinine, Ser  Date Value Ref Range Status  05/26/2022 1.11 (H) 0.44 - 1.00 mg/dL Final         Passed - Completed PHQ-2 or PHQ-9 in the last 360 days      Passed - Valid encounter within last 12 months    Recent Outpatient Visits           3 months ago Intertrigo   [facility] Alfredia Ferguson, PA-C   4 months ago Bilateral carotid artery stenosis   [facility] Malva Limes, MD   6 months ago B12 deficiency   [facility] Malva Limes, MD   7 months ago Urinary tract infection with hematuria, site unspecified   [facility] Malva Limes, MD   7 months ago B12 deficiency   [facility] Malva Limes, MD

## 2022-09-05 ENCOUNTER — Ambulatory Visit: Payer: Medicare Other | Admitting: Neurosurgery

## 2022-09-05 ENCOUNTER — Ambulatory Visit
Admission: RE | Admit: 2022-09-05 | Discharge: 2022-09-05 | Disposition: A | Discharge status: Home / Self Care | Payer: Medicare Other | Source: Ambulatory Visit | Attending: Neurosurgery | Admitting: Neurosurgery

## 2022-09-05 ENCOUNTER — Encounter: Payer: Self-pay | Admitting: Neurosurgery

## 2022-09-05 VITALS — BP 120/76 | Ht 62.0 in | Wt 129.8 lb

## 2022-09-05 DIAGNOSIS — S32010D Wedge compression fracture of first lumbar vertebra, subsequent encounter for fracture with routine healing: Secondary | ICD-10-CM | POA: Diagnosis not present

## 2022-09-05 DIAGNOSIS — W19XXXD Unspecified fall, subsequent encounter: Secondary | ICD-10-CM | POA: Diagnosis not present

## 2022-09-05 DIAGNOSIS — S32010S Wedge compression fracture of first lumbar vertebra, sequela: Secondary | ICD-10-CM | POA: Diagnosis not present

## 2022-09-05 DIAGNOSIS — S32018D Other fracture of first lumbar vertebra, subsequent encounter for fracture with routine healing: Secondary | ICD-10-CM

## 2022-09-05 DIAGNOSIS — S32010A Wedge compression fracture of first lumbar vertebra, initial encounter for closed fracture: Secondary | ICD-10-CM | POA: Diagnosis not present

## 2022-09-05 NOTE — Progress Notes (Signed)
Follow-up note: Referring Physician:  Malva Limes, MD [address] [place],  [place] [address]  Primary Physician:  Malva Limes, MD  Chief Complaint:  f/u of L1 compression fracture   History of Present Illness: Alexandria Rodriguez is a 72 year old presenting today for 2-week follow-up of L1 fracture.  Overall she is doing very well.  She states that she has not currently having any pain however when she is more active she does have some soreness in her mid to low back.  She denies any radiating symptoms.  She has not been wearing her brace.  06/13/22 Alexandria Rodriguez is a 72 y.o. female who presents today for hospital follow-up of L1 fracture. Overall she is doing well.  She does continue to have some back pain but states it is significantly improved since her hospital consult.  She continues to deny any radiating leg pain.  05/26/22 hospital consult note Alexandria Rodriguez is a 72 y.o presenting with acute low back pain after a fall on 05/25/22 around 4-5 pm. She states she was going to through something away in her trash can when she lost her balance and fell hurting her back and left foot. Today she reports mid to low back pain without pain that radiates into her legs. She denies any associated numbness or tingling.   Review of Systems:  A 10 point review of systems is negative, and the pertinent positives and negatives detailed in the HPI.  Past Medical History: Past Medical History:  Diagnosis Date   Anxiety    Cataract    Cerebrovascular disease    Chronic pain    back pain   Contact dermatitis    COPD (chronic obstructive pulmonary disease) (HCC)    Depression    Diverticulosis 10/31/2010   On Tranverse colon   GERD (gastroesophageal reflux disease)    Humerus fracture 04/10/2016   Hyperlipidemia    Myasthenia gravis (HCC) 02/2013   Diagnosed by doctor Sherryll Burger   Neuropathy    Osteoporosis    Stroke [facility])    Vitamin D deficiency     Past Surgical History: Past  Surgical History:  Procedure Laterality Date   ABDOMINAL HYSTERECTOMY  2000   due to excessive bleeeding   CERVICAL CONE BIOPSY     Cervical discectomy and fusion  1999   [facility]   COLONOSCOPY WITH PROPOFOL N/A 12/06/2015   Procedure: COLONOSCOPY WITH PROPOFOL;  Surgeon: Scot Jun, MD;  Location: [facility] ENDOSCOPY;  Service: Endoscopy;  Laterality: N/A;   ESOPHAGOGASTRODUODENOSCOPY N/A 10/05/2014   Procedure: ESOPHAGOGASTRODUODENOSCOPY (EGD);  Surgeon: Scot Jun, MD;  Location: [facility] ENDOSCOPY;  Service: Endoscopy;  Laterality: N/A;   Repeat cuff surgery  2004   Developed adhesive capsulitis   ROTATOR CUFF REPAIR Right 2003   Shoulder    Allergies: Allergies as of 09/05/2022 - Review Complete 08/28/2022  Allergen Reaction Noted   Effexor xr [venlafaxine hcl er] Other (See Comments) 02/01/2015   Fosamax [alendronate sodium] Nausea And Vomiting 02/01/2015   Percocet [oxycodone-acetaminophen] Swelling 04/10/2016   Erythromycin Swelling and Rash 02/01/2015   Lidoderm [lidocaine] Swelling and Rash 10/05/2014   Morphine and codeine Swelling, Rash, and Other (See Comments) 10/05/2014   Sulfa antibiotics Swelling and Rash 07/31/2013   Sulfasalazine Rash and Swelling 12/04/2014   Tramadol Swelling and Rash 12/04/2014    Medications: Outpatient Encounter Medications as of 09/05/2022  Medication Sig   carisoprodol (SOMA) 350 MG tablet TAKE 1 TABLET BY MOUTH TWICE  DAILY AS NEEDED (Patient taking differently: Take 350 mg by mouth 2 (two) times daily.)   Cholecalciferol (VITAMIN D3) 50 MCG (2000 UT) CAPS Take 2 capsules (4,000 Units total) by mouth daily.   clopidogrel (PLAVIX) 75 MG tablet TAKE 1 TABLET(75 MG) BY MOUTH DAILY   DULoxetine (CYMBALTA) 20 MG capsule TAKE 1 CAPSULE(20 MG) BY MOUTH DAILY   fluticasone (FLONASE) 50 MCG/ACT nasal spray Place 2 sprays into both nostrils daily.   fluticasone-salmeterol (ADVAIR) 250-50 MCG/ACT AEPB Inhale 1 puff into the  lungs in the morning and at bedtime.   gabapentin (NEURONTIN) 100 MG capsule TAKE 2 CAPSULES(200 MG) BY MOUTH THREE TIMES DAILY IN ADDITION TO 600 MG GABAPENTIN TABLETS   gabapentin (NEURONTIN) 600 MG tablet TAKE 1 TABLET(600 MG) BY MOUTH TWICE DAILY   ibuprofen (ADVIL) 400 MG tablet Take 1 tablet (400 mg total) by mouth every 6 (six) hours as needed for headache, moderate pain, cramping or mild pain.   lansoprazole (PREVACID) 30 MG capsule TAKE 1 CAPSULE(30 MG) BY MOUTH DAILY   mupirocin ointment (BACTROBAN) 2 % Apply 1 Application topically 2 (two) times daily.   nortriptyline (PAMELOR) 50 MG capsule TAKE 1 CAPSULE(50 MG) BY MOUTH AT BEDTIME   nystatin (MYCOSTATIN/NYSTOP) powder Apply 1 Application topically 3 (three) times daily.   rosuvastatin (CRESTOR) 20 MG tablet Take 20 mg by mouth daily.   triamcinolone ointment (KENALOG) 0.1 % APPLY EXTERNALLY TO THE AFFECTED AREA TWICE DAILY AS NEEDED   No facility-administered encounter medications on file as of 09/05/2022.    Social History: Social History   Tobacco Use   Smoking status: Every Day    Packs/day: 0.50    Years: 42.00    Additional pack years: 0.00    Total pack years: 21.00    Types: Cigarettes   Smokeless tobacco: Never   Tobacco comments:     Has been smoking for 30+ years, Cigarettes per day;10.  Vaping Use   Vaping Use: Never used  Substance Use Topics   Alcohol use: No    Alcohol/week: 0.0 standard drinks of alcohol   Drug use: No    Family Medical History: Family History  Problem Relation Age of Onset   Colon cancer Mother    Stroke Father    Cancer Sister        Ovarian, stable   Breast cancer Maternal Grandmother     Exam: Today's Vitals   09/05/22 1100  BP: 120/76  Weight: 58.9 kg  Height: 5\' 2"  (1.575 m)  PainSc: 0-No pain  PainLoc: Back   Body mass index is 23.74 kg/m.  General: A&O ROM of spine: limited due to pain.  Palpation of spine: TTP.   Strength: 5/5 throughout BLE. Ambulates  with the assistance of a cane  Imaging: 06/13/22 lumbar xrays  2 to 3 mm progression of L1 compression fracture without segmental kyphosis or concerning features.  09/05/22 lumbar xrays Stable appearing L1 compression fracture without any additional concerning features.  There seems to be sclerotic change at the superior endplate.  Assessment and Plan: Ms. Mukherjee is a pleasant 72 y.o. female presenting today for 36-month follow-up with L1 fracture.  Overall she is doing well with decrease in her back pain.  She does have some soreness in her back when she is more active.  She has since come out of her lower extremity boot.  We discussed activity restrictions for the next several months and I will see her back in 3 months with lumbar flex ex.  We discussed red flag symptoms and she was encouraged to call the office should she have any changes.  I spent a total of 20 minutes in both face-to-face and non-face-to-face activities for this visit on the date of this encounter including review of records, review of imaging, discussion of symptoms, physical exam, and documentation.  Manning Charity PA-C Neurosurgery

## 2022-09-06 ENCOUNTER — Other Ambulatory Visit: Payer: Self-pay | Admitting: Family Medicine

## 2022-09-07 ENCOUNTER — Other Ambulatory Visit: Payer: Self-pay | Admitting: Family Medicine

## 2022-09-11 ENCOUNTER — Ambulatory Visit: Payer: Self-pay | Admitting: *Deleted

## 2022-09-11 NOTE — Patient Outreach (Signed)
  Care Coordination   Follow Up Visit Note   09/11/2022 Name: Alexandria Rodriguez MRN: [id number] DOB: Feb 24, 1951  Alexandria Rodriguez is a 72 y.o. year old female who sees Fisher, Demetrios Isaacs, MD for primary care. I spoke with  Alexandria Rodriguez by phone today.  What matters to the patients health and wellness today?  Better manage neuropathy    Goals Addressed             This Visit's Progress    Free from recurrent falls   On track    Care Coordination Interventions: Provided written and verbal education re: potential causes of falls and Fall prevention strategies Reviewed medications and discussed potential side effects of medications such as dizziness and frequent urination Advised patient of importance of notifying provider of falls Assessed for falls since last encounter Provided patient information for fall alert systems Advised patient to discuss compression socks with provider Advised to call insurance carrier to inquire about covered life/fall alert systems - encouraged to call again Confirmed home health for PT/OT has completed Discussed pain management - using gabapentin currently, will ask PCP office for neurology or pain clinic referral          SDOH assessments and interventions completed:  No     Care Coordination Interventions:  Yes, provided   Interventions Today    Flowsheet Row Most Recent Value  Chronic Disease   Chronic disease during today's visit Other  [falls and neuropathy]  General Interventions   General Interventions Discussed/Reviewed General Interventions Reviewed, Doctor Visits, Durable Medical Equipment (DME)  [Report healed from recent fractures, still having pain due to neuropathy.]  Doctor Visits Discussed/Reviewed Doctor Visits Reviewed, Specialist, PCP  [facility] on 6/12 for B12 injection, neurosurgeon on 8/20]  Durable Medical Equipment (DME) Other, Walker  [Reminded to use walker/rollator/cane to help decrease risk of fall]  PCP/Specialist Visits  Compliance with follow-up visit  Safety Interventions   Safety Discussed/Reviewed Home Safety, Fall Risk, Safety Reviewed  Home Safety Assistive Devices  [HHPT completed]       Follow up plan: Follow up call scheduled for 7/9    Encounter Outcome:  Pt. Visit Completed   Kemper Durie, RN, MSN, [facility] Care Management Care Management Coordinator [phone]

## 2022-09-20 ENCOUNTER — Ambulatory Visit: Payer: Medicare Other

## 2022-09-27 ENCOUNTER — Ambulatory Visit (INDEPENDENT_AMBULATORY_CARE_PROVIDER_SITE_OTHER): Payer: Medicare Other

## 2022-09-27 DIAGNOSIS — E538 Deficiency of other specified B group vitamins: Secondary | ICD-10-CM

## 2022-09-27 MED ORDER — CYANOCOBALAMIN 1000 MCG/ML IJ SOLN
1000.0000 ug | Freq: Once | INTRAMUSCULAR | Status: AC
  Administered 2022-09-27: 1000 ug via INTRAMUSCULAR

## 2022-10-17 ENCOUNTER — Ambulatory Visit: Payer: Self-pay | Admitting: *Deleted

## 2022-10-17 NOTE — Patient Outreach (Signed)
  Care Coordination   Follow Up Visit Note   10/17/2022 Name: Alexandria Rodriguez MRN: [id number] DOB: Jan 23, 1951  Alexandria Rodriguez is a 72 y.o. year old female who sees Fisher, Demetrios Isaacs, MD for primary care. I spoke with  Lynetta Mare by phone today.  What matters to the patients health and wellness today?  Continue to be fall free and treat neuropathy    Goals Addressed             This Visit's Progress    Free from recurrent falls   On track    Care Coordination Interventions: Provided written and verbal education re: potential causes of falls and Fall prevention strategies Reviewed medications and discussed potential side effects of medications such as dizziness and frequent urination Advised patient of importance of notifying provider of falls Assessed for falls since last encounter Provided patient information for fall alert systems Advised patient to discuss compression socks with provider         SDOH assessments and interventions completed:  No     Care Coordination Interventions:  Yes, provided   Interventions Today    Flowsheet Row Most Recent Value  Chronic Disease   Chronic disease during today's visit Other  [Neuropathy and frequent falls]  General Interventions   General Interventions Discussed/Reviewed Doctor Visits, General Interventions Reviewed  Doctor Visits Discussed/Reviewed Doctor Visits Reviewed, PCP, Specialist  [B12 injecction 7/17, PCP 7/19, Neurosurgery 8/20]  PCP/Specialist Visits Compliance with follow-up visit  Education Interventions   Education Provided Provided Education  Provided Verbal Education On Medication, When to see the doctor, Other        Follow up plan: Follow up call scheduled for 8/22    Encounter Outcome:  Pt. Visit Completed   Kemper Durie, RN, MSN, [facility] Care Management Care Management Coordinator [phone]

## 2022-10-21 ENCOUNTER — Other Ambulatory Visit: Payer: Self-pay | Admitting: Family Medicine

## 2022-10-25 ENCOUNTER — Ambulatory Visit (INDEPENDENT_AMBULATORY_CARE_PROVIDER_SITE_OTHER): Payer: Medicare Other

## 2022-10-25 DIAGNOSIS — E538 Deficiency of other specified B group vitamins: Secondary | ICD-10-CM

## 2022-10-25 MED ORDER — CYANOCOBALAMIN 1000 MCG/ML IJ SOLN
1000.0000 ug | Freq: Once | INTRAMUSCULAR | Status: AC
  Administered 2022-10-25: 1000 ug via INTRAMUSCULAR

## 2022-10-25 NOTE — Progress Notes (Signed)
Tolerated injection well. Fu 4 wks.

## 2022-10-27 ENCOUNTER — Other Ambulatory Visit: Payer: Self-pay | Admitting: Family Medicine

## 2022-10-27 ENCOUNTER — Encounter: Payer: Self-pay | Admitting: Family Medicine

## 2022-10-27 ENCOUNTER — Telehealth: Payer: Self-pay | Admitting: Family Medicine

## 2022-10-27 ENCOUNTER — Ambulatory Visit (INDEPENDENT_AMBULATORY_CARE_PROVIDER_SITE_OTHER): Payer: Medicare Other | Admitting: Family Medicine

## 2022-10-27 VITALS — BP 108/72 | HR 80 | Temp 98.1°F | Resp 16 | Ht 62.0 in | Wt 130.2 lb

## 2022-10-27 DIAGNOSIS — R296 Repeated falls: Secondary | ICD-10-CM

## 2022-10-27 DIAGNOSIS — H6122 Impacted cerumen, left ear: Secondary | ICD-10-CM

## 2022-10-27 DIAGNOSIS — Z8673 Personal history of transient ischemic attack (TIA), and cerebral infarction without residual deficits: Secondary | ICD-10-CM | POA: Diagnosis not present

## 2022-10-27 DIAGNOSIS — J449 Chronic obstructive pulmonary disease, unspecified: Secondary | ICD-10-CM

## 2022-10-27 DIAGNOSIS — E538 Deficiency of other specified B group vitamins: Secondary | ICD-10-CM

## 2022-10-27 DIAGNOSIS — M810 Age-related osteoporosis without current pathological fracture: Secondary | ICD-10-CM

## 2022-10-27 DIAGNOSIS — N182 Chronic kidney disease, stage 2 (mild): Secondary | ICD-10-CM | POA: Diagnosis not present

## 2022-10-27 DIAGNOSIS — E559 Vitamin D deficiency, unspecified: Secondary | ICD-10-CM

## 2022-10-27 DIAGNOSIS — R051 Acute cough: Secondary | ICD-10-CM

## 2022-10-27 DIAGNOSIS — G250 Essential tremor: Secondary | ICD-10-CM | POA: Diagnosis not present

## 2022-10-27 DIAGNOSIS — G629 Polyneuropathy, unspecified: Secondary | ICD-10-CM

## 2022-10-27 MED ORDER — DULOXETINE HCL 40 MG PO CPEP: 40.0000 mg | ORAL_CAPSULE | Freq: Every day | ORAL | 3 refills | Status: DC

## 2022-10-27 NOTE — Progress Notes (Unsigned)
Established patient visit   Patient: Alexandria Rodriguez   DOB: 06-30-1950   72 y.o. Female  MRN: [id number] Visit Date: 10/27/2022  Today's healthcare provider: Mila Merry, MD   Chief Complaint  Patient presents with   Medical Management of Chronic Issues   Subjective    Discussed the use of AI scribe software for clinical note transcription with the patient, who gave verbal consent to proceed.  History of Present Illness   The patient presents for general follow up. She reported a fall that resulted in hospitalization and rehabilitation, during which she broke three bones in her foot. The patient continues to experience aching in both feet, which she attributes to neuropathy rather than the previous injury. Despite regular x-rays, no further issues have been identified in the foot. The patient has had two additional falls since her hospitalization, both times tripping over her boot and walker, but without any further broken bones.  The patient is currently on Cymbalta (duloxetine) 20mg  and Gabapentin for her neuropathy, but reports no noticeable improvement in her symptoms. She has previously tried Lyrica, which also did not provide any relief. The patient is taking 600mg  of Gabapentin in the morning and at night, but still experiences significant pain in her feet.  The patient also reported a recent issue with water trapped in her ear, which she has been unable to remove. She has not previously used any ear drops for this issue.    Additionally, the patient expressed concern about her partner's memory, which has been noticeably declining over the past month. Her partner struggles with directions and remembering more than one task at a time. The patient inquired about over-the-counter memory aids.       Medications: Outpatient Medications Prior to Visit  Medication Sig   carisoprodol (SOMA) 350 MG tablet Take 1 tablet (350 mg total) by mouth 2 (two) times daily.    Cholecalciferol (VITAMIN D3) 50 MCG (2000 UT) CAPS Take 2 capsules (4,000 Units total) by mouth daily.   clopidogrel (PLAVIX) 75 MG tablet TAKE 1 TABLET(75 MG) BY MOUTH DAILY   fluticasone-salmeterol (ADVAIR) 250-50 MCG/ACT AEPB Inhale 1 puff into the lungs in the morning and at bedtime.   gabapentin (NEURONTIN) 100 MG capsule TAKE 2 CAPSULES(200 MG) BY MOUTH THREE TIMES DAILY IN ADDITION TO 600 MG GABAPENTIN TABLETS   gabapentin (NEURONTIN) 600 MG tablet TAKE 1 TABLET(600 MG) BY MOUTH TWICE DAILY   ibuprofen (ADVIL) 400 MG tablet Take 1 tablet (400 mg total) by mouth every 6 (six) hours as needed for headache, moderate pain, cramping or mild pain.   lansoprazole (PREVACID) 30 MG capsule TAKE 1 CAPSULE(30 MG) BY MOUTH DAILY   nortriptyline (PAMELOR) 50 MG capsule TAKE 1 CAPSULE(50 MG) BY MOUTH AT BEDTIME   nystatin (MYCOSTATIN/NYSTOP) powder Apply 1 Application topically 3 (three) times daily.   rosuvastatin (CRESTOR) 20 MG tablet TAKE 1 TABLET(20 MG) BY MOUTH DAILY   triamcinolone ointment (KENALOG) 0.1 % APPLY EXTERNALLY TO THE AFFECTED AREA TWICE DAILY AS NEEDED   [DISCONTINUED] DULoxetine (CYMBALTA) 20 MG capsule TAKE 1 CAPSULE(20 MG) BY MOUTH DAILY   [DISCONTINUED] fluticasone (FLONASE) 50 MCG/ACT nasal spray Place 2 sprays into both nostrils daily.   [DISCONTINUED] mupirocin ointment (BACTROBAN) 2 % Apply 1 Application topically 2 (two) times daily.   No facility-administered medications prior to visit.  (optional):1}   Objective    BP 108/72 (BP Location: Left Arm, Patient Position: Sitting, Cuff Size: Normal)   Pulse 80  Temp 98.1 F (36.7 C) (Temporal)   Resp 16   Ht 5\' 2"  (1.575 m)   Wt 130 lb 3.2 oz (59.1 kg)   SpO2 96%   BMI 23.81 kg/m   Physical Exam   HEENT: Excessive cerumen in left ear canal.    No results found for any visits on 10/27/22.  Assessment & Plan     Assessment and Plan    Peripheral Neuropathy: Severe pain in feet, limiting mobility. Currently on  gabapentin (600mg  + 300mg  TID) and duloxetine (20mg  daily). Failed pregabalin in the past. Duloxetine dose may be subtherapeutic. -Increase duloxetine to 40mg  daily.  Falls: Two falls since hospitalization, both related to wearing a boot for foot fractures. No new fractures reported. -Continue to monitor and manage neuropathy to potentially reduce fall risk.  Excessive cerumen: Complaint of water trapped in ear, likely related to excessive cerumen.  -OTC Debrox drops to help clear wax and trapped water.  Vitamin B12 Deficiency: On B12 injectionss -Check B12 levels in today's lab work.  Osteoporosis: Intolerant to oral bisphosphonates: Previous endocrinologist (Dr. Johny Chess) has retired. Patient's insurance did not cover prescribed insulin. -Attempt to refer to Dr. Gershon Crane, who may now be accepting new patients. -If travel to [facility] is feasible, consider referral to endocrinologists in that area.          Mila Merry, MD  [facility] [phone] (phone) [phone] (fax)  [facility]

## 2022-10-27 NOTE — Telephone Encounter (Signed)
Requested medication (s) are due for refill today: routing for review  Requested medication (s) are on the active medication list: no  Last refill:  11/09/21  Future visit scheduled: no  Notes to clinic:  Unable to refill per protocol, Rx expired. Medication is not on current list, routing for approval.      Requested Prescriptions  Pending Prescriptions Disp Refills   WIXELA INHUB 250-50 MCG/ACT AEPB [Pharmacy Med Name: WIXELA INHUB DISKUS 250/50MCG 60S] 60 each     Sig: INHALE 1 PUFF INTO THE LUNGS IN THE MORNING AND AT BEDTIME     Pulmonology:  Combination Products Passed - 10/27/2022 11:42 AM      Passed - Valid encounter within last 12 months    Recent Outpatient Visits           Today B12 deficiency   [facility] Malva Limes, MD   5 months ago Intertrigo   [facility] Edwards, [place], PA-C   5 months ago Bilateral carotid artery stenosis   [facility] Malva Limes, MD   8 months ago B12 deficiency   [facility] Malva Limes, MD   8 months ago Urinary tract infection with hematuria, site unspecified   [facility] Malva Limes, MD

## 2022-10-27 NOTE — Telephone Encounter (Signed)
[facility] pharmacy is requesting prior authorization Key: [id number] Name: [place] DULoxetine HCI 40MG  DR Capsules

## 2022-10-27 NOTE — Patient Instructions (Addendum)
Please review the attached list of medications and notify my office if there are any errors.   Fill left ear canal with OTC Debrox ear drops at lease once every night until cleared

## 2022-10-28 LAB — COMPREHENSIVE METABOLIC PANEL
ALT: 9 IU/L (ref 0–32)
AST: 14 IU/L (ref 0–40)
Albumin: 4.2 g/dL (ref 3.8–4.8)
Alkaline Phosphatase: 87 IU/L (ref 44–121)
BUN/Creatinine Ratio: 11 — ABNORMAL LOW (ref 12–28)
BUN: 12 mg/dL (ref 8–27)
Bilirubin Total: 0.2 mg/dL (ref 0.0–1.2)
CO2: 22 mmol/L (ref 20–29)
Calcium: 8.8 mg/dL (ref 8.7–10.3)
Chloride: 105 mmol/L (ref 96–106)
Creatinine, Ser: 1.09 mg/dL — ABNORMAL HIGH (ref 0.57–1.00)
Globulin, Total: 2.2 g/dL (ref 1.5–4.5)
Glucose: 96 mg/dL (ref 70–99)
Potassium: 4.6 mmol/L (ref 3.5–5.2)
Sodium: 141 mmol/L (ref 134–144)
Total Protein: 6.4 g/dL (ref 6.0–8.5)
eGFR: 54 mL/min/{1.73_m2} — ABNORMAL LOW (ref >59)

## 2022-10-28 LAB — VITAMIN D 25 HYDROXY (VIT D DEFICIENCY, FRACTURES): Vit D, 25-Hydroxy: 10.8 ng/mL — ABNORMAL LOW (ref 30.0–100.0)

## 2022-10-28 LAB — CBC
Hematocrit: 34 % (ref 34.0–46.6)
Hemoglobin: 11.4 g/dL (ref 11.1–15.9)
MCH: 32.9 pg (ref 26.6–33.0)
MCHC: 33.5 g/dL (ref 31.5–35.7)
MCV: 98 fL — ABNORMAL HIGH (ref 79–97)
Platelets: 201 10*3/uL (ref 150–450)
RBC: 3.46 x10E6/uL — ABNORMAL LOW (ref 3.77–5.28)
RDW: 13.2 % (ref 11.7–15.4)
WBC: 5.7 10*3/uL (ref 3.4–10.8)

## 2022-10-28 LAB — TSH: TSH: 1.87 u[IU]/mL (ref 0.450–4.500)

## 2022-10-28 LAB — VITAMIN B12: Vitamin B-12: 1712 pg/mL — ABNORMAL HIGH (ref 232–1245)

## 2022-10-28 LAB — LIPID PANEL
Chol/HDL Ratio: 1.9 ratio (ref 0.0–4.4)
Cholesterol, Total: 120 mg/dL (ref 100–199)
HDL: 62 mg/dL (ref >39)
LDL Chol Calc (NIH): 45 mg/dL (ref 0–99)
Triglycerides: 62 mg/dL (ref 0–149)
VLDL Cholesterol Cal: 13 mg/dL (ref 5–40)

## 2022-11-01 ENCOUNTER — Telehealth: Payer: Self-pay

## 2022-11-01 MED ORDER — DULOXETINE HCL 20 MG PO CPEP: 40.0000 mg | ORAL_CAPSULE | Freq: Every day | ORAL | 3 refills | Status: DC

## 2022-11-01 NOTE — Telephone Encounter (Signed)
[place]) PA Case ID #: [id number] Rx #: [id number] Denied on July 19 Request Reference Number: [id number]. DULOXETINE CAP 40MG  is denied for not meeting the prior authorization requirement(s). Details of this decision are in the notice attached below or have been faxed to you.

## 2022-11-01 NOTE — Telephone Encounter (Addendum)
Please advise patient that insurance denied the 40mg  duloxetine. They previously approved the 20mg , so have sent in new prescription for 20 mg tablets to take two tablets daily

## 2022-11-01 NOTE — Telephone Encounter (Signed)
Decision Notes: DULOXETINE CAP 40MG  is denied because it is not on your plan's Drug List (formulary). Medication authorization requires the following: One of the following: (1) You need to try one (1) of these covered drugs: (a) Diclofenac potassium 50mg . (b) Diflunisal. (c) Etodolac capsule. (2) Your doctor needs to give [place] specific medical reasons why one (1) of the covered drug(s) is not appropriate for you.

## 2022-11-01 NOTE — Telephone Encounter (Signed)
Patient advised of lab results. She is wanting to know if she needs to continue coming in monthly for B12 injections. Please advise.  (10/27/22) 6 mo ago (05/04/22) 1 yr ago (03/29/21) 2 yr ago (05/11/20) 2 yr ago (03/12/20) 4 yr ago (06/18/18) 4 yr ago (03/15/18)  Vitamin B-12 232 - 1,245 pg/mL 1,712 High

## 2022-11-01 NOTE — Telephone Encounter (Signed)
Patient advised.

## 2022-11-02 ENCOUNTER — Other Ambulatory Visit: Payer: Self-pay | Admitting: Family Medicine

## 2022-11-02 DIAGNOSIS — R051 Acute cough: Secondary | ICD-10-CM

## 2022-11-02 NOTE — Telephone Encounter (Signed)
NA. Mail box is full 

## 2022-11-02 NOTE — Telephone Encounter (Signed)
She can reduce to every 2 months. Her levels were high because she just had a shot 2 days before blood was drawn.

## 2022-11-03 ENCOUNTER — Telehealth: Payer: Self-pay | Admitting: Family Medicine

## 2022-11-03 DIAGNOSIS — R051 Acute cough: Secondary | ICD-10-CM

## 2022-11-03 MED ORDER — FLUTICASONE-SALMETEROL 250-50 MCG/ACT IN AEPB: 1.0000 | INHALATION_SPRAY | Freq: Two times a day (BID) | RESPIRATORY_TRACT | 3 refills | Status: DC

## 2022-11-03 NOTE — Telephone Encounter (Signed)
Rx request is too soon. Last refill 10/30/22, duplicate request.  Requested Prescriptions  Pending Prescriptions Disp Refills   WIXELA INHUB 250-50 MCG/ACT AEPB [Pharmacy Med Name: WIXELA INHUB DISKUS 250/50MCG 60S] 60 each 3    Sig: INHALE 1 PUFF INTO THE LUNGS IN THE MORNING AND AT BEDTIME     Pulmonology:  Combination Products Passed - 11/02/2022  4:11 PM      Passed - Valid encounter within last 12 months    Recent Outpatient Visits           1 week ago B12 deficiency   [facility] Malva Limes, MD   5 months ago Intertrigo   [facility] Edwards, [place], PA-C   6 months ago Bilateral carotid artery stenosis   [facility] Malva Limes, MD   8 months ago B12 deficiency   [facility] Malva Limes, MD   9 months ago Urinary tract infection with hematuria, site unspecified   [facility] Malva Limes, MD       Future Appointments             In 3 months Fisher, Demetrios Isaacs, MD [facility]

## 2022-11-03 NOTE — Telephone Encounter (Signed)
Call to pharmacy- they do not have Rx on file - will need to resend Rx.  Rx resent to pharmacy for patient.

## 2022-11-03 NOTE — Telephone Encounter (Signed)
Patient states that her pharmacy did not receive the rx for fluticasone-salmeterol [facility] INHUB) 250-50 MCG/ACT AEPB on 10/30/22. Please resend to  [id number] - [place], [place] - [address] AT [facility] [address] [facility] Phone: [phone]  Fax: [phone]

## 2022-11-07 ENCOUNTER — Other Ambulatory Visit: Payer: Self-pay | Admitting: Family Medicine

## 2022-11-08 NOTE — Telephone Encounter (Signed)
Requested medication (s) are due for refill today: Yes  Requested medication (s) are on the active medication list: Yes  Last refill:  09/07/22 #60, 1RF  Future visit scheduled: Yes  Notes to clinic:  Unable to refill per protocol, cannot delegate.      Requested Prescriptions  Pending Prescriptions Disp Refills   carisoprodol (SOMA) 350 MG tablet [Pharmacy Med Name: CARISOPRODOL 350MG  TABLETS] 60 tablet     Sig: TAKE 1 TABLET(350 MG) BY MOUTH TWICE DAILY     Not Delegated - Analgesics:  Muscle Relaxants Failed - 11/07/2022 10:35 AM      Failed - This refill cannot be delegated      Passed - Valid encounter within last 6 months    Recent Outpatient Visits           1 week ago B12 deficiency   [facility] Malva Limes, MD   5 months ago Intertrigo   [facility] [place] Edwards, [place], PA-C   6 months ago Bilateral carotid artery stenosis   [facility] Malva Limes, MD   8 months ago B12 deficiency   [facility] Malva Limes, MD   9 months ago Urinary tract infection with hematuria, site unspecified   [facility] Malva Limes, MD       Future Appointments             In 3 months Fisher, Demetrios Isaacs, MD [facility]

## 2022-11-10 NOTE — Telephone Encounter (Signed)
Patient advised. Will come in every 2 months.

## 2022-11-22 ENCOUNTER — Ambulatory Visit: Payer: Medicare Other

## 2022-11-28 ENCOUNTER — Ambulatory Visit: Payer: Medicare Other | Admitting: Neurosurgery

## 2022-11-30 ENCOUNTER — Ambulatory Visit: Payer: Self-pay | Admitting: *Deleted

## 2022-11-30 NOTE — Patient Outreach (Signed)
  Care Coordination   Follow Up Visit Note   11/30/2022 Name: Alexandria Rodriguez MRN: [id number] DOB: [birth year]  Alexandria Rodriguez is a 72 y.o. year old female who sees Fisher, Demetrios Isaacs, MD for primary care. I spoke with  Alexandria Rodriguez by phone today.  What matters to the patients health and wellness today?  Decrease in neuropathy pain    Goals Addressed             This Visit's Progress    Free from recurrent falls   On track    Care Coordination Interventions: Provided written and verbal education re: potential causes of falls and Fall prevention strategies Reviewed medications and discussed potential side effects of medications such as dizziness and frequent urination Advised patient of importance of notifying provider of falls Assessed for falls since last encounter Provided patient information for fall alert systems Advised patient to discuss compression socks with provider         SDOH assessments and interventions completed:  No     Care Coordination Interventions:  Yes, provided   Interventions Today    Flowsheet Row Most Recent Value  Chronic Disease   Chronic disease during today's visit Other  [neuropathy]  General Interventions   General Interventions Discussed/Reviewed General Interventions Reviewed, Doctor Visits  Doctor Visits Discussed/Reviewed Doctor Visits Reviewed, PCP, Specialist  [neurosurgeon8/27, PCP 11/20]  PCP/Specialist Visits Compliance with follow-up visit  Education Interventions   Education Provided Provided Education  Provided Verbal Education On Medication, When to see the doctor, Foot Care  [Report increase in Duloxetine from 20 mg to 40 mg in the last month to help with neuropathy pain.  Discussed non-pharmacological ways to treat pain as well]       Follow up plan: Follow up call scheduled for 10/2    Encounter Outcome:  Pt. Visit Completed   Kemper Durie, RN, MSN, [facility] Care Management Care Management Coordinator [phone]

## 2022-12-04 ENCOUNTER — Other Ambulatory Visit: Payer: Self-pay

## 2022-12-04 DIAGNOSIS — S32018D Other fracture of first lumbar vertebra, subsequent encounter for fracture with routine healing: Secondary | ICD-10-CM

## 2022-12-05 ENCOUNTER — Encounter: Payer: Self-pay | Admitting: Neurosurgery

## 2022-12-05 ENCOUNTER — Ambulatory Visit
Admission: RE | Admit: 2022-12-05 | Discharge: 2022-12-05 | Disposition: A | Discharge status: Home / Self Care | Payer: Medicare Other | Attending: Neurosurgery | Admitting: Neurosurgery

## 2022-12-05 ENCOUNTER — Ambulatory Visit: Payer: Medicare Other | Admitting: Neurosurgery

## 2022-12-05 ENCOUNTER — Ambulatory Visit
Admission: RE | Admit: 2022-12-05 | Discharge: 2022-12-05 | Disposition: A | Discharge status: Home / Self Care | Payer: Medicare Other | Source: Ambulatory Visit | Attending: Neurosurgery | Admitting: Neurosurgery

## 2022-12-05 VITALS — BP 118/74 | Ht 63.0 in | Wt 129.0 lb

## 2022-12-05 DIAGNOSIS — G609 Hereditary and idiopathic neuropathy, unspecified: Secondary | ICD-10-CM | POA: Diagnosis not present

## 2022-12-05 DIAGNOSIS — M47817 Spondylosis without myelopathy or radiculopathy, lumbosacral region: Secondary | ICD-10-CM | POA: Diagnosis not present

## 2022-12-05 DIAGNOSIS — W19XXXD Unspecified fall, subsequent encounter: Secondary | ICD-10-CM

## 2022-12-05 DIAGNOSIS — M25511 Pain in right shoulder: Secondary | ICD-10-CM | POA: Diagnosis not present

## 2022-12-05 DIAGNOSIS — S32018D Other fracture of first lumbar vertebra, subsequent encounter for fracture with routine healing: Secondary | ICD-10-CM

## 2022-12-05 DIAGNOSIS — S32019A Unspecified fracture of first lumbar vertebra, initial encounter for closed fracture: Secondary | ICD-10-CM | POA: Diagnosis not present

## 2022-12-05 DIAGNOSIS — R251 Tremor, unspecified: Secondary | ICD-10-CM

## 2022-12-05 DIAGNOSIS — S32010D Wedge compression fracture of first lumbar vertebra, subsequent encounter for fracture with routine healing: Secondary | ICD-10-CM | POA: Diagnosis not present

## 2022-12-05 DIAGNOSIS — M8580 Other specified disorders of bone density and structure, unspecified site: Secondary | ICD-10-CM | POA: Diagnosis not present

## 2022-12-05 DIAGNOSIS — M5126 Other intervertebral disc displacement, lumbar region: Secondary | ICD-10-CM | POA: Diagnosis not present

## 2022-12-05 NOTE — Progress Notes (Signed)
Follow-up note: Referring Physician:  No referring provider defined for this encounter.  Primary Physician:  Malva Limes, MD  Chief Complaint:  f/u of L1 compression fracture   History of Present Illness: Alexandria Rodriguez is a 72 y.o presenting today for 36-month follow-up of L1 fracture.  She has had significant improvement of her pain and only gets some pain across her back with standing for prolonged periods of time to do things such as dishes.  This improves with sitting.  Largely she is pleased with her improvement in regards to her fracture thus far. Her primary concerns today are a couple of days of acute worsening right shoulder pain.  She has a history of rotator cuff surgery in the past.  She is having trouble with reaching overhead and carrying objects.   Additionally to this she has a longstanding history of peripheral neuropathy and tremors.  She is seeking to establish with a new neurologist.  09/05/22 Alexandria Rodriguez is a 72 year old presenting today for 2-week follow-up of L1 fracture.  Overall she is doing very well.  She states that she has not currently having any pain however when she is more active she does have some soreness in her mid to low back.  She denies any radiating symptoms.  She has not been wearing her brace.  06/13/22 Alexandria Rodriguez is a 72 y.o. female who presents today for hospital follow-up of L1 fracture. Overall she is doing well.  She does continue to have some back pain but states it is significantly improved since her hospital consult.  She continues to deny any radiating leg pain.  05/26/22 hospital consult note Alexandria Rodriguez is a 72 y.o presenting with acute low back pain after a fall on 05/25/22 around 4-5 pm. She states she was going to through something away in her trash can when she lost her balance and fell hurting her back and left foot. Today she reports mid to low back pain without pain that radiates into her legs. She denies any associated numbness  or tingling.   Review of Systems:  A 10 point review of systems is negative, and the pertinent positives and negatives detailed in the HPI.  Past Medical History: Past Medical History:  Diagnosis Date   Anxiety    Cataract    Cerebrovascular disease    Chronic pain    back pain   Contact dermatitis    COPD (chronic obstructive pulmonary disease) (HCC)    Depression    Diverticulosis 10/31/2010   On Tranverse colon   GERD (gastroesophageal reflux disease)    Humerus fracture 04/10/2016   Hyperlipidemia    Myasthenia gravis (HCC) 02/2013   Diagnosed by doctor Sherryll Burger   Neuropathy    Osteoporosis    Stroke [facility])    Vitamin D deficiency     Past Surgical History: Past Surgical History:  Procedure Laterality Date   ABDOMINAL HYSTERECTOMY  2000   due to excessive bleeeding   CERVICAL CONE BIOPSY     Cervical discectomy and fusion  1999   [facility]   COLONOSCOPY WITH PROPOFOL N/A 12/06/2015   Procedure: COLONOSCOPY WITH PROPOFOL;  Surgeon: Scot Jun, MD;  Location: [facility] ENDOSCOPY;  Service: Endoscopy;  Laterality: N/A;   ESOPHAGOGASTRODUODENOSCOPY N/A 10/05/2014   Procedure: ESOPHAGOGASTRODUODENOSCOPY (EGD);  Surgeon: Scot Jun, MD;  Location: [facility] ENDOSCOPY;  Service: Endoscopy;  Laterality: N/A;   Repeat cuff surgery  2004   Developed adhesive capsulitis   ROTATOR CUFF  REPAIR Right 2003   Shoulder    Allergies: Allergies as of 12/05/2022 - Review Complete 12/05/2022  Allergen Reaction Noted   Effexor xr [venlafaxine hcl er] Other (See Comments) 02/01/2015   Fosamax [alendronate sodium] Nausea And Vomiting 02/01/2015   Percocet [oxycodone-acetaminophen] Swelling 04/10/2016   Erythromycin Swelling and Rash 02/01/2015   Lidoderm [lidocaine] Swelling and Rash 10/05/2014   Morphine and codeine Swelling, Rash, and Other (See Comments) 10/05/2014   Sulfa antibiotics Swelling and Rash 07/31/2013   Sulfasalazine Rash and Swelling 12/04/2014    Tramadol Swelling and Rash 12/04/2014    Medications: Outpatient Encounter Medications as of 12/05/2022  Medication Sig   carisoprodol (SOMA) 350 MG tablet TAKE 1 TABLET(350 MG) BY MOUTH TWICE DAILY   Cholecalciferol (VITAMIN D3) 50 MCG (2000 UT) CAPS Take 2 capsules (4,000 Units total) by mouth daily.   clopidogrel (PLAVIX) 75 MG tablet TAKE 1 TABLET(75 MG) BY MOUTH DAILY   DULoxetine (CYMBALTA) 20 MG capsule Take 2 capsules (40 mg total) by mouth daily.   fluticasone-salmeterol (WIXELA INHUB) 250-50 MCG/ACT AEPB Inhale 1 puff into the lungs 2 (two) times daily. in the morning and at bedtime.   gabapentin (NEURONTIN) 100 MG capsule TAKE 2 CAPSULES(200 MG) BY MOUTH THREE TIMES DAILY IN ADDITION TO 600 MG GABAPENTIN TABLETS   gabapentin (NEURONTIN) 600 MG tablet TAKE 1 TABLET(600 MG) BY MOUTH TWICE DAILY   ibuprofen (ADVIL) 400 MG tablet Take 1 tablet (400 mg total) by mouth every 6 (six) hours as needed for headache, moderate pain, cramping or mild pain.   lansoprazole (PREVACID) 30 MG capsule TAKE 1 CAPSULE(30 MG) BY MOUTH DAILY   nortriptyline (PAMELOR) 50 MG capsule TAKE 1 CAPSULE(50 MG) BY MOUTH AT BEDTIME   nystatin (MYCOSTATIN/NYSTOP) powder Apply 1 Application topically 3 (three) times daily.   rosuvastatin (CRESTOR) 20 MG tablet TAKE 1 TABLET(20 MG) BY MOUTH DAILY   triamcinolone ointment (KENALOG) 0.1 % APPLY EXTERNALLY TO THE AFFECTED AREA TWICE DAILY AS NEEDED   No facility-administered encounter medications on file as of 12/05/2022.    Social History: Social History   Tobacco Use   Smoking status: Every Day    Current packs/day: 0.50    Average packs/day: 0.5 packs/day for 42.0 years (21.0 ttl pk-yrs)    Types: Cigarettes   Smokeless tobacco: Never   Tobacco comments:     Has been smoking for 30+ years, Cigarettes per day;10.  Vaping Use   Vaping status: Never Used  Substance Use Topics   Alcohol use: No    Alcohol/week: 0.0 standard drinks of alcohol   Drug use: No     Family Medical History: Family History  Problem Relation Age of Onset   Colon cancer Mother    Stroke Father    Cancer Sister        Ovarian, stable   Breast cancer Maternal Grandmother     Exam: Today's Vitals   12/05/22 1135  BP: 118/74  Weight: 58.5 kg  Height: 5\' 3"  (1.6 m)  PainSc: 1   PainLoc: Back   Body mass index is 22.85 kg/m.  General: A&O ROM of spine: limited due to pain.  Palpation of spine: TTP.   Strength: 5/5 throughout BLE. Ambulates with the assistance of a cane  Imaging: 12/05/22 lumbar xrays Stable appearing L1 compression fracture without dynamic motion on flex ex.  06/13/22 lumbar xrays  2 to 3 mm progression of L1 compression fracture without segmental kyphosis or concerning features.  09/05/22 lumbar xrays Stable appearing L1  compression fracture without any additional concerning features.  There seems to be sclerotic change at the superior endplate.  Assessment and Plan: Ms. Selke is a pleasant 72 y.o. female presenting today follow-up with L1 fracture.  She has done very well and has had significant improvement of her back pain.  She is tolerating regular activities.  I have placed a referral to orthopedics for her right shoulder pain and to [facility] neurology for evaluation and treatment of her neuropathy as well as her tremors.  We will see her going forward on an as-needed basis.  She expressed understanding and was in agreement with this plan.  I spent a total of 20 minutes in both face-to-face and non-face-to-face activities for this visit on the date of this encounter including review of records, review of imaging, discussion of symptoms, physical exam, and documentation.  Manning Charity PA-C Neurosurgery

## 2022-12-20 ENCOUNTER — Ambulatory Visit (INDEPENDENT_AMBULATORY_CARE_PROVIDER_SITE_OTHER): Payer: Medicare Other

## 2022-12-20 DIAGNOSIS — E538 Deficiency of other specified B group vitamins: Secondary | ICD-10-CM

## 2022-12-20 DIAGNOSIS — M75121 Complete rotator cuff tear or rupture of right shoulder, not specified as traumatic: Secondary | ICD-10-CM | POA: Diagnosis not present

## 2022-12-20 DIAGNOSIS — M25511 Pain in right shoulder: Secondary | ICD-10-CM | POA: Diagnosis not present

## 2022-12-20 MED ORDER — CYANOCOBALAMIN 1000 MCG/ML IJ SOLN
1000.0000 ug | Freq: Once | INTRAMUSCULAR | Status: AC
Start: 2022-12-20 — End: 2022-12-20
  Administered 2022-12-20: 1000 ug via INTRAMUSCULAR

## 2023-01-10 ENCOUNTER — Ambulatory Visit: Payer: Self-pay | Admitting: *Deleted

## 2023-01-10 NOTE — Patient Outreach (Signed)
Care Coordination   Follow Up Visit Note   01/10/2023 Name: SAILOR KALLENBERGER MRN: [id number] DOB: 1950-07-27  CHANTICE COLDEN is a 72 y.o. year old female who sees Fisher, Demetrios Isaacs, MD for primary care. I spoke with  Lynetta Mare by phone today.  What matters to the patients health and wellness today?  Find new neurologist and treatment of rotator cuff tears     Goals Addressed             This Visit's Progress    Free from recurrent falls   Not on track    Care Coordination Interventions: Provided written and verbal education re: potential causes of falls and Fall prevention strategies Reviewed medications and discussed potential side effects of medications such as dizziness and frequent urination Advised patient of importance of notifying provider of falls Assessed for falls since last encounter Provided patient information for fall alert systems Advised patient to discuss compression socks with provider         SDOH assessments and interventions completed:  No     Care Coordination Interventions:  Yes, provided   Interventions Today    Flowsheet Row Most Recent Value  Chronic Disease   Chronic disease during today's visit Other  [recurrent falls causing rotator cuff tears, neuropathy]  General Interventions   General Interventions Discussed/Reviewed General Interventions Reviewed, Doctor Visits, Communication with  [Received cortisone shots in right shoulder, follow up with ortho for treatment for left (not a candidate for surgery)]  Doctor Visits Discussed/Reviewed Doctor Visits Reviewed, PCP, Specialist  PCP/Specialist Visits Compliance with follow-up visit, Contact provider for referral to  [facility] provider for referral to Specialist  [neurology]  Communication with PCP/Specialists  [facility] sent to neurosurgeon to collaborate for new neurologist]  Education Interventions   Education Provided Provided Education  Provided Verbal Education On Medication, When to  see the doctor  Safety Interventions   Safety Discussed/Reviewed Fall Risk, Safety Reviewed       Follow up plan: Follow up call scheduled for 10/23    Encounter Outcome:  Patient Visit Completed   Kemper Durie, RN, MSN, [facility] Care Management Care Management Coordinator [phone]

## 2023-01-19 ENCOUNTER — Other Ambulatory Visit: Payer: Self-pay | Admitting: Family Medicine

## 2023-01-19 NOTE — Telephone Encounter (Signed)
Requested Prescriptions  Pending Prescriptions Disp Refills   lansoprazole (PREVACID) 30 MG capsule [Pharmacy Med Name: LANSOPRAZOLE 30MG  DR CAPSULES] 30 capsule 5    Sig: TAKE 1 CAPSULE(30 MG) BY MOUTH DAILY     Gastroenterology: Proton Pump Inhibitors 2 Passed - 01/19/2023  9:35 AM      Passed - ALT in normal range and within 360 days    ALT  Date Value Ref Range Status  10/27/2022 9 0 - 32 IU/L Final   SGPT (ALT)  Date Value Ref Range Status  05/13/2013 22 12 - 78 U/L Final         Passed - AST in normal range and within 360 days    AST  Date Value Ref Range Status  10/27/2022 14 0 - 40 IU/L Final   SGOT(AST)  Date Value Ref Range Status  05/13/2013 28 15 - 37 Unit/L Final         Passed - Valid encounter within last 12 months    Recent Outpatient Visits           2 months ago B12 deficiency   [facility] Malva Limes, MD   8 months ago Intertrigo   [facility] Edwards, [place], PA-C   8 months ago Bilateral carotid artery stenosis   [facility] Malva Limes, MD   10 months ago B12 deficiency   [facility] Malva Limes, MD   11 months ago Urinary tract infection with hematuria, site unspecified   [facility] Malva Limes, MD       Future Appointments             In 1 month Fisher, Demetrios Isaacs, MD [facility]

## 2023-01-29 DIAGNOSIS — M7582 Other shoulder lesions, left shoulder: Secondary | ICD-10-CM | POA: Diagnosis not present

## 2023-01-31 ENCOUNTER — Ambulatory Visit: Payer: Self-pay | Admitting: *Deleted

## 2023-01-31 NOTE — Patient Outreach (Addendum)
Care Coordination   01/31/2023 Name: Alexandria Rodriguez MRN: [id number] DOB: 05-11-1950   Care Coordination Outreach Attempts:  An unsuccessful telephone outreach was attempted for a scheduled appointment today. X 2 attempts  Follow Up Plan:  Additional outreach attempts will be made to offer the patient care coordination information and services.   Encounter Outcome:  No Answer   Care Coordination Interventions:  No, not indicated    Kemper Durie RN, MSN, CCM [facility], [facility] RN Care Coordinator Direct Dial: [phone] / Main [phone] Fax [phone] Email: [email or web address] Website: [email or web address]

## 2023-02-15 DIAGNOSIS — H2513 Age-related nuclear cataract, bilateral: Secondary | ICD-10-CM | POA: Diagnosis not present

## 2023-02-15 DIAGNOSIS — H40033 Anatomical narrow angle, bilateral: Secondary | ICD-10-CM | POA: Diagnosis not present

## 2023-02-16 ENCOUNTER — Other Ambulatory Visit: Payer: Self-pay | Admitting: Family Medicine

## 2023-02-21 ENCOUNTER — Ambulatory Visit (INDEPENDENT_AMBULATORY_CARE_PROVIDER_SITE_OTHER): Payer: Medicare Other

## 2023-02-21 DIAGNOSIS — E538 Deficiency of other specified B group vitamins: Secondary | ICD-10-CM | POA: Diagnosis not present

## 2023-02-21 MED ORDER — CYANOCOBALAMIN 1000 MCG/ML IJ SOLN
1000.0000 ug | Freq: Once | INTRAMUSCULAR | Status: AC
Start: 2023-02-21 — End: 2023-02-21
  Administered 2023-02-21: 1000 ug via INTRAMUSCULAR

## 2023-02-21 NOTE — Progress Notes (Signed)
Patient tolerated b12 injection well. She is due to come back April 18, 2023.

## 2023-02-28 ENCOUNTER — Ambulatory Visit: Payer: Medicare Other | Admitting: Family Medicine

## 2023-03-05 ENCOUNTER — Other Ambulatory Visit: Payer: Self-pay | Admitting: Family Medicine

## 2023-03-05 ENCOUNTER — Encounter: Payer: Self-pay | Admitting: Family Medicine

## 2023-03-05 ENCOUNTER — Ambulatory Visit (INDEPENDENT_AMBULATORY_CARE_PROVIDER_SITE_OTHER): Payer: Medicare Other | Admitting: Family Medicine

## 2023-03-05 VITALS — BP 108/74 | HR 94 | Resp 16 | Wt 129.0 lb

## 2023-03-05 DIAGNOSIS — M810 Age-related osteoporosis without current pathological fracture: Secondary | ICD-10-CM | POA: Diagnosis not present

## 2023-03-05 DIAGNOSIS — Z23 Encounter for immunization: Secondary | ICD-10-CM | POA: Diagnosis not present

## 2023-03-05 DIAGNOSIS — G629 Polyneuropathy, unspecified: Secondary | ICD-10-CM | POA: Diagnosis not present

## 2023-03-05 DIAGNOSIS — G250 Essential tremor: Secondary | ICD-10-CM

## 2023-03-05 DIAGNOSIS — E559 Vitamin D deficiency, unspecified: Secondary | ICD-10-CM | POA: Diagnosis not present

## 2023-03-05 DIAGNOSIS — R270 Ataxia, unspecified: Secondary | ICD-10-CM | POA: Diagnosis not present

## 2023-03-05 DIAGNOSIS — E538 Deficiency of other specified B group vitamins: Secondary | ICD-10-CM

## 2023-03-05 DIAGNOSIS — R296 Repeated falls: Secondary | ICD-10-CM | POA: Diagnosis not present

## 2023-03-06 LAB — VITAMIN D 25 HYDROXY (VIT D DEFICIENCY, FRACTURES): Vit D, 25-Hydroxy: 10.9 ng/mL — ABNORMAL LOW (ref 30.0–100.0)

## 2023-03-06 NOTE — Progress Notes (Signed)
Established patient visit   Patient: Alexandria Rodriguez   DOB: [birth year]   72 y.o. Female  MRN: [id number] Visit Date: 03/05/2023  Today's healthcare provider: Mila Merry, MD   Chief Complaint  Patient presents with   Medical Management of Chronic Issues   Subjective    Discussed the use of AI scribe software for clinical note transcription with the patient, who gave verbal consent to proceed.  History of Present Illness   The patient, with a history of vitamin D deficiency, hyperlipidemia, and neuropathy presents for a routine checkup and follow-up on vitamin D levels. She has been taking vitamin D supplements (5000 IU) daily, with no reported side effects.  The patient continues to experience significant discomfort in her feet, which she describes as a constant, 24/7 issue. The pain is so severe that she can only stand for about ten minutes at a time and has to elevate her feet on a pillow when sitting. The patient reports that her right foot is more painful than the left, and she has noticed her big toe is going under the next toe.  She is currently on gabapentin (100 mg, three times a day), soma, and duloxetine 2 x 20mg  daily for pain management. However, she reports no significant improvement in her symptoms with these medications.  The patient also mentions difficulty in finding a neurologist for further management of her neurological condition. She had a previous consultation at [facility], where she was told that her brain is not communicating effectively with her body. She also mentions a condition with a Spanish name and a medication advertised as B Y B G A R T, but she struggles to recall further details.  The patient is also on B12 injections, taken every other month. She is due for dental work soon and is currently on Plavix, which she knows she needs to stop five days before the procedure. She has received her flu shot and is considering getting vaccines for shingles and  pneumonia.       Medications: Outpatient Medications Prior to Visit  Medication Sig   carisoprodol (SOMA) 350 MG tablet TAKE 1 TABLET(350 MG) BY MOUTH TWICE DAILY   Cholecalciferol (VITAMIN D3) 125 MCG (5000 UT) TABS Take 5,000 Units by mouth daily.   clopidogrel (PLAVIX) 75 MG tablet TAKE 1 TABLET(75 MG) BY MOUTH DAILY   DULoxetine (CYMBALTA) 20 MG capsule Take 2 capsules (40 mg total) by mouth daily.   fluticasone-salmeterol (WIXELA INHUB) 250-50 MCG/ACT AEPB Inhale 1 puff into the lungs 2 (two) times daily. in the morning and at bedtime.   gabapentin (NEURONTIN) 100 MG capsule TAKE 2 CAPSULES(200 MG) BY MOUTH THREE TIMES DAILY IN ADDITION TO 600 MG GABAPENTIN TABLETS   gabapentin (NEURONTIN) 600 MG tablet TAKE 1 TABLET(600 MG) BY MOUTH TWICE DAILY   ibuprofen (ADVIL) 400 MG tablet Take 1 tablet (400 mg total) by mouth every 6 (six) hours as needed for headache, moderate pain, cramping or mild pain.   lansoprazole (PREVACID) 30 MG capsule TAKE 1 CAPSULE(30 MG) BY MOUTH DAILY   nortriptyline (PAMELOR) 50 MG capsule TAKE 1 CAPSULE(50 MG) BY MOUTH AT BEDTIME   nystatin (MYCOSTATIN/NYSTOP) powder Apply 1 Application topically 3 (three) times daily.   rosuvastatin (CRESTOR) 20 MG tablet TAKE 1 TABLET(20 MG) BY MOUTH DAILY   triamcinolone ointment (KENALOG) 0.1 % APPLY EXTERNALLY TO THE AFFECTED AREA TWICE DAILY AS NEEDED   [DISCONTINUED] Cholecalciferol (VITAMIN D3) 50 MCG (2000 UT) CAPS Take 2 capsules (  4,000 Units total) by mouth daily.   No facility-administered medications prior to visit.   Review of Systems     Objective    BP 108/74 (BP Location: Left Arm, Patient Position: Sitting, Cuff Size: Normal)   Pulse 94   Resp 16   Wt 129 lb (58.5 kg)   SpO2 98%   BMI 22.85 kg/m   Physical Exam  General: Appearance:    Well developed, well nourished female in no acute distress. Sitting comfortable in wheelchair. Unable to stand or walk without assistance.   Eyes:    PERRL,  conjunctiva/corneas clear, EOM's intact       Lungs:     Clear to auscultation bilaterally, respirations unlabored  Heart:    Normal heart rate. Normal rhythm. No murmurs, rubs, or gallops.    MS:   All extremities are intact.    Neurologic:   Awake, alert, oriented x 3. No apparent focal neurological defect.         Assessment & Plan        Vitamin D Deficiency Previously low levels, currently on Vitamin D 5000 IU daily. No reported side effects. -Check Vitamin D levels today.  Peripheral Neuropathy Constant pain in both feet, worse in the right foot. Currently on Gabapentin 800mg  TID, Soma, and Duloxetine with minimal relief. Difficulty with ambulation and foot positioning noted. With history of recurrent falls.  -Continue current medications. Was referred to [facility] neurology by her neurosurgeon, but [facility] refused to accept referral.  -Attempt to arrange referral to a different neurologist.   Dental Procedure Upcoming dental procedure requiring temporary cessation of Plavix. -Stop Plavix 5 days prior to dental procedure as per previous communication with dentist.  General Health Maintenance -Received flu shot. -Recommended to receive Shingles and Pneumococcal vaccines at the pharmacy.         Mila Merry, MD  [facility] [phone] (phone) [phone] (fax)  [facility]

## 2023-03-06 NOTE — Patient Instructions (Signed)
.   Please review the attached list of medications and notify my office if there are any errors.   . Please bring all of your medications to every appointment so we can make sure that our medication list is the same as yours.   

## 2023-03-15 ENCOUNTER — Other Ambulatory Visit: Payer: Self-pay | Admitting: Family Medicine

## 2023-03-15 DIAGNOSIS — R051 Acute cough: Secondary | ICD-10-CM

## 2023-03-16 NOTE — Telephone Encounter (Signed)
Requested Prescriptions  Pending Prescriptions Disp Refills   WIXELA INHUB 250-50 MCG/ACT AEPB [Pharmacy Med Name: WIXELA INHUB DISKUS 250/50MCG 60S] 60 each 3    Sig: INHALE 1 PUFF INTO THE LUNGS TWICE DAILY IN THE MORNING AND AT BEDTIME     Pulmonology:  Combination Products Passed - 03/15/2023 12:52 PM      Passed - Valid encounter within last 12 months    Recent Outpatient Visits           1 week ago Polyneuropathy [facility])    [facility] Malva Limes, MD   4 months ago B12 deficiency   [facility] Malva Limes, MD   10 months ago Intertrigo   [facility] Edwards, [place], PA-C   10 months ago Bilateral carotid artery stenosis   [facility] Malva Limes, MD   1 year ago B12 deficiency   [facility] Malva Limes, MD

## 2023-04-05 ENCOUNTER — Other Ambulatory Visit: Payer: Self-pay | Admitting: Family Medicine

## 2023-04-16 ENCOUNTER — Other Ambulatory Visit: Payer: Self-pay | Admitting: Family Medicine

## 2023-04-17 NOTE — Telephone Encounter (Signed)
 Requested Prescriptions  Pending Prescriptions Disp Refills   rosuvastatin  (CRESTOR ) 20 MG tablet [Pharmacy Med Name: ROSUVASTATIN  20MG  TABLETS] 90 tablet 1    Sig: TAKE 1 TABLET(20 MG) BY MOUTH DAILY     Cardiovascular:  Antilipid - Statins 2 Failed - 04/17/2023  7:54 PM      Failed - Cr in normal range and within 360 days    Creatinine  Date Value Ref Range Status  05/13/2013 0.79 0.60 - 1.30 mg/dL Final   Creatinine, Ser  Date Value Ref Range Status  10/27/2022 1.09 (H) 0.57 - 1.00 mg/dL Final         Failed - Lipid Panel in normal range within the last 12 months    Cholesterol, Total  Date Value Ref Range Status  10/27/2022 120 100 - 199 mg/dL Final   Cholesterol  Date Value Ref Range Status  05/14/2013 138 0 - 200 mg/dL Final   Ldl Cholesterol, Calc  Date Value Ref Range Status  05/14/2013 85 0 - 100 mg/dL Final   LDL Chol Calc (NIH)  Date Value Ref Range Status  10/27/2022 45 0 - 99 mg/dL Final   HDL Cholesterol  Date Value Ref Range Status  05/14/2013 36 (L) 40 - 60 mg/dL Final   HDL  Date Value Ref Range Status  10/27/2022 62 >39 mg/dL Final   Triglycerides  Date Value Ref Range Status  10/27/2022 62 0 - 149 mg/dL Final  97/95/7984 84 0 - 200 mg/dL Final         Passed - Patient is not pregnant      Passed - Valid encounter within last 12 months    Recent Outpatient Visits           1 month ago Polyneuropathy (HCC)   [facility] Gasper Nancyann BRAVO, MD   5 months ago B12 deficiency   [facility] Gasper Nancyann BRAVO, MD   11 months ago Intertrigo   [facility] Cyndi, [place], PA-C   11 months ago Bilateral carotid artery stenosis   [facility] Gasper Nancyann BRAVO, MD   1 year ago B12 deficiency   [facility] Gasper Nancyann BRAVO, MD

## 2023-04-18 ENCOUNTER — Ambulatory Visit: Payer: Medicare Other

## 2023-04-19 ENCOUNTER — Encounter: Payer: Self-pay | Admitting: Acute Care

## 2023-04-25 ENCOUNTER — Ambulatory Visit (INDEPENDENT_AMBULATORY_CARE_PROVIDER_SITE_OTHER): Payer: Medicare Other | Admitting: Family Medicine

## 2023-04-25 DIAGNOSIS — E538 Deficiency of other specified B group vitamins: Secondary | ICD-10-CM | POA: Diagnosis not present

## 2023-04-25 MED ORDER — CYANOCOBALAMIN 1000 MCG/ML IJ SOLN
1000.0000 ug | Freq: Once | INTRAMUSCULAR | Status: AC
  Administered 2023-04-25: 1000 ug via INTRAMUSCULAR

## 2023-04-25 NOTE — Progress Notes (Signed)
 Patient tolerated B-12 injection well.

## 2023-05-15 ENCOUNTER — Telehealth: Payer: Self-pay | Admitting: *Deleted

## 2023-05-15 NOTE — Progress Notes (Signed)
 Complex Care Management Care Guide Note  05/15/2023 Name: EMBRY MANRIQUE MRN: [id number] DOB: 28-Oct-1950  Alexandria Rodriguez is a 73 y.o. year old female who is a primary care patient of Gasper, Nancyann FORBES, MD and is actively engaged with the care management team. I reached out to Dagoberto FORBES Griffin by phone today to assist with scheduling  with the RN Case Manager.  Follow up plan: Unsuccessful telephone outreach attempt made. A HIPAA compliant phone message was left for the patient providing contact information and requesting a return call.  Thedford Franks, CMA, Care Guide [facility]  [facility], [facility] Guide Direct Dial: [phone]  Fax: [phone] Website: [email or web address]

## 2023-05-16 ENCOUNTER — Other Ambulatory Visit: Payer: Self-pay | Admitting: Physician Assistant

## 2023-05-16 DIAGNOSIS — L304 Erythema intertrigo: Secondary | ICD-10-CM

## 2023-05-16 DIAGNOSIS — M545 Low back pain, unspecified: Secondary | ICD-10-CM | POA: Diagnosis not present

## 2023-05-16 DIAGNOSIS — M8448XA Pathological fracture, other site, initial encounter for fracture: Secondary | ICD-10-CM | POA: Diagnosis not present

## 2023-05-16 DIAGNOSIS — M7582 Other shoulder lesions, left shoulder: Secondary | ICD-10-CM | POA: Diagnosis not present

## 2023-05-16 DIAGNOSIS — M778 Other enthesopathies, not elsewhere classified: Secondary | ICD-10-CM | POA: Diagnosis not present

## 2023-05-17 NOTE — Telephone Encounter (Signed)
 Requested medication (s) are due for refill today: Yes  Requested medication (s) are on the active medication list: No-discontinued 10/27/22  Last refill:  05/16/22  Future visit scheduled: No  Notes to clinic:  Unable to refill due to no refill protocol for this medication.      Requested Prescriptions  Pending Prescriptions Disp Refills   mupirocin  ointment (BACTROBAN ) 2 % [Pharmacy Med Name: MUPIROCIN  2% OINTMENT 15GM] 30 g 0    Sig: APPLY TOPICALLY TO THE AFFECTED AREA TWICE DAILY     Off-Protocol Failed - 05/17/2023 12:43 PM      Failed - Medication not assigned to a protocol, review manually.      Passed - Valid encounter within last 12 months    Recent Outpatient Visits           3 weeks ago B12 deficiency   [facility] Gasper Nancyann BRAVO, MD   2 months ago Polyneuropathy [facility])   [facility] Gasper Nancyann BRAVO, MD   6 months ago B12 deficiency   [facility] Gasper Nancyann BRAVO, MD   1 year ago Intertrigo   [facility] Cyndi Shaver, PA-C   1 year ago Bilateral carotid artery stenosis   [facility] Gasper Nancyann BRAVO, MD

## 2023-05-18 NOTE — Progress Notes (Signed)
 Complex Care Management Care Guide Note  05/18/2023 Name: Alexandria Rodriguez MRN: [id number] DOB: [birth year]  Alexandria Rodriguez is a 73 y.o. year old female who is a primary care patient of Gasper, Nancyann FORBES, MD and is actively engaged with the care management team. I reached out to Alexandria Rodriguez by phone today to assist with scheduling  with the RN Case Manager.  Follow up plan: Unsuccessful telephone outreach attempt made. A HIPAA compliant phone message was left for the patient providing contact information and requesting a return call.  Alexandria Rodriguez, CMA, Care Guide [facility]  [facility], [facility] Guide Direct Dial: [phone]  Fax: [phone] Website: [email or web address]

## 2023-05-21 NOTE — Progress Notes (Signed)
 Complex Care Management Care Guide Note  05/21/2023 Name: DELSEY FIEDOR MRN: [id number] DOB: 1951/01/28  Alexandria Rodriguez is a 73 y.o. year old female who is a primary care patient of Shann Darnel, Erlinda Haws, MD and is actively engaged with the care management team. I reached out to Patria Bookbinder by phone today to assist with scheduling  with the RN Case Manager.  Follow up plan: 3rd Unsuccessful telephone outreach attempt made. A HIPAA compliant phone message was left for the patient providing contact information and requesting a return call.  Kandis Ormond, CMA, Care Guide [facility]  [facility], [facility] Guide Direct Dial: [phone]  Fax: [phone] Website: [email or web address]

## 2023-06-12 ENCOUNTER — Telehealth: Payer: Self-pay | Admitting: Family Medicine

## 2023-06-12 NOTE — Telephone Encounter (Signed)
 Medical Release form from [employer] being sent back for completion. Patient was told it would be toward the end of next week before form was completed.  Call Patient when completed and she will come get form  [phone].

## 2023-06-17 NOTE — Telephone Encounter (Signed)
 Completed and signed, please advise pt and leave at front desk for her to pick up.

## 2023-06-18 NOTE — Telephone Encounter (Signed)
 Patient advised. Reports will pick up the form Wednesday.

## 2023-06-19 ENCOUNTER — Other Ambulatory Visit: Payer: Self-pay | Admitting: Family Medicine

## 2023-06-19 DIAGNOSIS — H9313 Tinnitus, bilateral: Secondary | ICD-10-CM

## 2023-07-11 ENCOUNTER — Other Ambulatory Visit: Payer: Self-pay | Admitting: Family Medicine

## 2023-07-20 ENCOUNTER — Other Ambulatory Visit: Payer: Self-pay | Admitting: Family Medicine

## 2023-07-26 ENCOUNTER — Other Ambulatory Visit: Payer: Self-pay | Admitting: Family Medicine

## 2023-07-31 ENCOUNTER — Encounter (INDEPENDENT_AMBULATORY_CARE_PROVIDER_SITE_OTHER): Payer: Medicare Other

## 2023-07-31 ENCOUNTER — Ambulatory Visit (INDEPENDENT_AMBULATORY_CARE_PROVIDER_SITE_OTHER): Payer: Medicare Other | Admitting: Vascular Surgery

## 2023-08-24 ENCOUNTER — Other Ambulatory Visit: Payer: Self-pay | Admitting: Family Medicine

## 2023-08-24 DIAGNOSIS — H9313 Tinnitus, bilateral: Secondary | ICD-10-CM

## 2023-08-24 NOTE — Telephone Encounter (Signed)
 Copied from CRM [id number]. Topic: Clinical - Medication Refill >> Aug 24, 2023  9:23 AM Everlene Hobby D wrote: Medication: nortriptyline  (PAMELOR ) 50 MG capsule  Has the patient contacted their pharmacy? No (Agent: If no, request that the patient contact the pharmacy for the refill. If patient does not wish to contact the pharmacy document the reason why and proceed with request.) (Agent: If yes, when and what did the pharmacy advise?)  This is the [facility]'s preferred [facility] [address] [place] [address]    Is this the correct pharmacy for this prescription? Yes If no, delete pharmacy and type the correct one.   Has the prescription been filled recently? No  Is the patient out of the medication? Yes  Has the patient been seen for an appointment in the last year OR does the patient have an upcoming appointment? Yes  Can we respond through MyChart? No  Agent: Please be advised that Rx refills may take up to 3 business days. We ask that you follow-up with your pharmacy. >> Aug 24, 2023  9:25 AM Everlene Hobby D wrote: Patient would like all her and her husband's medications to be sent  [facility]   Address: [address], [place] [address] Phone: [phone] Hours:  Open ? Closes 10?PM Pharmacy: Open ? Closes 1:30?PM ? Reopens 2?PM  More hours

## 2023-08-27 MED ORDER — NORTRIPTYLINE HCL 50 MG PO CAPS: 50.0000 mg | ORAL_CAPSULE | Freq: Every day | ORAL | 0 refills | Status: DC

## 2023-08-27 NOTE — Telephone Encounter (Signed)
 Requested Prescriptions  Pending Prescriptions Disp Refills   nortriptyline  (PAMELOR ) 50 MG capsule 30 capsule 11    Sig: 1 capsule (50 mg total).     Psychiatry:  Antidepressants - Heterocyclics (TCAs) Failed - 08/27/2023  2:37 PM      Failed - Valid encounter within last 6 months    Recent Outpatient Visits   None            Passed - Completed PHQ-2 or PHQ-9 in the last 360 days

## 2023-08-28 ENCOUNTER — Other Ambulatory Visit: Payer: Self-pay | Admitting: Family Medicine

## 2023-08-28 DIAGNOSIS — G629 Polyneuropathy, unspecified: Secondary | ICD-10-CM

## 2023-08-28 MED ORDER — DULOXETINE HCL 20 MG PO CPEP: 40.0000 mg | ORAL_CAPSULE | Freq: Every day | ORAL | 0 refills | Status: DC

## 2023-08-28 NOTE — Telephone Encounter (Signed)
 Requested Prescriptions  Pending Prescriptions Disp Refills   DULoxetine  (CYMBALTA ) 20 MG capsule 180 capsule 0    Sig: Take 2 capsules (40 mg total) by mouth daily. TAKE 2 CAPSULES(40 MG) BY MOUTH DAILY     Psychiatry: Antidepressants - SNRI - duloxetine  Failed - 08/28/2023  2:31 PM      Failed - Cr in normal range and within 360 days    Creatinine  Date Value Ref Range Status  05/13/2013 0.79 0.60 - 1.30 mg/dL Final   Creatinine, Ser  Date Value Ref Range Status  10/27/2022 1.09 (H) 0.57 - 1.00 mg/dL Final         Failed - Valid encounter within last 6 months    Recent Outpatient Visits   None            Passed - eGFR is 30 or above and within 360 days    EGFR (African American)  Date Value Ref Range Status  05/13/2013 >60  Final   GFR calc Af Amer  Date Value Ref Range Status  05/11/2020 88 >59 mL/min/1.73 Final    Comment:    **In accordance with recommendations from the NKF-ASN Task force,**   [facility] is in the process of updating its eGFR calculation to the   2021 CKD-EPI creatinine equation that estimates kidney function   without a race variable.    EGFR (Non-African Amer.)  Date Value Ref Range Status  05/13/2013 >60  Final    Comment:    eGFR values <73mL/min/1.73 m2 may be an indication of chronic kidney disease (CKD). Calculated eGFR is useful in patients with stable renal function. The eGFR calculation will not be reliable in acutely ill patients when serum creatinine is changing rapidly. It is not useful in  patients on dialysis. The eGFR calculation may not be applicable to patients at the low and high extremes of body sizes, pregnant women, and vegetarians.    GFR, Estimated  Date Value Ref Range Status  05/26/2022 53 (L) >60 mL/min Final    Comment:    (NOTE) Calculated using the CKD-EPI Creatinine Equation (2021)    eGFR  Date Value Ref Range Status  10/27/2022 54 (L) >59 mL/min/1.73 Final         Passed - Completed PHQ-2 or PHQ-9 in  the last 360 days      Passed - Last BP in normal range    BP Readings from Last 1 Encounters:  03/05/23 108/74

## 2023-08-28 NOTE — Telephone Encounter (Signed)
 Copied from CRM [id number]. Topic: Clinical - Medication Refill >> Aug 28, 2023  2:14 PM Donald Frost wrote: Medication: DULoxetine  (CYMBALTA ) 20 MG capsule  Has the patient contacted their pharmacy? Yes   This is the patient's preferred pharmacy:  [id number] [place] [place] - [address] [place] [address] Phone: [phone] Fax: [phone]  Is this the correct pharmacy for this prescription? Yes If no, delete pharmacy and type the correct one.   Has the prescription been filled recently? No  Is the patient out of the medication? Yes she took her last pill today  Has the patient been seen for an appointment in the last year OR does the patient have an upcoming appointment? Yes  Can we respond through MyChart? No  Please assist patient further

## 2023-09-06 ENCOUNTER — Other Ambulatory Visit (INDEPENDENT_AMBULATORY_CARE_PROVIDER_SITE_OTHER): Payer: Self-pay | Admitting: Nurse Practitioner

## 2023-09-06 DIAGNOSIS — I6523 Occlusion and stenosis of bilateral carotid arteries: Secondary | ICD-10-CM

## 2023-09-13 ENCOUNTER — Encounter (INDEPENDENT_AMBULATORY_CARE_PROVIDER_SITE_OTHER)

## 2023-09-13 ENCOUNTER — Ambulatory Visit (INDEPENDENT_AMBULATORY_CARE_PROVIDER_SITE_OTHER): Admitting: Nurse Practitioner

## 2023-09-13 ENCOUNTER — Encounter (INDEPENDENT_AMBULATORY_CARE_PROVIDER_SITE_OTHER): Payer: Self-pay | Admitting: Vascular Surgery

## 2023-09-25 ENCOUNTER — Telehealth: Payer: Self-pay | Admitting: Family Medicine

## 2023-09-25 NOTE — Telephone Encounter (Signed)
 [facility] on gabapentin  and   Lansoprazole .

## 2023-09-26 ENCOUNTER — Other Ambulatory Visit: Payer: Self-pay

## 2023-09-26 ENCOUNTER — Other Ambulatory Visit: Payer: Self-pay | Admitting: Family Medicine

## 2023-09-26 DIAGNOSIS — G629 Polyneuropathy, unspecified: Secondary | ICD-10-CM

## 2023-09-26 MED ORDER — LANSOPRAZOLE 30 MG PO CPDR: 30.0000 mg | DELAYED_RELEASE_CAPSULE | Freq: Two times a day (BID) | ORAL | 5 refills | Status: DC

## 2023-09-26 MED ORDER — GABAPENTIN 100 MG PO CAPS: ORAL_CAPSULE | ORAL | 4 refills | Status: DC

## 2023-09-26 NOTE — Telephone Encounter (Signed)
Converted to refill request 

## 2023-09-27 ENCOUNTER — Telehealth: Payer: Self-pay

## 2023-09-27 NOTE — Telephone Encounter (Signed)
 Copied from CRM [id number]. Topic: Clinical - Prescription Issue >> Sep 26, 2023  2:05 PM Rosamond Comes wrote: Reason for CRM: patient calling asking about medication refill that has been Discontinued. gabapentin  (NEURONTIN ) 600 MG tablet [[id number]] DISCONTINUED  Gabapentin  100mg  is not helping and would like to have the 600mg  filled.   Patient would like to a nurse about why this was discontinued please call patient with this information  [phone]

## 2023-09-28 ENCOUNTER — Other Ambulatory Visit: Payer: Self-pay | Admitting: Family Medicine

## 2023-09-28 DIAGNOSIS — G629 Polyneuropathy, unspecified: Secondary | ICD-10-CM

## 2023-09-28 MED ORDER — GABAPENTIN 600 MG PO TABS: 600.0000 mg | ORAL_TABLET | Freq: Two times a day (BID) | ORAL | 1 refills | Status: DC

## 2023-09-28 NOTE — Telephone Encounter (Signed)
 Called patient No answer/voicemail not set up yet. If patient calls back ok for E2C2 to give patient provider's message and schedule appointment.

## 2023-09-28 NOTE — Telephone Encounter (Signed)
 It was not discontinued it is still on her active medication list. It was last prescribed with an additional 1 years refills Sep 06, 2022. [facility] However, she is due for a follow up appointment and labs needs to schedule before any additional refills can be approved.

## 2023-10-01 ENCOUNTER — Other Ambulatory Visit: Payer: Self-pay | Admitting: Family Medicine

## 2023-10-01 DIAGNOSIS — H9313 Tinnitus, bilateral: Secondary | ICD-10-CM

## 2023-10-01 NOTE — Telephone Encounter (Signed)
 NA/mailbox is full. Ok for E2C2 to give patient provider's message and schedule patient an appointment

## 2023-10-03 ENCOUNTER — Telehealth: Payer: Self-pay | Admitting: Family Medicine

## 2023-10-03 DIAGNOSIS — R051 Acute cough: Secondary | ICD-10-CM

## 2023-10-03 NOTE — Telephone Encounter (Signed)
 [facility] following medications:   WIXELA INHUB 250-50 MCG/ACT AEPB    Please advise

## 2023-10-03 NOTE — Telephone Encounter (Signed)
 Patient not answering. Closing encounter

## 2023-10-04 MED ORDER — FLUTICASONE-SALMETEROL 250-50 MCG/ACT IN AEPB: INHALATION_SPRAY | RESPIRATORY_TRACT | 11 refills | Status: AC

## 2023-10-04 NOTE — Addendum Note (Signed)
 Addended by: GASPER NANCYANN BRAVO on: 10/04/2023 07:51 AM   Modules accepted: Orders

## 2023-10-18 ENCOUNTER — Other Ambulatory Visit: Payer: Self-pay | Admitting: Family Medicine

## 2023-10-22 ENCOUNTER — Ambulatory Visit (INDEPENDENT_AMBULATORY_CARE_PROVIDER_SITE_OTHER)

## 2023-10-22 ENCOUNTER — Ambulatory Visit (INDEPENDENT_AMBULATORY_CARE_PROVIDER_SITE_OTHER): Admitting: Nurse Practitioner

## 2023-10-22 ENCOUNTER — Encounter (INDEPENDENT_AMBULATORY_CARE_PROVIDER_SITE_OTHER): Payer: Self-pay | Admitting: Nurse Practitioner

## 2023-10-22 VITALS — BP 100/70 | HR 79 | Ht 62.0 in | Wt 136.2 lb

## 2023-10-22 DIAGNOSIS — I6523 Occlusion and stenosis of bilateral carotid arteries: Secondary | ICD-10-CM

## 2023-10-22 DIAGNOSIS — K219 Gastro-esophageal reflux disease without esophagitis: Secondary | ICD-10-CM

## 2023-10-22 DIAGNOSIS — Z720 Tobacco use: Secondary | ICD-10-CM

## 2023-10-22 NOTE — Progress Notes (Signed)
 Subjective:    Patient ID: Alexandria Rodriguez, female    DOB: 03/24/1951, 73 y.o.   MRN: [id number] Chief Complaint  Patient presents with   Follow-up    1 year follow up Carotid    The patient is seen for follow up evaluation of carotid stenosis. The carotid stenosis followed by ultrasound.   The patient denies amaurosis fugax. There is no recent history of TIA symptoms or focal motor deficits. There is no prior documented CVA.  The patient is taking enteric-coated aspirin 81 mg daily.  There is no history of migraine headaches. There is no history of seizures.  She has had recent claudication symptoms concerning for possible peripheral arterial disease given her smoking..  No history of rest pain symptoms. No new ulcers or wounds of the lower extremities have occurred.  There is no history of DVT, PE or superficial thrombophlebitis. No documented recent episodes of angina or shortness of breath documented.   Carotid Duplex done today shows 1-39% bilaterally.  No change compared to last study in 2022      Review of Systems  Neurological:  Positive for tremors.  All other systems reviewed and are negative.      Objective:   Physical Exam Vitals reviewed.  HENT:     Head: Normocephalic.  Neck:     Vascular: No carotid bruit.  Cardiovascular:     Rate and Rhythm: Normal rate.     Pulses:          Dorsalis pedis pulses are 1+ on the right side and 1+ on the left side.       Posterior tibial pulses are 1+ on the right side and 1+ on the left side.  Pulmonary:     Effort: Pulmonary effort is normal.  Skin:    General: Skin is warm and dry.  Neurological:     Mental Status: She is alert and oriented to person, place, and time.     Motor: Weakness present.     Gait: Gait abnormal.  Psychiatric:        Mood and Affect: Mood normal.        Behavior: Behavior normal.        Thought Content: Thought content normal.        Judgment: Judgment normal.     BP 100/70 (BP  Location: Right Arm, Patient Position: Sitting, Cuff Size: Normal)   Pulse 79   Ht 5' 2 (1.575 m)   Wt 136 lb 3.2 oz (61.8 kg)   BMI 24.91 kg/m   Past Medical History:  Diagnosis Date   Anxiety    Cataract    Cerebrovascular disease    Chronic pain    back pain   Contact dermatitis    COPD (chronic obstructive pulmonary disease) (HCC)    Depression    Diverticulosis 10/31/2010   On Tranverse colon   GERD (gastroesophageal reflux disease)    Humerus fracture 04/10/2016   Hyperlipidemia    Myasthenia gravis (HCC) 02/2013   Diagnosed by doctor Maree   Neuropathy    Osteoporosis    Stroke [facility])    Vitamin D  deficiency     Social History   Socioeconomic History   Marital status: Married    Spouse name: Jackee   Number of children: 3   Years of education: Not on file   Highest education level: Some college, no degree  Occupational History   Occupation: disabled  Tobacco Use   Smoking status: Every Day  Current packs/day: 0.50    Average packs/day: 0.5 packs/day for 42.0 years (21.0 ttl pk-yrs)    Types: Cigarettes   Smokeless tobacco: Never   Tobacco comments:     Has been smoking for 30+ years, Cigarettes per day;10.  Vaping Use   Vaping status: Never Used  Substance and Sexual Activity   Alcohol use: No    Alcohol/week: 0.0 standard drinks of alcohol   Drug use: No   Sexual activity: Not on file  Other Topics Concern   Not on file  Social History Narrative   Ambulates with walker rollator or using assitance holding on to walls or solid surfaces.    Social Drivers of Corporate investment banker Strain: Low Risk  (03/21/2022)   Overall Financial Resource Strain (CARDIA)    Difficulty of Paying Living Expenses: Not hard at all  Food Insecurity: No Food Insecurity (06/12/2022)   Hunger Vital Sign    Worried About Running Out of Food in the Last Year: Never true    Ran Out of Food in the Last Year: Never true  Transportation Needs: No Transportation Needs  (06/12/2022)   PRAPARE - Administrator, Civil Service (Medical): No    Lack of Transportation (Non-Medical): No  Physical Activity: Inactive (03/21/2022)   Exercise Vital Sign    Days of Exercise per Week: 0 days    Minutes of Exercise per Session: 0 min  Stress: No Stress Concern Present (03/21/2022)   [employer] of Occupational Health - Occupational Stress Questionnaire    Feeling of Stress : Not at all  Social Connections: Socially Isolated (03/21/2022)   Social Connection and Isolation Panel    Frequency of Communication with Friends and Family: Once a week    Frequency of Social Gatherings with Friends and Family: Never    Attends Religious Services: Never    Database administrator or Organizations: No    Attends Banker Meetings: Never    Marital Status: Married  Catering manager Violence: Not At Risk (05/25/2022)   Humiliation, Afraid, Rape, and Kick questionnaire    Fear of Current or Ex-Partner: No    Emotionally Abused: No    Physically Abused: No    Sexually Abused: No    Past Surgical History:  Procedure Laterality Date   ABDOMINAL HYSTERECTOMY  2000   due to excessive bleeeding   CERVICAL CONE BIOPSY     Cervical discectomy and fusion  1999   [facility]   COLONOSCOPY WITH PROPOFOL  N/A 12/06/2015   Procedure: COLONOSCOPY WITH PROPOFOL ;  Surgeon: Lamar ONEIDA Holmes, MD;  Location: [facility] ENDOSCOPY;  Service: Endoscopy;  Laterality: N/A;   ESOPHAGOGASTRODUODENOSCOPY N/A 10/05/2014   Procedure: ESOPHAGOGASTRODUODENOSCOPY (EGD);  Surgeon: Lamar ONEIDA Holmes, MD;  Location: [facility] ENDOSCOPY;  Service: Endoscopy;  Laterality: N/A;   Repeat cuff surgery  2004   Developed adhesive capsulitis   ROTATOR CUFF REPAIR Right 2003   Shoulder    Family History  Problem Relation Age of Onset   Colon cancer Mother    Stroke Father    Cancer Sister        Ovarian, stable   Breast cancer Maternal Grandmother     Allergies  Allergen  Reactions   Effexor Xr [Venlafaxine Hcl Er] Other (See Comments)    Reaction:  GI upset    Fosamax [Alendronate Sodium] Nausea And Vomiting   Percocet [Oxycodone-Acetaminophen ] Swelling   Erythromycin Swelling and Rash    Pt states that  her tongue swells.     Lidoderm  [Lidocaine ] Swelling and Rash   Morphine  And Codeine Swelling, Rash and Other (See Comments)    Pt states that her tongue swells.   Pt states that her tongue swells.     Sulfa Antibiotics Swelling and Rash    TONGUE SWELLING   Sulfasalazine Rash and Swelling   Tramadol Swelling and Rash       Latest Ref Rng & Units 10/27/2022   10:00 AM 05/26/2022    2:19 AM 05/25/2022    7:47 PM  CBC  WBC 3.4 - 10.8 x10E3/uL 5.7  5.3  8.0   Hemoglobin 11.1 - 15.9 g/dL 88.5  89.2  87.9   Hematocrit 34.0 - 46.6 % 34.0  33.1  36.8   Platelets 150 - 450 x10E3/uL 201  136  153       CMP     Component Value Date/Time   NA 141 10/27/2022 1000   NA 138 05/13/2013 1951   K 4.6 10/27/2022 1000   K 4.5 05/13/2013 1951   CL 105 10/27/2022 1000   CL 106 05/13/2013 1951   CO2 22 10/27/2022 1000   CO2 28 05/13/2013 1951   GLUCOSE 96 10/27/2022 1000   GLUCOSE 101 (H) 05/26/2022 0219   GLUCOSE 92 05/13/2013 1951   BUN 12 10/27/2022 1000   BUN 6 (L) 05/13/2013 1951   CREATININE 1.09 (H) 10/27/2022 1000   CREATININE 0.79 05/13/2013 1951   CALCIUM  8.8 10/27/2022 1000   CALCIUM  8.9 05/13/2013 1951   PROT 6.4 10/27/2022 1000   PROT 7.1 05/13/2013 1951   ALBUMIN 4.2 10/27/2022 1000   ALBUMIN 3.8 05/13/2013 1951   AST 14 10/27/2022 1000   AST 28 05/13/2013 1951   ALT 9 10/27/2022 1000   ALT 22 05/13/2013 1951   ALKPHOS 87 10/27/2022 1000   ALKPHOS 91 05/13/2013 1951   BILITOT 0.2 10/27/2022 1000   BILITOT 0.5 05/13/2013 1951   GFRNONAA 53 (L) 05/26/2022 0219   GFRNONAA >60 05/13/2013 1951   GFRAA 88 05/11/2020 1334   GFRAA >60 05/13/2013 1951     VAS US  ABI WITH/WO TBI  Result Date: 08/03/2022  LOWER EXTREMITY DOPPLER STUDY  Patient Name:  Alexandria Rodriguez  Date of Exam:   08/02/2022 Medical Rec #: [id number]      Accession #:    [id number] Date of Birth: [birth year]       Patient Gender: F Patient Age:   45 years Exam Location:  [facility] Procedure:      VAS US  ABI WITH/WO TBI Referring Phys: Soyla Merck --------------------------------------------------------------------------------  Indications: Ulceration.  Performing Technologist: Leafy Gibes RVS  Examination Guidelines: A complete evaluation includes at minimum, Doppler waveform signals and systolic blood pressure reading at the level of bilateral brachial, anterior tibial, and posterior tibial arteries, when vessel segments are accessible. Bilateral testing is considered an integral part of a complete examination. Photoelectric Plethysmograph (PPG) waveforms and toe systolic pressure readings are included as required and additional duplex testing as needed. Limited examinations for reoccurring indications may be performed as noted.  ABI Findings: +---------+------------------+-----+---------+--------+ Right    Rt Pressure (mmHg)IndexWaveform Comment  +---------+------------------+-----+---------+--------+ Brachial 156                                      +---------+------------------+-----+---------+--------+ ATA      170  1.09 triphasic         +---------+------------------+-----+---------+--------+ PTA      180               1.15 triphasic         +---------+------------------+-----+---------+--------+ Great Toe146               0.94 Normal            +---------+------------------+-----+---------+--------+ +---------+------------------+-----+---------+-------+ Left     Lt Pressure (mmHg)IndexWaveform Comment +---------+------------------+-----+---------+-------+ Brachial 151                                     +---------+------------------+-----+---------+-------+ ATA      153               0.98  triphasic        +---------+------------------+-----+---------+-------+ PTA      173               1.11 triphasic        +---------+------------------+-----+---------+-------+ Great Toe145               0.93 Normal           +---------+------------------+-----+---------+-------+ +-------+-----------+-----------+------------+------------+ ABI/TBIToday's ABIToday's TBIPrevious ABIPrevious TBI +-------+-----------+-----------+------------+------------+ Right  1.15       .94                                 +-------+-----------+-----------+------------+------------+ Left   1.11       .93                                 +-------+-----------+-----------+------------+------------+  Summary: Right: Resting right ankle-brachial index is within normal range. The right toe-brachial index is normal. Left: Resting left ankle-brachial index is within normal range. The left toe-brachial index is normal. *See table(s) above for measurements and observations.  Electronically signed by Selinda Gu MD on 08/03/2022 at 9:49:23 AM.    Final        Assessment & Plan:   1. Bilateral carotid artery stenosis Recommend:  Given the patient's asymptomatic subcritical stenosis no further invasive testing or surgery at this time.  Duplex ultrasound shows 1-39% stenosis bilaterally.  Continue antiplatelet therapy as prescribed Continue management of CAD, HTN and Hyperlipidemia Healthy heart diet,  encouraged exercise at least 4 times per week Follow up in 12 months with duplex ultrasound and physical exam   2. Tobacco use Smoking cessation was discussed, 3-10 minutes spent on this topic specifically  3. Gastroesophageal reflux disease, unspecified whether esophagitis present Continue PPI as already ordered, this medication has been reviewed and there are no changes at this time.  Avoidence of caffeine and alcohol  Moderate elevation of the head of the bed    Current Outpatient Medications  on File Prior to Visit  Medication Sig Dispense Refill   carisoprodol  (SOMA ) 350 MG tablet TAKE 1 TABLET(350 MG) BY MOUTH TWICE DAILY 60 tablet 3   Cholecalciferol  (VITAMIN D3) 125 MCG (5000 UT) TABS Take 5,000 Units by mouth daily.     clopidogrel  (PLAVIX ) 75 MG tablet TAKE 1 TABLET(75 MG) BY MOUTH DAILY 90 tablet 4   DULoxetine  (CYMBALTA ) 20 MG capsule TAKE 2 CAPSULES BY MOUTH EVERY DAY 180 capsule 3   fluticasone -salmeterol (WIXELA INHUB) 250-50 MCG/ACT AEPB INHALE 1 PUFF INTO THE LUNGS  TWICE DAILY IN THE MORNING AND AT BEDTIME 60 each 11   gabapentin  (NEURONTIN ) 100 MG capsule TAKE 2 CAPSULES(200 MG) BY MOUTH THREE TIMES DAILY IN ADDITION TO 600 MG GABAPENTIN  TABLETS 180 capsule 4   gabapentin  (NEURONTIN ) 600 MG tablet Take 1 tablet (600 mg total) by mouth 2 (two) times daily. 60 tablet 1   ibuprofen  (ADVIL ) 400 MG tablet Take 1 tablet (400 mg total) by mouth every 6 (six) hours as needed for headache, moderate pain, cramping or mild pain. 30 tablet 0   lansoprazole  (PREVACID ) 30 MG capsule Take 1 capsule (30 mg total) by mouth 2 (two) times daily before a meal. 30 capsule 5   mupirocin  ointment (BACTROBAN ) 2 % APPLY TOPICALLY TO THE AFFECTED AREA TWICE DAILY 30 g 0   nortriptyline  (PAMELOR ) 50 MG capsule Take 1 capsule (50 mg total) by mouth at bedtime. 90 capsule 0   nystatin  (MYCOSTATIN /NYSTOP ) powder Apply 1 Application topically 3 (three) times daily. 60 g 0   rosuvastatin  (CRESTOR ) 20 MG tablet TAKE 1 TABLET(20 MG) BY MOUTH DAILY 90 tablet 3   triamcinolone  ointment (KENALOG ) 0.1 % APPLY EXTERNALLY TO THE AFFECTED AREA TWICE DAILY AS NEEDED 454 g 1   No current facility-administered medications on file prior to visit.    There are no Patient Instructions on file for this visit. No follow-ups on file.   Aerilynn Goin E Chaden Doom, NP

## 2023-10-29 ENCOUNTER — Other Ambulatory Visit: Payer: Self-pay | Admitting: Family Medicine

## 2023-10-29 DIAGNOSIS — H9313 Tinnitus, bilateral: Secondary | ICD-10-CM

## 2023-10-29 DIAGNOSIS — G629 Polyneuropathy, unspecified: Secondary | ICD-10-CM

## 2023-11-12 ENCOUNTER — Other Ambulatory Visit: Payer: Self-pay | Admitting: Family Medicine

## 2023-11-26 ENCOUNTER — Ambulatory Visit: Payer: Self-pay

## 2023-11-26 NOTE — Telephone Encounter (Signed)
 Needs to be seen to evaluate. Can schedule appointment here with a APP or goto urgent care.

## 2023-11-26 NOTE — Telephone Encounter (Signed)
 FYI Only or Action Required?: Action required by provider: clinical question for provider.: referred pt to ED & pt refused  Patient was last seen in primary care on 04/25/2023 by Gasper Nancyann BRAVO, MD.  Called Nurse Triage reporting Pain.  Symptoms began a week ago.  Interventions attempted: OTC medications: tylenol .  Symptoms are: gradually worsening.  Triage Disposition: Go to ED Now (Notify PCP)  Patient/caregiver understands and will follow disposition?: Yes   Copied from CRM #[id number]. Topic: Clinical - Red Word Triage >> Nov 26, 2023 10:45 AM Montie POUR wrote: Red Word that prompted transfer to Nurse Triage:  She fell 3 weeks ago; She is still having pain in her right side and breast area. She has COPD and her cough is getting worse. She wants to know if this is because she fell. Pain level is a 8 -9. Reason for Disposition  SEVERE chest pain  Answer Assessment - Initial Assessment Questions 1. LOCATION: Where does it hurt?       Chest / breast area on right 2. RADIATION: Does the pain go anywhere else? (e.g., into neck, jaw, arms, back)    When coughing pain goes across chest 3. ONSET: When did the chest pain begin? (Minutes, hours or days)      X3 weeks and worsening 4. PATTERN: Does the pain come and go, or has it been constant since it started?  Does it get worse with exertion?      constant 5. DURATION: How long does it last (e.g., seconds, minutes, hours)     na 6. SEVERITY: How bad is the pain?  (e.g., Scale 1-10; mild, moderate, or severe) 9/10 7. CARDIAC RISK FACTORS: Do you have any history of heart problems or risk factors for heart disease? (e.g., angina, prior heart attack; diabetes, high blood pressure, high cholesterol, smoker, or strong family history of heart disease)     na 8. PULMONARY RISK FACTORS: Do you have any history of lung disease?  (e.g., blood clots in lung, asthma, emphysema, birth control pills)     na 9. CAUSE: What do you  think is causing the chest pain?     From fall 10. OTHER SYMPTOMS: Do you have any other symptoms? (e.g., dizziness, nausea, vomiting, sweating, fever, difficulty breathing, cough)       No 11. PREGNANCY: Is there any chance you are pregnant? When was your last menstrual period?       na   Chest pain gets worse when breathing,.  Recommended patient go to efor evaluation and treatment: pt refused.  Pt would like PCP's advised regarding this matterr.  Protocols used: Chest Pain-A-AH

## 2023-11-27 ENCOUNTER — Ambulatory Visit: Payer: Self-pay | Admitting: Family Medicine

## 2023-11-27 ENCOUNTER — Ambulatory Visit
Admission: RE | Admit: 2023-11-27 | Discharge: 2023-11-27 | Disposition: A | Discharge status: Home / Self Care | Attending: Family Medicine | Admitting: Family Medicine

## 2023-11-27 ENCOUNTER — Ambulatory Visit (INDEPENDENT_AMBULATORY_CARE_PROVIDER_SITE_OTHER): Admitting: Family Medicine

## 2023-11-27 ENCOUNTER — Encounter: Payer: Self-pay | Admitting: Family Medicine

## 2023-11-27 ENCOUNTER — Telehealth: Payer: Self-pay | Admitting: Family Medicine

## 2023-11-27 ENCOUNTER — Ambulatory Visit
Admission: RE | Admit: 2023-11-27 | Discharge: 2023-11-27 | Disposition: A | Discharge status: Home / Self Care | Source: Ambulatory Visit | Attending: Family Medicine | Admitting: Family Medicine

## 2023-11-27 VITALS — BP 84/63 | HR 84 | Temp 98.4°F | Ht 62.0 in | Wt 138.7 lb

## 2023-11-27 DIAGNOSIS — R251 Tremor, unspecified: Secondary | ICD-10-CM | POA: Diagnosis not present

## 2023-11-27 DIAGNOSIS — J449 Chronic obstructive pulmonary disease, unspecified: Secondary | ICD-10-CM | POA: Diagnosis not present

## 2023-11-27 DIAGNOSIS — R109 Unspecified abdominal pain: Secondary | ICD-10-CM

## 2023-11-27 DIAGNOSIS — W19XXXA Unspecified fall, initial encounter: Secondary | ICD-10-CM | POA: Diagnosis not present

## 2023-11-27 DIAGNOSIS — M79621 Pain in right upper arm: Secondary | ICD-10-CM | POA: Diagnosis not present

## 2023-11-27 DIAGNOSIS — M79601 Pain in right arm: Secondary | ICD-10-CM | POA: Insufficient documentation

## 2023-11-27 DIAGNOSIS — R296 Repeated falls: Secondary | ICD-10-CM

## 2023-11-27 DIAGNOSIS — S32010A Wedge compression fracture of first lumbar vertebra, initial encounter for closed fracture: Secondary | ICD-10-CM | POA: Diagnosis not present

## 2023-11-27 DIAGNOSIS — M25511 Pain in right shoulder: Secondary | ICD-10-CM | POA: Diagnosis not present

## 2023-11-27 DIAGNOSIS — M810 Age-related osteoporosis without current pathological fracture: Secondary | ICD-10-CM | POA: Insufficient documentation

## 2023-11-27 DIAGNOSIS — R0789 Other chest pain: Secondary | ICD-10-CM | POA: Diagnosis not present

## 2023-11-27 MED ORDER — MELOXICAM 7.5 MG PO TABS
7.5000 mg | ORAL_TABLET | Freq: Every day | ORAL | 0 refills | Status: DC
Start: 2023-11-27 — End: 2024-01-11

## 2023-11-27 NOTE — Patient Instructions (Addendum)
 Please report to [facility] located at:  [address]  [place], [place] [address]  You do not need an appointment to have xrays completed.   Our office will follow up with  results once available.    VISIT SUMMARY: Today, you were seen for right-sided pain following a fall that occurred three weeks ago. The pain extends from your right armpit down to your right flank and into your right arm, with significant discomfort in your right breast and arm. You also reported worsening symptoms of COPD, which exacerbates your chest pain. We discussed your chronic conditions, including osteoporosis and peripheral neuropathy, which contribute to your balance issues and falls.  YOUR PLAN: -RIGHT-SIDED PAIN AFTER FALL: You have pain on your right side extending from your armpit to your flank and arm, likely due to a fall three weeks ago. This pain could be due to a rib fracture, especially since osteoporosis increases your risk of fractures. We will order a chest x-ray and x-rays of your right shoulder, humerus, and forearm to check for fractures. You should start taking meloxicam  7.5 mg once daily, and you can increase it to 15 mg if needed. Please stop taking ibuprofen  and Advil . Use a heating pad and alternate with ice for pain relief, and engage in gentle activities to allow healing if fractures are present.  -CHRONIC OBSTRUCTIVE PULMONARY DISEASE (COPD): COPD is a chronic lung condition that makes it hard to breathe. Your symptoms have been worsening, and coughing is making your chest pain worse. We will continue to monitor your condition.  -OSTEOPOROSIS: Osteoporosis is a condition where your bones become weak and are more likely to break. This increases your risk of fractures, especially after falls. We will continue to manage this condition to help prevent future fractures.  -PERIPHERAL NEUROPATHY: Peripheral neuropathy is a condition that affects your nerves  and can cause balance issues, contributing to your falls. We will continue to manage this condition to help improve your balance and reduce the risk of falls.  INSTRUCTIONS: Please get the chest x-ray and x-rays of your right shoulder, humerus, and forearm as soon as possible. Follow the medication instructions for meloxicam  and discontinue ibuprofen  and Advil . Use a heating pad and alternate with ice for pain relief. Engage in gentle activities to allow healing if fractures are present. Follow up with us  after your x-rays are done to discuss the results and next steps.                      Contains text generated by Abridge.                                 Contains text generated by Abridge.

## 2023-11-27 NOTE — Progress Notes (Signed)
 ACUTE VISIT   Patient: Alexandria Rodriguez   DOB: 06-13-1950   73 y.o. Female  MRN: [id number]   PCP: Gasper Nancyann FORBES, MD  Chief Complaint  Patient presents with   Acute Visit    Patient reports she is here today due to a fall that happened about 3 weeks ago.  She is complaining of right side pain under her arm, boob, and down her side.  Stated she is having chest pains but thinks it is from her coughing due to her COPD worsening.   Subjective    HPI HPI     Acute Visit    Additional comments: Patient reports she is here today due to a fall that happened about 3 weeks ago.  She is complaining of right side pain under her arm, boob, and down her side.  Stated she is having chest pains but thinks it is from her coughing due to her COPD worsening.      Last edited by Terrel Powell CROME, CMA on 11/27/2023 10:59 AM.       Discussed the use of AI scribe software for clinical note transcription with the patient, who gave verbal consent to proceed.  History of Present Illness Alexandria Rodriguez is a 73 year old female with osteoporosis who presents with right-sided pain following a fall.  She has been experiencing right-sided pain since a fall that occurred three weeks ago. The pain extends from her right axilla down to her right flank and into her right arm, with significant discomfort in her right breast and arm. The pain radiates to the area where she applies deodorant. Initially severe, the pain has persisted. She uses extra strength Tylenol , dual action Advil , and gabapentin  600 mg twice daily for pain management, along with a heating pad for temporary relief.  During the fall, she landed flat on the floor and possibly injured her arm, as evidenced by a raw spot. She did not hear any popping or cracking sounds and did not sustain a head injury. Falling is not unusual for her, and she attempts to brace herself during such incidents. She has chronic neck pain and neuropathy, which affects her  balance and contributes to her falls. She uses a cane for mobility and requires a wheelchair for longer distances.  She experiences chest pain, which she attributes to coughing from her worsening COPD. The coughing exacerbates the soreness in her chest. No recent imaging has been done since August. She has allergies to tramadol, Percocet, and morphine  but has tolerated meloxicam  7.5 mg in the past without issues.     Medications: Outpatient Medications Prior to Visit  Medication Sig   carisoprodol  (SOMA ) 350 MG tablet TAKE 1 TABLET BY MOUTH TWICE A DAY   Cholecalciferol  (VITAMIN D3) 125 MCG (5000 UT) TABS Take 5,000 Units by mouth daily.   clopidogrel  (PLAVIX ) 75 MG tablet TAKE 1 TABLET(75 MG) BY MOUTH DAILY   DULoxetine  (CYMBALTA ) 20 MG capsule TAKE 2 CAPSULES BY MOUTH EVERY DAY   fluticasone -salmeterol (WIXELA INHUB) 250-50 MCG/ACT AEPB INHALE 1 PUFF INTO THE LUNGS TWICE DAILY IN THE MORNING AND AT BEDTIME   gabapentin  (NEURONTIN ) 100 MG capsule TAKE 2 CAPSULES(200 MG) BY MOUTH THREE TIMES DAILY IN ADDITION TO 600 MG GABAPENTIN  TABLETS   gabapentin  (NEURONTIN ) 600 MG tablet TAKE 1 TABLET BY MOUTH TWICE A DAY   ibuprofen  (ADVIL ) 400 MG tablet Take 1 tablet (400 mg total) by mouth every 6 (six) hours as needed for headache,  moderate pain, cramping or mild pain.   lansoprazole  (PREVACID ) 30 MG capsule Take 1 capsule (30 mg total) by mouth 2 (two) times daily before a meal.   mupirocin  ointment (BACTROBAN ) 2 % APPLY TOPICALLY TO THE AFFECTED AREA TWICE DAILY   nortriptyline  (PAMELOR ) 50 MG capsule TAKE 1 CAPSULE BY MOUTH AT BEDTIME.   rosuvastatin  (CRESTOR ) 20 MG tablet TAKE 1 TABLET(20 MG) BY MOUTH DAILY   triamcinolone  ointment (KENALOG ) 0.1 % APPLY EXTERNALLY TO THE AFFECTED AREA TWICE DAILY AS NEEDED   nystatin  (MYCOSTATIN /NYSTOP ) powder Apply 1 Application topically 3 (three) times daily.   No facility-administered medications prior to visit.    Last metabolic panel Lab Results   Component Value Date   GLUCOSE 96 10/27/2022   NA 141 10/27/2022   K 4.6 10/27/2022   CL 105 10/27/2022   CO2 22 10/27/2022   BUN 12 10/27/2022   CREATININE 1.09 (H) 10/27/2022   EGFR 54 (L) 10/27/2022   CALCIUM  8.8 10/27/2022   PHOS 3.1 05/04/2022   PROT 6.4 10/27/2022   ALBUMIN 4.2 10/27/2022   LABGLOB 2.2 10/27/2022   AGRATIO 2.1 03/29/2021   BILITOT 0.2 10/27/2022   ALKPHOS 87 10/27/2022   AST 14 10/27/2022   ALT 9 10/27/2022   ANIONGAP 5 05/26/2022   Last lipids Lab Results  Component Value Date   CHOL 120 10/27/2022   HDL 62 10/27/2022   LDLCALC 45 10/27/2022   TRIG 62 10/27/2022   CHOLHDL 1.9 10/27/2022   Last hemoglobin A1c No results found for: HGBA1C      Objective    BP (!) 84/63 (BP Location: Right Arm, Patient Position: Sitting, Cuff Size: Normal)   Pulse 84   Temp 98.4 F (36.9 C) (Oral)   Ht 5' 2 (1.575 m)   Wt 138 lb 11.2 oz (62.9 kg)   SpO2 100%   BMI 25.37 kg/m   BP Readings from Last 3 Encounters:  11/27/23 (!) 84/63  10/22/23 100/70  03/05/23 108/74   Wt Readings from Last 3 Encounters:  11/27/23 138 lb 11.2 oz (62.9 kg)  10/22/23 136 lb 3.2 oz (61.8 kg)  03/05/23 129 lb (58.5 kg)      Physical Exam   Physical Exam CHEST: Tenderness along right flank extending to axilla. EXTREMITIES: Tenderness in right humerus and forearm. Limited ROM 2/2 to pain    No results found for any visits on 11/27/23.   Assessment & Plan      Assessment & Plan Right-sided pain after fall subacute Right-sided pain extending from the right axilla down to the right flank following a fall three weeks ago. Pain is also present in the right breast, arm, and shoulder. Tenderness along the right flank, axilla, humerus, and forearm. Differential diagnosis includes rib fracture. Osteoporosis increases fracture risk. - Order chest x-ray to evaluate for rib fractures - Order x-rays of the right shoulder, humerus, and forearm - Prescribe meloxicam  7.5  mg once daily, with the option to increase to 15 mg if needed - Advise discontinuation of ibuprofen  and Advil  - Recommend use of heating pad and alternating with ice for pain management - Advise gentle activity to allow healing if fractures are present  Chronic obstructive pulmonary disease (COPD) COPD with reported worsening symptoms. Coughing exacerbates right-sided pain.  Osteoporosis Chronic Osteoporosis increases the risk of fractures following falls.  Peripheral neuropathy Chronic  - continue gabapentin  600 bid and 200mg  TID  Peripheral neuropathy contributing to balance issues and falls.      Return  in about 1 month (around 12/28/2023), or if symptoms worsen or fail to improve.        Rockie Agent, MD  [facility] [phone] (phone) [phone] (fax)  [facility]

## 2023-11-27 NOTE — Telephone Encounter (Signed)
 Pt returning your call

## 2023-11-27 NOTE — Telephone Encounter (Signed)
 Appointment made for today

## 2023-11-28 ENCOUNTER — Other Ambulatory Visit: Payer: Self-pay | Admitting: Family Medicine

## 2023-11-28 DIAGNOSIS — M79621 Pain in right upper arm: Secondary | ICD-10-CM

## 2023-11-28 DIAGNOSIS — W19XXXA Unspecified fall, initial encounter: Secondary | ICD-10-CM

## 2023-11-28 DIAGNOSIS — M79601 Pain in right arm: Secondary | ICD-10-CM

## 2023-12-11 ENCOUNTER — Other Ambulatory Visit: Payer: Self-pay | Admitting: Family Medicine

## 2023-12-23 ENCOUNTER — Other Ambulatory Visit: Payer: Self-pay | Admitting: Family Medicine

## 2023-12-23 DIAGNOSIS — M79621 Pain in right upper arm: Secondary | ICD-10-CM

## 2023-12-23 DIAGNOSIS — M79601 Pain in right arm: Secondary | ICD-10-CM

## 2023-12-23 DIAGNOSIS — W19XXXA Unspecified fall, initial encounter: Secondary | ICD-10-CM

## 2023-12-24 NOTE — Telephone Encounter (Signed)
 Was this intended to be long-term or acute use only? Patient saw you 11/24/23 for LBP d/t fall.   Please advise, thank you!

## 2023-12-31 ENCOUNTER — Ambulatory Visit (INDEPENDENT_AMBULATORY_CARE_PROVIDER_SITE_OTHER): Admitting: Family Medicine

## 2023-12-31 ENCOUNTER — Encounter: Payer: Self-pay | Admitting: Family Medicine

## 2023-12-31 ENCOUNTER — Other Ambulatory Visit: Payer: Self-pay

## 2023-12-31 VITALS — BP 120/76 | HR 77 | Resp 16 | Ht 63.0 in

## 2023-12-31 DIAGNOSIS — Z87891 Personal history of nicotine dependence: Secondary | ICD-10-CM

## 2023-12-31 DIAGNOSIS — E559 Vitamin D deficiency, unspecified: Secondary | ICD-10-CM

## 2023-12-31 DIAGNOSIS — F17210 Nicotine dependence, cigarettes, uncomplicated: Secondary | ICD-10-CM

## 2023-12-31 DIAGNOSIS — I6523 Occlusion and stenosis of bilateral carotid arteries: Secondary | ICD-10-CM

## 2023-12-31 DIAGNOSIS — E538 Deficiency of other specified B group vitamins: Secondary | ICD-10-CM

## 2023-12-31 DIAGNOSIS — R296 Repeated falls: Secondary | ICD-10-CM

## 2023-12-31 DIAGNOSIS — Z23 Encounter for immunization: Secondary | ICD-10-CM | POA: Diagnosis not present

## 2023-12-31 DIAGNOSIS — N1832 Chronic kidney disease, stage 3b: Secondary | ICD-10-CM

## 2023-12-31 DIAGNOSIS — Z122 Encounter for screening for malignant neoplasm of respiratory organs: Secondary | ICD-10-CM

## 2023-12-31 DIAGNOSIS — M810 Age-related osteoporosis without current pathological fracture: Secondary | ICD-10-CM

## 2023-12-31 DIAGNOSIS — J449 Chronic obstructive pulmonary disease, unspecified: Secondary | ICD-10-CM | POA: Diagnosis not present

## 2023-12-31 NOTE — Patient Instructions (Signed)
 SABRA  Please review the attached list of medications and notify my office if there are any errors.   . Please bring all of your medications to every appointment so we can make sure that our medication list is the same as yours.

## 2023-12-31 NOTE — Progress Notes (Signed)
 Established patient visit   Patient: Alexandria Rodriguez   DOB: Jun 18, 1950   73 y.o. Female  MRN: [id number] Visit Date: 12/31/2023  Today's healthcare provider: Nancyann Perry, MD   Chief Complaint  Patient presents with   Follow-up    1 month f/u   Subjective    Discussed the use of AI scribe software for clinical note transcription with the patient, who gave verbal consent to proceed.  History of Present Illness   Alexandria Rodriguez is a 73 year old female with osteoporosis and COPD who presents for a checkup and follow up fall one month ago.  She experiences breast pain when lying in certain positions following a fall last month. The pain is almost resolved, and x-rays showed no fractures. She describes frequent falls, occurring in spurts of two or three times, followed by several months without incidents. She attributes these falls to not paying attention to her feet and tripping over objects like the couch leg. She uses a regular walker and has a four-wheeled walker, which she avoids using due to safety concerns expressed by a physical therapist.  She mentions difficulty navigating doorways, often catching her elbow and tearing her skin. She is practicing being more mindful of her steps to avoid these incidents.  She has osteroporosis and vitamin D  deficiency but is not currently taking vitamin D  supplements due to stomach discomfort, despite previously low vitamin D  levels. She acknowledges having osteoporosis.  She has not received a B12 shot in four to five months and is due for blood work to check vitamin levels, including B12.  She reports persistent coughing, which she attributes to smoking, and uses Wixela twice daily, sometimes more frequently when her cough worsens. She reports having COPD and continues to smoke.     Lab Results  Component Value Date   VITAMINB12 1,712 (H) 10/27/2022   Lab Results  Component Value Date   VD25OH 10.9 (L) 03/05/2023   Lab Results   Component Value Date   CHOL 120 10/27/2022   HDL 62 10/27/2022   LDLCALC 45 10/27/2022   TRIG 62 10/27/2022   CHOLHDL 1.9 10/27/2022   Lab Results  Component Value Date   NA 141 10/27/2022   CL 105 10/27/2022   K 4.6 10/27/2022   CO2 22 10/27/2022   BUN 12 10/27/2022   CREATININE 1.09 (H) 10/27/2022   EGFR 54 (L) 10/27/2022   CALCIUM  8.8 10/27/2022   PHOS 3.1 05/04/2022   ALBUMIN 4.2 10/27/2022   GLUCOSE 96 10/27/2022     Medications: Outpatient Medications Prior to Visit  Medication Sig   carisoprodol  (SOMA ) 350 MG tablet TAKE 1 TABLET BY MOUTH TWICE A DAY   clopidogrel  (PLAVIX ) 75 MG tablet TAKE 1 TABLET(75 MG) BY MOUTH DAILY   DULoxetine  (CYMBALTA ) 20 MG capsule TAKE 2 CAPSULES BY MOUTH EVERY DAY   fluticasone -salmeterol (WIXELA INHUB) 250-50 MCG/ACT AEPB INHALE 1 PUFF INTO THE LUNGS TWICE DAILY IN THE MORNING AND AT BEDTIME   gabapentin  (NEURONTIN ) 100 MG capsule TAKE 2 CAPSULES(200 MG) BY MOUTH THREE TIMES DAILY IN ADDITION TO 600 MG GABAPENTIN  TABLETS   gabapentin  (NEURONTIN ) 600 MG tablet TAKE 1 TABLET BY MOUTH TWICE A DAY   ibuprofen  (ADVIL ) 400 MG tablet Take 1 tablet (400 mg total) by mouth every 6 (six) hours as needed for headache, moderate pain, cramping or mild pain.   lansoprazole  (PREVACID ) 30 MG capsule Take 1 capsule (30 mg total) by mouth 2 (two) times daily  before a meal.   meloxicam  (MOBIC ) 7.5 MG tablet Take 1 tablet (7.5 mg total) by mouth daily.   mupirocin  ointment (BACTROBAN ) 2 % APPLY TOPICALLY TO THE AFFECTED AREA TWICE DAILY   nortriptyline  (PAMELOR ) 50 MG capsule TAKE 1 CAPSULE BY MOUTH AT BEDTIME.   nystatin  (MYCOSTATIN /NYSTOP ) powder Apply 1 Application topically 3 (three) times daily.   rosuvastatin  (CRESTOR ) 20 MG tablet TAKE 1 TABLET(20 MG) BY MOUTH DAILY   triamcinolone  ointment (KENALOG ) 0.1 % APPLY EXTERNALLY TO THE AFFECTED AREA TWICE DAILY AS NEEDED   Cholecalciferol  (VITAMIN D3) 125 MCG (5000 UT) TABS Take 5,000 Units by mouth daily.  (Patient not taking: Reported on 12/31/2023)   No facility-administered medications prior to visit.   Review of Systems  Constitutional:  Negative for appetite change, chills, fatigue and fever.  Respiratory:  Negative for chest tightness and shortness of breath.   Cardiovascular:  Negative for chest pain and palpitations.  Gastrointestinal:  Negative for abdominal pain, nausea and vomiting.  Neurological:  Negative for dizziness and weakness.       Objective    BP 120/76 (BP Location: Left Arm, Patient Position: Sitting, Cuff Size: Normal)   Pulse 77   Resp 16   Ht 5' 3 (1.6 m)   SpO2 100%   BMI 24.57 kg/m   Physical Exam   General: Appearance:    Well developed, well nourished female in no acute distress  Eyes:    PERRL, conjunctiva/corneas clear, EOM's intact       Lungs:     Occasional expiratory wheezes, respirations unlabored  Heart:    Normal heart rate. Normal rhythm. No murmurs, rubs, or gallops.    MS:   All extremities are intact.    Neurologic:   Awake, alert, oriented x 3. No apparent focal neurological defect.        Assessment & Plan     1. Falls frequently (Primary) Has been working with PT. Strongly encouraged use of 4-legged walker. Encouraged to start back on vitamin d  supplement.   2. Osteoporosis, unspecified osteoporosis type, unspecified pathological fracture presence  - TSH - VITAMIN D  25 Hydroxy (Vit-D Deficiency, Fractures)  Has not been taking vitamin D  supplements as previously advised.   3. Chronic obstructive pulmonary disease, unspecified COPD type (HCC) Encouraged complete smoking cessation. Continue use of Wixela inhaler.   4. B12 deficiency Has been off of b12 shots for several months.  - Vitamin B12  5. Vitamin D  deficiency  - VITAMIN D  25 Hydroxy (Vit-D Deficiency, Fractures)  6. Chronic kidney disease, stage 3b (HCC)  - CBC - Comprehensive metabolic panel with GFR  7. Bilateral carotid artery stenosis Asymptomatic.  Compliant with medication.  Continue aggressive risk factor modification.   - Lipid panel  8. Need for influenza vaccination Given high dose flu vaccine today.   9. Smokes with greater than 30 pack year history  - Ambulatory Referral for Lung Cancer Scre       Nancyann Perry, MD  [facility] [phone] (phone) [phone] (fax)  [facility]

## 2024-01-01 ENCOUNTER — Ambulatory Visit: Payer: Self-pay | Admitting: Family Medicine

## 2024-01-01 DIAGNOSIS — E538 Deficiency of other specified B group vitamins: Secondary | ICD-10-CM

## 2024-01-01 LAB — COMPREHENSIVE METABOLIC PANEL WITH GFR
ALT: 8 IU/L (ref 0–32)
AST: 14 IU/L (ref 0–40)
Albumin: 4 g/dL (ref 3.8–4.8)
Alkaline Phosphatase: 106 IU/L (ref 49–135)
BUN/Creatinine Ratio: 13 (ref 12–28)
BUN: 15 mg/dL (ref 8–27)
Bilirubin Total: 0.3 mg/dL (ref 0.0–1.2)
CO2: 20 mmol/L (ref 20–29)
Calcium: 8.7 mg/dL (ref 8.7–10.3)
Chloride: 105 mmol/L (ref 96–106)
Creatinine, Ser: 1.17 mg/dL — ABNORMAL HIGH (ref 0.57–1.00)
Globulin, Total: 1.9 g/dL (ref 1.5–4.5)
Glucose: 86 mg/dL (ref 70–99)
Potassium: 4.3 mmol/L (ref 3.5–5.2)
Sodium: 140 mmol/L (ref 134–144)
Total Protein: 5.9 g/dL — ABNORMAL LOW (ref 6.0–8.5)
eGFR: 49 mL/min/1.73 — ABNORMAL LOW (ref >59)

## 2024-01-01 LAB — LIPID PANEL
Chol/HDL Ratio: 1.9 ratio (ref 0.0–4.4)
Cholesterol, Total: 115 mg/dL (ref 100–199)
HDL: 60 mg/dL (ref >39)
LDL Chol Calc (NIH): 41 mg/dL (ref 0–99)
Triglycerides: 66 mg/dL (ref 0–149)
VLDL Cholesterol Cal: 14 mg/dL (ref 5–40)

## 2024-01-01 LAB — CBC
Hematocrit: 32.6 % — ABNORMAL LOW (ref 34.0–46.6)
Hemoglobin: 10.8 g/dL — ABNORMAL LOW (ref 11.1–15.9)
MCH: 33 pg (ref 26.6–33.0)
MCHC: 33.1 g/dL (ref 31.5–35.7)
MCV: 100 fL — ABNORMAL HIGH (ref 79–97)
Platelets: 199 x10E3/uL (ref 150–450)
RBC: 3.27 x10E6/uL — ABNORMAL LOW (ref 3.77–5.28)
RDW: 13.2 % (ref 11.7–15.4)
WBC: 5.4 x10E3/uL (ref 3.4–10.8)

## 2024-01-01 LAB — TSH: TSH: 1.78 u[IU]/mL (ref 0.450–4.500)

## 2024-01-01 LAB — VITAMIN D 25 HYDROXY (VIT D DEFICIENCY, FRACTURES): Vit D, 25-Hydroxy: 5.7 ng/mL — ABNORMAL LOW (ref 30.0–100.0)

## 2024-01-01 LAB — VITAMIN B12: Vitamin B-12: 205 pg/mL — ABNORMAL LOW (ref 232–1245)

## 2024-01-02 MED ORDER — CYANOCOBALAMIN 1000 MCG/ML IJ SOLN: 1000.0000 ug | Freq: Once | INTRAMUSCULAR | Status: AC

## 2024-01-04 NOTE — Addendum Note (Signed)
 Addended by: MARYLEN ODELLA CROME on: 01/04/2024 04:00 PM   Modules accepted: Orders

## 2024-01-11 ENCOUNTER — Other Ambulatory Visit: Payer: Self-pay | Admitting: Family Medicine

## 2024-01-11 DIAGNOSIS — W19XXXA Unspecified fall, initial encounter: Secondary | ICD-10-CM

## 2024-01-11 DIAGNOSIS — M79621 Pain in right upper arm: Secondary | ICD-10-CM

## 2024-01-11 DIAGNOSIS — M79601 Pain in right arm: Secondary | ICD-10-CM

## 2024-01-15 ENCOUNTER — Other Ambulatory Visit: Payer: Self-pay | Admitting: Family Medicine

## 2024-01-15 NOTE — Telephone Encounter (Signed)
 Copied from CRM [id number]. Topic: Clinical - Medication Refill >> Jan 15, 2024  8:19 AM Alexandria Rodriguez wrote: Medication: carisoprodol  (SOMA ) 350 MG tablet  Has the patient contacted their pharmacy? Yes (Agent: If no, request that the patient contact the pharmacy for the refill. If patient does not wish to contact the pharmacy document the reason why and proceed with request.) (Agent: If yes, when and what did the pharmacy advise?)  This is the patient's preferred pharmacy:  [id number] - [place], [place] - [address] [place] [address] Phone: [phone] Fax: [phone]   Is this the correct pharmacy for this prescription? Yes If no, delete pharmacy and type the correct one.   Has the prescription been filled recently? No  Is the patient out of the medication? Yes  Has the patient been seen for an appointment in the last year OR does the patient have an upcoming appointment? Yes  Can we respond through MyChart? No  Agent: Please be advised that Rx refills may take up to 3 business days. We ask that you follow-up with your pharmacy.

## 2024-01-16 NOTE — Telephone Encounter (Signed)
 Requested medication (s) are due for refill today - no  Requested medication (s) are on the active medication list -yes  Future visit scheduled -yes  Last refill: 01/15/24  Notes to clinic: Duplicate request, non delegated Rx  Requested Prescriptions  Pending Prescriptions Disp Refills   carisoprodol  (SOMA ) 350 MG tablet 60 tablet 2    Sig: Take 1 tablet (350 mg total) by mouth 2 (two) times daily.     Not Delegated - Analgesics:  Muscle Relaxants Failed - 01/16/2024  3:55 PM      Failed - This refill cannot be delegated      Passed - Valid encounter within last 6 months    Recent Outpatient Visits           2 weeks ago Falls frequently   [facility] Gasper Nancyann BRAVO, MD   1 month ago Fall, initial encounter   [facility] Simmons-Robinson, Rockie, MD                 Requested Prescriptions  Pending Prescriptions Disp Refills   carisoprodol  (SOMA ) 350 MG tablet 60 tablet 2    Sig: Take 1 tablet (350 mg total) by mouth 2 (two) times daily.     Not Delegated - Analgesics:  Muscle Relaxants Failed - 01/16/2024  3:55 PM      Failed - This refill cannot be delegated      Passed - Valid encounter within last 6 months    Recent Outpatient Visits           2 weeks ago Falls frequently   [facility] Gasper Nancyann BRAVO, MD   1 month ago Fall, initial encounter   [facility] Sharma Rockie, MD

## 2024-01-20 MED ORDER — CARISOPRODOL 350 MG PO TABS: 350.0000 mg | ORAL_TABLET | Freq: Two times a day (BID) | ORAL | 2 refills | Status: AC

## 2024-01-30 ENCOUNTER — Ambulatory Visit: Admitting: Family Medicine

## 2024-01-30 ENCOUNTER — Ambulatory Visit

## 2024-02-22 NOTE — Progress Notes (Signed)
 Pharmacy Quality Measure Review  This patient is appearing on a report for being at risk of failing the adherence measure for cholesterol (statin) medications this calendar year.   Medication: rosuvastatin  Last fill date: 02/08/24 for 90 day supply  Insurance report was not up to date. No action needed at this time.   Kevaughn Ewing E. Marsh, PharmD, CPP Clinical Pharmacist [facility] [phone]

## 2024-02-27 ENCOUNTER — Ambulatory Visit: Admitting: Podiatry

## 2024-02-27 ENCOUNTER — Ambulatory Visit

## 2024-02-27 ENCOUNTER — Ambulatory Visit: Payer: Self-pay

## 2024-02-27 DIAGNOSIS — S9031XA Contusion of right foot, initial encounter: Secondary | ICD-10-CM

## 2024-02-27 DIAGNOSIS — S92334A Nondisplaced fracture of third metatarsal bone, right foot, initial encounter for closed fracture: Secondary | ICD-10-CM | POA: Diagnosis not present

## 2024-02-27 DIAGNOSIS — S92344A Nondisplaced fracture of fourth metatarsal bone, right foot, initial encounter for closed fracture: Secondary | ICD-10-CM

## 2024-02-27 DIAGNOSIS — S92354A Nondisplaced fracture of fifth metatarsal bone, right foot, initial encounter for closed fracture: Secondary | ICD-10-CM

## 2024-02-27 DIAGNOSIS — S92324A Nondisplaced fracture of second metatarsal bone, right foot, initial encounter for closed fracture: Secondary | ICD-10-CM | POA: Diagnosis not present

## 2024-02-27 MED ORDER — VITAMIN D (ERGOCALCIFEROL) 1.25 MG (50000 UNIT) PO CAPS: 50000.0000 [IU] | ORAL_CAPSULE | ORAL | 0 refills | Status: AC

## 2024-02-27 NOTE — Telephone Encounter (Signed)
 FYI Only or Action Required?: Action required by provider: update on patient condition.  Patient was last seen in primary care on 12/31/2023 by Alexandria Nancyann BRAVO, MD.  Called Nurse Triage reporting Toe Injury.  Symptoms began today.  Interventions attempted: OTC medications: Tylenol  and ibuprofen  combined pill.  Triage Disposition: See HCP Within 4 Hours (Or PCP Triage)  Patient/caregiver understands and will follow disposition?: Yes      Copied from CRM #[id number]. Topic: Clinical - Red Word Triage >> Feb 27, 2024 10:21 AM Lonell PEDLAR wrote: Red Word that prompted transfer to Nurse Triage: Patient fell this morning believes to have broken  her toe Reason for Disposition  [1] SEVERE pain (e.g., excruciating) AND [2] not improved 2 hours after pain medicine/ice packs  Answer Assessment - Initial Assessment Questions Pt went to podiatrist today (Dr. Verta). Pt states she broke all of the toes on her right foot. Pt has a boot for her right foot. This RN scheduled pt for an appt on Fri, 11/21, with PCP. If PCP is able to prescribe something for pain before Fri, please send to:  [id number] [place] [place] - [address] [place] [address] Phone: [phone]  Fax: [phone]  This RN educated pt on home care, new-worsening symptoms, when to call back/seek emergent care. Pt verbalized understanding and agrees to plan.   MECHANISM: How did the injury happen?      Pt fell this morning after tripping over bed  PAIN: Is there pain? If Yes, ask: How bad is the pain?   What does it keep you from doing? (Scale 0-10; or none, mild, moderate, severe)     Yes, 8/10 pain level constant; no pain medications given but pt was prescribed Vitamin D , Ergocalciferol , (DRISDOL ) 1.25 MG (50000 UNIT) CAPS capsule  Protocols used: Toe Injury-A-AH

## 2024-02-28 NOTE — Progress Notes (Signed)
 She presents today states that she fell this morning and she is hurting really bad at the base of toes #2 3 and 4 of her right foot.  She states that the pain is excruciating.  Objective: Vital signs are stable alert oriented x 3 patient walks with antalgic gait.  Ecchymosis across the entire forefoot with edema.  Pulses are palpable.  She has good range of motion of the toes though painful.  Radiographs taken today demonstrate fractures distal metatarsals 2nd, 3rd, 4th and 5th nondisplaced not comminuted.  Assessment: Fracture metatarsals 2 through 5.  Plan: I start a cam boot instructed her on elevation and staying off this is much as possible.  Instructed her not to walk without the cam boot.

## 2024-02-29 ENCOUNTER — Ambulatory Visit: Admitting: Family Medicine

## 2024-03-05 ENCOUNTER — Ambulatory Visit

## 2024-03-06 ENCOUNTER — Other Ambulatory Visit: Payer: Self-pay | Admitting: Family Medicine

## 2024-03-06 DIAGNOSIS — W19XXXA Unspecified fall, initial encounter: Secondary | ICD-10-CM

## 2024-03-06 DIAGNOSIS — M79601 Pain in right arm: Secondary | ICD-10-CM

## 2024-03-06 DIAGNOSIS — M79621 Pain in right upper arm: Secondary | ICD-10-CM

## 2024-03-19 ENCOUNTER — Ambulatory Visit: Payer: Self-pay

## 2024-03-19 NOTE — Telephone Encounter (Signed)
 FYI Only or Action Required?: FYI only for provider: appointment scheduled on 12.11.25.  Patient was last seen in primary care on 12/31/2023 by Gasper Nancyann BRAVO, MD.  Called Nurse Triage reporting Vomiting.  Symptoms began several months ago.  Interventions attempted: Dietary changes.  Symptoms are: gradually worsening.  Triage Disposition: See Physician Within 24 Hours  Patient/caregiver understands and will follow disposition?: Yes

## 2024-03-19 NOTE — Telephone Encounter (Signed)
 Reason for Triage: Pt eats food then burps and throws up Phlegm, Pt would like to be seen by provider. Please call pt back [phone]        Reason for Disposition  [1] MILD or MODERATE vomiting AND [2] present > 48 hours (2 days)  (Exception: Mild vomiting with associated diarrhea.)  Answer Assessment - Initial Assessment Questions While eating, pt belches and then gags on phlegm and then throws up. She states she doesn't throw up any food she just ate, its all clear phlegm. States on thanksgiving she took 2 spoonfuls of food and this started and she threw up for 30 minutes. Has been going on for about 2 months. She was controlling it with small bites but she states that has stopped working and it's any time she eats. She has not lost weight, is only eating 1-2 times a day to avoid this, but it happens any time she eats. She is able to swallow liquids without this happening. Denies any blood in vomit. States it is just clear. Has been having normal bms. Denies all higher acuity symptoms. Pt does say she has COPD, no worse than normal.     1. VOMITING SEVERITY: How many times have you vomited in the past 24 hours?      1-2 times a day 2. ONSET: When did the vomiting begin?      2 months 3. FLUIDS: What fluids or food have you vomited up today? Have you been able to keep any fluids down?     yes 4. ABDOMEN PAIN: Are your having any abdomen pain? If Yes : How bad is it and what does it feel like? (e.g., crampy, dull, intermittent, constant)      denies 5. DIARRHEA: Is there any diarrhea? If Yes, ask: How many times today?      denies 6. CAUSE: What do you think is causing your vomiting?     Unsure. 7. HYDRATION STATUS: Any signs of dehydration? (e.g., dry mouth [not only dry lips], too weak to stand) When did you last urinate?     No signs 8 OTHER SYMPTOMS: Do you have any other symptoms? (e.g., fever, headache, vertigo, vomiting blood or coffee grounds, recent  head injury)     denies  Protocols used: Vomiting-A-AH

## 2024-03-19 NOTE — Telephone Encounter (Signed)
 Copied from CRM #[id number]. Topic: Clinical - Pink Word Triage >> Mar 19, 2024 10:25 AM Carlyon D wrote: Pt eats food then burps and throws up Phlegm, Pt would like to be seen by provider. Please call pt back [phone]  See previous NT: appt already scheduled

## 2024-03-20 ENCOUNTER — Ambulatory Visit: Payer: Self-pay

## 2024-03-20 ENCOUNTER — Ambulatory Visit: Admitting: Family Medicine

## 2024-03-20 NOTE — Telephone Encounter (Signed)
 FYI Only or Action Required?: FYI only for provider: appointment scheduled on 12/15.  Patient was last seen in primary care on 12/31/2023 by Gasper Nancyann BRAVO, MD.  Called Nurse Triage reporting Appointment.    Triage Disposition: Information or Advice Only Call  Patient/caregiver understands and will follow disposition?: Yes       Message from Darshell M sent at 03/20/2024 11:46 AM EST  Reason for Triage: Patient has appointment at 1 pm after previous NT vomiting. Patient hasn't eaten but now has uncontrolled diarrhea since last night. She had one applesauce cup during the day. Patient does believe she get in the car for the appointment. Patient CB# [phone]   Reason for Disposition  Health information question, no triage required and triager able to answer question  Answer Assessment - Initial Assessment Questions 1. REASON FOR CALL: What is the main reason for your call? or How can I best help you?   This RN called patient back to discuss concerns of diarrhea. Pt. Stated it has subsided since taking Pepto Bismul today. She missed her 12/11 appointment for today, but plans on attending her previously scheduled 12/15 appt. With PCP.  Protocols used: Information Only Call - No Triage-A-AH

## 2024-03-20 NOTE — Progress Notes (Deleted)
 Established patient visit   Patient: Alexandria Rodriguez   DOB: Jul 12, 1950   73 y.o. Female  MRN: [id number] Visit Date: 03/20/2024  Today's healthcare provider: Rockie Agent, MD   No chief complaint on file.  Subjective       Discussed the use of AI scribe software for clinical note transcription with the patient, who gave verbal consent to proceed.  History of Present Illness      Past Medical History:  Diagnosis Date   Anxiety    Cataract    Cerebrovascular disease    Chronic pain    back pain   Contact dermatitis    COPD (chronic obstructive pulmonary disease) (HCC)    Depression    Diverticulosis 10/31/2010   On Tranverse colon   GERD (gastroesophageal reflux disease)    Humerus fracture 04/10/2016   Hyperlipidemia    Myasthenia gravis (HCC) 02/2013   Diagnosed by doctor Maree   Neuropathy    Osteoporosis    Stroke [facility])    Vitamin D  deficiency     Medications: Show/hide medication list[1]  Review of Systems  {Insert previous labs (optional):23779} {See past labs  Heme  Chem  Endocrine  Serology  Results Review (optional):1}   Objective    There were no vitals taken for this visit. {Insert last BP/Wt (optional):23777}{See vitals history (optional):1}    Physical Exam  ***  No results found for any visits on 03/20/24.  Assessment & Plan     Problem List Items Addressed This Visit   None   Assessment and Plan Assessment & Plan      No follow-ups on file.         Rockie Agent, MD  [facility] [phone] (phone) [phone] (fax)  [facility]    [1]  Outpatient Medications Prior to Visit  Medication Sig   carisoprodol  (SOMA ) 350 MG tablet Take 1 tablet (350 mg total) by mouth 2 (two) times daily.   Cholecalciferol  (VITAMIN D3) 125 MCG (5000 UT) TABS Take 5,000 Units by mouth daily. (Patient not taking: Reported on 12/31/2023)   clopidogrel  (PLAVIX ) 75  MG tablet TAKE 1 TABLET(75 MG) BY MOUTH DAILY   DULoxetine  (CYMBALTA ) 20 MG capsule TAKE 2 CAPSULES BY MOUTH EVERY DAY   fluticasone -salmeterol (WIXELA INHUB) 250-50 MCG/ACT AEPB INHALE 1 PUFF INTO THE LUNGS TWICE DAILY IN THE MORNING AND AT BEDTIME   gabapentin  (NEURONTIN ) 100 MG capsule TAKE 2 CAPSULES(200 MG) BY MOUTH THREE TIMES DAILY IN ADDITION TO 600 MG GABAPENTIN  TABLETS   gabapentin  (NEURONTIN ) 600 MG tablet TAKE 1 TABLET BY MOUTH TWICE A DAY   ibuprofen  (ADVIL ) 400 MG tablet Take 1 tablet (400 mg total) by mouth every 6 (six) hours as needed for headache, moderate pain, cramping or mild pain.   lansoprazole  (PREVACID ) 30 MG capsule Take 1 capsule (30 mg total) by mouth 2 (two) times daily before a meal.   meloxicam  (MOBIC ) 7.5 MG tablet TAKE 1 TABLET BY MOUTH DAILY AS NEEDED FOR PAIN   mupirocin  ointment (BACTROBAN ) 2 % APPLY TOPICALLY TO THE AFFECTED AREA TWICE DAILY   nortriptyline  (PAMELOR ) 50 MG capsule TAKE 1 CAPSULE BY MOUTH AT BEDTIME.   nystatin  (MYCOSTATIN /NYSTOP ) powder Apply 1 Application topically 3 (three) times daily.   rosuvastatin  (CRESTOR ) 20 MG tablet TAKE 1 TABLET(20 MG) BY MOUTH DAILY   triamcinolone  ointment (KENALOG ) 0.1 % APPLY EXTERNALLY TO THE AFFECTED AREA TWICE DAILY AS NEEDED   Vitamin D , Ergocalciferol , (DRISDOL )  1.25 MG (50000 UNIT) CAPS capsule Take 1 capsule (50,000 Units total) by mouth every 7 (seven) days.   Facility-Administered Medications Prior to Visit  Medication Dose Route Frequency Provider   cyanocobalamin  (VITAMIN B12) injection 1,000 mcg  1,000 mcg Intramuscular Once Gasper Nancyann BRAVO, MD

## 2024-03-24 ENCOUNTER — Encounter: Payer: Self-pay | Admitting: Family Medicine

## 2024-03-24 ENCOUNTER — Ambulatory Visit: Admitting: Family Medicine

## 2024-03-24 VITALS — BP 94/61 | HR 95 | Wt 137.0 lb

## 2024-03-24 DIAGNOSIS — E538 Deficiency of other specified B group vitamins: Secondary | ICD-10-CM

## 2024-03-24 DIAGNOSIS — E559 Vitamin D deficiency, unspecified: Secondary | ICD-10-CM

## 2024-03-24 DIAGNOSIS — R296 Repeated falls: Secondary | ICD-10-CM

## 2024-03-24 DIAGNOSIS — G20A1 Parkinson's disease without dyskinesia, without mention of fluctuations: Secondary | ICD-10-CM | POA: Insufficient documentation

## 2024-03-24 DIAGNOSIS — Z800 Family history of malignant neoplasm of digestive organs: Secondary | ICD-10-CM

## 2024-03-24 NOTE — Progress Notes (Signed)
 Established patient visit   Patient: Alexandria Rodriguez   DOB: July 06, 1950   73 y.o. Female  MRN: [id number] Visit Date: 03/24/2024  Today's healthcare provider: Nancyann Perry, MD   Chief Complaint  Patient presents with   Follow-up   Subjective    Discussed the use of AI scribe software for clinical note transcription with the patient, who gave verbal consent to proceed.  History of Present Illness   Alexandria Rodriguez is a 73 year old female who presents for follow-up on vitamin B12 and vitamin D  deficiency.  She was started on vitamin B12 injections in September but has not returned for subsequent injections since then. She is not taking any B12 supplements. She is taking vitamin D  supplements at a dose of 1000 mg daily.  She felt well until she broke all her toes, which has impacted her mobility. She is currently wearing a boot and is scheduled to see a foot doctor on Thursday. She experienced two falls in her bedroom the week she injured her foot, caused by her foot catching on the bottom rail of her bed, which is covered by a comforter. During one fall, she sustained bruising to her chin and damage to her glasses, which required repair.  She has a family of colon cancer and was advised by GI to colonoscopy done every 5 years. The last was done in 2017 by Dr. Geryl.      Lab Results  Component Value Date   VITAMINB12 205 (L) 12/31/2023   Lab Results  Component Value Date   VD25OH 5.7 (L) 12/31/2023     Medications: Show/hide medication list[1]      Objective    BP 94/61 (BP Location: Left Arm, Patient Position: Sitting, Cuff Size: Normal)   Pulse 95   Wt 137 lb (62.1 kg)   SpO2 98%   BMI 24.27 kg/m   Physical Exam   General appearance: Well developed, well nourished female, cooperative and in no acute distress Head: Normocephalic, without obvious abnormality, atraumatic Respiratory: Respirations even and unlabored, normal respiratory rate Extremities: All  extremities are intact.  Skin: Skin color, texture, turgor normal. No rashes seen  Psych: Appropriate mood and affect. Neurologic: Mental status: Alert, oriented to person, place, and time, thought content appropriate.    Assessment & Plan        Vitamin B12 deficiency Last B12 injection was in September but had intended for her to return for monthly injections. Not current taking oral supplements.   - Ordered B12 level test.  Vitamin D  deficiency Managed with daily vitamin D  supplementation of 1000 mg. Discussed importance of maintaining adequate vitamin D  levels for overall health. - Ordered vitamin D  level test.  Repeated falls Experienced repeated falls, including two in her bedroom resulting in a broken foot and chin bruising. Falls attributed to catching foot on comforter-covered bottom rail. No new falls reported aside from those related to the foot injury. - Continue follow-up with foot specialist Dr. Verta.  Family history of colon cancer Due for colonoscopy as it has been approximately eight years since the last one and had been advised by Dr. Geryl to have done Q5 years.  - Referred to gastroenterologist for colonoscopy.         Nancyann Perry, MD  [facility] [phone] (phone) [phone] (fax)  [facility]    [1]  Outpatient Medications Prior to Visit  Medication Sig   carisoprodol  (SOMA ) 350 MG tablet  Take 1 tablet (350 mg total) by mouth 2 (two) times daily.   Cholecalciferol  (VITAMIN D3) 125 MCG (5000 UT) TABS Take 5,000 Units by mouth daily.   clopidogrel  (PLAVIX ) 75 MG tablet TAKE 1 TABLET(75 MG) BY MOUTH DAILY   DULoxetine  (CYMBALTA ) 20 MG capsule TAKE 2 CAPSULES BY MOUTH EVERY DAY   fluticasone -salmeterol (WIXELA INHUB) 250-50 MCG/ACT AEPB INHALE 1 PUFF INTO THE LUNGS TWICE DAILY IN THE MORNING AND AT BEDTIME   gabapentin  (NEURONTIN ) 100 MG capsule TAKE 2 CAPSULES(200 MG) BY MOUTH THREE TIMES DAILY IN ADDITION  TO 600 MG GABAPENTIN  TABLETS   gabapentin  (NEURONTIN ) 600 MG tablet TAKE 1 TABLET BY MOUTH TWICE A DAY   ibuprofen  (ADVIL ) 400 MG tablet Take 1 tablet (400 mg total) by mouth every 6 (six) hours as needed for headache, moderate pain, cramping or mild pain.   lansoprazole  (PREVACID ) 30 MG capsule Take 1 capsule (30 mg total) by mouth 2 (two) times daily before a meal.   meloxicam  (MOBIC ) 7.5 MG tablet TAKE 1 TABLET BY MOUTH DAILY AS NEEDED FOR PAIN   mupirocin  ointment (BACTROBAN ) 2 % APPLY TOPICALLY TO THE AFFECTED AREA TWICE DAILY   nortriptyline  (PAMELOR ) 50 MG capsule TAKE 1 CAPSULE BY MOUTH AT BEDTIME.   nystatin  (MYCOSTATIN /NYSTOP ) powder Apply 1 Application topically 3 (three) times daily.   rosuvastatin  (CRESTOR ) 20 MG tablet TAKE 1 TABLET(20 MG) BY MOUTH DAILY   triamcinolone  ointment (KENALOG ) 0.1 % APPLY EXTERNALLY TO THE AFFECTED AREA TWICE DAILY AS NEEDED   Vitamin D , Ergocalciferol , (DRISDOL ) 1.25 MG (50000 UNIT) CAPS capsule Take 1 capsule (50,000 Units total) by mouth every 7 (seven) days.   Facility-Administered Medications Prior to Visit  Medication Dose Route Frequency Provider   cyanocobalamin  (VITAMIN B12) injection 1,000 mcg  1,000 mcg Intramuscular Once Gasper Nancyann BRAVO, MD

## 2024-03-25 ENCOUNTER — Ambulatory Visit: Payer: Self-pay | Admitting: Family Medicine

## 2024-03-25 ENCOUNTER — Ambulatory Visit: Admitting: Family Medicine

## 2024-03-25 LAB — VITAMIN D 25 HYDROXY (VIT D DEFICIENCY, FRACTURES): Vit D, 25-Hydroxy: 4.6 ng/mL — ABNORMAL LOW (ref 30.0–100.0)

## 2024-03-25 LAB — VITAMIN B12: Vitamin B-12: 363 pg/mL (ref 232–1245)

## 2024-03-25 MED ORDER — VITAMIN D3 50 MCG (2000 UT) PO CAPS: 2000.0000 [IU] | ORAL_CAPSULE | Freq: Every day | ORAL | Status: AC

## 2024-03-26 ENCOUNTER — Ambulatory Visit (INDEPENDENT_AMBULATORY_CARE_PROVIDER_SITE_OTHER)

## 2024-03-26 ENCOUNTER — Ambulatory Visit: Admitting: Podiatry

## 2024-03-26 DIAGNOSIS — S92324A Nondisplaced fracture of second metatarsal bone, right foot, initial encounter for closed fracture: Secondary | ICD-10-CM

## 2024-03-26 NOTE — Progress Notes (Signed)
 She presents today for follow-up of her fracture foot.  She fractured it this about a month or so ago.  She states that she has a walking boot ever since.  Objective: Vitals are stable alert oriented x 3 there is no erythema edema cellulitis drainage or odor her foot alignment is excellent at this point digital alignment is excellent.  Radiographs taken today demonstrate approximately 50% healing of the distal fractures of the second the third the 4th and 5th metatarsals.  There is no displacement of the capital fragments.  Assessment: Well-healing fracture foot right.  Plan: Placed her back in her cam boot for at least another 3 weeks I will follow-up with her in 1 month

## 2024-04-04 ENCOUNTER — Other Ambulatory Visit: Payer: Self-pay | Admitting: Family Medicine

## 2024-04-23 ENCOUNTER — Ambulatory Visit: Admitting: Podiatry

## 2024-04-30 ENCOUNTER — Ambulatory Visit: Admitting: Podiatry

## 2024-05-02 ENCOUNTER — Other Ambulatory Visit: Payer: Self-pay | Admitting: Family Medicine

## 2024-05-21 ENCOUNTER — Ambulatory Visit: Admitting: Podiatry

## 2024-10-21 ENCOUNTER — Ambulatory Visit (INDEPENDENT_AMBULATORY_CARE_PROVIDER_SITE_OTHER): Admitting: Nurse Practitioner

## 2024-10-21 ENCOUNTER — Encounter (INDEPENDENT_AMBULATORY_CARE_PROVIDER_SITE_OTHER)
